# Patient Record
Sex: Male | Born: 1945
Health system: Southern US, Community
[De-identification: ages and names within clinical notes are randomized; demographics above are authoritative.]

## PROBLEM LIST (undated history)

## (undated) DIAGNOSIS — M1990 Unspecified osteoarthritis, unspecified site: Secondary | ICD-10-CM

## (undated) DIAGNOSIS — K219 Gastro-esophageal reflux disease without esophagitis: Secondary | ICD-10-CM

## (undated) DIAGNOSIS — T4145XA Adverse effect of unspecified anesthetic, initial encounter: Secondary | ICD-10-CM

## (undated) DIAGNOSIS — I480 Paroxysmal atrial fibrillation: Secondary | ICD-10-CM

## (undated) DIAGNOSIS — I2510 Atherosclerotic heart disease of native coronary artery without angina pectoris: Secondary | ICD-10-CM

## (undated) DIAGNOSIS — G4733 Obstructive sleep apnea (adult) (pediatric): Secondary | ICD-10-CM

## (undated) DIAGNOSIS — I420 Dilated cardiomyopathy: Secondary | ICD-10-CM

## (undated) DIAGNOSIS — R9389 Abnormal findings on diagnostic imaging of other specified body structures: Secondary | ICD-10-CM

## (undated) DIAGNOSIS — E785 Hyperlipidemia, unspecified: Secondary | ICD-10-CM

## (undated) DIAGNOSIS — D649 Anemia, unspecified: Secondary | ICD-10-CM

## (undated) DIAGNOSIS — T8859XA Other complications of anesthesia, initial encounter: Secondary | ICD-10-CM

## (undated) DIAGNOSIS — R0600 Dyspnea, unspecified: Secondary | ICD-10-CM

## (undated) DIAGNOSIS — I509 Heart failure, unspecified: Secondary | ICD-10-CM

## (undated) DIAGNOSIS — I499 Cardiac arrhythmia, unspecified: Secondary | ICD-10-CM

## (undated) DIAGNOSIS — H409 Unspecified glaucoma: Secondary | ICD-10-CM

## (undated) HISTORY — DX: Paroxysmal atrial fibrillation: I48.0

## (undated) HISTORY — PX: RETINAL DETACHMENT SURGERY: SHX105

## (undated) HISTORY — DX: Atherosclerotic heart disease of native coronary artery without angina pectoris: I25.10

## (undated) HISTORY — DX: Hyperlipidemia, unspecified: E78.5

## (undated) HISTORY — PX: VASECTOMY: SHX75

## (undated) HISTORY — PX: EYE SURGERY: SHX253

## (undated) HISTORY — PX: KNEE ARTHROSCOPY W/ DEBRIDEMENT: SHX1867

## (undated) HISTORY — DX: Obstructive sleep apnea (adult) (pediatric): G47.33

## (undated) HISTORY — DX: Abnormal findings on diagnostic imaging of other specified body structures: R93.89

## (undated) HISTORY — DX: Dilated cardiomyopathy: I42.0

---

## 1998-06-18 ENCOUNTER — Ambulatory Visit: Admission: RE | Admit: 1998-06-18 | Discharge: 1998-06-18 | Payer: Self-pay | Admitting: Family Medicine

## 2000-01-11 ENCOUNTER — Ambulatory Visit (HOSPITAL_COMMUNITY): Admission: RE | Admit: 2000-01-11 | Discharge: 2000-01-11 | Payer: Self-pay | Admitting: Family Medicine

## 2000-06-21 ENCOUNTER — Other Ambulatory Visit: Admission: RE | Admit: 2000-06-21 | Discharge: 2000-06-21 | Payer: Self-pay | Admitting: Urology

## 2002-02-06 ENCOUNTER — Ambulatory Visit (HOSPITAL_COMMUNITY): Admission: RE | Admit: 2002-02-06 | Discharge: 2002-02-06 | Payer: Self-pay | Admitting: Family Medicine

## 2003-10-09 ENCOUNTER — Ambulatory Visit (HOSPITAL_COMMUNITY): Admission: RE | Admit: 2003-10-09 | Discharge: 2003-10-10 | Payer: Self-pay | Admitting: Ophthalmology

## 2003-10-18 ENCOUNTER — Ambulatory Visit (HOSPITAL_COMMUNITY): Admission: RE | Admit: 2003-10-18 | Discharge: 2003-10-20 | Payer: Self-pay | Admitting: Ophthalmology

## 2006-06-16 ENCOUNTER — Inpatient Hospital Stay (HOSPITAL_COMMUNITY): Admission: EM | Admit: 2006-06-16 | Discharge: 2006-06-19 | Payer: Self-pay | Admitting: Emergency Medicine

## 2011-11-24 DIAGNOSIS — Z961 Presence of intraocular lens: Secondary | ICD-10-CM | POA: Insufficient documentation

## 2011-11-24 DIAGNOSIS — H4010X0 Unspecified open-angle glaucoma, stage unspecified: Secondary | ICD-10-CM | POA: Insufficient documentation

## 2011-11-24 DIAGNOSIS — H33033 Retinal detachment with giant retinal tear, bilateral: Secondary | ICD-10-CM | POA: Insufficient documentation

## 2012-12-19 ENCOUNTER — Telehealth: Payer: Self-pay | Admitting: *Deleted

## 2012-12-19 NOTE — Telephone Encounter (Signed)
error 

## 2015-03-05 ENCOUNTER — Emergency Department (HOSPITAL_COMMUNITY): Payer: PPO

## 2015-03-05 ENCOUNTER — Encounter (HOSPITAL_COMMUNITY): Payer: Self-pay | Admitting: Emergency Medicine

## 2015-03-05 ENCOUNTER — Emergency Department (HOSPITAL_COMMUNITY)
Admission: EM | Admit: 2015-03-05 | Discharge: 2015-03-05 | Disposition: A | Discharge status: Home / Self Care | Payer: PPO | Attending: Emergency Medicine | Admitting: Emergency Medicine

## 2015-03-05 DIAGNOSIS — Z880 Allergy status to penicillin: Secondary | ICD-10-CM | POA: Diagnosis not present

## 2015-03-05 DIAGNOSIS — R5383 Other fatigue: Secondary | ICD-10-CM | POA: Insufficient documentation

## 2015-03-05 DIAGNOSIS — R42 Dizziness and giddiness: Secondary | ICD-10-CM | POA: Diagnosis present

## 2015-03-05 DIAGNOSIS — Z7951 Long term (current) use of inhaled steroids: Secondary | ICD-10-CM | POA: Insufficient documentation

## 2015-03-05 DIAGNOSIS — Z79899 Other long term (current) drug therapy: Secondary | ICD-10-CM | POA: Diagnosis not present

## 2015-03-05 DIAGNOSIS — N390 Urinary tract infection, site not specified: Secondary | ICD-10-CM | POA: Diagnosis not present

## 2015-03-05 DIAGNOSIS — Z7982 Long term (current) use of aspirin: Secondary | ICD-10-CM | POA: Insufficient documentation

## 2015-03-05 DIAGNOSIS — H409 Unspecified glaucoma: Secondary | ICD-10-CM | POA: Diagnosis not present

## 2015-03-05 HISTORY — DX: Unspecified glaucoma: H40.9

## 2015-03-05 LAB — HEPATIC FUNCTION PANEL
ALT: 16 U/L — AB (ref 17–63)
AST: 22 U/L (ref 15–41)
Albumin: 3.8 g/dL (ref 3.5–5.0)
Alkaline Phosphatase: 63 U/L (ref 38–126)
BILIRUBIN DIRECT: 0.2 mg/dL (ref 0.1–0.5)
BILIRUBIN INDIRECT: 0.6 mg/dL (ref 0.3–0.9)
BILIRUBIN TOTAL: 0.8 mg/dL (ref 0.3–1.2)
Total Protein: 6.9 g/dL (ref 6.5–8.1)

## 2015-03-05 LAB — URINALYSIS, ROUTINE W REFLEX MICROSCOPIC
BILIRUBIN URINE: NEGATIVE
Glucose, UA: NEGATIVE mg/dL
KETONES UR: NEGATIVE mg/dL
Nitrite: NEGATIVE
PH: 5.5 (ref 5.0–8.0)
Protein, ur: NEGATIVE mg/dL
SPECIFIC GRAVITY, URINE: 1.02 (ref 1.005–1.030)
UROBILINOGEN UA: 0.2 mg/dL (ref 0.0–1.0)

## 2015-03-05 LAB — CBC
HCT: 40.8 % (ref 39.0–52.0)
HEMOGLOBIN: 13.5 g/dL (ref 13.0–17.0)
MCH: 29.9 pg (ref 26.0–34.0)
MCHC: 33.1 g/dL (ref 30.0–36.0)
MCV: 90.3 fL (ref 78.0–100.0)
PLATELETS: 208 10*3/uL (ref 150–400)
RBC: 4.52 MIL/uL (ref 4.22–5.81)
RDW: 13.9 % (ref 11.5–15.5)
WBC: 16.3 10*3/uL — ABNORMAL HIGH (ref 4.0–10.5)

## 2015-03-05 LAB — BASIC METABOLIC PANEL
ANION GAP: 7 (ref 5–15)
BUN: 21 mg/dL — ABNORMAL HIGH (ref 6–20)
CALCIUM: 8.9 mg/dL (ref 8.9–10.3)
CO2: 24 mmol/L (ref 22–32)
CREATININE: 0.96 mg/dL (ref 0.61–1.24)
Chloride: 104 mmol/L (ref 101–111)
Glucose, Bld: 108 mg/dL — ABNORMAL HIGH (ref 65–99)
Potassium: 4.1 mmol/L (ref 3.5–5.1)
SODIUM: 135 mmol/L (ref 135–145)

## 2015-03-05 LAB — I-STAT TROPONIN, ED
TROPONIN I, POC: 0 ng/mL (ref 0.00–0.08)
TROPONIN I, POC: 0 ng/mL (ref 0.00–0.08)
Troponin i, poc: 0 ng/mL (ref 0.00–0.08)

## 2015-03-05 LAB — URINE MICROSCOPIC-ADD ON

## 2015-03-05 LAB — LIPASE, BLOOD: Lipase: 21 U/L — ABNORMAL LOW (ref 22–51)

## 2015-03-05 MED ORDER — CEPHALEXIN 500 MG PO CAPS: 500.0000 mg | ORAL_CAPSULE | Freq: Four times a day (QID) | ORAL | Status: DC

## 2015-03-05 MED ORDER — SODIUM CHLORIDE 0.9 % IV SOLN
Freq: Once | INTRAVENOUS | Status: AC
  Administered 2015-03-05: 18:00:00 via INTRAVENOUS

## 2015-03-05 MED ORDER — SODIUM CHLORIDE 0.9 % IV BOLUS (SEPSIS)
500.0000 mL | Freq: Once | INTRAVENOUS | Status: AC
  Administered 2015-03-05: 500 mL via INTRAVENOUS

## 2015-03-05 MED ORDER — SODIUM CHLORIDE 0.9 % IV BOLUS (SEPSIS)
1000.0000 mL | Freq: Once | INTRAVENOUS | Status: AC
  Administered 2015-03-05: 1000 mL via INTRAVENOUS

## 2015-03-05 MED ORDER — IOHEXOL 350 MG/ML SOLN: 100.0000 mL | Freq: Once | INTRAVENOUS | Status: DC | PRN

## 2015-03-05 MED ORDER — DEXTROSE 5 % IV SOLN
1.0000 g | Freq: Once | INTRAVENOUS | Status: AC
  Administered 2015-03-05: 1 g via INTRAVENOUS
  Filled 2015-03-05: qty 10

## 2015-03-05 NOTE — ED Notes (Signed)
Patient transported to CT 

## 2015-03-05 NOTE — ED Notes (Signed)
Pt hypotensive 95/51 when measured in right arm, normal blood pressure 122/61 when measured in left arm. Pt denies cardiac history but states he has borderline hyperlipidemia. Pt had blood pressure measured at PCP today when he went in for evaluation of what he believed to be UTI symptoms: anxiety, dizziness, 100 degree F fever, shoulder aches, back pain.

## 2015-03-05 NOTE — Discharge Instructions (Signed)
Urinary Tract Infection Your urine was found to be infected.  Take the antibiotic as prescribed and follow up outpatient as soon as possible.  It is unclear what happened with your blood pressure today but it normalized on its own.  It did appear that you were dehydrated and improved with fluids.  Take your antibiotic, rest, and drink as much fluid as possible.  Return with worsening symptoms or new concerning symptoms.  Make a follow up appointment for tomorrow for reevaluation.  Urinary tract infections (UTIs) can develop anywhere along your urinary tract. Your urinary tract is your body's drainage system for removing wastes and extra water. Your urinary tract includes two kidneys, two ureters, a bladder, and a urethra. Your kidneys are a pair of bean-shaped organs. Each kidney is about the size of your fist. They are located below your ribs, one on each side of your spine. CAUSES Infections are caused by microbes, which are microscopic organisms, including fungi, viruses, and bacteria. These organisms are so small that they can only be seen through a microscope. Bacteria are the microbes that most commonly cause UTIs. SYMPTOMS  Symptoms of UTIs may vary by age and gender of the patient and by the location of the infection. Symptoms in young women typically include a frequent and intense urge to urinate and a painful, burning feeling in the bladder or urethra during urination. Older women and men are more likely to be tired, shaky, and weak and have muscle aches and abdominal pain. A fever may mean the infection is in your kidneys. Other symptoms of a kidney infection include pain in your back or sides below the ribs, nausea, and vomiting. DIAGNOSIS To diagnose a UTI, your caregiver will ask you about your symptoms. Your caregiver also will ask to provide a urine sample. The urine sample will be tested for bacteria and white blood cells. White blood cells are made by your body to help fight  infection. TREATMENT  Typically, UTIs can be treated with medication. Because most UTIs are caused by a bacterial infection, they usually can be treated with the use of antibiotics. The choice of antibiotic and length of treatment depend on your symptoms and the type of bacteria causing your infection. HOME CARE INSTRUCTIONS  If you were prescribed antibiotics, take them exactly as your caregiver instructs you. Finish the medication even if you feel better after you have only taken some of the medication.  Drink enough water and fluids to keep your urine clear or pale yellow.  Avoid caffeine, tea, and carbonated beverages. They tend to irritate your bladder.  Empty your bladder often. Avoid holding urine for long periods of time.  Empty your bladder before and after sexual intercourse.  After a bowel movement, women should cleanse from front to back. Use each tissue only once. SEEK MEDICAL CARE IF:   You have back pain.  You develop a fever.  Your symptoms do not begin to resolve within 3 days. SEEK IMMEDIATE MEDICAL CARE IF:   You have severe back pain or lower abdominal pain.  You develop chills.  You have nausea or vomiting.  You have continued burning or discomfort with urination. MAKE SURE YOU:   Understand these instructions.  Will watch your condition.  Will get help right away if you are not doing well or get worse. Document Released: 03/02/2005 Document Revised: 11/22/2011 Document Reviewed: 07/01/2011 [facility] Patient Information 2015 [place], [place]. This information is not intended to replace advice given to you by your health  care provider. Make sure you discuss any questions you have with your health care provider.

## 2015-03-05 NOTE — Progress Notes (Signed)
EDCm spoke to patient at bedside.  Patient confirms his pcp is Dr. Myriam Jacobson of [facility]  System updated.

## 2015-03-07 NOTE — ED Provider Notes (Signed)
CSN: [id number]     Arrival date & time 03/05/15  1525 History   First MD Initiated Contact with Patient 03/05/15 1619     Chief Complaint  Patient presents with  . Hypotension  . Dizziness     (Consider location/radiation/quality/duration/timing/severity/associated sxs/prior Treatment) HPI Comments: [name] with history of UTI presents for low blood pressure.  The patient reports that last night he was up during the night with mild discomfort in his lower back that he said feels like when he had urinary tract infections before.  He says that it was difficult to sleep because of this and that he went to his urologist's office to try to get treatment for possible UTI but was found to have a low blood pressure and was sent to the ER.  He reports lightheadedness today when he gets up to stand but denies fever.  No nausea or vomiting.  He denies flank pain.  No chest pain or shortness of breath.  No focal weakness or neurologic changes.  Patient is a 69 y.o. male presenting with dizziness.  Dizziness Associated symptoms: no chest pain, no diarrhea, no headaches, no nausea, no palpitations, no vomiting and no weakness     Past Medical History  Diagnosis Date  . Glaucoma    History reviewed. No pertinent past surgical history. History reviewed. No pertinent family history. Social History  Substance Use Topics  . Smoking status: Never Smoker   . Smokeless tobacco: None  . Alcohol Use: Yes     Comment: occasional    Review of Systems  Constitutional: Positive for fatigue. Negative for fever, chills and appetite change.  HENT: Negative for congestion, postnasal drip and rhinorrhea.   Eyes: Negative for pain and redness.  Respiratory: Negative for cough, chest tightness and wheezing.   Cardiovascular: Negative for chest pain and palpitations.  Gastrointestinal: Negative for nausea, vomiting, abdominal pain and diarrhea.  Genitourinary: Negative for dysuria, urgency and hematuria.   Musculoskeletal: Negative for myalgias, back pain and joint swelling.  Skin: Negative for rash.  Neurological: Positive for light-headedness. Negative for dizziness, seizures, speech difficulty, weakness, numbness and headaches.  Hematological: Does not bruise/bleed easily.      Allergies  Penicillins  Home Medications   Prior to Admission medications   Medication Sig Start Date End Date Taking? Authorizing Jona Erkkila  acetaminophen (TYLENOL) 500 MG tablet Take 1,000 mg by mouth every 6 (six) hours as needed for mild pain, moderate pain, fever or headache.   Yes Historical Maisyn Nouri, MD  aspirin 81 MG tablet Take 81 mg by mouth daily with breakfast.   Yes Historical Donnika Kucher, MD  brimonidine (ALPHAGAN) 0.15 % ophthalmic solution Place 1 drop into the right eye 2 (two) times daily.   Yes Historical Xandrea Clarey, MD  calcium carbonate (OS-CAL) 600 MG tablet Take 600 mg by mouth every evening.   Yes Historical Lenay Lovejoy, MD  cetirizine (ZYRTEC) 10 MG tablet Take 10 mg by mouth every evening.   Yes Historical Sanford Lindblad, MD  Cyanocobalamin (VITAMIN B-12 IJ) Inject as directed every 14 (fourteen) days.   Yes Historical Raiana Pharris, MD  ferrous sulfate 325 (65 FE) MG tablet Take 325 mg by mouth daily with breakfast.   Yes Historical Maddix Kliewer, MD  fluticasone (FLONASE) 50 MCG/ACT nasal spray Place 2 sprays into both nostrils at bedtime.   Yes Historical Jasmin Winberry, MD  Glucosamine 750 MG TABS Take 1 tablet by mouth 2 (two) times daily.   Yes Historical Kingson Lohmeyer, MD  ketorolac (ACULAR) 0.4 % SOLN  Place 1 drop into both eyes at bedtime.   Yes Historical Christia Coaxum, MD  Multiple Vitamins-Minerals (CENTRUM SILVER ADULT 50+) TABS Take 1 tablet by mouth daily with breakfast.   Yes Historical Jazae Gandolfi, MD  tamsulosin (FLOMAX) 0.4 MG CAPS capsule Take 0.4 mg by mouth daily after supper.   Yes Historical Marlyn Tondreau, MD  cephALEXin (KEFLEX) 500 MG capsule Take 1 capsule (500 mg total) by mouth 4 (four) times daily. 03/05/15    Harvel Quale, MD   BP 119/61 mmHg  Pulse 89  Temp(Src) 97.8 F (36.6 C) (Oral)  Resp 16  Ht 6' (1.829 m)  Wt 265 lb (120.203 kg)  BMI 35.93 kg/m2  SpO2 95% Physical Exam  Constitutional: He is oriented to person, place, and time. He appears well-developed and well-nourished. No distress.  HENT:  Head: Normocephalic and atraumatic.  Right Ear: External ear normal.  Left Ear: External ear normal.  Mouth/Throat: Oropharynx is clear and moist. No oropharyngeal exudate.  Eyes: EOM are normal. Pupils are equal, round, and reactive to light.  Neck: Normal range of motion. Neck supple.  Cardiovascular: Normal rate, regular rhythm, normal heart sounds and intact distal pulses.   No murmur heard. Pulses:      Radial pulses are 2+ on the right side, and 2+ on the left side.  Pulmonary/Chest: Effort normal. No respiratory distress. He has no wheezes. He has no rales.  Abdominal: Soft. He exhibits no distension. There is no tenderness. There is no rebound.  Musculoskeletal: He exhibits no edema or tenderness.  Neurological: He is alert and oriented to person, place, and time. He has normal strength. No cranial nerve deficit or sensory deficit. He exhibits normal muscle tone. Coordination normal.  Skin: Skin is warm and dry. No rash noted. He is not diaphoretic.  Vitals reviewed.   ED Course  Procedures (including critical care time) Labs Review Labs Reviewed  BASIC METABOLIC PANEL - Abnormal; Notable for the following:    Glucose, Bld 108 (*)    BUN 21 (*)    All other components within normal limits  CBC - Abnormal; Notable for the following:    WBC 16.3 (*)    All other components within normal limits  LIPASE, BLOOD - Abnormal; Notable for the following:    Lipase 21 (*)    All other components within normal limits  HEPATIC FUNCTION PANEL - Abnormal; Notable for the following:    ALT 16 (*)    All other components within normal limits  URINALYSIS, ROUTINE W REFLEX  MICROSCOPIC (NOT AT [facility]) - Abnormal; Notable for the following:    Hgb urine dipstick TRACE (*)    Leukocytes, UA MODERATE (*)    All other components within normal limits  URINE MICROSCOPIC-ADD ON  I-STAT TROPOININ, ED  I-STAT TROPOININ, ED  I-STAT TROPOININ, ED    Imaging Review Ct Angio Abdomen W/cm &/or Wo Contrast  03/05/2015   CLINICAL DATA:  69 year old male with chronic wall blood pressure in the upper extremities. Anxiety and dizziness. 100 degree fever. Shoulder and back pain.  EXAM: CT ANGIOGRAPHY CHEST AND ABDOMEN  TECHNIQUE: Multidetector CT imaging of the chest and abdomen was performed using the standard protocol during bolus administration of intravenous contrast. Multiplanar CT image reconstructions and MIPs were obtained to evaluate the vascular anatomy.  CONTRAST:  100 mL of Omnipaque 350.  COMPARISON:  No priors.  FINDINGS: CTA CHEST FINDINGS  Mediastinum/Lymph Nodes: Heart size is normal. There is no significant pericardial fluid, thickening or  pericardial calcification. There is atherosclerosis of the thoracic aorta, the great vessels of the mediastinum and the coronary arteries, including calcified atherosclerotic plaque in the left main, left anterior descending and right coronary arteries. No acute abnormality of the thoracic aorta; specifically, no aneurysm or dissection. Additionally, there is no crescentic high attenuation associated with the wall of the thoracic aorta on precontrast images to suggest acute intramural hemorrhage. No mediastinal or hilar lymphadenopathy. Esophagus is unremarkable in appearance. No axillary lymphadenopathy.  Lungs/Pleura: Some dependent areas of subsegmental atelectasis and/or scarring are noted in the lower lobes of the lungs bilaterally. There is no acute consolidative airspace disease. No pleural effusions. No suspicious appearing pulmonary nodules or masses.  Musculoskeletal/Soft Tissues: There are no aggressive appearing lytic or blastic  lesions noted in the visualized portions of the skeleton.  Review of the MIP images confirms the above findings.  CTA ABDOMEN FINDINGS  Hepatobiliary: No cystic or solid hepatic lesions. No intra or extrahepatic biliary ductal dilatation. Gallbladder is normal in appearance.  Pancreas: No pancreatic mass. No pancreatic ductal dilatation. No pancreatic or peripancreatic fluid or inflammatory changes.  Spleen: Unremarkable.  Adrenals/Urinary Tract: Multifocal cortical thinning in the kidneys bilaterally (right greater than left), compatible with areas of post infectious or inflammatory scarring. Low-attenuation lesions in the kidneys bilaterally, compatible with simple cysts, the largest of which measures 3.8 cm in the lower pole of the left kidney. No suspicious renal lesions are noted. Bilateral adrenal glands are normal in appearance. No hydroureteronephrosis in the visualized abdomen.  Stomach/Bowel: Normal appearance of the stomach. No pathologic dilatation of the visualized portions of small bowel or colon. Normal appendix.  Vascular/Lymphatic: Atherosclerosis throughout the abdominal vasculature, without evidence of aneurysm or dissection.  Other: No significant volume of ascites in the visualized peritoneal cavity. No pneumoperitoneum.  Musculoskeletal: There are no aggressive appearing lytic or blastic lesions noted in the visualized portions of the skeleton.  Review of the MIP images confirms the above findings.  IMPRESSION: 1. No acute abnormality of the thoracoabdominal aorta. Specifically, no aneurysm or dissection. 2. Atherosclerosis, including left main and 2 vessel coronary artery disease. Please note that although the presence of coronary artery calcium documents the presence of coronary artery disease, the severity of this disease and any potential stenosis cannot be assessed on this non-gated CT examination. Assessment for potential risk factor modification, dietary therapy or pharmacologic therapy  may be warranted, if clinically indicated. 3. No acute findings in the chest or abdomen to account for the patient's symptoms. 4. Multifocal cortical thinning in the kidneys bilaterally (right greater than left), presumably areas of post infectious or inflammatory scarring. 5. Normal appendix. 6. Additional incidental findings, as above.   Electronically Signed   By: Vinnie Langton M.D.   On: 03/05/2015 18:34   Dg Chest Portable 1 View  03/05/2015   CLINICAL DATA:  Dizziness, anxiety and elevated blood pressure  EXAM: PORTABLE CHEST - 1 VIEW  COMPARISON:  10/09/03  FINDINGS: Cardiac shadow is within normal limits. The lungs are well aerated bilaterally. Some minimal atelectasis is noted in the lateral left base. No focal confluent infiltrate is seen. No bony abnormality is noted.  IMPRESSION: Mild left basilar atelectasis.   Electronically Signed   By: Inez Catalina M.D.   On: 03/05/2015 17:32   Ct Angio Chest Aorta W/cm &/or Wo/cm  03/05/2015   CLINICAL DATA:  69 year old male with chronic wall blood pressure in the upper extremities. Anxiety and dizziness. 100 degree fever. Shoulder and back  pain.  EXAM: CT ANGIOGRAPHY CHEST AND ABDOMEN  TECHNIQUE: Multidetector CT imaging of the chest and abdomen was performed using the standard protocol during bolus administration of intravenous contrast. Multiplanar CT image reconstructions and MIPs were obtained to evaluate the vascular anatomy.  CONTRAST:  100 mL of Omnipaque 350.  COMPARISON:  No priors.  FINDINGS: CTA CHEST FINDINGS  Mediastinum/Lymph Nodes: Heart size is normal. There is no significant pericardial fluid, thickening or pericardial calcification. There is atherosclerosis of the thoracic aorta, the great vessels of the mediastinum and the coronary arteries, including calcified atherosclerotic plaque in the left main, left anterior descending and right coronary arteries. No acute abnormality of the thoracic aorta; specifically, no aneurysm or dissection.  Additionally, there is no crescentic high attenuation associated with the wall of the thoracic aorta on precontrast images to suggest acute intramural hemorrhage. No mediastinal or hilar lymphadenopathy. Esophagus is unremarkable in appearance. No axillary lymphadenopathy.  Lungs/Pleura: Some dependent areas of subsegmental atelectasis and/or scarring are noted in the lower lobes of the lungs bilaterally. There is no acute consolidative airspace disease. No pleural effusions. No suspicious appearing pulmonary nodules or masses.  Musculoskeletal/Soft Tissues: There are no aggressive appearing lytic or blastic lesions noted in the visualized portions of the skeleton.  Review of the MIP images confirms the above findings.  CTA ABDOMEN FINDINGS  Hepatobiliary: No cystic or solid hepatic lesions. No intra or extrahepatic biliary ductal dilatation. Gallbladder is normal in appearance.  Pancreas: No pancreatic mass. No pancreatic ductal dilatation. No pancreatic or peripancreatic fluid or inflammatory changes.  Spleen: Unremarkable.  Adrenals/Urinary Tract: Multifocal cortical thinning in the kidneys bilaterally (right greater than left), compatible with areas of post infectious or inflammatory scarring. Low-attenuation lesions in the kidneys bilaterally, compatible with simple cysts, the largest of which measures 3.8 cm in the lower pole of the left kidney. No suspicious renal lesions are noted. Bilateral adrenal glands are normal in appearance. No hydroureteronephrosis in the visualized abdomen.  Stomach/Bowel: Normal appearance of the stomach. No pathologic dilatation of the visualized portions of small bowel or colon. Normal appendix.  Vascular/Lymphatic: Atherosclerosis throughout the abdominal vasculature, without evidence of aneurysm or dissection.  Other: No significant volume of ascites in the visualized peritoneal cavity. No pneumoperitoneum.  Musculoskeletal: There are no aggressive appearing lytic or blastic  lesions noted in the visualized portions of the skeleton.  Review of the MIP images confirms the above findings.  IMPRESSION: 1. No acute abnormality of the thoracoabdominal aorta. Specifically, no aneurysm or dissection. 2. Atherosclerosis, including left main and 2 vessel coronary artery disease. Please note that although the presence of coronary artery calcium documents the presence of coronary artery disease, the severity of this disease and any potential stenosis cannot be assessed on this non-gated CT examination. Assessment for potential risk factor modification, dietary therapy or pharmacologic therapy may be warranted, if clinically indicated. 3. No acute findings in the chest or abdomen to account for the patient's symptoms. 4. Multifocal cortical thinning in the kidneys bilaterally (right greater than left), presumably areas of post infectious or inflammatory scarring. 5. Normal appendix. 6. Additional incidental findings, as above.   Electronically Signed   By: Vinnie Langton M.D.   On: 03/05/2015 18:34   I have personally reviewed and evaluated these images and lab results as part of my medical decision-making.   EKG Interpretation   Date/Time:  Thursday March 05 2015 15:43:29 EDT Ventricular Rate:  72 PR Interval:  187 QRS Duration: 99 QT Interval:  382  QTC Calculation: 418 R Axis:   -36 Text Interpretation:  Sinus rhythm Left axis deviation Low voltage,  precordial leads No significant change since last tracing Confirmed by  NGUYEN, EMILY ([id number]) on 03/05/2015 4:33:04 PM      MDM  Patient was seen and evaluated in stable condition.  BP normalized on its own although noted to have asymmetric blood pressure in triage and be complaining of light headedness and back discomfort that kept him from sleeping well last night.  CTA negative for dissection/aneurysm but some degree of CAD noted - patient denied chest pain.  EKG and troponin x2 unremarkable.  Laboratory studies revealed  leukocytosis and UTI.  Otherwise unremarkable.  Patient given fluids for hydration and Rocephin for UTI.  After fluids patient was stood and ambulated without difficulty and denied lightheadedness.  Blood pressure remained stable and heart rate went to max of 90s when the patient stood.  Discussed all results and clinical impression with patient and wife at bedside.  Both felt comfortable with plan for discharge on antibiotics and outpatient follow up.  Patient instructed to hydrate well and to return immediately with new concerning symptoms or worsening symptoms.  All questions answered prior to discharge. Final diagnoses:  UTI (lower urinary tract infection)    1. UTI    Harvel Quale, MD 03/07/15 1555

## 2015-03-18 ENCOUNTER — Telehealth: Payer: Self-pay | Admitting: Cardiovascular Disease

## 2015-03-18 NOTE — Telephone Encounter (Signed)
Received records from [place] for appointment on 05/05/15 with Dr Gwenlyn Found.  Records given to [facility] (medical records) for Dr Kennon Holter schedule on 05/05/15. lp

## 2015-05-05 ENCOUNTER — Encounter: Payer: Self-pay | Admitting: Cardiovascular Disease

## 2015-05-05 ENCOUNTER — Ambulatory Visit (INDEPENDENT_AMBULATORY_CARE_PROVIDER_SITE_OTHER): Payer: PPO | Admitting: Cardiovascular Disease

## 2015-05-05 VITALS — BP 136/82 | HR 63 | Ht 72.0 in | Wt 264.0 lb

## 2015-05-05 DIAGNOSIS — R931 Abnormal findings on diagnostic imaging of heart and coronary circulation: Secondary | ICD-10-CM | POA: Insufficient documentation

## 2015-05-05 DIAGNOSIS — E785 Hyperlipidemia, unspecified: Secondary | ICD-10-CM | POA: Insufficient documentation

## 2015-05-05 DIAGNOSIS — R938 Abnormal findings on diagnostic imaging of other specified body structures: Secondary | ICD-10-CM | POA: Diagnosis not present

## 2015-05-05 NOTE — Patient Instructions (Signed)
Medication Instructions:  Your physician recommends that you continue on your current medications as directed. Please refer to the Current Medication list given to you today.   Labwork: none  Testing/Procedures: none  Follow-Up: Follow up with Dr. Berry as needed.   Any Other Special Instructions Will Be Listed Below (If Applicable).     If you need a refill on your cardiac medications before your next appointment, please call your pharmacy.   

## 2015-05-05 NOTE — Assessment & Plan Note (Signed)
[name]  had incidentally noted calcium in the left main and RCA found on chest CT imaging done for other reasons.his only cardiac risk factor is mild hyperlipidemia. He is totally asymptomatic. At this point I do not feel he needs functional testing but will see him back as needed should he develop other symptoms and I would have a low threshold to perform functional testing at that time

## 2015-05-05 NOTE — Progress Notes (Signed)
05/05/2015 Joshua Moran   May 10, 1946  [id number]  Primary Physician  Melinda Crutch, MD Primary Cardiologist: Lorretta Harp MD Renae Gloss   HPI:  Joshua Moran is a 69 year old moderately overweight married Caucasian male father of 3 children, grandfather of 3 grandchildren referred by Dr. Melinda Crutch for cardiovascular evaluation because of coronary calcium found on chest CT done for other reasons. He is retired Teacher, English as a foreign language for [employer]. His only cardiac risk factor is mild hyperlipidemia with statin intolerance. There is no family history. He has never smoked. He has never had a heart attack or stroke. He denies chest pain but does have mild dyspnea on exertion probably related to poor exercise tolerance from inactivity. He was seen at the hospital during a UTI and has apparently had asymmetric upper extremity blood pressures. A CT scan done of his chest suggested that he had calcium in his left main and RCA.   Current Outpatient Prescriptions  Medication Sig Dispense Refill  . acetaminophen (TYLENOL) 500 MG tablet Take 1,000 mg by mouth every 6 (six) hours as needed for mild pain, moderate pain, fever or headache.    Marland Kitchen aspirin 81 MG tablet Take 81 mg by mouth daily with breakfast.    . brimonidine (ALPHAGAN) 0.15 % ophthalmic solution Place 1 drop into the right eye 2 (two) times daily.    . calcium carbonate (OS-CAL) 600 MG tablet Take 600 mg by mouth every evening.    . cetirizine (ZYRTEC) 10 MG tablet Take 10 mg by mouth every evening.    . Cyanocobalamin (VITAMIN B-12 IJ) Inject as directed every 14 (fourteen) days.    . ferrous sulfate 325 (65 FE) MG tablet Take 325 mg by mouth daily with breakfast.    . fluticasone (FLONASE) 50 MCG/ACT nasal spray Place 2 sprays into both nostrils at bedtime.    . Glucosamine 750 MG TABS Take 1 tablet by mouth 2 (two) times daily.    Marland Kitchen ketorolac (ACULAR) 0.4 % SOLN Place 1 drop into both eyes at bedtime.    . Multiple  Vitamins-Minerals (CENTRUM SILVER ADULT 50+) TABS Take 1 tablet by mouth daily with breakfast.    . tamsulosin (FLOMAX) 0.4 MG CAPS capsule Take 0.4 mg by mouth daily after supper.     No current facility-administered medications for this visit.    Allergies  Allergen Reactions  . Penicillins Other (See Comments)    Very flushed  Has patient had a PCN reaction causing immediate rash, facial/tongue/throat swelling, SOB or lightheadedness with hypotension: No Has patient had a PCN reaction causing severe rash involving mucus membranes or skin necrosis: No Has patient had a PCN reaction that required hospitalization No Has patient had a PCN reaction occurring within the last 10 years: No If all of the above answers are "NO", then may proceed with Cephalosporin use.     Social History   Social History  . Marital Status: Married    Spouse Name: N/A  . Number of Children: N/A  . Years of Education: N/A   Occupational History  . Not on file.   Social History Main Topics  . Smoking status: Never Smoker   . Smokeless tobacco: Not on file  . Alcohol Use: Yes     Comment: occasional  . Drug Use: No  . Sexual Activity: Not on file   Other Topics Concern  . Not on file   Social History Narrative     Review of Systems: General: negative  for chills, fever, night sweats or weight changes.  Cardiovascular: negative for chest pain, dyspnea on exertion, edema, orthopnea, palpitations, paroxysmal nocturnal dyspnea or shortness of breath Dermatological: negative for rash Respiratory: negative for cough or wheezing Urologic: negative for hematuria Abdominal: negative for nausea, vomiting, diarrhea, bright red blood per rectum, melena, or hematemesis Neurologic: negative for visual changes, syncope, or dizziness All other systems reviewed and are otherwise negative except as noted above.    Blood pressure 136/82, pulse 63, height 6' (1.829 m), weight 264 lb (119.75 kg).  General  appearance: alert and no distress Neck: no adenopathy, no carotid bruit, no JVD, supple, symmetrical, trachea midline and thyroid not enlarged, symmetric, no tenderness/mass/nodules Lungs: clear to auscultation bilaterally Heart: regular rate and rhythm, S1, S2 normal, no murmur, click, rub or gallop Extremities: extremities normal, atraumatic, no cyanosis or edema    EKG: Not performed today   ASSESSMENT AND PLAN:   Hyperlipidemia History of mild hyperlipidemia intolerant to statin therapy followed by his PCP  Abnormal CT scan of heart Mr. [name]  had incidentally noted calcium in the left main and RCA found on chest CT imaging done for other reasons.his only cardiac risk factor is mild hyperlipidemia. He is totally asymptomatic. At this point I do not feel he needs functional testing but will see him back as needed should he develop other symptoms and I would have a low threshold to perform functional testing at that time      Lorretta Harp MD [facility], [facility] 05/05/2015 10:36 AM

## 2015-05-05 NOTE — Assessment & Plan Note (Signed)
History of mild hyperlipidemia intolerant to statin therapy followed by his PCP

## 2015-05-07 ENCOUNTER — Telehealth: Payer: Self-pay

## 2015-05-07 DIAGNOSIS — R9389 Abnormal findings on diagnostic imaging of other specified body structures: Secondary | ICD-10-CM

## 2015-05-07 NOTE — Telephone Encounter (Signed)
Left message for patient to call back.   Dr. Gwenlyn Found would like pt to have an exercise Myoview to rule out CAD.  He would like to do this test to make sure that the coronary calcium seen is not obstructive to your heart. In meantime keep with risk prevention like diet and exercise.  Orders in system for Myoview.

## 2015-05-08 NOTE — Telephone Encounter (Signed)
Pt agrees to have Myoview completed.   Pt would like after appointment when it is scheduled. He is aware that someone will be calling him to schedule.  No additional questions at this time.

## 2015-05-08 NOTE — Telephone Encounter (Signed)
Patient is returning your call from yesterday.

## 2015-05-15 ENCOUNTER — Telehealth (HOSPITAL_COMMUNITY): Payer: Self-pay

## 2015-05-15 NOTE — Telephone Encounter (Signed)
Encounter complete. 

## 2015-05-19 ENCOUNTER — Telehealth (HOSPITAL_COMMUNITY): Payer: Self-pay

## 2015-05-19 NOTE — Telephone Encounter (Signed)
Encounter complete. 

## 2015-05-20 ENCOUNTER — Ambulatory Visit (HOSPITAL_COMMUNITY)
Admission: RE | Admit: 2015-05-20 | Discharge: 2015-05-20 | Disposition: A | Discharge status: Home / Self Care | Payer: PPO | Source: Ambulatory Visit | Attending: Cardiology | Admitting: Cardiology

## 2015-05-20 DIAGNOSIS — Z8249 Family history of ischemic heart disease and other diseases of the circulatory system: Secondary | ICD-10-CM | POA: Insufficient documentation

## 2015-05-20 DIAGNOSIS — R0609 Other forms of dyspnea: Secondary | ICD-10-CM | POA: Insufficient documentation

## 2015-05-20 DIAGNOSIS — R938 Abnormal findings on diagnostic imaging of other specified body structures: Secondary | ICD-10-CM | POA: Insufficient documentation

## 2015-05-20 DIAGNOSIS — R9439 Abnormal result of other cardiovascular function study: Secondary | ICD-10-CM | POA: Insufficient documentation

## 2015-05-20 DIAGNOSIS — E669 Obesity, unspecified: Secondary | ICD-10-CM | POA: Insufficient documentation

## 2015-05-20 DIAGNOSIS — R9389 Abnormal findings on diagnostic imaging of other specified body structures: Secondary | ICD-10-CM

## 2015-05-20 DIAGNOSIS — Z6835 Body mass index (BMI) 35.0-35.9, adult: Secondary | ICD-10-CM | POA: Insufficient documentation

## 2015-05-20 LAB — MYOCARDIAL PERFUSION IMAGING
CHL CUP MPHR: 150 {beats}/min
CHL CUP NUCLEAR SDS: 0
CHL CUP NUCLEAR SRS: 9
CHL CUP RESTING HR STRESS: 77 {beats}/min
CHL RATE OF PERCEIVED EXERTION: 15
CSEPEW: 7 METS
CSEPHR: 94 %
Exercise duration (min): 5 min
LV dias vol: 157 mL
LV sys vol: 77 mL
Peak HR: 141 {beats}/min
SSS: 9
TID: 1.09

## 2015-05-20 MED ORDER — TECHNETIUM TC 99M SESTAMIBI GENERIC - CARDIOLITE
31.5000 | Freq: Once | INTRAVENOUS | Status: AC | PRN
  Administered 2015-05-20: 31.5 via INTRAVENOUS

## 2015-05-20 MED ORDER — TECHNETIUM TC 99M SESTAMIBI GENERIC - CARDIOLITE
10.9000 | Freq: Once | INTRAVENOUS | Status: AC | PRN
  Administered 2015-05-20: 10.9 via INTRAVENOUS

## 2015-06-10 DIAGNOSIS — H401113 Primary open-angle glaucoma, right eye, severe stage: Secondary | ICD-10-CM | POA: Diagnosis not present

## 2015-06-10 DIAGNOSIS — H401114 Primary open-angle glaucoma, right eye, indeterminate stage: Secondary | ICD-10-CM | POA: Insufficient documentation

## 2015-06-10 DIAGNOSIS — Z961 Presence of intraocular lens: Secondary | ICD-10-CM | POA: Diagnosis not present

## 2015-06-17 DIAGNOSIS — Z86010 Personal history of colonic polyps: Secondary | ICD-10-CM | POA: Diagnosis not present

## 2015-06-24 DIAGNOSIS — D649 Anemia, unspecified: Secondary | ICD-10-CM | POA: Diagnosis not present

## 2015-06-24 DIAGNOSIS — Z0000 Encounter for general adult medical examination without abnormal findings: Secondary | ICD-10-CM | POA: Diagnosis not present

## 2015-06-24 DIAGNOSIS — E7800 Pure hypercholesterolemia, unspecified: Secondary | ICD-10-CM | POA: Diagnosis not present

## 2015-06-24 DIAGNOSIS — J309 Allergic rhinitis, unspecified: Secondary | ICD-10-CM | POA: Diagnosis not present

## 2015-06-24 DIAGNOSIS — Z79899 Other long term (current) drug therapy: Secondary | ICD-10-CM | POA: Diagnosis not present

## 2015-06-24 DIAGNOSIS — N529 Male erectile dysfunction, unspecified: Secondary | ICD-10-CM | POA: Diagnosis not present

## 2015-06-24 DIAGNOSIS — M179 Osteoarthritis of knee, unspecified: Secondary | ICD-10-CM | POA: Diagnosis not present

## 2015-06-30 DIAGNOSIS — D649 Anemia, unspecified: Secondary | ICD-10-CM | POA: Diagnosis not present

## 2015-06-30 DIAGNOSIS — M179 Osteoarthritis of knee, unspecified: Secondary | ICD-10-CM | POA: Diagnosis not present

## 2015-06-30 DIAGNOSIS — Z79899 Other long term (current) drug therapy: Secondary | ICD-10-CM | POA: Diagnosis not present

## 2015-06-30 DIAGNOSIS — E7800 Pure hypercholesterolemia, unspecified: Secondary | ICD-10-CM | POA: Diagnosis not present

## 2015-06-30 DIAGNOSIS — J309 Allergic rhinitis, unspecified: Secondary | ICD-10-CM | POA: Diagnosis not present

## 2015-06-30 DIAGNOSIS — Z0000 Encounter for general adult medical examination without abnormal findings: Secondary | ICD-10-CM | POA: Diagnosis not present

## 2015-07-27 DIAGNOSIS — N3020 Other chronic cystitis without hematuria: Secondary | ICD-10-CM | POA: Diagnosis not present

## 2015-07-27 DIAGNOSIS — N401 Enlarged prostate with lower urinary tract symptoms: Secondary | ICD-10-CM | POA: Diagnosis not present

## 2015-07-27 DIAGNOSIS — N138 Other obstructive and reflux uropathy: Secondary | ICD-10-CM | POA: Diagnosis not present

## 2015-07-27 DIAGNOSIS — Z0000 Encounter for general adult medical examination without abnormal findings: Secondary | ICD-10-CM | POA: Diagnosis not present

## 2015-07-27 DIAGNOSIS — N3941 Urge incontinence: Secondary | ICD-10-CM | POA: Diagnosis not present

## 2015-08-17 DIAGNOSIS — N3281 Overactive bladder: Secondary | ICD-10-CM | POA: Diagnosis not present

## 2015-08-17 DIAGNOSIS — Z0000 Encounter for general adult medical examination without abnormal findings: Secondary | ICD-10-CM | POA: Diagnosis not present

## 2015-08-17 DIAGNOSIS — R3915 Urgency of urination: Secondary | ICD-10-CM | POA: Diagnosis not present

## 2015-08-17 DIAGNOSIS — N3941 Urge incontinence: Secondary | ICD-10-CM | POA: Diagnosis not present

## 2015-09-04 DIAGNOSIS — B999 Unspecified infectious disease: Secondary | ICD-10-CM | POA: Diagnosis not present

## 2015-09-04 DIAGNOSIS — L600 Ingrowing nail: Secondary | ICD-10-CM | POA: Diagnosis not present

## 2015-09-07 DIAGNOSIS — N138 Other obstructive and reflux uropathy: Secondary | ICD-10-CM | POA: Diagnosis not present

## 2015-09-07 DIAGNOSIS — N401 Enlarged prostate with lower urinary tract symptoms: Secondary | ICD-10-CM | POA: Diagnosis not present

## 2015-09-07 DIAGNOSIS — N3941 Urge incontinence: Secondary | ICD-10-CM | POA: Diagnosis not present

## 2015-09-07 DIAGNOSIS — Z0000 Encounter for general adult medical examination without abnormal findings: Secondary | ICD-10-CM | POA: Diagnosis not present

## 2015-09-07 DIAGNOSIS — N1370 Vesicoureteral-reflux, unspecified: Secondary | ICD-10-CM | POA: Diagnosis not present

## 2015-09-25 DIAGNOSIS — M79674 Pain in right toe(s): Secondary | ICD-10-CM | POA: Diagnosis not present

## 2015-09-25 DIAGNOSIS — L03031 Cellulitis of right toe: Secondary | ICD-10-CM | POA: Diagnosis not present

## 2015-10-09 DIAGNOSIS — M79675 Pain in left toe(s): Secondary | ICD-10-CM | POA: Diagnosis not present

## 2015-10-09 DIAGNOSIS — M79674 Pain in right toe(s): Secondary | ICD-10-CM | POA: Diagnosis not present

## 2015-10-09 DIAGNOSIS — B351 Tinea unguium: Secondary | ICD-10-CM | POA: Diagnosis not present

## 2015-10-13 DIAGNOSIS — N1370 Vesicoureteral-reflux, unspecified: Secondary | ICD-10-CM | POA: Diagnosis not present

## 2015-10-13 DIAGNOSIS — N390 Urinary tract infection, site not specified: Secondary | ICD-10-CM | POA: Diagnosis not present

## 2015-10-13 DIAGNOSIS — B9620 Unspecified Escherichia coli [E. coli] as the cause of diseases classified elsewhere: Secondary | ICD-10-CM | POA: Diagnosis not present

## 2015-10-13 DIAGNOSIS — N3020 Other chronic cystitis without hematuria: Secondary | ICD-10-CM | POA: Diagnosis not present

## 2015-10-13 DIAGNOSIS — Z0000 Encounter for general adult medical examination without abnormal findings: Secondary | ICD-10-CM | POA: Diagnosis not present

## 2015-10-28 DIAGNOSIS — M170 Bilateral primary osteoarthritis of knee: Secondary | ICD-10-CM | POA: Diagnosis not present

## 2015-10-28 DIAGNOSIS — N3020 Other chronic cystitis without hematuria: Secondary | ICD-10-CM | POA: Diagnosis not present

## 2015-10-30 DIAGNOSIS — N390 Urinary tract infection, site not specified: Secondary | ICD-10-CM | POA: Diagnosis not present

## 2015-11-04 DIAGNOSIS — M170 Bilateral primary osteoarthritis of knee: Secondary | ICD-10-CM | POA: Diagnosis not present

## 2015-11-06 DIAGNOSIS — M79674 Pain in right toe(s): Secondary | ICD-10-CM | POA: Diagnosis not present

## 2015-11-06 DIAGNOSIS — M79675 Pain in left toe(s): Secondary | ICD-10-CM | POA: Diagnosis not present

## 2015-11-06 DIAGNOSIS — B351 Tinea unguium: Secondary | ICD-10-CM | POA: Diagnosis not present

## 2015-11-10 DIAGNOSIS — M170 Bilateral primary osteoarthritis of knee: Secondary | ICD-10-CM | POA: Diagnosis not present

## 2015-12-04 DIAGNOSIS — M79674 Pain in right toe(s): Secondary | ICD-10-CM | POA: Diagnosis not present

## 2015-12-04 DIAGNOSIS — B351 Tinea unguium: Secondary | ICD-10-CM | POA: Diagnosis not present

## 2015-12-04 DIAGNOSIS — M79675 Pain in left toe(s): Secondary | ICD-10-CM | POA: Diagnosis not present

## 2015-12-17 DIAGNOSIS — N401 Enlarged prostate with lower urinary tract symptoms: Secondary | ICD-10-CM | POA: Diagnosis not present

## 2015-12-23 DIAGNOSIS — N5201 Erectile dysfunction due to arterial insufficiency: Secondary | ICD-10-CM | POA: Diagnosis not present

## 2015-12-23 DIAGNOSIS — R972 Elevated prostate specific antigen [PSA]: Secondary | ICD-10-CM | POA: Diagnosis not present

## 2015-12-23 DIAGNOSIS — N401 Enlarged prostate with lower urinary tract symptoms: Secondary | ICD-10-CM | POA: Diagnosis not present

## 2015-12-23 DIAGNOSIS — R3915 Urgency of urination: Secondary | ICD-10-CM | POA: Diagnosis not present

## 2016-01-01 DIAGNOSIS — M79674 Pain in right toe(s): Secondary | ICD-10-CM | POA: Diagnosis not present

## 2016-01-01 DIAGNOSIS — M79675 Pain in left toe(s): Secondary | ICD-10-CM | POA: Diagnosis not present

## 2016-01-01 DIAGNOSIS — B351 Tinea unguium: Secondary | ICD-10-CM | POA: Diagnosis not present

## 2016-02-25 DIAGNOSIS — Z961 Presence of intraocular lens: Secondary | ICD-10-CM | POA: Diagnosis not present

## 2016-02-25 DIAGNOSIS — H33033 Retinal detachment with giant retinal tear, bilateral: Secondary | ICD-10-CM | POA: Diagnosis not present

## 2016-02-25 DIAGNOSIS — H401114 Primary open-angle glaucoma, right eye, indeterminate stage: Secondary | ICD-10-CM | POA: Diagnosis not present

## 2016-03-24 DIAGNOSIS — R972 Elevated prostate specific antigen [PSA]: Secondary | ICD-10-CM | POA: Diagnosis not present

## 2016-03-30 DIAGNOSIS — R972 Elevated prostate specific antigen [PSA]: Secondary | ICD-10-CM | POA: Diagnosis not present

## 2016-03-30 DIAGNOSIS — N5201 Erectile dysfunction due to arterial insufficiency: Secondary | ICD-10-CM | POA: Diagnosis not present

## 2016-03-30 DIAGNOSIS — N3941 Urge incontinence: Secondary | ICD-10-CM | POA: Diagnosis not present

## 2016-03-30 DIAGNOSIS — N401 Enlarged prostate with lower urinary tract symptoms: Secondary | ICD-10-CM | POA: Diagnosis not present

## 2016-04-01 DIAGNOSIS — M79674 Pain in right toe(s): Secondary | ICD-10-CM | POA: Diagnosis not present

## 2016-04-01 DIAGNOSIS — M79675 Pain in left toe(s): Secondary | ICD-10-CM | POA: Diagnosis not present

## 2016-04-01 DIAGNOSIS — B351 Tinea unguium: Secondary | ICD-10-CM | POA: Diagnosis not present

## 2016-05-12 DIAGNOSIS — J329 Chronic sinusitis, unspecified: Secondary | ICD-10-CM | POA: Diagnosis not present

## 2016-05-13 DIAGNOSIS — H401113 Primary open-angle glaucoma, right eye, severe stage: Secondary | ICD-10-CM | POA: Diagnosis not present

## 2016-05-19 DIAGNOSIS — L814 Other melanin hyperpigmentation: Secondary | ICD-10-CM | POA: Diagnosis not present

## 2016-05-19 DIAGNOSIS — D225 Melanocytic nevi of trunk: Secondary | ICD-10-CM | POA: Diagnosis not present

## 2016-05-19 DIAGNOSIS — L821 Other seborrheic keratosis: Secondary | ICD-10-CM | POA: Diagnosis not present

## 2016-05-19 DIAGNOSIS — D1801 Hemangioma of skin and subcutaneous tissue: Secondary | ICD-10-CM | POA: Diagnosis not present

## 2016-05-26 DIAGNOSIS — M170 Bilateral primary osteoarthritis of knee: Secondary | ICD-10-CM | POA: Diagnosis not present

## 2016-05-26 DIAGNOSIS — M25561 Pain in right knee: Secondary | ICD-10-CM | POA: Diagnosis not present

## 2016-05-26 DIAGNOSIS — M25562 Pain in left knee: Secondary | ICD-10-CM | POA: Diagnosis not present

## 2016-06-02 DIAGNOSIS — M170 Bilateral primary osteoarthritis of knee: Secondary | ICD-10-CM | POA: Diagnosis not present

## 2016-06-09 DIAGNOSIS — M170 Bilateral primary osteoarthritis of knee: Secondary | ICD-10-CM | POA: Diagnosis not present

## 2016-07-11 DIAGNOSIS — Z79899 Other long term (current) drug therapy: Secondary | ICD-10-CM | POA: Diagnosis not present

## 2016-07-11 DIAGNOSIS — M179 Osteoarthritis of knee, unspecified: Secondary | ICD-10-CM | POA: Diagnosis not present

## 2016-07-11 DIAGNOSIS — J309 Allergic rhinitis, unspecified: Secondary | ICD-10-CM | POA: Diagnosis not present

## 2016-07-11 DIAGNOSIS — Z0000 Encounter for general adult medical examination without abnormal findings: Secondary | ICD-10-CM | POA: Diagnosis not present

## 2016-07-11 DIAGNOSIS — D649 Anemia, unspecified: Secondary | ICD-10-CM | POA: Diagnosis not present

## 2016-07-11 DIAGNOSIS — J329 Chronic sinusitis, unspecified: Secondary | ICD-10-CM | POA: Diagnosis not present

## 2016-07-11 DIAGNOSIS — E7800 Pure hypercholesterolemia, unspecified: Secondary | ICD-10-CM | POA: Diagnosis not present

## 2016-07-11 DIAGNOSIS — Z6841 Body Mass Index (BMI) 40.0 and over, adult: Secondary | ICD-10-CM | POA: Diagnosis not present

## 2016-07-11 DIAGNOSIS — N529 Male erectile dysfunction, unspecified: Secondary | ICD-10-CM | POA: Diagnosis not present

## 2016-07-29 DIAGNOSIS — Z961 Presence of intraocular lens: Secondary | ICD-10-CM | POA: Diagnosis not present

## 2016-07-29 DIAGNOSIS — H401113 Primary open-angle glaucoma, right eye, severe stage: Secondary | ICD-10-CM | POA: Diagnosis not present

## 2016-08-09 DIAGNOSIS — R350 Frequency of micturition: Secondary | ICD-10-CM | POA: Diagnosis not present

## 2016-09-14 DIAGNOSIS — H401113 Primary open-angle glaucoma, right eye, severe stage: Secondary | ICD-10-CM | POA: Diagnosis not present

## 2016-11-14 DIAGNOSIS — J209 Acute bronchitis, unspecified: Secondary | ICD-10-CM | POA: Diagnosis not present

## 2016-12-22 DIAGNOSIS — M170 Bilateral primary osteoarthritis of knee: Secondary | ICD-10-CM | POA: Diagnosis not present

## 2016-12-28 DIAGNOSIS — M170 Bilateral primary osteoarthritis of knee: Secondary | ICD-10-CM | POA: Diagnosis not present

## 2017-01-02 DIAGNOSIS — H401113 Primary open-angle glaucoma, right eye, severe stage: Secondary | ICD-10-CM | POA: Diagnosis not present

## 2017-01-02 DIAGNOSIS — H52203 Unspecified astigmatism, bilateral: Secondary | ICD-10-CM | POA: Diagnosis not present

## 2017-01-02 DIAGNOSIS — Z961 Presence of intraocular lens: Secondary | ICD-10-CM | POA: Diagnosis not present

## 2017-01-02 DIAGNOSIS — H5213 Myopia, bilateral: Secondary | ICD-10-CM | POA: Diagnosis not present

## 2017-01-05 DIAGNOSIS — M170 Bilateral primary osteoarthritis of knee: Secondary | ICD-10-CM | POA: Diagnosis not present

## 2017-01-23 DIAGNOSIS — L814 Other melanin hyperpigmentation: Secondary | ICD-10-CM | POA: Diagnosis not present

## 2017-01-23 DIAGNOSIS — L821 Other seborrheic keratosis: Secondary | ICD-10-CM | POA: Diagnosis not present

## 2017-01-23 DIAGNOSIS — L570 Actinic keratosis: Secondary | ICD-10-CM | POA: Diagnosis not present

## 2017-01-23 DIAGNOSIS — D225 Melanocytic nevi of trunk: Secondary | ICD-10-CM | POA: Diagnosis not present

## 2017-02-28 DIAGNOSIS — R5383 Other fatigue: Secondary | ICD-10-CM | POA: Diagnosis not present

## 2017-02-28 DIAGNOSIS — Z23 Encounter for immunization: Secondary | ICD-10-CM | POA: Diagnosis not present

## 2017-02-28 DIAGNOSIS — H6122 Impacted cerumen, left ear: Secondary | ICD-10-CM | POA: Diagnosis not present

## 2017-02-28 DIAGNOSIS — H6120 Impacted cerumen, unspecified ear: Secondary | ICD-10-CM | POA: Diagnosis not present

## 2017-02-28 DIAGNOSIS — R634 Abnormal weight loss: Secondary | ICD-10-CM | POA: Diagnosis not present

## 2017-02-28 DIAGNOSIS — R6882 Decreased libido: Secondary | ICD-10-CM | POA: Diagnosis not present

## 2017-02-28 DIAGNOSIS — D649 Anemia, unspecified: Secondary | ICD-10-CM | POA: Diagnosis not present

## 2017-03-16 DIAGNOSIS — Z961 Presence of intraocular lens: Secondary | ICD-10-CM | POA: Diagnosis not present

## 2017-03-16 DIAGNOSIS — H33033 Retinal detachment with giant retinal tear, bilateral: Secondary | ICD-10-CM | POA: Diagnosis not present

## 2017-03-16 DIAGNOSIS — H401114 Primary open-angle glaucoma, right eye, indeterminate stage: Secondary | ICD-10-CM | POA: Diagnosis not present

## 2017-04-05 DIAGNOSIS — R972 Elevated prostate specific antigen [PSA]: Secondary | ICD-10-CM | POA: Diagnosis not present

## 2017-04-12 DIAGNOSIS — N5201 Erectile dysfunction due to arterial insufficiency: Secondary | ICD-10-CM | POA: Diagnosis not present

## 2017-04-12 DIAGNOSIS — N401 Enlarged prostate with lower urinary tract symptoms: Secondary | ICD-10-CM | POA: Diagnosis not present

## 2017-04-12 DIAGNOSIS — N3281 Overactive bladder: Secondary | ICD-10-CM | POA: Diagnosis not present

## 2017-04-12 DIAGNOSIS — R972 Elevated prostate specific antigen [PSA]: Secondary | ICD-10-CM | POA: Diagnosis not present

## 2017-04-18 DIAGNOSIS — M170 Bilateral primary osteoarthritis of knee: Secondary | ICD-10-CM | POA: Diagnosis not present

## 2017-08-03 DIAGNOSIS — M170 Bilateral primary osteoarthritis of knee: Secondary | ICD-10-CM | POA: Diagnosis not present

## 2017-08-08 DIAGNOSIS — N529 Male erectile dysfunction, unspecified: Secondary | ICD-10-CM | POA: Diagnosis not present

## 2017-08-08 DIAGNOSIS — E7800 Pure hypercholesterolemia, unspecified: Secondary | ICD-10-CM | POA: Diagnosis not present

## 2017-08-08 DIAGNOSIS — Z0000 Encounter for general adult medical examination without abnormal findings: Secondary | ICD-10-CM | POA: Diagnosis not present

## 2017-08-08 DIAGNOSIS — D649 Anemia, unspecified: Secondary | ICD-10-CM | POA: Diagnosis not present

## 2017-08-08 DIAGNOSIS — R634 Abnormal weight loss: Secondary | ICD-10-CM | POA: Diagnosis not present

## 2017-08-08 DIAGNOSIS — J309 Allergic rhinitis, unspecified: Secondary | ICD-10-CM | POA: Diagnosis not present

## 2017-08-08 DIAGNOSIS — M179 Osteoarthritis of knee, unspecified: Secondary | ICD-10-CM | POA: Diagnosis not present

## 2017-08-08 DIAGNOSIS — Z79899 Other long term (current) drug therapy: Secondary | ICD-10-CM | POA: Diagnosis not present

## 2017-08-08 DIAGNOSIS — R0602 Shortness of breath: Secondary | ICD-10-CM | POA: Diagnosis not present

## 2017-08-10 DIAGNOSIS — M1712 Unilateral primary osteoarthritis, left knee: Secondary | ICD-10-CM | POA: Diagnosis not present

## 2017-08-10 DIAGNOSIS — M1711 Unilateral primary osteoarthritis, right knee: Secondary | ICD-10-CM | POA: Diagnosis not present

## 2017-08-17 DIAGNOSIS — M1712 Unilateral primary osteoarthritis, left knee: Secondary | ICD-10-CM | POA: Diagnosis not present

## 2017-08-17 DIAGNOSIS — M170 Bilateral primary osteoarthritis of knee: Secondary | ICD-10-CM | POA: Diagnosis not present

## 2017-08-17 DIAGNOSIS — M1711 Unilateral primary osteoarthritis, right knee: Secondary | ICD-10-CM | POA: Diagnosis not present

## 2017-08-23 DIAGNOSIS — L570 Actinic keratosis: Secondary | ICD-10-CM | POA: Diagnosis not present

## 2017-08-23 DIAGNOSIS — L821 Other seborrheic keratosis: Secondary | ICD-10-CM | POA: Diagnosis not present

## 2017-08-23 DIAGNOSIS — L814 Other melanin hyperpigmentation: Secondary | ICD-10-CM | POA: Diagnosis not present

## 2017-08-23 DIAGNOSIS — D225 Melanocytic nevi of trunk: Secondary | ICD-10-CM | POA: Diagnosis not present

## 2017-08-23 DIAGNOSIS — D1801 Hemangioma of skin and subcutaneous tissue: Secondary | ICD-10-CM | POA: Diagnosis not present

## 2017-08-23 DIAGNOSIS — L578 Other skin changes due to chronic exposure to nonionizing radiation: Secondary | ICD-10-CM | POA: Diagnosis not present

## 2017-09-05 ENCOUNTER — Ambulatory Visit: Payer: PPO | Admitting: Pulmonary Disease

## 2017-09-05 ENCOUNTER — Encounter: Payer: Self-pay | Admitting: Pulmonary Disease

## 2017-09-05 ENCOUNTER — Ambulatory Visit (INDEPENDENT_AMBULATORY_CARE_PROVIDER_SITE_OTHER)
Admission: RE | Admit: 2017-09-05 | Discharge: 2017-09-05 | Disposition: A | Discharge status: Home / Self Care | Payer: PPO | Source: Ambulatory Visit | Attending: Pulmonary Disease | Admitting: Pulmonary Disease

## 2017-09-05 VITALS — BP 136/78 | HR 80 | Ht 71.5 in | Wt 278.0 lb

## 2017-09-05 DIAGNOSIS — R0600 Dyspnea, unspecified: Secondary | ICD-10-CM

## 2017-09-05 DIAGNOSIS — R0609 Other forms of dyspnea: Secondary | ICD-10-CM | POA: Diagnosis not present

## 2017-09-05 NOTE — Patient Instructions (Signed)
Shortness of breath: As we described today is many different causes for this.  We will start by evaluating your lungs to make sure there is no problem there. Lung function test Chest x-ray Ambulatory oximetry monitoring If these tests are normal then on the next visit we will consider an echocardiogram and checking a blood test called a proBNP  Overweight: I think you need to exercise more and try to lose weight The following behaviors have been associated with weight loss: Weigh yourself daily Write down everything you eat Drink a glass of water prior to eating a meal Only eat when you are hungry Buy food from the periphery of the grocery store, not the middle

## 2017-09-05 NOTE — Progress Notes (Signed)
Subjective:   PATIENT ID: Joshua Moran GENDER: male DOB: 06/21/1945, MRN: [id number]  Synopsis: Referred in April 2019 for Dyspnea  HPI  Chief Complaint  Patient presents with  . Consult    referred by Dr. Harrington Challenger for worsening SOB Xseveral years.    Joshua Moran complains of shortness of breath: > occurs with "physical anything" and has been present for several years > makes him limit his yard work, climbing stairs/hills > he can carry in groceries > when he is short of breath he says he breathe hard > no chest tightness  > no wheezing > he thinks it is slowly worsening  He goes to the [employer] "sporadically" > he targets 3 days a week but he doesn't always do this > he rides the recumbant bike for 30 minutes and uses other weights   Cough: > he has a "tickle in his throat" > has been present for many years  GERD: > worse when he "doesn't eat right" > has been told he has a hiatal hernia > well controlled with diet > takes nexium  Anemia: > he has struggled with this for years > he has been on iron for years > he doesn't know why he needs the iron  His mother smoked, he died of lung cancer.    He never smoked.  Has never been told he had asthma.  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  He was exposed to cotton dust a lot over 35 years, off and on through the day.    Past Medical History:  Diagnosis Date  . Abnormal screening CT of chest    coronary calcium in left main and RCA  . Glaucoma   . Hyperlipidemia    statin intolerant     Family History  Problem Relation Age of Onset  . Lung cancer Mother        smoker     Social History   Socioeconomic History  . Marital status: Married    Spouse name: Not on file  . Number of children: Not on file  . Years of education: Not on file  . Highest education level: Not on file  Occupational History  . Not on file  Social Needs  . Financial resource strain: Not on file  . Food insecurity:    Worry: Not on  file    Inability: Not on file  . Transportation needs:    Medical: Not on file    Non-medical: Not on file  Tobacco Use  . Smoking status: Never Smoker  . Smokeless tobacco: Never Used  Substance and Sexual Activity  . Alcohol use: Yes    Comment: occasional  . Drug use: No  . Sexual activity: Not on file  Lifestyle  . Physical activity:    Days per week: Not on file    Minutes per session: Not on file  . Stress: Not on file  Relationships  . Social connections:    Talks on phone: Not on file    Gets together: Not on file    Attends religious service: Not on file    Active member of club or organization: Not on file    Attends meetings of clubs or organizations: Not on file    Relationship status: Not on file  . Intimate partner violence:    Fear of current or ex partner: Not on file    Emotionally abused: Not on file    Physically abused:  Not on file    Forced sexual activity: Not on file  Other Topics Concern  . Not on file  Social History Narrative  . Not on file     Allergies  Allergen Reactions  . Penicillins Other (See Comments)    Very flushed  Has patient had a PCN reaction causing immediate rash, facial/tongue/throat swelling, SOB or lightheadedness with hypotension: No Has patient had a PCN reaction causing severe rash involving mucus membranes or skin necrosis: No Has patient had a PCN reaction that required hospitalization No Has patient had a PCN reaction occurring within the last 10 years: No If all of the above answers are "NO", then may proceed with Cephalosporin use.   . Statins Other (See Comments)    Pt c/o muscle aches with use of statins.     Outpatient Medications Prior to Visit  Medication Sig Dispense Refill  . acetaminophen (TYLENOL) 500 MG tablet Take 1,000 mg by mouth every 6 (six) hours as needed for mild pain, moderate pain, fever or headache.    Marland Kitchen aspirin 81 MG tablet Take 81 mg by mouth daily with breakfast.    . brimonidine  (ALPHAGAN) 0.15 % ophthalmic solution Place 1 drop into the right eye 2 (two) times daily.    . cetirizine (ZYRTEC) 10 MG tablet Take 10 mg by mouth every evening.    . ferrous sulfate 325 (65 FE) MG tablet Take 325 mg by mouth daily with breakfast.    . finasteride (PROSCAR) 5 MG tablet Take 5 mg by mouth daily.    . fluticasone (FLONASE) 50 MCG/ACT nasal spray Place 2 sprays into both nostrils at bedtime.    . Glucosamine 750 MG TABS Take 1 tablet by mouth 2 (two) times daily.    Marland Kitchen ketorolac (ACULAR) 0.4 % SOLN Place 1 drop into both eyes at bedtime.    . Multiple Vitamins-Minerals (CENTRUM SILVER ADULT 50+) TABS Take 1 tablet by mouth daily with breakfast.    . sildenafil (VIAGRA) 25 MG tablet Take 25 mg by mouth as needed for erectile dysfunction.    . tamsulosin (FLOMAX) 0.4 MG CAPS capsule Take 0.4 mg by mouth daily after supper.    . calcium carbonate (OS-CAL) 600 MG tablet Take 600 mg by mouth every evening.    . Cyanocobalamin (VITAMIN B-12 IJ) Inject as directed every 14 (fourteen) days.     No facility-administered medications prior to visit.     Review of Systems  Constitutional: Negative for chills, fever, malaise/fatigue and weight loss.  HENT: Negative for congestion, nosebleeds, sinus pain and sore throat.   Eyes: Negative for photophobia, pain and discharge.  Respiratory: Positive for shortness of breath. Negative for cough, hemoptysis, sputum production and wheezing.   Cardiovascular: Positive for leg swelling. Negative for chest pain, palpitations and orthopnea.  Gastrointestinal: Negative for abdominal pain, constipation, diarrhea, nausea and vomiting.  Genitourinary: Negative for dysuria, frequency, hematuria and urgency.  Musculoskeletal: Negative for back pain, joint pain, myalgias and neck pain.  Skin: Negative for itching and rash.  Neurological: Negative for tingling, tremors, sensory change, speech change, focal weakness, seizures, weakness and headaches.    Psychiatric/Behavioral: Negative for memory loss, substance abuse and suicidal ideas. The patient is not nervous/anxious.       Objective:  Physical Exam   Vitals:   09/05/17 1433  BP: 136/78  Pulse: 80  SpO2: 99%  Weight: 278 lb (126.1 kg)  Height: 5' 11.5" (1.816 m)   RA  Gen: morbidly obese,  chronically ill appearing, no acute distress HENT: NCAT, OP clear, neck supple without masses Eyes: PERRL, EOMi Lymph: no cervical lymphadenopathy PULM: CTA B CV: RRR, no mgr, no JVD, trace edema GI: BS+, soft, nontender, no hsm Derm: no rash or skin breakdown MSK: normal bulk and tone Neuro: A&Ox4, CN II-XII intact, strength 5/5 in all 4 extremities Psyche: normal mood and affect   CBC    Component Value Date/Time   WBC 16.3 (H) 03/05/2015 1556   RBC 4.52 03/05/2015 1556   HGB 13.5 03/05/2015 1556   HCT 40.8 03/05/2015 1556   PLT 208 03/05/2015 1556   MCV 90.3 03/05/2015 1556   MCH 29.9 03/05/2015 1556   MCHC 33.1 03/05/2015 1556   RDW 13.9 03/05/2015 1556     Chest imaging: 2016 CT angiogram chest images independently reviewed showing normal pulmonary parenchyma with the exception of some mild atelectasis in the bases bilaterally  PFT:  Labs:  Path:  Echo:  Nuclear stress test: December 2016 low normal LVEF noted, no evidence of ischemia  Heart Catheterization:  Records from his most recent visit with his primary care physician reviewed, labs were performed showing a hemoglobin of 13.2 g/dL, carbon dioxide level of 30 on a basic metabolic panel, and normal renal function.     Assessment & Plan:   Dyspnea, unspecified type - Plan: Pulmonary function test, DG Chest 2 View  Morbid obesity due to excess calories [facility])  Discussion: This is a 72 year old male who comes to my clinic today for evaluation of dyspnea.  He has a lifelong history of non-smoking though he was exposed to significant dust for 35 years and his occupation working for a [employer].  Objectively his lungs are clear, his oxygenation is normal, and he is morbidly obese.  Chest imaging from 3 years ago showed no significant evidence of an underlying pulmonary parenchymal abnormality with the exception of some mild atelectasis which I think is due to his body shape and size.  I explained to him today that the differential diagnosis of dyspnea is broad including lung disease, heart disease, anemia, neuromuscular weakness among a few.  Deconditioning and being overweight are also likely contributors.  In his particular case with his exposure history I think it is important to get lung function testing to make sure there is no evidence of an underlying lung disease.  If those tests are within normal limits and I think he probably needs to have an echocardiogram because his nuclear stress test in 2016 showed low normal LVEF.  This all being said, the most likely contributor to his shortness of breath is being overweight and being deconditioned and so he really needs to work on weight loss and exercising more regularly.  Plan: Shortness of breath: As we described today is many different causes for this.  We will start by evaluating your lungs to make sure there is no problem there. Lung function test Chest x-ray Ambulatory oximetry monitoring If these tests are normal then on the next visit we will consider an echocardiogram and checking a blood test called a proBNP  Overweight: I think you need to exercise more and try to lose weight The following behaviors have been associated with weight loss: Weigh yourself daily Write down everything you eat Drink a glass of water prior to eating a meal Only eat when you are hungry Buy food from the periphery of the grocery store, not the middle  We will see you back in 2 weeks  with an NP to go over the results.    Current Outpatient Medications:  .  acetaminophen (TYLENOL) 500 MG tablet, Take 1,000 mg by mouth every 6 (six)  hours as needed for mild pain, moderate pain, fever or headache., Disp: , Rfl:  .  aspirin 81 MG tablet, Take 81 mg by mouth daily with breakfast., Disp: , Rfl:  .  brimonidine (ALPHAGAN) 0.15 % ophthalmic solution, Place 1 drop into the right eye 2 (two) times daily., Disp: , Rfl:  .  cetirizine (ZYRTEC) 10 MG tablet, Take 10 mg by mouth every evening., Disp: , Rfl:  .  ferrous sulfate 325 (65 FE) MG tablet, Take 325 mg by mouth daily with breakfast., Disp: , Rfl:  .  finasteride (PROSCAR) 5 MG tablet, Take 5 mg by mouth daily., Disp: , Rfl:  .  fluticasone (FLONASE) 50 MCG/ACT nasal spray, Place 2 sprays into both nostrils at bedtime., Disp: , Rfl:  .  Glucosamine 750 MG TABS, Take 1 tablet by mouth 2 (two) times daily., Disp: , Rfl:  .  ketorolac (ACULAR) 0.4 % SOLN, Place 1 drop into both eyes at bedtime., Disp: , Rfl:  .  Multiple Vitamins-Minerals (CENTRUM SILVER ADULT 50+) TABS, Take 1 tablet by mouth daily with breakfast., Disp: , Rfl:  .  sildenafil (VIAGRA) 25 MG tablet, Take 25 mg by mouth as needed for erectile dysfunction., Disp: , Rfl:  .  tamsulosin (FLOMAX) 0.4 MG CAPS capsule, Take 0.4 mg by mouth daily after supper., Disp: , Rfl:

## 2017-09-07 ENCOUNTER — Telehealth: Payer: Self-pay | Admitting: Pulmonary Disease

## 2017-09-07 NOTE — Telephone Encounter (Signed)
Notes recorded by Juanito Doom, MD on 09/07/2017 at 8:06 AM EDT A, Please let the patient know this was OK Thanks, B --------- Spoke with pt, aware of results/recs.  Nothing further needed.

## 2017-09-13 DIAGNOSIS — H401113 Primary open-angle glaucoma, right eye, severe stage: Secondary | ICD-10-CM | POA: Diagnosis not present

## 2017-09-13 DIAGNOSIS — Z961 Presence of intraocular lens: Secondary | ICD-10-CM | POA: Diagnosis not present

## 2017-09-19 ENCOUNTER — Ambulatory Visit (INDEPENDENT_AMBULATORY_CARE_PROVIDER_SITE_OTHER): Payer: PPO | Admitting: Adult Health

## 2017-09-19 ENCOUNTER — Encounter: Payer: Self-pay | Admitting: Adult Health

## 2017-09-19 ENCOUNTER — Other Ambulatory Visit (INDEPENDENT_AMBULATORY_CARE_PROVIDER_SITE_OTHER): Payer: PPO

## 2017-09-19 ENCOUNTER — Ambulatory Visit (INDEPENDENT_AMBULATORY_CARE_PROVIDER_SITE_OTHER): Payer: PPO | Admitting: Pulmonary Disease

## 2017-09-19 VITALS — BP 118/74 | HR 72 | Ht 72.0 in | Wt 277.0 lb

## 2017-09-19 DIAGNOSIS — R0609 Other forms of dyspnea: Secondary | ICD-10-CM | POA: Insufficient documentation

## 2017-09-19 DIAGNOSIS — R0600 Dyspnea, unspecified: Secondary | ICD-10-CM

## 2017-09-19 LAB — BASIC METABOLIC PANEL
BUN: 22 mg/dL (ref 6–23)
CHLORIDE: 104 meq/L (ref 96–112)
CO2: 27 meq/L (ref 19–32)
CREATININE: 0.94 mg/dL (ref 0.40–1.50)
Calcium: 9 mg/dL (ref 8.4–10.5)
GFR: 83.62 mL/min (ref >60.00)
Glucose, Bld: 90 mg/dL (ref 70–99)
Potassium: 4.1 mEq/L (ref 3.5–5.1)
Sodium: 136 mEq/L (ref 135–145)

## 2017-09-19 LAB — PULMONARY FUNCTION TEST
DL/VA % pred: 78 %
DL/VA: 3.68 ml/min/mmHg/L
DLCO unc % pred: 78 %
DLCO unc: 27.51 ml/min/mmHg
FEF 25-75 PRE: 3.2 L/s
FEF 25-75 Post: 2.91 L/sec
FEF2575-%Change-Post: -9 %
FEF2575-%PRED-PRE: 127 %
FEF2575-%Pred-Post: 115 %
FEV1-%Change-Post: -4 %
FEV1-%Pred-Post: 108 %
FEV1-%Pred-Pre: 113 %
FEV1-PRE: 3.84 L
FEV1-Post: 3.68 L
FEV1FVC-%Change-Post: -1 %
FEV1FVC-%Pred-Pre: 101 %
FEV6-%CHANGE-POST: -1 %
FEV6-%PRED-PRE: 115 %
FEV6-%Pred-Post: 114 %
FEV6-POST: 5.01 L
FEV6-Pre: 5.08 L
FEV6FVC-%Change-Post: 1 %
FEV6FVC-%PRED-POST: 105 %
FEV6FVC-%Pred-Pre: 104 %
FVC-%CHANGE-POST: -2 %
FVC-%Pred-Post: 108 %
FVC-%Pred-Pre: 111 %
FVC-Post: 5.05 L
FVC-Pre: 5.19 L
POST FEV6/FVC RATIO: 99 %
PRE FEV1/FVC RATIO: 74 %
Post FEV1/FVC ratio: 73 %
Pre FEV6/FVC Ratio: 98 %

## 2017-09-19 LAB — CBC WITH DIFFERENTIAL/PLATELET
BASOS PCT: 0.7 % (ref 0.0–3.0)
Basophils Absolute: 0 10*3/uL (ref 0.0–0.1)
EOS ABS: 0.3 10*3/uL (ref 0.0–0.7)
Eosinophils Relative: 4.8 % (ref 0.0–5.0)
HEMATOCRIT: 42.5 % (ref 39.0–52.0)
Hemoglobin: 14 g/dL (ref 13.0–17.0)
LYMPHS PCT: 28.9 % (ref 12.0–46.0)
Lymphs Abs: 1.8 10*3/uL (ref 0.7–4.0)
MCHC: 33 g/dL (ref 30.0–36.0)
MCV: 92.3 fl (ref 78.0–100.0)
MONO ABS: 0.6 10*3/uL (ref 0.1–1.0)
Monocytes Relative: 10.4 % (ref 3.0–12.0)
NEUTROS ABS: 3.4 10*3/uL (ref 1.4–7.7)
Neutrophils Relative %: 55.2 % (ref 43.0–77.0)
PLATELETS: 232 10*3/uL (ref 150.0–400.0)
RBC: 4.6 Mil/uL (ref 4.22–5.81)
RDW: 13.5 % (ref 11.5–15.5)
WBC: 6.2 10*3/uL (ref 4.0–10.5)

## 2017-09-19 LAB — BRAIN NATRIURETIC PEPTIDE: Pro B Natriuretic peptide (BNP): 19 pg/mL (ref 0.0–100.0)

## 2017-09-19 NOTE — Progress Notes (Signed)
@Patient  ID: Joshua Moran, male    DOB: 1945/06/18, 72 y.o.   MRN: [id number]  Chief Complaint  Patient presents with  . Follow-up    Dyspnea    Referring provider: Lawerance Cruel, MD  HPI: 72 year old male never smoker seen for pulmonary consult September 05, 2017 for shortness of breath over the last several years  09/19/2017 Follow up ; Dyspnea  Patient returns for a 2-week follow-up.  Patient was seen last visit for an evaluation for dyspnea for several years.  Last visit patient was recommended to return for pulmonary function testing. Was set up for a PFT that showed normal lung function with an FEV1 at 108%, ratio 73, FVC 108%, no significant bronchodilator response.  DLCO 78%. Chest x-ray on April 3 showed lungs clear. Has DJD , limited heavy activity . Sedentary for last 15 yrs. Trying to lose weight . Due to chronic knee pain , unable to exercise much .  Previous cardiac stress test December 2016 showed EF 45-54%, low risk study. PMH neg for HTN . + dyslipidemia . FH neg for CAD ,  Has mild lower extremity edema that is worse in the evenings.  He denies any orthopnea or dyspnea at rest.  Mainly gets short of breath with any type of activity prolonged walking or heavy activity.  Patient says he has had 2 sleep studies in the past and was told he did not have sleep apnea.  Allergies  Allergen Reactions  . Penicillins Other (See Comments)    Very flushed  Has patient had a PCN reaction causing immediate rash, facial/tongue/throat swelling, SOB or lightheadedness with hypotension: No Has patient had a PCN reaction causing severe rash involving mucus membranes or skin necrosis: No Has patient had a PCN reaction that required hospitalization No Has patient had a PCN reaction occurring within the last 10 years: No If all of the above answers are "NO", then may proceed with Cephalosporin use.   . Statins Other (See Comments)    Pt c/o muscle aches with use of statins.     Immunization History  Administered Date(s) Administered  . Influenza,inj,quad, With Preservative 02/06/2017  . Pneumococcal Polysaccharide-23 09/20/2014    Past Medical History:  Diagnosis Date  . Abnormal screening CT of chest    coronary calcium in left main and RCA  . Glaucoma   . Hyperlipidemia    statin intolerant    Tobacco History: Social History   Tobacco Use  Smoking Status Never Smoker  Smokeless Tobacco Never Used   Counseling given: Not Answered   Outpatient Encounter Medications as of 09/19/2017  Medication Sig  . acetaminophen (TYLENOL) 500 MG tablet Take 1,000 mg by mouth every 6 (six) hours as needed for mild pain, moderate pain, fever or headache.  Marland Kitchen aspirin 81 MG tablet Take 81 mg by mouth daily with breakfast.  . brimonidine (ALPHAGAN) 0.15 % ophthalmic solution Place 1 drop into the right eye 2 (two) times daily.  . cetirizine (ZYRTEC) 10 MG tablet Take 10 mg by mouth every evening.  . ferrous sulfate 325 (65 FE) MG tablet Take 325 mg by mouth daily with breakfast.  . finasteride (PROSCAR) 5 MG tablet Take 5 mg by mouth daily.  . fluticasone (FLONASE) 50 MCG/ACT nasal spray Place 2 sprays into both nostrils at bedtime.  . Glucosamine 750 MG TABS Take 1 tablet by mouth 2 (two) times daily.  Marland Kitchen ketorolac (ACULAR) 0.4 % SOLN Place 1 drop into both eyes at  bedtime.  . Multiple Vitamins-Minerals (CENTRUM SILVER ADULT 50+) TABS Take 1 tablet by mouth daily with breakfast.  . sildenafil (VIAGRA) 25 MG tablet Take 25 mg by mouth as needed for erectile dysfunction.  . tamsulosin (FLOMAX) 0.4 MG CAPS capsule Take 0.4 mg by mouth daily after supper.   No facility-administered encounter medications on file as of 09/19/2017.      Review of Systems  Constitutional:   No  weight loss, night sweats,  Fevers, chills, + fatigue, or  lassitude.  HEENT:   No headaches,  Difficulty swallowing,  Tooth/dental problems, or  Sore throat,                No sneezing,  itching, ear ache, nasal congestion, post nasal drip,   CV:  No chest pain,  Orthopnea, PND, swelling in lower extremities, anasarca, dizziness, palpitations, syncope.   GI  No heartburn, indigestion, abdominal pain, nausea, vomiting, diarrhea, change in bowel habits, loss of appetite, bloody stools.   Resp: No shortness of breath with exertion or at rest.  No excess mucus, no productive cough,  No non-productive cough,  No coughing up of blood.  No change in color of mucus.  No wheezing.  No chest wall deformity  Skin: no rash or lesions.  GU: no dysuria, change in color of urine, no urgency or frequency.  No flank pain, no hematuria   MS:  +joint pain     Physical Exam  BP 118/74 (BP Location: Right Arm, Cuff Size: Normal)   Pulse 72   Ht 6' (1.829 m)   Wt 277 lb (125.6 kg)   SpO2 97%   BMI 37.57 kg/m   GEN: A/Ox3; pleasant , NAD, morbidly obese   HEENT:  [place]/AT,  EACs-clear, TMs-wnl, NOSE-clear, THROAT-clear, no lesions, no postnasal drip or exudate noted.   NECK:  Supple w/ fair ROM; no JVD; normal carotid impulses w/o bruits; no thyromegaly or nodules palpated; no lymphadenopathy.    RESP  Clear  P & A; w/o, wheezes/ rales/ or rhonchi. no accessory muscle use, no dullness to percussion  CARD:  RRR, no m/r/g,tr peripheral edema, pulses intact, no cyanosis or clubbing.  GI:   Soft & nt; nml bowel sounds; no organomegaly or masses detected.   Musco: Warm bil, no deformities or joint swelling noted.   Neuro: alert, no focal deficits noted.    Skin: Warm, no lesions or rashes    Lab Results:  CBC   BNP No results found for: BNP  ProBNP No results found for: PROBNP  Imaging: Dg Chest 2 View  Result Date: 09/06/2017 CLINICAL DATA:  Dyspnea on exertion EXAM: CHEST - 2 VIEW COMPARISON:  02/25/2015 FINDINGS: The heart size and mediastinal contours are within normal limits. Both lungs are clear. The visualized skeletal structures are unremarkable. IMPRESSION: No  active cardiopulmonary disease. Electronically Signed   By: Inez Catalina M.D.   On: 09/06/2017 08:42     Assessment & Plan:   Dyspnea Dyspnea for several years.  Suspect is multifactorial with underlying morbid obesity, deconditioning.  PFT showed no evidence of airflow obstruction or restriction.  Chest x-ray shows clear lungs. We will check labs.  Set up for a 2D echo  Previous cardiac stress test was  considered a low risk study for ischemia.  Did show a slightly decreased EF.  Plan  Patient Instructions  Set up for a 2 D echo .  Activity as tolerated.  Labs today .  Follow up with Dr.  McQuaid in 6-8 weeks and As needed   Please contact office for sooner follow up if symptoms do not improve or worsen or seek emergency care          Rexene Edison, NP 09/19/2017

## 2017-09-19 NOTE — Patient Instructions (Addendum)
Set up for a 2 D echo .  Activity as tolerated.  Labs today .  Follow up with Dr. Lake Bells in 6-8 weeks and As needed   Please contact office for sooner follow up if symptoms do not improve or worsen or seek emergency care

## 2017-09-19 NOTE — Assessment & Plan Note (Signed)
Dyspnea for several years.  Suspect is multifactorial with underlying morbid obesity, deconditioning.  PFT showed no evidence of airflow obstruction or restriction.  Chest x-ray shows clear lungs. We will check labs.  Set up for a 2D echo  Previous cardiac stress test was  considered a low risk study for ischemia.  Did show a slightly decreased EF.  Plan  Patient Instructions  Set up for a 2 D echo .  Activity as tolerated.  Labs today .  Follow up with Dr. Lake Bells in 6-8 weeks and As needed   Please contact office for sooner follow up if symptoms do not improve or worsen or seek emergency care

## 2017-09-19 NOTE — Progress Notes (Signed)
PFT completed today.  

## 2017-09-20 NOTE — Progress Notes (Signed)
Reviewed, agree 

## 2017-09-25 ENCOUNTER — Telehealth: Payer: Self-pay | Admitting: Adult Health

## 2017-09-25 NOTE — Telephone Encounter (Signed)
Notes recorded by Melvenia Needles, NP on 09/21/2017 at 11:32 AM EDT Labs are normal  BNP/CHF marker is nml  Cbc nml with no anemia  Cont w/ ov recs Please contact office for sooner follow up if symptoms do not improve or worsen or seek emergency care   Spoke with patient. He is aware of results. Nothing else needed at time of call.

## 2017-09-27 ENCOUNTER — Other Ambulatory Visit: Payer: Self-pay

## 2017-09-27 ENCOUNTER — Ambulatory Visit (HOSPITAL_COMMUNITY): Payer: PPO | Attending: Cardiovascular Disease

## 2017-09-27 DIAGNOSIS — R0600 Dyspnea, unspecified: Secondary | ICD-10-CM | POA: Diagnosis not present

## 2017-09-27 DIAGNOSIS — I340 Nonrheumatic mitral (valve) insufficiency: Secondary | ICD-10-CM | POA: Insufficient documentation

## 2017-09-27 DIAGNOSIS — E785 Hyperlipidemia, unspecified: Secondary | ICD-10-CM | POA: Diagnosis not present

## 2017-10-03 ENCOUNTER — Telehealth: Payer: Self-pay | Admitting: Adult Health

## 2017-10-03 ENCOUNTER — Telehealth: Payer: Self-pay | Admitting: Cardiovascular Disease

## 2017-10-03 NOTE — Telephone Encounter (Signed)
TP please advise on echo results.

## 2017-10-03 NOTE — Telephone Encounter (Signed)
SPOKE TO PATIENT . INFORMED PATIENT WILL NEED TO CALL T. PARRET NP ,WHO ORDER THE TES. HE VERBALIZED UNDERSTANDING.

## 2017-10-03 NOTE — Telephone Encounter (Signed)
New Message:      Pt is calling about his echo results

## 2017-10-04 NOTE — Telephone Encounter (Signed)
These have been ready for 3 days , please see echo notes that were resulted on 4/29.

## 2017-10-04 NOTE — Telephone Encounter (Signed)
Advised pt of results. Pt understood and nothing further is needed.   

## 2017-10-09 ENCOUNTER — Telehealth: Payer: Self-pay | Admitting: Cardiovascular Disease

## 2017-10-09 NOTE — Telephone Encounter (Signed)
New Message:       Pt is calling to see if Gwenlyn Found will look at an EKG he had done about a week and a half ago.

## 2017-10-09 NOTE — Telephone Encounter (Signed)
Spoke with Pt who states he had an echo done on 4/24 and would like for Dr. Gwenlyn Found to review it and give any further recommendations. Routed to MD

## 2017-10-12 NOTE — Telephone Encounter (Signed)
The patient's 2D echo was essentially normal.

## 2017-10-17 NOTE — Telephone Encounter (Signed)
Left message for patient of echo results.

## 2017-11-03 DIAGNOSIS — M1711 Unilateral primary osteoarthritis, right knee: Secondary | ICD-10-CM | POA: Diagnosis not present

## 2017-11-03 DIAGNOSIS — M1712 Unilateral primary osteoarthritis, left knee: Secondary | ICD-10-CM | POA: Diagnosis not present

## 2017-11-09 ENCOUNTER — Telehealth: Payer: Self-pay | Admitting: *Deleted

## 2017-11-09 NOTE — Telephone Encounter (Signed)
   Primary Cardiologist:Joshua Gwenlyn Found, MD  Chart reviewed as part of pre-operative protocol coverage. Because of Joshua Moran's past medical history and time since last visit, he/she will require a follow-up visit in order to better assess preoperative cardiovascular risk. Last OV was 2016, therefore unable to clear without appt.  Pre-op covering staff: - Please schedule appointment and call patient to inform them. - Please contact requesting surgeon's office via preferred method (i.e, phone, fax) to inform them of need for appointment prior to surgery.  [name], PA-C  11/09/2017, 3:30 PM

## 2017-11-09 NOTE — Telephone Encounter (Signed)
Called pt re: surgical clearance for sx 01/01/18. He needs an appt to be seen before he can be cleared. Left a detailed message that I have scheduled him to see Dr. Gwenlyn Found 12/12/17 @ 9:15 for this to be taken care of, and if this wasn't a good day / time for him, to call the office back. Will leave in preop callback to give pt time to call back.

## 2017-11-09 NOTE — Telephone Encounter (Signed)
    [facility] Pre-operative Risk Assessment    Request for surgical clearance:  1. What type of surgery is being performed? L TKA  3 2. When is this surgery scheduled? 01/01/18   3. What type of clearance is required (medical clearance vs. Pharmacy clearance to hold med vs. Both)? both  4. Are there any medications that need to be held prior to surgery and how long? ASA   5. Practice name and name of physician performing surgery? [facility] Dr. Wynelle Link   6. What is your office phone number [phone]     7.   What is your office fax number [phone] attn: Derl Barrow  8.   Anesthesia type (None, local, MAC, general) ?   Joshua Moran 11/09/2017, 9:25 AM  _________________________________________________________________   (provider comments below)

## 2017-11-10 NOTE — Telephone Encounter (Signed)
Left message for pt to call to confirm appointment.

## 2017-11-10 NOTE — Telephone Encounter (Signed)
Spoke with pt, he confirmed his appointment for clearance.

## 2017-11-13 ENCOUNTER — Ambulatory Visit: Payer: PPO | Admitting: Pulmonary Disease

## 2017-11-14 ENCOUNTER — Telehealth: Payer: Self-pay | Admitting: Hematology

## 2017-11-14 ENCOUNTER — Encounter: Payer: Self-pay | Admitting: Hematology

## 2017-11-14 NOTE — Telephone Encounter (Signed)
New referral received from [facility], Dr. Wynelle Link, for the pt to see Dr. Irene Limbo on 6/27 at 1pm. Pt aware to arrive 30 minutes early. Letter mailed.

## 2017-11-21 DIAGNOSIS — R350 Frequency of micturition: Secondary | ICD-10-CM | POA: Diagnosis not present

## 2017-11-21 DIAGNOSIS — N419 Inflammatory disease of prostate, unspecified: Secondary | ICD-10-CM | POA: Diagnosis not present

## 2017-11-29 NOTE — Progress Notes (Signed)
HEMATOLOGY/ONCOLOGY CONSULTATION NOTE  Date of Service: 11/30/2017  Patient Care Team: Joshua Cruel, MD as PCP - General (Family Medicine) Joshua Harp, MD as PCP - Cardiology (Cardiology)  CHIEF COMPLAINTS/PURPOSE OF CONSULTATION:  Concerns for bleeding/hemostatic disorder  HISTORY OF PRESENTING ILLNESS:   Joshua Moran is a wonderful 72 y.o. male who has been referred to [place] by Dr Lona Kettle for evaluation and management of Concerns for clotting disorder. The pt reports that he is doing well overall.   The pt is awaiting a left knee total arthroscopy on 12/31/17 with Dr Hector Shade and presents today with a concern for previous excessive bleedings.   The pt reports a previous vasectomy 30-40 years ago, after which he had bleeding at the incision site for several hours. He denies being on any pain medications or blood thinners at the time. He notes that he did not have a pressured bandage wrap.  He notes that 30 years ago he had wisdom tooth extraction that bled for several hours after surgery.   He notes that he had a right eye detached retina that hemorrhaged during surgery. He notes that he may have been taking 81mg  aspirn at the time. He notes that the detached retina was observed at the time of cataracts. He denies taking Finasteride at that time.   He denies any abnormal bleeding outside of this including blood in the stools, gum bleeding, nose bleeding., joint bleeds or muscle hematomas.  He has taken Meloxicam for his knee arthritis. He had a colonoscopy in the past and has had polyps removed without excessive bleeding.   Most recent lab results (09/19/17) of CBC w/diff is as follows: all values are WNL.  On review of systems, pt reports knee pain, bilateral leg swelling, varicose veins, and denies abnormal bruising, spontaneous bleeding, nose bleeds, gum bleeds, abdominal pains, testicular pain or swelling, and any other symptoms.   On PMHx the pt reports knee  arthritis, HTN, hyperlipidemia, and denies any known blood disorders, blood clots. On Family Hx the pt reports a cousin with "some kind of blood disorder," grandfather with blood clot and stroke.  The pt reports that he has a Penicillin allergy which was indicated by turning flush in the cheeks.   MEDICAL HISTORY:  Past Medical History:  Diagnosis Date  . Abnormal screening CT of chest    coronary calcium in left main and RCA  . Glaucoma   . Hyperlipidemia    statin intolerant    SURGICAL HISTORY: Past Surgical History:  Procedure Laterality Date  . KNEE ARTHROSCOPY W/ DEBRIDEMENT    . RETINAL DETACHMENT SURGERY Bilateral   . VASECTOMY      SOCIAL HISTORY: Social History   Socioeconomic History  . Marital status: Married    Spouse name: Not on file  . Number of children: Not on file  . Years of education: Not on file  . Highest education level: Not on file  Occupational History  . Not on file  Social Needs  . Financial resource strain: Not on file  . Food insecurity:    Worry: Not on file    Inability: Not on file  . Transportation needs:    Medical: Not on file    Non-medical: Not on file  Tobacco Use  . Smoking status: Never Smoker  . Smokeless tobacco: Never Used  Substance and Sexual Activity  . Alcohol use: Yes    Comment: occasional  . Drug use: No  . Sexual  activity: Not on file  Lifestyle  . Physical activity:    Days per week: Not on file    Minutes per session: Not on file  . Stress: Not on file  Relationships  . Social connections:    Talks on phone: Not on file    Gets together: Not on file    Attends religious service: Not on file    Active member of club or organization: Not on file    Attends meetings of clubs or organizations: Not on file    Relationship status: Not on file  . Intimate partner violence:    Fear of current or ex partner: Not on file    Emotionally abused: Not on file    Physically abused: Not on file    Forced sexual  activity: Not on file  Other Topics Concern  . Not on file  Social History Narrative  . Not on file    FAMILY HISTORY: Family History  Problem Relation Age of Onset  . Lung cancer Mother        smoker    ALLERGIES:  is allergic to penicillins and statins.  MEDICATIONS:  Current Outpatient Medications  Medication Sig Dispense Refill  . acetaminophen (TYLENOL) 500 MG tablet Take 1,000 mg by mouth every 6 (six) hours as needed for mild pain, moderate pain, fever or headache.    Marland Kitchen aspirin 81 MG tablet Take 81 mg by mouth daily with breakfast.    . brimonidine (ALPHAGAN) 0.15 % ophthalmic solution Place 1 drop into the right eye 2 (two) times daily.    . cetirizine (ZYRTEC) 10 MG tablet Take 10 mg by mouth every evening.    . ferrous sulfate 325 (65 FE) MG tablet Take 325 mg by mouth daily with breakfast.    . finasteride (PROSCAR) 5 MG tablet Take 5 mg by mouth daily.    . fluticasone (FLONASE) 50 MCG/ACT nasal spray Place 2 sprays into both nostrils at bedtime.    . Glucosamine 750 MG TABS Take 1 tablet by mouth 2 (two) times daily.    Marland Kitchen ketorolac (ACULAR) 0.4 % SOLN Place 1 drop into both eyes at bedtime.    . Multiple Vitamins-Minerals (CENTRUM SILVER ADULT 50+) TABS Take 1 tablet by mouth daily with breakfast.    . sildenafil (VIAGRA) 25 MG tablet Take 25 mg by mouth as needed for erectile dysfunction.    . tamsulosin (FLOMAX) 0.4 MG CAPS capsule Take 0.4 mg by mouth daily after supper.     No current facility-administered medications for this visit.     REVIEW OF SYSTEMS:    10 Point review of Systems was done is negative except as noted above.  PHYSICAL EXAMINATION:  . Vitals:   11/30/17 1315  BP: 105/68  Pulse: 78  Resp: 18  Temp: 98.4 F (36.9 C)  SpO2: 97%   Filed Weights   11/30/17 1315  Weight: 274 lb 11.2 oz (124.6 kg)   .Body mass index is 37.26 kg/m.  GENERAL:alert, in no acute distress and comfortable SKIN: no acute rashes, no significant  lesions EYES: conjunctiva are pink and non-injected, sclera anicteric OROPHARYNX: MMM, no exudates, no oropharyngeal erythema or ulceration NECK: supple, no JVD LYMPH:  no palpable lymphadenopathy in the cervical, axillary or inguinal regions LUNGS: clear to auscultation b/l with normal respiratory effort HEART: regular rate & rhythm ABDOMEN:  normoactive bowel sounds , non tender, not distended. Extremity: no pedal edema PSYCH: alert & oriented x 3 with fluent speech  NEURO: no focal motor/sensory deficits  LABORATORY DATA:  I have reviewed the data as listed  . CBC Latest Ref Rng & Units 11/30/2017 09/19/2017 03/05/2015  WBC 4.0 - 10.3 K/uL 5.8 6.2 16.3(H)  Hemoglobin 13.0 - 17.1 g/dL 13.1 14.0 13.5  Hematocrit 38.4 - 49.9 % 40.2 42.5 40.8  Platelets 140 - 400 K/uL 308 232.0 208   . CBC    Component Value Date/Time   WBC 5.8 11/30/2017 1352   RBC 4.37 11/30/2017 1352   HGB 13.1 11/30/2017 1352   HCT 40.2 11/30/2017 1352   PLT 308 11/30/2017 1352   MCV 91.9 11/30/2017 1352   MCH 29.9 11/30/2017 1352   MCHC 32.6 11/30/2017 1352   RDW 13.9 11/30/2017 1352   LYMPHSABS 1.1 11/30/2017 1352   MONOABS 0.5 11/30/2017 1352   EOSABS 0.2 11/30/2017 1352   BASOSABS 0.1 11/30/2017 1352    . CMP Latest Ref Rng & Units 11/30/2017 09/19/2017 03/05/2015  Glucose 70 - 99 mg/dL 86 90 108(H)  BUN 8 - 23 mg/dL 22 22 21(H)  Creatinine 0.61 - 1.24 mg/dL 0.96 0.94 0.96  Sodium 135 - 145 mmol/L 141 136 135  Potassium 3.5 - 5.1 mmol/L 4.3 4.1 4.1  Chloride 98 - 111 mmol/L 108 104 104  CO2 22 - 32 mmol/L 28 27 24   Calcium 8.9 - 10.3 mg/dL 8.9 9.0 8.9  Total Protein 6.5 - 8.1 g/dL 7.0 - 6.9  Total Bilirubin 0.3 - 1.2 mg/dL 0.2(L) - 0.8  Alkaline Phos 38 - 126 U/L 82 - 63  AST 15 - 41 U/L 22 - 22  ALT 0 - 44 U/L 33 - 16(L)    Component     Latest Ref Rng & Units 11/30/2017  Coagulation Factor VIII     56 - 140 % 155 (H)  Ristocetin Co-factor, Plasma     50 - 200 % 204 (H)  Von Willebrand  Antigen, Plasma     50 - 200 % 274 (H)  Prothrombin Time     11.4 - 15.2 seconds 13.2  INR      1.01  PFA Interpretation        Collagen / Epinephrine     0 - 193 seconds 164  Von Willebrand Multimers      Comment  APTT     24 - 36 seconds 31  Fibrinogen     210 - 475 mg/dL 483 (H)    RADIOGRAPHIC STUDIES:  I have personally reviewed the radiological images as listed and agreed with the findings in the report. No results found.  ASSESSMENT & PLAN:   72 y.o. male with  1. Concerns for hemostatic/bleeding disorder with previous surgical procedures Patient bleeding history not strongly off a hematostatic disorder and lacks a lot of detail. PLAN -Discussed patient's most recent labs from 09/19/17, CBC w/diff showed all values WNL including HGB and PLT.  -Pt takes Meloxicam and aspirin - known to affect PLT function -Will collect labs and screening tests today, I do expect PLT function might be abnormal given that the pt currently takes 81mg  Aspirin -labs were done and reviewed- normal platelet function/number, PT, aPTT and VWP. - no overt evidence of a bleeding diathesis noted. -elevated FVIII, VWF and fibrinogen suggest increases a acute phase reactants from ?inflammation. -Will let orthopedic Dr Hector Shade determine if holding aspirin would be beneficial given the planned extent of surgery -Varicose veins as a risk factor for clotting after surgery -will need appropriate post-operative VTE prophylaxis  Labs today RTC with Dr Irene Limbo as needed    All of the patients questions were answered with apparent satisfaction. The patient knows to call the clinic with any problems, questions or concerns.  The toal time spent in the appt was 45 minutes and more than 50% was on counseling and direct patient cares.    Sullivan Lone MD MS AAHIVMS [facility] Hematology/Oncology Physician [facility]  (Office):       [phone] (Work cell):  [phone] (Fax):            [phone]  11/30/2017 1:43 PM  I, Baldwin [place], am acting as a Education administrator for Dr Irene Limbo.   .I have reviewed the above documentation for accuracy and completeness, and I agree with the above. Brunetta Genera MD

## 2017-11-30 ENCOUNTER — Telehealth: Payer: Self-pay | Admitting: Hematology

## 2017-11-30 ENCOUNTER — Encounter: Payer: Self-pay | Admitting: Hematology

## 2017-11-30 ENCOUNTER — Inpatient Hospital Stay: Payer: PPO

## 2017-11-30 ENCOUNTER — Inpatient Hospital Stay: Payer: PPO | Attending: Hematology | Admitting: Hematology

## 2017-11-30 VITALS — BP 105/68 | HR 78 | Temp 98.4°F | Resp 18 | Ht 72.0 in | Wt 274.7 lb

## 2017-11-30 DIAGNOSIS — Z7982 Long term (current) use of aspirin: Secondary | ICD-10-CM | POA: Diagnosis not present

## 2017-11-30 DIAGNOSIS — Z01818 Encounter for other preprocedural examination: Secondary | ICD-10-CM

## 2017-11-30 DIAGNOSIS — D699 Hemorrhagic condition, unspecified: Secondary | ICD-10-CM

## 2017-11-30 DIAGNOSIS — I10 Essential (primary) hypertension: Secondary | ICD-10-CM | POA: Diagnosis not present

## 2017-11-30 LAB — CMP (CANCER CENTER ONLY)
ALBUMIN: 3.4 g/dL — AB (ref 3.5–5.0)
ALK PHOS: 82 U/L (ref 38–126)
ALT: 33 U/L (ref 0–44)
AST: 22 U/L (ref 15–41)
Anion gap: 5 (ref 5–15)
BUN: 22 mg/dL (ref 8–23)
CALCIUM: 8.9 mg/dL (ref 8.9–10.3)
CHLORIDE: 108 mmol/L (ref 98–111)
CO2: 28 mmol/L (ref 22–32)
CREATININE: 0.96 mg/dL (ref 0.61–1.24)
GFR, Estimated: >60 mL/min (ref >60)
GLUCOSE: 86 mg/dL (ref 70–99)
Potassium: 4.3 mmol/L (ref 3.5–5.1)
SODIUM: 141 mmol/L (ref 135–145)
Total Bilirubin: 0.2 mg/dL — ABNORMAL LOW (ref 0.3–1.2)
Total Protein: 7 g/dL (ref 6.5–8.1)

## 2017-11-30 LAB — CBC WITH DIFFERENTIAL/PLATELET
BASOS ABS: 0.1 10*3/uL (ref 0.0–0.1)
Basophils Relative: 1 %
Eosinophils Absolute: 0.2 10*3/uL (ref 0.0–0.5)
Eosinophils Relative: 4 %
HEMATOCRIT: 40.2 % (ref 38.4–49.9)
Hemoglobin: 13.1 g/dL (ref 13.0–17.1)
LYMPHS ABS: 1.1 10*3/uL (ref 0.9–3.3)
LYMPHS PCT: 18 %
MCH: 29.9 pg (ref 27.2–33.4)
MCHC: 32.6 g/dL (ref 32.0–36.0)
MCV: 91.9 fL (ref 79.3–98.0)
MONO ABS: 0.5 10*3/uL (ref 0.1–0.9)
Monocytes Relative: 9 %
NEUTROS ABS: 3.9 10*3/uL (ref 1.5–6.5)
Neutrophils Relative %: 68 %
Platelets: 308 10*3/uL (ref 140–400)
RBC: 4.37 MIL/uL (ref 4.20–5.82)
RDW: 13.9 % (ref 11.0–14.6)
WBC: 5.8 10*3/uL (ref 4.0–10.3)

## 2017-11-30 LAB — APTT: aPTT: 31 seconds (ref 24–36)

## 2017-11-30 LAB — PLATELET FUNCTION ASSAY: COLLAGEN / EPINEPHRINE: 164 s (ref 0–193)

## 2017-11-30 LAB — FIBRINOGEN: FIBRINOGEN: 483 mg/dL — AB (ref 210–475)

## 2017-11-30 LAB — PROTIME-INR
INR: 1.01
PROTHROMBIN TIME: 13.2 s (ref 11.4–15.2)

## 2017-11-30 NOTE — Telephone Encounter (Signed)
Lab Appointment scheduled / RTC with Dr Irene Limbo as needed per 6/27 los

## 2017-12-02 LAB — VON WILLEBRAND PANEL
Coagulation Factor VIII: 155 % — ABNORMAL HIGH (ref 56–140)
Ristocetin Co-factor, Plasma: 204 % — ABNORMAL HIGH (ref 50–200)
Von Willebrand Antigen, Plasma: 274 % — ABNORMAL HIGH (ref 50–200)

## 2017-12-02 LAB — COAG STUDIES INTERP REPORT

## 2017-12-05 LAB — VON WILLEBRAND FACTOR MULTIMER

## 2017-12-12 ENCOUNTER — Ambulatory Visit (INDEPENDENT_AMBULATORY_CARE_PROVIDER_SITE_OTHER): Payer: PPO | Admitting: Cardiovascular Disease

## 2017-12-12 ENCOUNTER — Encounter: Payer: Self-pay | Admitting: Cardiovascular Disease

## 2017-12-12 VITALS — BP 112/79 | HR 67 | Ht 72.0 in | Wt 277.2 lb

## 2017-12-12 DIAGNOSIS — R9389 Abnormal findings on diagnostic imaging of other specified body structures: Secondary | ICD-10-CM

## 2017-12-12 DIAGNOSIS — R0600 Dyspnea, unspecified: Secondary | ICD-10-CM

## 2017-12-12 DIAGNOSIS — I2510 Atherosclerotic heart disease of native coronary artery without angina pectoris: Secondary | ICD-10-CM

## 2017-12-12 MED ORDER — ROSUVASTATIN CALCIUM 5 MG PO TABS: 5.0000 mg | ORAL_TABLET | ORAL | 3 refills | Status: DC

## 2017-12-12 NOTE — Assessment & Plan Note (Signed)
History of hyperlipidemia intolerant to statin therapy.  His LDL checked on 08/08/2017 was 153.  I am going to start Crestor 5 mg 3 times a week and we will recheck a lipid liver profile in 3 months.  If he is intolerant to that he may be a candidate for Repatha.

## 2017-12-12 NOTE — Assessment & Plan Note (Signed)
History of dyspnea probably related echo performed essentially normal with grade 1 diastolic dysfunction and mild pulmonary hypertension.

## 2017-12-12 NOTE — Assessment & Plan Note (Signed)
History of coronary calcium on chest CT with negative Myoview 05/20/2015.

## 2017-12-12 NOTE — Progress Notes (Signed)
12/12/2017 Joshua Moran   15-Dec-1945  [id number]  Primary Physician Lawerance Cruel, MD Primary Cardiologist: Lorretta Harp MD Lupe Carney, [place]  HPI:  Joshua Moran is a 72 y.o.  moderately overweight married Caucasian male father of 3 children, grandfather of 3 grandchildren referred by Dr. Melinda Crutch for cardiovascular evaluation because of coronary calcium found on chest CT done for other reasons.  Is accompanied by his wife and friend today.  I last saw him in the office 05/05/2015.  He is retired Teacher, English as a foreign language for [employer]. His only cardiac risk factor is mild hyperlipidemia with statin intolerance. There is no family history. He has never smoked. He has never had a heart attack or stroke. He denies chest pain but does have mild dyspnea on exertion probably related to poor exercise tolerance from inactivity. He was seen at the hospital during a UTI and has apparently had asymmetric upper extremity blood pressures. A CT scan done of his chest suggested that he had calcium in his left main and RCA.  A Myoview stress test performed 05/20/2015 was normal and recent 2D echo performed 09/27/2017 showed normal LV systolic function, grade 1 diastolic dysfunction and mild pulmonary hypertension.  He is somewhat deconditioned.  He requires bilateral knee replacement left first by Dr. Reynaldo Minium which he will be cleared for at low risk.  He also has elevated LDL at 153 measured on 08/08/2017 but states that he is statin intolerant. Current Meds  Medication Sig  . acetaminophen (TYLENOL) 500 MG tablet Take 1,000 mg by mouth every 6 (six) hours as needed for mild pain, moderate pain, fever or headache.  Marland Kitchen aspirin 81 MG tablet Take 81 mg by mouth daily with breakfast.  . brimonidine (ALPHAGAN) 0.15 % ophthalmic solution Place 1 drop into the right eye 2 (two) times daily.  . cetirizine (ZYRTEC) 10 MG tablet Take 10 mg by mouth every evening.  . ferrous sulfate 325 (65 FE) MG tablet Take 325  mg by mouth daily with breakfast.  . finasteride (PROSCAR) 5 MG tablet Take 5 mg by mouth daily.  . fluticasone (FLONASE) 50 MCG/ACT nasal spray Place 2 sprays into both nostrils at bedtime.  . Glucosamine 750 MG TABS Take 1 tablet by mouth 2 (two) times daily.  Marland Kitchen ketorolac (ACULAR) 0.4 % SOLN Place 1 drop into both eyes at bedtime.  . Multiple Vitamins-Minerals (CENTRUM SILVER ADULT 50+) TABS Take 1 tablet by mouth daily with breakfast.  . sildenafil (VIAGRA) 25 MG tablet Take 25 mg by mouth as needed for erectile dysfunction.  . tamsulosin (FLOMAX) 0.4 MG CAPS capsule Take 0.4 mg by mouth daily after supper.     Allergies  Allergen Reactions  . Penicillins Other (See Comments)    Very flushed  Has patient had a PCN reaction causing immediate rash, facial/tongue/throat swelling, SOB or lightheadedness with hypotension: No Has patient had a PCN reaction causing severe rash involving mucus membranes or skin necrosis: No Has patient had a PCN reaction that required hospitalization No Has patient had a PCN reaction occurring within the last 10 years: No If all of the above answers are "NO", then may proceed with Cephalosporin use.   . Statins Other (See Comments)    Pt c/o muscle aches with use of statins.    Social History   Socioeconomic History  . Marital status: Married    Spouse name: Not on file  . Number of children: Not on file  .  Years of education: Not on file  . Highest education level: Not on file  Occupational History  . Not on file  Social Needs  . Financial resource strain: Not on file  . Food insecurity:    Worry: Not on file    Inability: Not on file  . Transportation needs:    Medical: Not on file    Non-medical: Not on file  Tobacco Use  . Smoking status: Never Smoker  . Smokeless tobacco: Never Used  Substance and Sexual Activity  . Alcohol use: Yes    Comment: occasional  . Drug use: No  . Sexual activity: Not on file  Lifestyle  . Physical  activity:    Days per week: Not on file    Minutes per session: Not on file  . Stress: Not on file  Relationships  . Social connections:    Talks on phone: Not on file    Gets together: Not on file    Attends religious service: Not on file    Active member of club or organization: Not on file    Attends meetings of clubs or organizations: Not on file    Relationship status: Not on file  . Intimate partner violence:    Fear of current or ex partner: Not on file    Emotionally abused: Not on file    Physically abused: Not on file    Forced sexual activity: Not on file  Other Topics Concern  . Not on file  Social History Narrative  . Not on file     Review of Systems: General: negative for chills, fever, night sweats or weight changes.  Cardiovascular: negative for chest pain, dyspnea on exertion, edema, orthopnea, palpitations, paroxysmal nocturnal dyspnea or shortness of breath Dermatological: negative for rash Respiratory: negative for cough or wheezing Urologic: negative for hematuria Abdominal: negative for nausea, vomiting, diarrhea, bright red blood per rectum, melena, or hematemesis Neurologic: negative for visual changes, syncope, or dizziness All other systems reviewed and are otherwise negative except as noted above.    Blood pressure 112/79, pulse 67, height 6' (1.829 m), weight 277 lb 3.2 oz (125.7 kg).  General appearance: alert and no distress Neck: no adenopathy, no carotid bruit, no JVD, supple, symmetrical, trachea midline and thyroid not enlarged, symmetric, no tenderness/mass/nodules Lungs: clear to auscultation bilaterally Heart: regular rate and rhythm, S1, S2 normal, no murmur, click, rub or gallop Extremities: extremities normal, atraumatic, no cyanosis or edema Pulses: 2+ and symmetric Skin: Skin color, texture, turgor normal. No rashes or lesions Neurologic: Alert and oriented X 3, normal strength and tone. Normal symmetric reflexes. Normal  coordination and gait  EKG sinus rhythm at 67 without ST or T wave changes.  Personally reviewed this EKG  ASSESSMENT AND PLAN:   Hyperlipidemia History of hyperlipidemia intolerant to statin therapy.  His LDL checked on 08/08/2017 was 153.  I am going to start Crestor 5 mg 3 times a week and we will recheck a lipid liver profile in 3 months.  If he is intolerant to that he may be a candidate for Repatha.  Abnormal CT scan of heart History of coronary calcium on chest CT with negative Myoview 05/20/2015.  Dyspnea History of dyspnea probably related echo performed essentially normal with grade 1 diastolic dysfunction and mild pulmonary hypertension.      Lorretta Harp MD FACP,FACC,FAHA, [facility] 12/12/2017 9:28 AM

## 2017-12-12 NOTE — Patient Instructions (Signed)
Medication Instructions:   START ROSUVASTATIN 5 MG 1 TABLET EVERY OTHER DAY   Labwork:  Your physician recommends that you return for lab work in: [place]:  Your physician wants you to follow-up in: [place] will receive a reminder letter in the mail two months in advance. If you don't receive a letter, please call our office to schedule the follow-up appointment.   If you need a refill on your cardiac medications before your next appointment, please call your pharmacy.

## 2017-12-14 DIAGNOSIS — D649 Anemia, unspecified: Secondary | ICD-10-CM | POA: Diagnosis not present

## 2017-12-14 DIAGNOSIS — Z01818 Encounter for other preprocedural examination: Secondary | ICD-10-CM | POA: Diagnosis not present

## 2017-12-14 DIAGNOSIS — M179 Osteoarthritis of knee, unspecified: Secondary | ICD-10-CM | POA: Diagnosis not present

## 2017-12-22 NOTE — Progress Notes (Signed)
12-12-17 (Epic) Cardiac clearance from Dr. Gwenlyn Found, EKG 11-08-17  Surgical clearance from Dr. Harrington Challenger on chart 10-02-17 (Epic) ECHO 09-05-17 (Epic) CXR

## 2017-12-22 NOTE — Patient Instructions (Addendum)
Joshua Moran  12/22/2017   Your procedure is scheduled on: 01-01-18   Report to [facility] Main  Entrance    Report to Admitting at 8:00 AM    Call this number if you have problems the morning of surgery [phone]   Remember: Do not eat food or drink liquids :After Midnight.     Take these medicines the morning of surgery with A SIP OF WATER: You can bring your eyedrops and nasal spray to use as needed                                You may not have any metal on your body including hair pins and              piercings  Do not wear jewelry, lotions, powders or cologne, deodorant             Men may shave face and neck.   Do not bring valuables to the hospital. [place].  Contacts, dentures or bridgework may not be worn into surgery.  Leave suitcase in the car. After surgery it may be brought to your room.       Special Instructions: N/A              Please read over the following fact sheets you were given: _____________________________________________________________________             [facility] - Preparing for Surgery Before surgery, you can play an important role.  Because skin is not sterile, your skin needs to be as free of germs as possible.  You can reduce the number of germs on your skin by washing with CHG (chlorahexidine gluconate) soap before surgery.  CHG is an antiseptic cleaner which kills germs and bonds with the skin to continue killing germs even after washing. Please DO NOT use if you have an allergy to CHG or antibacterial soaps.  If your skin becomes reddened/irritated stop using the CHG and inform your nurse when you arrive at Short Stay. Do not shave (including legs and underarms) for at least 48 hours prior to the first CHG shower.  You may shave your face/neck. Please follow these instructions carefully:  1.  Shower with CHG Soap the night before surgery and the  morning of  Surgery.  2.  If you choose to wash your hair, wash your hair first as usual with your  normal  shampoo.  3.  After you shampoo, rinse your hair and body thoroughly to remove the  shampoo.                           4.  Use CHG as you would any other liquid soap.  You can apply chg directly  to the skin and wash                       Gently with a scrungie or clean washcloth.  5.  Apply the CHG Soap to your body ONLY FROM THE NECK DOWN.   Do not use on face/ open  Wound or open sores. Avoid contact with eyes, ears mouth and genitals (private parts).                       Wash face,  Genitals (private parts) with your normal soap.             6.  Wash thoroughly, paying special attention to the area where your surgery  will be performed.  7.  Thoroughly rinse your body with warm water from the neck down.  8.  DO NOT shower/wash with your normal soap after using and rinsing off  the CHG Soap.                9.  Pat yourself dry with a clean towel.            10.  Wear clean pajamas.            11.  Place clean sheets on your bed the night of your first shower and do not  sleep with pets. Day of Surgery : Do not apply any lotions/deodorants the morning of surgery.  Please wear clean clothes to the hospital/surgery center.  FAILURE TO FOLLOW THESE INSTRUCTIONS MAY RESULT IN THE CANCELLATION OF YOUR SURGERY PATIENT SIGNATURE_________________________________  NURSE SIGNATURE__________________________________  ________________________________________________________________________   Adam Phenix  An incentive spirometer is a tool that can help keep your lungs clear and active. This tool measures how well you are filling your lungs with each breath. Taking long deep breaths may help reverse or decrease the chance of developing breathing (pulmonary) problems (especially infection) following:  A long period of time when you are unable to move or be active. BEFORE  THE PROCEDURE   If the spirometer includes an indicator to show your best effort, your nurse or respiratory therapist will set it to a desired goal.  If possible, sit up straight or lean slightly forward. Try not to slouch.  Hold the incentive spirometer in an upright position. INSTRUCTIONS FOR USE  1. Sit on the edge of your bed if possible, or sit up as far as you can in bed or on a chair. 2. Hold the incentive spirometer in an upright position. 3. Breathe out normally. 4. Place the mouthpiece in your mouth and seal your lips tightly around it. 5. Breathe in slowly and as deeply as possible, raising the piston or the ball toward the top of the column. 6. Hold your breath for 3-5 seconds or for as long as possible. Allow the piston or ball to fall to the bottom of the column. 7. Remove the mouthpiece from your mouth and breathe out normally. 8. Rest for a few seconds and repeat Steps 1 through 7 at least 10 times every 1-2 hours when you are awake. Take your time and take a few normal breaths between deep breaths. 9. The spirometer may include an indicator to show your best effort. Use the indicator as a goal to work toward during each repetition. 10. After each set of 10 deep breaths, practice coughing to be sure your lungs are clear. If you have an incision (the cut made at the time of surgery), support your incision when coughing by placing a pillow or rolled up towels firmly against it. Once you are able to get out of bed, walk around indoors and cough well. You may stop using the incentive spirometer when instructed by your caregiver.  RISKS AND COMPLICATIONS  Take your time so you do not get  dizzy or light-headed.  If you are in pain, you may need to take or ask for pain medication before doing incentive spirometry. It is harder to take a deep breath if you are having pain. AFTER USE  Rest and breathe slowly and easily.  It can be helpful to keep track of a log of your progress.  Your caregiver can provide you with a simple table to help with this. If you are using the spirometer at home, follow these instructions: [place] IF:   You are having difficultly using the spirometer.  You have trouble using the spirometer as often as instructed.  Your pain medication is not giving enough relief while using the spirometer.  You develop fever of 100.5 F (38.1 C) or higher. SEEK IMMEDIATE MEDICAL CARE IF:   You cough up bloody sputum that had not been present before.  You develop fever of 102 F (38.9 C) or greater.  You develop worsening pain at or near the incision site. MAKE SURE YOU:   Understand these instructions.  Will watch your condition.  Will get help right away if you are not doing well or get worse. Document Released: 10/03/2006 Document Revised: 08/15/2011 Document Reviewed: 12/04/2006 Exit[employer] Patient Information 2014 Exit[employer], [place].   ________________________________________________________________________  WHAT IS A BLOOD TRANSFUSION? Blood Transfusion Information  A transfusion is the replacement of blood or some of its parts. Blood is made up of multiple cells which provide different functions.  Red blood cells carry oxygen and are used for blood loss replacement.  White blood cells fight against infection.  Platelets control bleeding.  Plasma helps clot blood.  Other blood products are available for specialized needs, such as hemophilia or other clotting disorders. BEFORE THE TRANSFUSION  Who gives blood for transfusions?   Healthy volunteers who are fully evaluated to make sure their blood is safe. This is blood bank blood. Transfusion therapy is the safest it has ever been in the practice of medicine. Before blood is taken from a donor, a complete history is taken to make sure that person has no history of diseases nor engages in risky social behavior (examples are intravenous drug use or sexual activity with multiple  partners). The donor's travel history is screened to minimize risk of transmitting infections, such as malaria. The donated blood is tested for signs of infectious diseases, such as HIV and hepatitis. The blood is then tested to be sure it is compatible with you in order to minimize the chance of a transfusion reaction. If you or a relative donates blood, this is often done in anticipation of surgery and is not appropriate for emergency situations. It takes many days to process the donated blood. RISKS AND COMPLICATIONS Although transfusion therapy is very safe and saves many lives, the main dangers of transfusion include:   Getting an infectious disease.  Developing a transfusion reaction. This is an allergic reaction to something in the blood you were given. Every precaution is taken to prevent this. The decision to have a blood transfusion has been considered carefully by your caregiver before blood is given. Blood is not given unless the benefits outweigh the risks. AFTER THE TRANSFUSION  Right after receiving a blood transfusion, you will usually feel much better and more energetic. This is especially true if your red blood cells have gotten low (anemic). The transfusion raises the level of the red blood cells which carry oxygen, and this usually causes an energy increase.  The nurse administering the transfusion will  monitor you carefully for complications. HOME CARE INSTRUCTIONS  No special instructions are needed after a transfusion. You may find your energy is better. Speak with your caregiver about any limitations on activity for underlying diseases you may have. SEEK MEDICAL CARE IF:   Your condition is not improving after your transfusion.  You develop redness or irritation at the intravenous (IV) site. SEEK IMMEDIATE MEDICAL CARE IF:  Any of the following symptoms occur over the next 12 hours:  Shaking chills.  You have a temperature by mouth above 102 F (38.9 C), not  controlled by medicine.  Chest, back, or muscle pain.  People around you feel you are not acting correctly or are confused.  Shortness of breath or difficulty breathing.  Dizziness and fainting.  You get a rash or develop hives.  You have a decrease in urine output.  Your urine turns a dark color or changes to pink, red, or brown. Any of the following symptoms occur over the next 10 days:  You have a temperature by mouth above 102 F (38.9 C), not controlled by medicine.  Shortness of breath.  Weakness after normal activity.  The white part of the eye turns yellow (jaundice).  You have a decrease in the amount of urine or are urinating less often.  Your urine turns a dark color or changes to pink, red, or brown. Document Released: 05/20/2000 Document Revised: 08/15/2011 Document Reviewed: 01/07/2008 [facility] Patient Information 2014 [place], [place].  _______________________________________________________________________

## 2017-12-25 ENCOUNTER — Other Ambulatory Visit: Payer: Self-pay

## 2017-12-25 ENCOUNTER — Encounter (HOSPITAL_COMMUNITY)
Admission: RE | Admit: 2017-12-25 | Discharge: 2017-12-25 | Disposition: A | Discharge status: Home / Self Care | Payer: PPO | Source: Ambulatory Visit | Attending: Orthopedic Surgery | Admitting: Orthopedic Surgery

## 2017-12-25 ENCOUNTER — Encounter (HOSPITAL_COMMUNITY): Payer: Self-pay

## 2017-12-25 DIAGNOSIS — M1712 Unilateral primary osteoarthritis, left knee: Secondary | ICD-10-CM | POA: Diagnosis not present

## 2017-12-25 DIAGNOSIS — Z01812 Encounter for preprocedural laboratory examination: Secondary | ICD-10-CM | POA: Diagnosis not present

## 2017-12-25 HISTORY — DX: Unspecified osteoarthritis, unspecified site: M19.90

## 2017-12-25 HISTORY — DX: Gastro-esophageal reflux disease without esophagitis: K21.9

## 2017-12-25 HISTORY — DX: Adverse effect of unspecified anesthetic, initial encounter: T41.45XA

## 2017-12-25 HISTORY — DX: Anemia, unspecified: D64.9

## 2017-12-25 HISTORY — DX: Other complications of anesthesia, initial encounter: T88.59XA

## 2017-12-25 LAB — COMPREHENSIVE METABOLIC PANEL
ALK PHOS: 67 U/L (ref 38–126)
ALT: 17 U/L (ref 0–44)
ANION GAP: 5 (ref 5–15)
AST: 21 U/L (ref 15–41)
Albumin: 3.5 g/dL (ref 3.5–5.0)
BUN: 22 mg/dL (ref 8–23)
CALCIUM: 9 mg/dL (ref 8.9–10.3)
CO2: 27 mmol/L (ref 22–32)
Chloride: 109 mmol/L (ref 98–111)
Creatinine, Ser: 0.79 mg/dL (ref 0.61–1.24)
Glucose, Bld: 96 mg/dL (ref 70–99)
Potassium: 4.4 mmol/L (ref 3.5–5.1)
Sodium: 141 mmol/L (ref 135–145)
Total Bilirubin: 0.8 mg/dL (ref 0.3–1.2)
Total Protein: 6.8 g/dL (ref 6.5–8.1)

## 2017-12-25 LAB — APTT: aPTT: 32 seconds (ref 24–36)

## 2017-12-25 LAB — CBC
HCT: 41.4 % (ref 39.0–52.0)
Hemoglobin: 13.4 g/dL (ref 13.0–17.0)
MCH: 30 pg (ref 26.0–34.0)
MCHC: 32.4 g/dL (ref 30.0–36.0)
MCV: 92.6 fL (ref 78.0–100.0)
PLATELETS: 227 10*3/uL (ref 150–400)
RBC: 4.47 MIL/uL (ref 4.22–5.81)
RDW: 14.6 % (ref 11.5–15.5)
WBC: 5.2 10*3/uL (ref 4.0–10.5)

## 2017-12-25 LAB — URINALYSIS, ROUTINE W REFLEX MICROSCOPIC
Bilirubin Urine: NEGATIVE
GLUCOSE, UA: NEGATIVE mg/dL
HGB URINE DIPSTICK: NEGATIVE
KETONES UR: NEGATIVE mg/dL
LEUKOCYTES UA: NEGATIVE
Nitrite: NEGATIVE
PH: 5 (ref 5.0–8.0)
PROTEIN: NEGATIVE mg/dL
Specific Gravity, Urine: 1.019 (ref 1.005–1.030)

## 2017-12-25 LAB — PROTIME-INR
INR: 1.14
PROTHROMBIN TIME: 14.5 s (ref 11.4–15.2)

## 2017-12-25 LAB — SURGICAL PCR SCREEN
MRSA, PCR: NEGATIVE
Staphylococcus aureus: NEGATIVE

## 2017-12-25 LAB — ABO/RH: ABO/RH(D): A POS

## 2017-12-27 NOTE — H&P (Signed)
TOTAL KNEE ADMISSION H&P  Patient is being admitted for left total knee arthroplasty.  Subjective:  Chief Complaint:left knee pain.  HPI: Joshua Moran, 72 y.o. male, has a history of pain and functional disability in the left knee due to arthritis and has failed non-surgical conservative treatments for greater than 12 weeks to includeNSAID's and/or analgesics, corticosteriod injections, flexibility and strengthening excercises and activity modification.  Onset of symptoms was gradual, starting 5 years ago with gradually worsening course since that time. The patient noted prior procedures on the knee to include  arthroscopy and menisectomy on the left knee(s).  Patient currently rates pain in the left knee(s) at 7 out of 10 with activity. Patient has night pain, worsening of pain with activity and weight bearing, pain that interferes with activities of daily living, pain with passive range of motion, crepitus and joint swelling.  Patient has evidence of periarticular osteophytes and joint space narrowing by imaging studies.  There is no active infection.  Patient Active Problem List   Diagnosis Date Noted  . Dyspnea 09/19/2017  . Hyperlipidemia 05/05/2015  . Abnormal CT scan of heart 05/05/2015   Past Medical History:  Diagnosis Date  . Abnormal screening CT of chest    coronary calcium in left main and RCA  . Anemia   . Arthritis   . Complication of anesthesia    Pt reports difficult urinating  . GERD (gastroesophageal reflux disease)   . Glaucoma   . Hyperlipidemia    statin intolerant    Past Surgical History:  Procedure Laterality Date  . KNEE ARTHROSCOPY W/ DEBRIDEMENT    . RETINAL DETACHMENT SURGERY Bilateral   . VASECTOMY       Allergies  Allergen Reactions  . Penicillins Other (See Comments)    Very flushed  Has patient had a PCN reaction causing immediate rash, facial/tongue/throat swelling, SOB or lightheadedness with hypotension: No Has patient had a PCN reaction  causing severe rash involving mucus membranes or skin necrosis: No Has patient had a PCN reaction that required hospitalization No Has patient had a PCN reaction occurring within the last 10 years: No If all of the above answers are "NO", then may proceed with Cephalosporin use.   . Statins Other (See Comments)    Pt c/o muscle aches with use of statins.    Social History   Tobacco Use  . Smoking status: Never Smoker  . Smokeless tobacco: Never Used  Substance Use Topics  . Alcohol use: Yes    Comment: Rare    Family History  Problem Relation Age of Onset  . Lung cancer Mother        smoker     Review of Systems  Constitutional: Negative.   HENT: Negative.   Eyes: Negative.   Respiratory: Negative.   Cardiovascular: Negative.   Gastrointestinal: Positive for heartburn. Negative for abdominal pain, blood in stool, constipation, diarrhea, melena, nausea and vomiting.  Genitourinary: Negative.   Musculoskeletal: Positive for joint pain and myalgias. Negative for back pain, falls and neck pain.  Skin: Negative.   Neurological: Negative.   Endo/Heme/Allergies: Negative.   Psychiatric/Behavioral: Negative.     Objective:  Physical Exam  Constitutional: He is oriented to person, place, and time. He appears well-developed. No distress.  Morbidly obese  HENT:  Head: Normocephalic and atraumatic.  Right Ear: External ear normal.  Left Ear: External ear normal.  Nose: Nose normal.  Mouth/Throat: Oropharynx is clear and moist.  Eyes: Conjunctivae and EOM are normal.  Neck: Normal range of motion. Neck supple.  Cardiovascular: Normal rate, regular rhythm, normal heart sounds and intact distal pulses.  No murmur heard. Respiratory: Effort normal and breath sounds normal. No respiratory distress. He has no wheezes.  GI: Soft. Bowel sounds are normal. He exhibits no distension. There is no tenderness.  Musculoskeletal:       Right hip: Normal.       Left hip: Normal.   Musculoskeletal Right Knee Exam:  No effusion.  Range of motion is 0-130 degrees.  Moderate crepitus on range of motion of the knee.  Some medial greater than lateral joint line tenderness.  Stable knee  Left Knee Exam:  Varus deformity. No effusion.  Range of motion is 5-125 degrees.  No crepitus on range of motion of the knee.  Some medial joint line tenderness. No lateral joint line tenderness.  Stable knee.  Neurological: He is alert and oriented to person, place, and time. He has normal strength. No sensory deficit.  Skin: No rash noted. He is not diaphoretic. No erythema.  Psychiatric: He has a normal mood and affect. His behavior is normal.    Ht: 6 ft  Wt: 273 lbs  BMI: 37  BP: 114/76 sitting R arm  Pulse: 72 bpm   Imaging Review Plain radiographs demonstrate severe degenerative joint disease of the left knee(s). The overall alignment ismild varus. The bone quality appears to be good for age and reported activity level.   Preoperative templating of the joint replacement has been completed, documented, and submitted to the Operating Room personnel in order to optimize intra-operative equipment management.   Anticipated LOS equal to or greater than 2 midnights due to - Age 79 and older with one or more of the following:  - Obesity  - Expected need for hospital services (PT, OT, Nursing) required for safe  discharge  - Anticipated need for postoperative skilled nursing care or inpatient rehab  - Active co-morbidities: Coronary Artery Disease OR   - Unanticipated findings during/Post Surgery: None  - Patient is a high risk of re-admission due to: None     Assessment/Plan:  End stage primary osteoarthritis, left knee   The patient history, physical examination, clinical judgment of the provider and imaging studies are consistent with end stage degenerative joint disease of the left knee(s) and total knee arthroplasty is deemed medically necessary. The  treatment options including medical management, injection therapy arthroscopy and arthroplasty were discussed at length. The risks and benefits of total knee arthroplasty were presented and reviewed. The risks due to aseptic loosening, infection, stiffness, patella tracking problems, thromboembolic complications and other imponderables were discussed. The patient acknowledged the explanation, agreed to proceed with the plan and consent was signed. Patient is being admitted for inpatient treatment for surgery, pain control, PT, OT, prophylactic antibiotics, VTE prophylaxis, progressive ambulation and ADL's and discharge planning. The patient is planning to be discharged home with outpatient therapy.   Therapy Plans: outpatient therapy at [facility] Disposition: Home with wife Planned DVT prophylaxis: aspirin 325mg  BID DME needed: 3-n-1 PCP: Dr. Melinda Crutch Cardio: Dr. Alvester Chou  Other: history of urinary retention after surgery TXA IV   Ardeen Jourdain, PA-C

## 2017-12-31 MED ORDER — BUPIVACAINE LIPOSOME 1.3 % IJ SUSP
20.0000 mL | INTRAMUSCULAR | Status: DC
  Filled 2017-12-31: qty 20

## 2017-12-31 MED ORDER — VANCOMYCIN HCL 10 G IV SOLR
1500.0000 mg | INTRAVENOUS | Status: AC
  Administered 2018-01-01: 1500 mg via INTRAVENOUS
  Filled 2017-12-31: qty 1500

## 2018-01-01 ENCOUNTER — Encounter (HOSPITAL_COMMUNITY)
Admission: RE | Disposition: A | Discharge status: Home / Self Care | Payer: Self-pay | Source: Home / Self Care | Attending: Orthopedic Surgery

## 2018-01-01 ENCOUNTER — Other Ambulatory Visit: Payer: Self-pay

## 2018-01-01 ENCOUNTER — Inpatient Hospital Stay (HOSPITAL_COMMUNITY): Payer: PPO | Admitting: Registered Nurse

## 2018-01-01 ENCOUNTER — Encounter (HOSPITAL_COMMUNITY): Payer: Self-pay | Admitting: *Deleted

## 2018-01-01 ENCOUNTER — Inpatient Hospital Stay (HOSPITAL_COMMUNITY)
Admission: RE | Admit: 2018-01-01 | Discharge: 2018-01-02 | DRG: 470 | Disposition: A | Discharge status: Home / Self Care | Payer: PPO | Attending: Orthopedic Surgery | Admitting: Orthopedic Surgery

## 2018-01-01 DIAGNOSIS — G8918 Other acute postprocedural pain: Secondary | ICD-10-CM | POA: Diagnosis not present

## 2018-01-01 DIAGNOSIS — Z6837 Body mass index (BMI) 37.0-37.9, adult: Secondary | ICD-10-CM | POA: Diagnosis not present

## 2018-01-01 DIAGNOSIS — E785 Hyperlipidemia, unspecified: Secondary | ICD-10-CM | POA: Diagnosis present

## 2018-01-01 DIAGNOSIS — K219 Gastro-esophageal reflux disease without esophagitis: Secondary | ICD-10-CM | POA: Diagnosis present

## 2018-01-01 DIAGNOSIS — M170 Bilateral primary osteoarthritis of knee: Secondary | ICD-10-CM | POA: Diagnosis not present

## 2018-01-01 DIAGNOSIS — D649 Anemia, unspecified: Secondary | ICD-10-CM | POA: Diagnosis not present

## 2018-01-01 DIAGNOSIS — Z880 Allergy status to penicillin: Secondary | ICD-10-CM | POA: Diagnosis not present

## 2018-01-01 DIAGNOSIS — Z888 Allergy status to other drugs, medicaments and biological substances status: Secondary | ICD-10-CM | POA: Diagnosis not present

## 2018-01-01 DIAGNOSIS — M1710 Unilateral primary osteoarthritis, unspecified knee: Secondary | ICD-10-CM

## 2018-01-01 DIAGNOSIS — M179 Osteoarthritis of knee, unspecified: Secondary | ICD-10-CM | POA: Diagnosis present

## 2018-01-01 DIAGNOSIS — M25562 Pain in left knee: Secondary | ICD-10-CM | POA: Diagnosis present

## 2018-01-01 HISTORY — PX: TOTAL KNEE ARTHROPLASTY: SHX125

## 2018-01-01 LAB — TYPE AND SCREEN
ABO/RH(D): A POS
Antibody Screen: NEGATIVE

## 2018-01-01 SURGERY — ARTHROPLASTY, KNEE, TOTAL
Anesthesia: Regional | Site: Knee | Laterality: Left

## 2018-01-01 MED ORDER — TAMSULOSIN HCL 0.4 MG PO CAPS
0.4000 mg | ORAL_CAPSULE | Freq: Every day | ORAL | Status: DC
  Administered 2018-01-01: 0.4 mg via ORAL
  Filled 2018-01-01: qty 1

## 2018-01-01 MED ORDER — GABAPENTIN 300 MG PO CAPS
300.0000 mg | ORAL_CAPSULE | Freq: Three times a day (TID) | ORAL | Status: DC
  Administered 2018-01-01 – 2018-01-02 (×3): 300 mg via ORAL
  Filled 2018-01-01 (×3): qty 1

## 2018-01-01 MED ORDER — METOCLOPRAMIDE HCL 5 MG PO TABS: 5.0000 mg | ORAL_TABLET | Freq: Three times a day (TID) | ORAL | Status: DC | PRN

## 2018-01-01 MED ORDER — METHOCARBAMOL 500 MG PO TABS
500.0000 mg | ORAL_TABLET | Freq: Four times a day (QID) | ORAL | Status: DC | PRN
  Administered 2018-01-02 (×2): 500 mg via ORAL
  Filled 2018-01-01 (×2): qty 1

## 2018-01-01 MED ORDER — FLUTICASONE PROPIONATE 50 MCG/ACT NA SUSP
2.0000 | Freq: Two times a day (BID) | NASAL | Status: DC
  Administered 2018-01-01 – 2018-01-02 (×2): 2 via NASAL
  Filled 2018-01-01: qty 16

## 2018-01-01 MED ORDER — ONDANSETRON HCL 4 MG/2ML IJ SOLN: 4.0000 mg | Freq: Four times a day (QID) | INTRAMUSCULAR | Status: DC | PRN

## 2018-01-01 MED ORDER — LORATADINE 10 MG PO TABS
10.0000 mg | ORAL_TABLET | Freq: Every evening | ORAL | Status: DC
  Administered 2018-01-01: 10 mg via ORAL
  Filled 2018-01-01: qty 1

## 2018-01-01 MED ORDER — HYDROMORPHONE HCL 1 MG/ML IJ SOLN: 0.2500 mg | INTRAMUSCULAR | Status: DC | PRN

## 2018-01-01 MED ORDER — PHENOL 1.4 % MT LIQD
1.0000 | OROMUCOSAL | Status: DC | PRN
  Filled 2018-01-01: qty 177

## 2018-01-01 MED ORDER — DEXAMETHASONE SODIUM PHOSPHATE 10 MG/ML IJ SOLN
INTRAMUSCULAR | Status: AC
  Filled 2018-01-01: qty 1

## 2018-01-01 MED ORDER — BRIMONIDINE TARTRATE 0.15 % OP SOLN
1.0000 [drp] | Freq: Two times a day (BID) | OPHTHALMIC | Status: DC
  Administered 2018-01-01 – 2018-01-02 (×2): 1 [drp] via OPHTHALMIC
  Filled 2018-01-01: qty 5

## 2018-01-01 MED ORDER — FENTANYL CITRATE (PF) 100 MCG/2ML IJ SOLN
50.0000 ug | INTRAMUSCULAR | Status: DC
  Administered 2018-01-01: 50 ug via INTRAVENOUS
  Filled 2018-01-01: qty 2

## 2018-01-01 MED ORDER — PROPOFOL 10 MG/ML IV BOLUS
INTRAVENOUS | Status: DC | PRN
  Administered 2018-01-01 (×2): 20 mg via INTRAVENOUS

## 2018-01-01 MED ORDER — BUPIVACAINE HCL (PF) 0.75 % IJ SOLN
INTRAMUSCULAR | Status: DC | PRN
  Administered 2018-01-01: 2 mL via INTRATHECAL

## 2018-01-01 MED ORDER — SODIUM CHLORIDE 0.9 % IR SOLN
Status: DC | PRN
  Administered 2018-01-01: 1000 mL

## 2018-01-01 MED ORDER — METHYLPREDNISOLONE ACETATE 40 MG/ML IJ SUSP
INTRAMUSCULAR | Status: AC
  Filled 2018-01-01: qty 2

## 2018-01-01 MED ORDER — SODIUM CHLORIDE 0.9 % IJ SOLN
INTRAMUSCULAR | Status: DC | PRN
  Administered 2018-01-01: 60 mL

## 2018-01-01 MED ORDER — METHOCARBAMOL 1000 MG/10ML IJ SOLN
500.0000 mg | Freq: Four times a day (QID) | INTRAVENOUS | Status: DC | PRN
  Filled 2018-01-01: qty 5

## 2018-01-01 MED ORDER — ACETAMINOPHEN 500 MG PO TABS
1000.0000 mg | ORAL_TABLET | Freq: Four times a day (QID) | ORAL | Status: AC
  Administered 2018-01-01 – 2018-01-02 (×4): 1000 mg via ORAL
  Filled 2018-01-01 (×4): qty 2

## 2018-01-01 MED ORDER — LIDOCAINE 2% (20 MG/ML) 5 ML SYRINGE
INTRAMUSCULAR | Status: AC
  Filled 2018-01-01: qty 5

## 2018-01-01 MED ORDER — PROMETHAZINE HCL 25 MG/ML IJ SOLN: 6.2500 mg | INTRAMUSCULAR | Status: DC | PRN

## 2018-01-01 MED ORDER — BISACODYL 10 MG RE SUPP: 10.0000 mg | Freq: Every day | RECTAL | Status: DC | PRN

## 2018-01-01 MED ORDER — OXYCODONE HCL 5 MG PO TABS
5.0000 mg | ORAL_TABLET | ORAL | Status: DC | PRN
  Administered 2018-01-01: 10 mg via ORAL
  Administered 2018-01-01: 5 mg via ORAL
  Administered 2018-01-02 (×4): 10 mg via ORAL
  Filled 2018-01-01 (×4): qty 2
  Filled 2018-01-01: qty 1

## 2018-01-01 MED ORDER — ASPIRIN EC 325 MG PO TBEC
325.0000 mg | DELAYED_RELEASE_TABLET | Freq: Two times a day (BID) | ORAL | Status: DC
  Administered 2018-01-02: 325 mg via ORAL
  Filled 2018-01-01: qty 1

## 2018-01-01 MED ORDER — KETOROLAC TROMETHAMINE 0.5 % OP SOLN
1.0000 [drp] | Freq: Two times a day (BID) | OPHTHALMIC | Status: DC
  Administered 2018-01-01 – 2018-01-02 (×2): 1 [drp] via OPHTHALMIC
  Filled 2018-01-01: qty 5

## 2018-01-01 MED ORDER — DEXAMETHASONE SODIUM PHOSPHATE 10 MG/ML IJ SOLN
8.0000 mg | Freq: Once | INTRAMUSCULAR | Status: AC
  Administered 2018-01-01: 10 mg via INTRAVENOUS

## 2018-01-01 MED ORDER — GABAPENTIN 300 MG PO CAPS
300.0000 mg | ORAL_CAPSULE | Freq: Once | ORAL | Status: AC
  Administered 2018-01-01: 300 mg via ORAL
  Filled 2018-01-01: qty 1

## 2018-01-01 MED ORDER — PROPOFOL 10 MG/ML IV BOLUS
INTRAVENOUS | Status: AC
  Filled 2018-01-01: qty 20

## 2018-01-01 MED ORDER — ACETAMINOPHEN 10 MG/ML IV SOLN
1000.0000 mg | Freq: Four times a day (QID) | INTRAVENOUS | Status: DC
  Administered 2018-01-01: 1000 mg via INTRAVENOUS
  Filled 2018-01-01: qty 100

## 2018-01-01 MED ORDER — DIPHENHYDRAMINE HCL 12.5 MG/5ML PO ELIX: 12.5000 mg | ORAL_SOLUTION | ORAL | Status: DC | PRN

## 2018-01-01 MED ORDER — ONDANSETRON HCL 4 MG PO TABS: 4.0000 mg | ORAL_TABLET | Freq: Four times a day (QID) | ORAL | Status: DC | PRN

## 2018-01-01 MED ORDER — FLEET ENEMA 7-19 GM/118ML RE ENEM: 1.0000 | ENEMA | Freq: Once | RECTAL | Status: DC | PRN

## 2018-01-01 MED ORDER — PROPOFOL 500 MG/50ML IV EMUL
INTRAVENOUS | Status: DC | PRN
  Administered 2018-01-01: 50 ug/kg/min via INTRAVENOUS

## 2018-01-01 MED ORDER — METHYLPREDNISOLONE ACETATE 80 MG/ML IJ SUSP
INTRAMUSCULAR | Status: DC | PRN
  Administered 2018-01-01: 80 mg

## 2018-01-01 MED ORDER — ONDANSETRON HCL 4 MG/2ML IJ SOLN
INTRAMUSCULAR | Status: DC | PRN
  Administered 2018-01-01: 4 mg via INTRAVENOUS

## 2018-01-01 MED ORDER — MENTHOL 3 MG MT LOZG: 1.0000 | LOZENGE | OROMUCOSAL | Status: DC | PRN

## 2018-01-01 MED ORDER — SODIUM CHLORIDE 0.9 % IJ SOLN
INTRAMUSCULAR | Status: AC
  Filled 2018-01-01: qty 10

## 2018-01-01 MED ORDER — DEXAMETHASONE SODIUM PHOSPHATE 10 MG/ML IJ SOLN
10.0000 mg | Freq: Once | INTRAMUSCULAR | Status: AC
  Administered 2018-01-02: 10 mg via INTRAVENOUS
  Filled 2018-01-01: qty 1

## 2018-01-01 MED ORDER — ROPIVACAINE HCL 5 MG/ML IJ SOLN
INTRAMUSCULAR | Status: DC | PRN
  Administered 2018-01-01: 20 mL via PERINEURAL

## 2018-01-01 MED ORDER — STERILE WATER FOR IRRIGATION IR SOLN
Status: DC | PRN
  Administered 2018-01-01: 2000 mL

## 2018-01-01 MED ORDER — METOCLOPRAMIDE HCL 5 MG/ML IJ SOLN: 5.0000 mg | Freq: Three times a day (TID) | INTRAMUSCULAR | Status: DC | PRN

## 2018-01-01 MED ORDER — POLYETHYLENE GLYCOL 3350 17 G PO PACK
17.0000 g | PACK | Freq: Every day | ORAL | Status: DC | PRN
  Administered 2018-01-02: 17 g via ORAL
  Filled 2018-01-01: qty 1

## 2018-01-01 MED ORDER — TRANEXAMIC ACID 1000 MG/10ML IV SOLN
1000.0000 mg | INTRAVENOUS | Status: AC
  Administered 2018-01-01: 1000 mg via INTRAVENOUS
  Filled 2018-01-01: qty 10

## 2018-01-01 MED ORDER — MIDAZOLAM HCL 2 MG/2ML IJ SOLN
1.0000 mg | INTRAMUSCULAR | Status: DC
  Administered 2018-01-01: 2 mg via INTRAVENOUS
  Filled 2018-01-01: qty 2

## 2018-01-01 MED ORDER — TRAMADOL HCL 50 MG PO TABS: 50.0000 mg | ORAL_TABLET | Freq: Four times a day (QID) | ORAL | Status: DC | PRN

## 2018-01-01 MED ORDER — SODIUM CHLORIDE 0.9 % IV SOLN
INTRAVENOUS | Status: DC
  Administered 2018-01-01 – 2018-01-02 (×3): via INTRAVENOUS

## 2018-01-01 MED ORDER — OXYCODONE HCL 5 MG PO TABS
10.0000 mg | ORAL_TABLET | ORAL | Status: DC | PRN
  Filled 2018-01-01: qty 2

## 2018-01-01 MED ORDER — DOCUSATE SODIUM 100 MG PO CAPS
100.0000 mg | ORAL_CAPSULE | Freq: Two times a day (BID) | ORAL | Status: DC
  Administered 2018-01-01 – 2018-01-02 (×2): 100 mg via ORAL
  Filled 2018-01-01 (×2): qty 1

## 2018-01-01 MED ORDER — LACTATED RINGERS IV SOLN
INTRAVENOUS | Status: DC
  Administered 2018-01-01 (×2): via INTRAVENOUS

## 2018-01-01 MED ORDER — PROPOFOL 10 MG/ML IV BOLUS
INTRAVENOUS | Status: AC
  Filled 2018-01-01: qty 60

## 2018-01-01 MED ORDER — SODIUM CHLORIDE 0.9 % IJ SOLN
INTRAMUSCULAR | Status: AC
  Filled 2018-01-01: qty 50

## 2018-01-01 MED ORDER — LIDOCAINE 2% (20 MG/ML) 5 ML SYRINGE
INTRAMUSCULAR | Status: DC | PRN
  Administered 2018-01-01: 60 mg via INTRAVENOUS

## 2018-01-01 MED ORDER — VANCOMYCIN HCL IN DEXTROSE 1-5 GM/200ML-% IV SOLN
1000.0000 mg | Freq: Two times a day (BID) | INTRAVENOUS | Status: AC
  Administered 2018-01-01: 1000 mg via INTRAVENOUS
  Filled 2018-01-01: qty 200

## 2018-01-01 MED ORDER — CHLORHEXIDINE GLUCONATE 4 % EX LIQD: 60.0000 mL | Freq: Once | CUTANEOUS | Status: DC

## 2018-01-01 MED ORDER — ONDANSETRON HCL 4 MG/2ML IJ SOLN
INTRAMUSCULAR | Status: AC
  Filled 2018-01-01: qty 2

## 2018-01-01 MED ORDER — OXYCODONE HCL 5 MG/5ML PO SOLN
5.0000 mg | Freq: Once | ORAL | Status: DC | PRN
  Filled 2018-01-01: qty 5

## 2018-01-01 MED ORDER — HYDROMORPHONE HCL 1 MG/ML IJ SOLN: 0.5000 mg | INTRAMUSCULAR | Status: DC | PRN

## 2018-01-01 MED ORDER — FINASTERIDE 5 MG PO TABS
5.0000 mg | ORAL_TABLET | Freq: Every day | ORAL | Status: DC
  Administered 2018-01-02: 5 mg via ORAL
  Filled 2018-01-01: qty 1

## 2018-01-01 MED ORDER — TRANEXAMIC ACID 1000 MG/10ML IV SOLN
1000.0000 mg | Freq: Once | INTRAVENOUS | Status: AC
  Administered 2018-01-01: 1000 mg via INTRAVENOUS
  Filled 2018-01-01: qty 1100

## 2018-01-01 MED ORDER — OXYCODONE HCL 5 MG PO TABS: 5.0000 mg | ORAL_TABLET | Freq: Once | ORAL | Status: DC | PRN

## 2018-01-01 MED ORDER — BUPIVACAINE LIPOSOME 1.3 % IJ SUSP
INTRAMUSCULAR | Status: DC | PRN
  Administered 2018-01-01: 20 mL

## 2018-01-01 SURGICAL SUPPLY — 52 items
BAG ZIPLOCK 12X15 (MISCELLANEOUS) ×3 IMPLANT
BANDAGE ACE 6X5 VEL STRL LF (GAUZE/BANDAGES/DRESSINGS) ×3 IMPLANT
BLADE SAG 18X100X1.27 (BLADE) ×3 IMPLANT
BLADE SAW SGTL 11.0X1.19X90.0M (BLADE) ×3 IMPLANT
BNDG CONFORM 6X.82 1P STRL (GAUZE/BANDAGES/DRESSINGS) ×3 IMPLANT
BOWL SMART MIX CTS (DISPOSABLE) ×3 IMPLANT
CAPT KNEE TOTAL 3 ATTUNE ×3 IMPLANT
CEMENT HV SMART SET (Cement) ×6 IMPLANT
CLOSURE WOUND 1/2 X4 (GAUZE/BANDAGES/DRESSINGS) ×2
COVER SURGICAL LIGHT HANDLE (MISCELLANEOUS) ×3 IMPLANT
CUFF TOURN SGL QUICK 34 (TOURNIQUET CUFF) ×2
CUFF TRNQT CYL 34X4X40X1 (TOURNIQUET CUFF) ×1 IMPLANT
DECANTER SPIKE VIAL GLASS SM (MISCELLANEOUS) ×3 IMPLANT
DRAPE U-SHAPE 47X51 STRL (DRAPES) ×3 IMPLANT
DRSG ADAPTIC 3X8 NADH LF (GAUZE/BANDAGES/DRESSINGS) ×3 IMPLANT
DRSG PAD ABDOMINAL 8X10 ST (GAUZE/BANDAGES/DRESSINGS) ×3 IMPLANT
DURAPREP 26ML APPLICATOR (WOUND CARE) ×3 IMPLANT
ELECT REM PT RETURN 15FT ADLT (MISCELLANEOUS) ×3 IMPLANT
EVACUATOR 1/8 PVC DRAIN (DRAIN) ×3 IMPLANT
GAUZE SPONGE 4X4 12PLY STRL (GAUZE/BANDAGES/DRESSINGS) ×3 IMPLANT
GLOVE BIO SURGEON STRL SZ7 (GLOVE) ×3 IMPLANT
GLOVE BIO SURGEON STRL SZ8 (GLOVE) ×6 IMPLANT
GLOVE BIOGEL PI IND STRL 6.5 (GLOVE) ×1 IMPLANT
GLOVE BIOGEL PI IND STRL 7.0 (GLOVE) ×2 IMPLANT
GLOVE BIOGEL PI IND STRL 8 (GLOVE) ×1 IMPLANT
GLOVE BIOGEL PI INDICATOR 6.5 (GLOVE) ×2
GLOVE BIOGEL PI INDICATOR 7.0 (GLOVE) ×4
GLOVE BIOGEL PI INDICATOR 8 (GLOVE) ×2
GLOVE SURG SS PI 6.5 STRL IVOR (GLOVE) ×3 IMPLANT
GOWN STRL REUS W/TWL LRG LVL3 (GOWN DISPOSABLE) ×6 IMPLANT
HANDPIECE INTERPULSE COAX TIP (DISPOSABLE) ×2
HOLDER FOLEY CATH W/STRAP (MISCELLANEOUS) IMPLANT
IMMOBILIZER KNEE 20 (SOFTGOODS) ×3
IMMOBILIZER KNEE 20 THIGH 36 (SOFTGOODS) ×1 IMPLANT
MANIFOLD NEPTUNE II (INSTRUMENTS) ×3 IMPLANT
NS IRRIG 1000ML POUR BTL (IV SOLUTION) ×3 IMPLANT
PACK TOTAL KNEE CUSTOM (KITS) ×3 IMPLANT
PAD ABD 8X10 STRL (GAUZE/BANDAGES/DRESSINGS) ×3 IMPLANT
PADDING CAST COTTON 6X4 STRL (CAST SUPPLIES) ×9 IMPLANT
POSITIONER SURGICAL ARM (MISCELLANEOUS) ×3 IMPLANT
SET HNDPC FAN SPRY TIP SCT (DISPOSABLE) ×1 IMPLANT
STRIP CLOSURE SKIN 1/2X4 (GAUZE/BANDAGES/DRESSINGS) ×4 IMPLANT
SUT MNCRL AB 4-0 PS2 18 (SUTURE) ×3 IMPLANT
SUT STRATAFIX 0 PDS 27 VIOLET (SUTURE) ×3
SUT VIC AB 2-0 CT1 27 (SUTURE) ×6
SUT VIC AB 2-0 CT1 TAPERPNT 27 (SUTURE) ×3 IMPLANT
SUTURE STRATFX 0 PDS 27 VIOLET (SUTURE) ×1 IMPLANT
TAPE STRIPS DRAPE STRL (GAUZE/BANDAGES/DRESSINGS) ×3 IMPLANT
TRAY FOLEY MTR SLVR 16FR STAT (SET/KITS/TRAYS/PACK) ×3 IMPLANT
WATER STERILE IRR 1000ML POUR (IV SOLUTION) ×6 IMPLANT
WRAP KNEE MAXI GEL POST OP (GAUZE/BANDAGES/DRESSINGS) ×3 IMPLANT
YANKAUER SUCT BULB TIP 10FT TU (MISCELLANEOUS) ×3 IMPLANT

## 2018-01-01 NOTE — Progress Notes (Signed)
Assisted Dr. Miller with left, ultrasound guided, adductor canal block. Side rails up, monitors on throughout procedure. See vital signs in flow sheet. Tolerated Procedure well.  

## 2018-01-01 NOTE — Anesthesia Procedure Notes (Signed)
Date/Time: 01/01/2018 10:22 AM Performed by: Talbot Grumbling, CRNA Oxygen Delivery Method: Simple face mask

## 2018-01-01 NOTE — Anesthesia Procedure Notes (Signed)
Anesthesia Regional Block: Adductor canal block   Pre-Anesthetic Checklist: ,, timeout performed, Correct Patient, Correct Site, Correct Laterality, Correct Procedure, Correct Position, site marked, Risks and benefits discussed,  Surgical consent,  Pre-op evaluation,  At surgeon's request and post-op pain management  Laterality: Left  Prep: chloraprep       Needles:  Injection technique: Single-shot  Needle Type: Stimiplex     Needle Length: 9cm  Needle Gauge: 21     Additional Needles:   Procedures:,,,, ultrasound used (permanent image in chart),,,,  Narrative:  Start time: 01/01/2018 9:27 AM End time: 01/01/2018 9:32 AM Injection made incrementally with aspirations every 5 mL.  Performed by: Personally  Anesthesiologist: Lynda Rainwater, MD

## 2018-01-01 NOTE — Discharge Instructions (Signed)
° °Dr. Frank Aluisio °Total Joint Specialist °[facility] °[address] °[place], [place] [address] °[phone] ° °TOTAL KNEE REPLACEMENT POSTOPERATIVE DIRECTIONS ° °Knee Rehabilitation, Guidelines Following Surgery  °Results after knee surgery are often greatly improved when you follow the exercise, range of motion and muscle strengthening exercises prescribed by your doctor. Safety measures are also important to protect the knee from further injury. Any time any of these exercises cause you to have increased pain or swelling in your knee joint, decrease the amount until you are comfortable again and slowly increase them. If you have problems or questions, call your caregiver or physical therapist for advice.  ° °HOME CARE INSTRUCTIONS  °• Remove items at home which could result in a fall. This includes throw rugs or furniture in walking pathways.  °· ICE to the affected knee every three hours for 30 minutes at a time and then as needed for pain and swelling.  Continue to use ice on the knee for pain and swelling from surgery. You may notice swelling that will progress down to the foot and ankle.  This is normal after surgery.  Elevate the leg when you are not up walking on it.   °· Continue to use the breathing machine which will help keep your temperature down.  It is common for your temperature to cycle up and down following surgery, especially at night when you are not up moving around and exerting yourself.  The breathing machine keeps your lungs expanded and your temperature down. °· Do not place pillow under knee, focus on keeping the knee straight while resting ° °DIET °You may resume your previous home diet once your are discharged from the hospital. ° °DRESSING / WOUND CARE / SHOWERING °You may shower 3 days after surgery, but keep the wounds dry during showering.  You may use an occlusive plastic wrap (Press'n Seal for example), NO SOAKING/SUBMERGING IN THE BATHTUB.  If the bandage  gets wet, change with a clean dry gauze.  If the incision gets wet, pat the wound dry with a clean towel. °You may start showering once you are discharged home but do not submerge the incision under water. Just pat the incision dry and apply a dry gauze dressing on daily. °Change the surgical dressing daily and reapply a dry dressing each time. ° °ACTIVITY °Walk with your walker as instructed. °Use walker as long as suggested by your caregivers. °Avoid periods of inactivity such as sitting longer than an hour when not asleep. This helps prevent blood clots.  °You may resume a sexual relationship in one month or when given the OK by your doctor.  °You may return to work once you are cleared by your doctor.  °Do not drive a car for 6 weeks or until released by you surgeon.  °Do not drive while taking narcotics. ° °WEIGHT BEARING °Weight bearing as tolerated with assist device (walker, cane, etc) as directed, use it as long as suggested by your surgeon or therapist, typically at least 4-6 weeks. ° °POSTOPERATIVE CONSTIPATION PROTOCOL °Constipation - defined medically as fewer than three stools per week and severe constipation as less than one stool per week. ° °One of the most common issues patients have following surgery is constipation.  Even if you have a regular bowel pattern at home, your normal regimen is likely to be disrupted due to multiple reasons following surgery.  Combination of anesthesia, postoperative narcotics, change in appetite and fluid intake all can affect your bowels.    In order to avoid complications following surgery, here are some recommendations in order to help you during your recovery period. ° °Colace (docusate) - Pick up an over-the-counter form of Colace or another stool softener and take twice a day as long as you are requiring postoperative pain medications.  Take with a full glass of water daily.  If you experience loose stools or diarrhea, hold the colace until you stool forms back  up.  If your symptoms do not get better within 1 week or if they get worse, check with your doctor. ° °Dulcolax (bisacodyl) - Pick up over-the-counter and take as directed by the product packaging as needed to assist with the movement of your bowels.  Take with a full glass of water.  Use this product as needed if not relieved by Colace only.  ° °MiraLax (polyethylene glycol) - Pick up over-the-counter to have on hand.  MiraLax is a solution that will increase the amount of water in your bowels to assist with bowel movements.  Take as directed and can mix with a glass of water, juice, soda, coffee, or tea.  Take if you go more than two days without a movement. °Do not use MiraLax more than once per day. Call your doctor if you are still constipated or irregular after using this medication for 7 days in a row. ° °If you continue to have problems with postoperative constipation, please contact the office for further assistance and recommendations.  If you experience "the worst abdominal pain ever" or develop nausea or vomiting, please contact the office immediatly for further recommendations for treatment. ° °ITCHING ° If you experience itching with your medications, try taking only a single pain pill, or even half a pain pill at a time.  You can also use Benadryl over the counter for itching or also to help with sleep.  ° °TED HOSE STOCKINGS °Wear the elastic stockings on both legs for three weeks following surgery during the day but you may remove then at night for sleeping. ° °MEDICATIONS °See your medication summary on the “After Visit Summary” that the nursing staff will review with you prior to discharge.  You may have some home medications which will be placed on hold until you complete the course of blood thinner medication.  It is important for you to complete the blood thinner medication as prescribed by your surgeon.  Continue your approved medications as instructed at time of discharge. ° °PRECAUTIONS °If  you experience chest pain or shortness of breath - call 911 immediately for transfer to the hospital emergency department.  °If you develop a fever greater that 101 F, purulent drainage from wound, increased redness or drainage from wound, foul odor from the wound/dressing, or calf pain - CONTACT YOUR SURGEON.   °                                                °FOLLOW-UP APPOINTMENTS °Make sure you keep all of your appointments after your operation with your surgeon and caregivers. You should call the office at the above phone number and make an appointment for approximately two weeks after the date of your surgery or on the date instructed by your surgeon outlined in the "After Visit Summary". ° ° °RANGE OF MOTION AND STRENGTHENING EXERCISES  °Rehabilitation of the knee is important following a knee injury or   an operation. After just a few days of immobilization, the muscles of the thigh which control the knee become weakened and shrink (atrophy). Knee exercises are designed to build up the tone and strength of the thigh muscles and to improve knee motion. Often times heat used for twenty to thirty minutes before working out will loosen up your tissues and help with improving the range of motion but do not use heat for the first two weeks following surgery. These exercises can be done on a training (exercise) mat, on the floor, on a table or on a bed. Use what ever works the best and is most comfortable for you Knee exercises include:  °• Leg Lifts - While your knee is still immobilized in a splint or cast, you can do straight leg raises. Lift the leg to 60 degrees, hold for 3 sec, and slowly lower the leg. Repeat 10-20 times 2-3 times daily. Perform this exercise against resistance later as your knee gets better.  °• Quad and Hamstring Sets - Tighten up the muscle on the front of the thigh (Quad) and hold for 5-10 sec. Repeat this 10-20 times hourly. Hamstring sets are done by pushing the foot backward against an  object and holding for 5-10 sec. Repeat as with quad sets.  °· Leg Slides: Lying on your back, slowly slide your foot toward your buttocks, bending your knee up off the floor (only go as far as is comfortable). Then slowly slide your foot back down until your leg is flat on the floor again. °· Angel Wings: Lying on your back spread your legs to the side as far apart as you can without causing discomfort.  °A rehabilitation program following serious knee injuries can speed recovery and prevent re-injury in the future due to weakened muscles. Contact your doctor or a physical therapist for more information on knee rehabilitation.  ° °IF YOU ARE TRANSFERRED TO A SKILLED REHAB FACILITY °If the patient is transferred to a skilled rehab facility following release from the hospital, a list of the current medications will be sent to the facility for the patient to continue.  When discharged from the skilled rehab facility, please have the facility set up the patient's Home Health Physical Therapy prior to being released. Also, the skilled facility will be responsible for providing the patient with their medications at time of release from the facility to include their pain medication, the muscle relaxants, and their blood thinner medication. If the patient is still at the rehab facility at time of the two week follow up appointment, the skilled rehab facility will also need to assist the patient in arranging follow up appointment in our office and any transportation needs. ° °MAKE SURE YOU:  °• Understand these instructions.  °• Get help right away if you are not doing well or get worse.  ° ° °Pick up stool softner and laxative for home use following surgery while on pain medications. °Do not submerge incision under water. °Please use good hand washing techniques while changing dressing each day. °May shower starting three days after surgery. °Please use a clean towel to pat the incision dry following showers. °Continue to  use ice for pain and swelling after surgery. °Do not use any lotions or creams on the incision until instructed by your surgeon. ° °

## 2018-01-01 NOTE — Transfer of Care (Signed)
Immediate Anesthesia Transfer of Care Note  Patient: Joshua Moran  Procedure(s) Performed: LEFT TOTAL KNEE ARTHROPLASTY (Left Knee)  Patient Location: PACU  Anesthesia Type:Spinal  Level of Consciousness: awake, alert  and oriented  Airway & Oxygen Therapy: Patient Spontanous Breathing and Patient connected to face mask oxygen  Post-op Assessment: Report given to RN and Post -op Vital signs reviewed and stable  Post vital signs: Reviewed and stable  Last Vitals:  Vitals Value Taken Time  BP    Temp    Pulse 60 01/01/2018 11:54 AM  Resp 13 01/01/2018 11:54 AM  SpO2 99 % 01/01/2018 11:54 AM  Vitals shown include unvalidated device data.  Last Pain:  Vitals:   01/01/18 0832  TempSrc:   PainSc: 0-No pain         Complications: No apparent anesthesia complications

## 2018-01-01 NOTE — Anesthesia Postprocedure Evaluation (Signed)
Anesthesia Post Note  Patient: Joshua Moran  Procedure(s) Performed: LEFT TOTAL KNEE ARTHROPLASTY (Left Knee)     Patient location during evaluation: PACU Anesthesia Type: Spinal Level of consciousness: oriented and awake and alert Pain management: pain level controlled Vital Signs Assessment: post-procedure vital signs reviewed and stable Respiratory status: spontaneous breathing and respiratory function stable Cardiovascular status: blood pressure returned to baseline and stable Postop Assessment: no headache, no backache and no apparent nausea or vomiting Anesthetic complications: no    Last Vitals:  Vitals:   01/01/18 1300 01/01/18 1326  BP: (!) 151/82 137/75  Pulse: (!) 58 61  Resp: 18 16  Temp: 37.1 C (!) 36.4 C  SpO2: 100% 95%    Last Pain:  Vitals:   01/01/18 1326  TempSrc: Oral  PainSc:                  Lynda Rainwater

## 2018-01-01 NOTE — Op Note (Addendum)
OPERATIVE REPORT-TOTAL KNEE ARTHROPLASTY   Pre-operative diagnosis- Osteoarthritis  Bilateral knee(s)  Post-operative diagnosis- Osteoarthritis Bilateral knee(s)  Procedure-  Left  Total Knee Arthroplasty (Depuy Attune)   Right knee cortisone injection  Surgeon- Dione Plover. Clever Geraldo, MD  Assistant- Ardeen Jourdain, PA-C   Anesthesia-  Adductor canal block and spinal  EBL-25 ml   Drains Hemovac  Tourniquet time-  Total Tourniquet Time Documented: Thigh (Left) - 42 minutes Total: Thigh (Left) - 42 minutes     Complications- None  Condition-PACU - hemodynamically stable.   Brief Clinical Note   Joshua Moran is a 72 y.o. year old male with end stage OA of his left knee with progressively worsening pain and dysfunction. He has constant pain, with activity and at rest and significant functional deficits with difficulties even with ADLs. He has had extensive non-op management including analgesics, injections of cortisone and viscosupplements, and home exercise program, but remains in significant pain with significant dysfunction. Radiographs show bone on bone arthritis all 3 compartments with tibial subluxation. He presents now for left Total Knee Arthroplasty.  He also has advanced osteoarthritis of his right knee and requests cortisone injection.  Procedure in detail---   The patient is brought into the operating room and positioned supine on the operating table. After successful administration of  Adductor canal block and spinal,   a tourniquet is placed high on the  Left thigh(s) and the lower extremity is prepped and draped in the usual sterile fashion. Time out is performed by the operating team and then the  Left lower extremity is wrapped in Esmarch, knee flexed and the tourniquet inflated to 300 mmHg.       A midline incision is made with a ten blade through the subcutaneous tissue to the level of the extensor mechanism. A fresh blade is used to make a medial parapatellar  arthrotomy. Soft tissue over the proximal medial tibia is subperiosteally elevated to the joint line with a knife and into the semimembranosus bursa with a Cobb elevator. Soft tissue over the proximal lateral tibia is elevated with attention being paid to avoiding the patellar tendon on the tibial tubercle. The patella is everted, knee flexed 90 degrees and the ACL and PCL are removed. Findings are bone on bone all 3 compartments with massive global ostephytes.        The drill is used to create a starting hole in the distal femur and the canal is thoroughly irrigated with sterile saline to remove the fatty contents. The 5 degree Left  valgus alignment guide is placed into the femoral canal and the distal femoral cutting block is pinned to remove 9 mm off the distal femur. Resection is made with an oscillating saw.      The tibia is subluxed forward and the menisci are removed. The extramedullary alignment guide is placed referencing proximally at the medial aspect of the tibial tubercle and distally along the second metatarsal axis and tibial crest. The block is pinned to remove 43mm off the more deficient medial  side. Resection is made with an oscillating saw. Size 8is the most appropriate size for the tibia and the proximal tibia is prepared with the modular drill and keel punch for that size.      The femoral sizing guide is placed and size 8 is most appropriate. Rotation is marked off the epicondylar axis and confirmed by creating a rectangular flexion gap at 90 degrees. The size 8 cutting block is pinned in this rotation and  the anterior, posterior and chamfer cuts are made with the oscillating saw. The intercondylar block is then placed and that cut is made.      Trial size 8 tibial component, trial size 8 posterior stabilized femur and a 10  mm posterior stabilized rotating platform insert trial is placed. Full extension is achieved with excellent varus/valgus and anterior/posterior balance throughout  full range of motion. The patella is everted and thickness measured to be 27  mm. Free hand resection is taken to 15 mm, a 41 template is placed, lug holes are drilled, trial patella is placed, and it tracks normally. Osteophytes are removed off the posterior femur with the trial in place. All trials are removed and the cut bone surfaces prepared with pulsatile lavage. Cement is mixed and once ready for implantation, the size 8 tibial implant, size  8 posterior stabilized femoral component, and the size 41 patella are cemented in place and the patella is held with the clamp. The trial insert is placed and the knee held in full extension. The Exparel (20 ml mixed with 60 ml saline) is injected into the extensor mechanism, posterior capsule, medial and lateral gutters and subcutaneous tissues.  All extruded cement is removed and once the cement is hard the permanent 10 mm posterior stabilized rotating platform insert is placed into the tibial tray.      The wound is copiously irrigated with saline solution and the extensor mechanism closed over a hemovac drain with #1 V-loc suture. The tourniquet is released for a total tourniquet time of 42  minutes. Flexion against gravity is 130 degrees and the patella tracks normally. Subcutaneous tissue is closed with 2.0 vicryl and subcuticular with running 4.0 Monocryl. The incision is cleaned and dried and steri-strips and a bulky sterile dressing are applied. The limb is placed into a knee immobilizer.      I then prepped his right knee with chloraprep and injected with 80 mg (2 ml) Depomedrol with no problems. The patient is then awakened and transported to recovery in stable condition.      Please note that a surgical assistant was a medical necessity for this procedure in order to perform it in a safe and expeditious manner. Surgical assistant was necessary to retract the ligaments and vital neurovascular structures to prevent injury to them and also necessary for  proper positioning of the limb to allow for anatomic placement of the prosthesis.   Dione Plover Terrika Zuver, MD    01/01/2018, 11:28 AM

## 2018-01-01 NOTE — Interval H&P Note (Signed)
History and Physical Interval Note:  01/01/2018 8:25 AM  Joshua Moran  has presented today for surgery, with the diagnosis of Left knee osteoarthritis  The various methods of treatment have been discussed with the patient and family. After consideration of risks, benefits and other options for treatment, the patient has consented to  Procedure(s) with comments: LEFT TOTAL KNEE ARTHROPLASTY (Left) - 50 mins as a surgical intervention .  The patient's history has been reviewed, patient examined, no change in status, stable for surgery.  I have reviewed the patient's chart and labs.  Questions were answered to the patient's satisfaction.     Pilar Plate Cecilie Heidel

## 2018-01-01 NOTE — Evaluation (Signed)
Physical Therapy Evaluation Patient Details Name: Joshua Moran MRN: [id number] DOB: [birth year] Today's Date: 01/01/2018   History of Present Illness  s/p L TKA; PMHx: obesity  Clinical Impression  Pt admitted with above diagnosis. Pt currently with functional limitations due to the deficits listed below (see PT Problem List). Pt amb 40' with RW and min/guard assist today, should continue to progress well, will follow in acute setting.  Pt will benefit from skilled PT to increase their independence and safety with mobility to allow discharge to the venue listed below.       Follow Up Recommendations Follow surgeon's recommendation for DC plan and follow-up therapies    Equipment Recommendations  None recommended by PT    Recommendations for Other Services       Precautions / Restrictions Precautions Precautions: Fall;Knee Required Braces or Orthoses: Knee Immobilizer - Left Knee Immobilizer - Left: Discontinue once straight leg raise with < 10 degree lag Restrictions Weight Bearing Restrictions: No Other Position/Activity Restrictions: WBAT      Mobility  Bed Mobility Overal bed mobility: Needs Assistance Bed Mobility: Supine to Sit     Supine to sit: Min guard     General bed mobility comments: incr time, min/guard for LLE  Transfers Overall transfer level: Needs assistance Equipment used: Rolling walker (2 wheeled) Transfers: Sit to/from Stand Sit to Stand: Min assist;+2 safety/equipment         General transfer comment: cues for hand placement and LLE position  Ambulation/Gait Ambulation/Gait assistance: Min guard Gait Distance (Feet): 40 Feet Assistive device: Rolling walker (2 wheeled) Gait Pattern/deviations: Step-to pattern;Wide base of support;Decreased weight shift to left     General Gait Details: cues for sequence initially  Stairs            Wheelchair Mobility    Modified Rankin (Stroke Patients Only)       Balance                                              Pertinent Vitals/Pain Pain Assessment: 0-10 Pain Location: left knee Pain Descriptors / Indicators: Sore Pain Intervention(s): Premedicated before session;Monitored during session;Limited activity within patient's tolerance;Repositioned;Ice applied    Home Living Family/patient expects to be discharged to:: Private residence Living Arrangements: Spouse/significant other   Type of Home: House Home Access: Stairs to enter Entrance Stairs-Rails: None Entrance Stairs-Number of Steps: 2 and 1 Home Layout: One level Home Equipment: Environmental consultant - 2 wheels;Bedside commode;Shower seat      Prior Function Level of Independence: Independent               Hand Dominance        Extremity/Trunk Assessment   Upper Extremity Assessment Upper Extremity Assessment: Overall WFL for tasks assessed    Lower Extremity Assessment Lower Extremity Assessment: LLE deficits/detail LLE Deficits / Details: hip flexion and knee extension at least 3/5, knee AAROM grossly -10* to 50* flexion; ankle WFL       Communication   Communication: No difficulties  Cognition Arousal/Alertness: Awake/alert Behavior During Therapy: WFL for tasks assessed/performed Overall Cognitive Status: Within Functional Limits for tasks assessed                                        General Comments  Exercises Total Joint Exercises Ankle Circles/Pumps: AROM;Both;10 reps Quad Sets: Both;AROM;10 reps Straight Leg Raises: AROM;AAROM;5 reps;Left   Assessment/Plan    PT Assessment Patient needs continued PT services  PT Problem List Decreased strength;Decreased activity tolerance;Decreased mobility;Decreased range of motion;Decreased knowledge of use of DME;Obesity       PT Treatment Interventions Gait training;DME instruction;Therapeutic activities;Functional mobility training;Stair training;Therapeutic exercise;Patient/family education     PT Goals (Current goals can be found in the Care Plan section)  Acute Rehab PT Goals Patient Stated Goal: less knee pain PT Goal Formulation: With patient Time For Goal Achievement: 01/08/18 Potential to Achieve Goals: Good    Frequency 7X/week   Barriers to discharge        Co-evaluation               AM-PAC PT "6 Clicks" Daily Activity  Outcome Measure Difficulty turning over in bed (including adjusting bedclothes, sheets and blankets)?: A Lot Difficulty moving from lying on back to sitting on the side of the bed? : A Lot Difficulty sitting down on and standing up from a chair with arms (e.g., wheelchair, bedside commode, etc,.)?: Unable Help needed moving to and from a bed to chair (including a wheelchair)?: A Little Help needed walking in hospital room?: A Little Help needed climbing 3-5 steps with a railing? : A Lot 6 Click Score: 13    End of Session Equipment Utilized During Treatment: Gait belt;Left knee immobilizer Activity Tolerance: Patient tolerated treatment well Patient left: with call bell/phone within reach;in chair;with chair alarm set;with family/visitor present   PT Visit Diagnosis: Difficulty in walking, not elsewhere classified (R26.2)    Time: 8811-0315 PT Time Calculation (min) (ACUTE ONLY): 13 min   Charges:   PT Evaluation $PT Eval Low Complexity: 1 Low          Kenyon Ana, PT Pager: [phone] 01/01/2018   [facility] 01/01/2018, 5:20 PM

## 2018-01-01 NOTE — Anesthesia Preprocedure Evaluation (Signed)
Anesthesia Evaluation  Patient identified by MRN, date of birth, ID band Patient awake    Reviewed: Allergy & Precautions, NPO status , Patient's Chart, lab work & pertinent test results  Airway Mallampati: II  TM Distance: >3 FB Neck ROM: Full    Dental no notable dental hx.    Pulmonary neg pulmonary ROS,    Pulmonary exam normal breath sounds clear to auscultation       Cardiovascular negative cardio ROS Normal cardiovascular exam Rhythm:Regular Rate:Normal     Neuro/Psych negative neurological ROS  negative psych ROS   GI/Hepatic negative GI ROS, Neg liver ROS, GERD  ,  Endo/Other  negative endocrine ROS  Renal/GU negative Renal ROS  negative genitourinary   Musculoskeletal negative musculoskeletal ROS (+) Arthritis , Osteoarthritis,    Abdominal (+) + obese,   Peds negative pediatric ROS (+)  Hematology negative hematology ROS (+)   Anesthesia Other Findings   Reproductive/Obstetrics negative OB ROS                             Anesthesia Physical Anesthesia Plan  ASA: II  Anesthesia Plan: Spinal and Regional   Post-op Pain Management:  Regional for Post-op pain   Induction: Intravenous  PONV Risk Score and Plan: 1 and Ondansetron  Airway Management Planned: Simple Face Mask  Additional Equipment:   Intra-op Plan:   Post-operative Plan:   Informed Consent: I have reviewed the patients History and Physical, chart, labs and discussed the procedure including the risks, benefits and alternatives for the proposed anesthesia with the patient or authorized representative who has indicated his/her understanding and acceptance.   Dental advisory given  Plan Discussed with: CRNA  Anesthesia Plan Comments:         Anesthesia Quick Evaluation

## 2018-01-01 NOTE — Anesthesia Procedure Notes (Signed)
Spinal  Patient location during procedure: OR Start time: 01/01/2018 10:16 AM End time: 01/01/2018 10:21 AM Staffing Anesthesiologist: Lynda Rainwater, MD Performed: anesthesiologist  Preanesthetic Checklist Completed: patient identified, site marked, surgical consent, pre-op evaluation, timeout performed, IV checked, risks and benefits discussed and monitors and equipment checked Spinal Block Patient position: sitting Prep: ChloraPrep Patient monitoring: heart rate, cardiac monitor, continuous pulse ox and blood pressure Approach: midline Location: L3-4 Injection technique: single-shot Needle Needle type: Quincke  Needle gauge: 22 G Needle length: 9 cm

## 2018-01-02 ENCOUNTER — Encounter (HOSPITAL_COMMUNITY): Payer: Self-pay | Admitting: Orthopedic Surgery

## 2018-01-02 LAB — CBC
HCT: 36.3 % — ABNORMAL LOW (ref 39.0–52.0)
Hemoglobin: 11.9 g/dL — ABNORMAL LOW (ref 13.0–17.0)
MCH: 30.1 pg (ref 26.0–34.0)
MCHC: 32.8 g/dL (ref 30.0–36.0)
MCV: 91.7 fL (ref 78.0–100.0)
PLATELETS: 208 10*3/uL (ref 150–400)
RBC: 3.96 MIL/uL — ABNORMAL LOW (ref 4.22–5.81)
RDW: 14.4 % (ref 11.5–15.5)
WBC: 11.5 10*3/uL — AB (ref 4.0–10.5)

## 2018-01-02 LAB — BASIC METABOLIC PANEL
ANION GAP: 4 — AB (ref 5–15)
BUN: 12 mg/dL (ref 8–23)
CALCIUM: 8.3 mg/dL — AB (ref 8.9–10.3)
CO2: 26 mmol/L (ref 22–32)
Chloride: 110 mmol/L (ref 98–111)
Creatinine, Ser: 0.7 mg/dL (ref 0.61–1.24)
Glucose, Bld: 134 mg/dL — ABNORMAL HIGH (ref 70–99)
Potassium: 4 mmol/L (ref 3.5–5.1)
SODIUM: 140 mmol/L (ref 135–145)

## 2018-01-02 MED ORDER — TRAMADOL HCL 50 MG PO TABS: 50.0000 mg | ORAL_TABLET | Freq: Four times a day (QID) | ORAL | 0 refills | Status: DC | PRN

## 2018-01-02 MED ORDER — TRAMADOL HCL 50 MG PO TABS: 50.0000 mg | ORAL_TABLET | Freq: Four times a day (QID) | ORAL | Status: DC | PRN

## 2018-01-02 MED ORDER — ASPIRIN 325 MG PO TBEC: 325.0000 mg | DELAYED_RELEASE_TABLET | Freq: Two times a day (BID) | ORAL | 0 refills | Status: AC

## 2018-01-02 MED ORDER — OXYCODONE HCL 5 MG PO TABS: 5.0000 mg | ORAL_TABLET | Freq: Four times a day (QID) | ORAL | 0 refills | Status: DC | PRN

## 2018-01-02 MED ORDER — METHOCARBAMOL 500 MG PO TABS: 500.0000 mg | ORAL_TABLET | Freq: Four times a day (QID) | ORAL | 0 refills | Status: DC | PRN

## 2018-01-02 MED ORDER — GABAPENTIN 300 MG PO CAPS: 300.0000 mg | ORAL_CAPSULE | Freq: Three times a day (TID) | ORAL | 0 refills | Status: DC

## 2018-01-02 NOTE — Progress Notes (Signed)
Physical Therapy Treatment Patient Details Name: Joshua Moran MRN: [id number] DOB: [birth year] Today's Date: 01/02/2018    History of Present Illness s/p L TKA; PMHx: obesity    PT Comments    POD # 1 pm session With 2 family members present, assisted with amb and stairs.  Pt given HEP handout.  Instructed on freq as well as use of ICE.  All mobility questions addressed.  Pt ready for D/C to home.   Follow Up Recommendations  Follow surgeon's recommendation for DC plan and follow-up therapies     Equipment Recommendations  None recommended by PT    Recommendations for Other Services       Precautions / Restrictions Precautions Precautions: Fall;Knee Precaution Comments: instructed on KI use  "other" knee is bad Required Braces or Orthoses: Knee Immobilizer - Left Knee Immobilizer - Left: Discontinue once straight leg raise with < 10 degree lag Restrictions Weight Bearing Restrictions: No Other Position/Activity Restrictions: WBAT    Mobility  Bed Mobility Overal bed mobility: Needs Assistance Bed Mobility: Supine to Sit     Supine to sit: Min guard     General bed mobility comments: OOB in recliner   Transfers Overall transfer level: Needs assistance Equipment used: Rolling walker (2 wheeled) Transfers: Sit to/from Stand Sit to Stand: Supervision;Min guard         General transfer comment: 25% VC's on proper hand placement  Ambulation/Gait Ambulation/Gait assistance: Min guard;Supervision Gait Distance (Feet): 25 Feet Assistive device: Rolling walker (2 wheeled) Gait Pattern/deviations: Step-to pattern;Wide base of support;Decreased weight shift to left Gait velocity: decreased   General Gait Details: amb with family "hands on" with instruction on KI use and safety with turns   Stairs Stairs: Yes Stairs assistance: Min assist Stair Management: No rails;Step to pattern;Backwards;With walker Number of Stairs: 3 General stair comments: with 2 family  members "hands on" training on proper tech up backward due to no rails and instructed to wear KI for increased support.     Wheelchair Mobility    Modified Rankin (Stroke Patients Only)       Balance                                            Cognition Arousal/Alertness: Awake/alert Behavior During Therapy: WFL for tasks assessed/performed Overall Cognitive Status: Within Functional Limits for tasks assessed                                        Exercises      General Comments        Pertinent Vitals/Pain Pain Assessment: 0-10 Pain Score: 4  Pain Location: left knee Pain Descriptors / Indicators: Sore Pain Intervention(s): Monitored during session;Repositioned;Ice applied    Home Living                      Prior Function            PT Goals (current goals can now be found in the care plan section) Progress towards PT goals: Progressing toward goals    Frequency    7X/week      PT Plan Current plan remains appropriate    Co-evaluation              AM-PAC PT "6 Clicks"  Daily Activity  Outcome Measure  Difficulty turning over in bed (including adjusting bedclothes, sheets and blankets)?: A Lot Difficulty moving from lying on back to sitting on the side of the bed? : A Lot Difficulty sitting down on and standing up from a chair with arms (e.g., wheelchair, bedside commode, etc,.)?: A Lot Help needed moving to and from a bed to chair (including a wheelchair)?: A Lot Help needed walking in hospital room?: A Lot Help needed climbing 3-5 steps with a railing? : A Lot 6 Click Score: 12    End of Session Equipment Utilized During Treatment: Gait belt;Left knee immobilizer Activity Tolerance: Patient tolerated treatment well Patient left: with call bell/phone within reach;in chair;with chair alarm set;with family/visitor present   PT Visit Diagnosis: Difficulty in walking, not elsewhere classified  (R26.2)     Time: 8592-9244 PT Time Calculation (min) (ACUTE ONLY): 19 min  Charges:  $Gait Training: 8-22 min                     Rica Koyanagi  PTA [facility]  Acute  Rehab Pager      [phone]

## 2018-01-02 NOTE — Progress Notes (Signed)
Spoke with patient and spouse at bedside. Confirmed plan for OP PT, already arranged. Has RW and 3n1. [phone] 

## 2018-01-02 NOTE — Progress Notes (Signed)
RN reviewed discharge instructions with patient and family. All questions answered.   Paperwork and prescriptions given to patient.   NT rolled patient down with all belongings to family car.  

## 2018-01-02 NOTE — Discharge Summary (Signed)
Physician Discharge Summary   Patient ID: Joshua Moran MRN: [id number] DOB/AGE: 73/06/1945 73 y.o.  Admit date: 01/01/2018 Discharge date: 01/02/2018  Primary Diagnosis: Osteoarthritis left knee   Admission Diagnoses:  Past Medical History:  Diagnosis Date  . Abnormal screening CT of chest    coronary calcium in left main and RCA  . Anemia   . Arthritis   . Complication of anesthesia    Pt reports difficult urinating  . GERD (gastroesophageal reflux disease)   . Glaucoma   . Hyperlipidemia    statin intolerant   Discharge Diagnoses:   Principal Problem:   OA (osteoarthritis) of knee  Estimated body mass index is 36.62 kg/m as calculated from the following:   Height as of this encounter: 6' (1.829 m).   Weight as of this encounter: 122.5 kg (270 lb).  Procedure:  Procedure(s) (LRB): LEFT TOTAL KNEE ARTHROPLASTY (Left)   Consults: None  HPI: Joshua Moran is a 72 y.o. year old male with end stage OA of his left knee with progressively worsening pain and dysfunction. He has constant pain, with activity and at rest and significant functional deficits with difficulties even with ADLs. He has had extensive non-op management including analgesics, injections of cortisone and viscosupplements, and home exercise program, but remains in significant pain with significant dysfunction. Radiographs show bone on bone arthritis all 3 compartments with tibial subluxation. He presents now for left Total Knee Arthroplasty.  He also has advanced osteoarthritis of his right knee and requests cortisone injection.  Laboratory Data: Admission on 01/01/2018, Discharged on 01/02/2018  Component Date Value Ref Range Status  . WBC 01/02/2018 11.5* 4.0 - 10.5 K/uL Final  . RBC 01/02/2018 3.96* 4.22 - 5.81 MIL/uL Final  . Hemoglobin 01/02/2018 11.9* 13.0 - 17.0 g/dL Final  . HCT 01/02/2018 36.3* 39.0 - 52.0 % Final  . MCV 01/02/2018 91.7  78.0 - 100.0 fL Final  . MCH 01/02/2018 30.1  26.0 - 34.0 pg  Final  . MCHC 01/02/2018 32.8  30.0 - 36.0 g/dL Final  . RDW 01/02/2018 14.4  11.5 - 15.5 % Final  . Platelets 01/02/2018 208  150 - 400 K/uL Final   Performed at [facility], [place] [address] [place], [place] [address]  . Sodium 01/02/2018 140  135 - 145 mmol/L Final  . Potassium 01/02/2018 4.0  3.5 - 5.1 mmol/L Final  . Chloride 01/02/2018 110  98 - 111 mmol/L Final  . CO2 01/02/2018 26  22 - 32 mmol/L Final  . Glucose, Bld 01/02/2018 134* 70 - 99 mg/dL Final  . BUN 01/02/2018 12  8 - 23 mg/dL Final  . Creatinine, Ser 01/02/2018 0.70  0.61 - 1.24 mg/dL Final  . Calcium 01/02/2018 8.3* 8.9 - 10.3 mg/dL Final  . GFR calc non Af Amer 01/02/2018 >60  >60 mL/min Final  . GFR calc Af Amer 01/02/2018 >60  >60 mL/min Final   Comment: (NOTE) The eGFR has been calculated using the CKD EPI equation. This calculation has not been validated in all clinical situations. eGFR's persistently <60 mL/min signify possible Chronic Kidney Disease.   Georgiann Hahn gap 01/02/2018 4* 5 - 15 Final   Performed at [facility], [place] [address] [place], [place] [address]  Hospital Outpatient Visit on 12/25/2017  Component Date Value Ref Range Status  . aPTT 12/25/2017 32  24 - 36 seconds Final   Performed at [facility], [place] [address], [place], [place] [address]  .  WBC 12/25/2017 5.2  4.0 - 10.5 K/uL Final  . RBC 12/25/2017 4.47  4.22 - 5.81 MIL/uL Final  . Hemoglobin 12/25/2017 13.4  13.0 - 17.0 g/dL Final  . HCT 12/25/2017 41.4  39.0 - 52.0 % Final  . MCV 12/25/2017 92.6  78.0 - 100.0 fL Final  . MCH 12/25/2017 30.0  26.0 - 34.0 pg Final  . MCHC 12/25/2017 32.4  30.0 - 36.0 g/dL Final  . RDW 12/25/2017 14.6  11.5 - 15.5 % Final  . Platelets 12/25/2017 227  150 - 400 K/uL Final   Performed at [facility], [place] [address], [place], [place] [address]  . Sodium 12/25/2017 141  135 - 145 mmol/L Final  . Potassium 12/25/2017 4.4  3.5 -  5.1 mmol/L Final  . Chloride 12/25/2017 109  98 - 111 mmol/L Final   Please note change in reference range.  . CO2 12/25/2017 27  22 - 32 mmol/L Final  . Glucose, Bld 12/25/2017 96  70 - 99 mg/dL Final   Please note change in reference range.  . BUN 12/25/2017 22  8 - 23 mg/dL Final   Please note change in reference range.  . Creatinine, Ser 12/25/2017 0.79  0.61 - 1.24 mg/dL Final  . Calcium 12/25/2017 9.0  8.9 - 10.3 mg/dL Final  . Total Protein 12/25/2017 6.8  6.5 - 8.1 g/dL Final  . Albumin 12/25/2017 3.5  3.5 - 5.0 g/dL Final  . AST 12/25/2017 21  15 - 41 U/L Final  . ALT 12/25/2017 17  0 - 44 U/L Final   Please note change in reference range.  . Alkaline Phosphatase 12/25/2017 67  38 - 126 U/L Final  . Total Bilirubin 12/25/2017 0.8  0.3 - 1.2 mg/dL Final  . GFR calc non Af Amer 12/25/2017 >60  >60 mL/min Final  . GFR calc Af Amer 12/25/2017 >60  >60 mL/min Final   Comment: (NOTE) The eGFR has been calculated using the CKD EPI equation. This calculation has not been validated in all clinical situations. eGFR's persistently <60 mL/min signify possible Chronic Kidney Disease.   Georgiann Hahn gap 12/25/2017 5  5 - 15 Final   Performed at [facility], [place] [address], [place], [place] [address]  . Prothrombin Time 12/25/2017 14.5  11.4 - 15.2 seconds Final  . INR 12/25/2017 1.14   Final   Performed at [facility], [place] [address], [place], [place] [address]  . ABO/RH(D) 12/25/2017 A POS   Final  . Antibody Screen 12/25/2017 NEG   Final  . Sample Expiration 12/25/2017 01/04/2018   Final  . Extend sample reason 12/25/2017    Final                   Value:NO TRANSFUSIONS OR PREGNANCY IN THE PAST 3 MONTHS Performed at [facility], [place] [address], [place],  [address]   . Color, Urine 12/25/2017 YELLOW  YELLOW Final  . APPearance 12/25/2017 HAZY* CLEAR Final  . Specific Gravity, Urine 12/25/2017 1.019  1.005 - 1.030 Final    . pH 12/25/2017 5.0  5.0 - 8.0 Final  . Glucose, UA 12/25/2017 NEGATIVE  NEGATIVE mg/dL Final  . Hgb urine dipstick 12/25/2017 NEGATIVE  NEGATIVE Final  . Bilirubin Urine 12/25/2017 NEGATIVE  NEGATIVE Final  . Ketones, ur 12/25/2017 NEGATIVE  NEGATIVE mg/dL Final  . Protein, ur 12/25/2017 NEGATIVE  NEGATIVE mg/dL Final  . Nitrite 12/25/2017 NEGATIVE  NEGATIVE Final  .  Leukocytes, UA 12/25/2017 NEGATIVE  NEGATIVE Final   Performed at [place] [address], [place], [place] [address]  . MRSA, PCR 12/25/2017 NEGATIVE  NEGATIVE Final  . Staphylococcus aureus 12/25/2017 NEGATIVE  NEGATIVE Final   Comment: (NOTE) The Xpert SA Assay (FDA approved for NASAL specimens in patients 40 years of age and older), is one component of a comprehensive surveillance program. It is not intended to diagnose infection nor to guide or monitor treatment. Performed at [facility], [place] [address], [place], [place] [address]   . ABO/RH(D) 12/25/2017    Final                   Value:A POS Performed at [facility], [place] [address] [place], [place] [address]   Clinical Support on 11/30/2017  Component Date Value Ref Range Status  . Von Willebrand Multimers 11/30/2017 Comment   Final   Comment: (NOTE) VWF multimer analysis shows a normal pattern and distribution of bands. This is the pattern of multimers that occurs in both normal individuals and those with type 1 VW Disease as well as in both type 33M and 2N VW disease. Performed At: [facility] [place], [place] [id number] Mathis Fare MD OZ:[id number] Performed at [facility], [place] [address] [place], [place] [address]   . Coagulation Factor VIII 11/30/2017 155* 56 - 140 % Final                 **Please note reference interval change**  . Ristocetin Co-factor, Plasma 11/30/2017 204* 50 - 200 % Final   Comment: (NOTE) Performed At:  [facility] [place], [place] [id number] Rush Farmer MD UU:[id number]   . Von Willebrand Antigen, Plasma 11/30/2017 274* 50 - 200 % Final   Comment: (NOTE) This test was developed and its performance characteristics determined by [facility]. It has not been cleared or approved by the Food and Drug Administration. VWF may elevate in normal pregnancy, in samples drawn from patients (particularly children) who are visibly stressed at the time of phlebotomy, as acute phase reactants, or in response to certain drug therapies such as DDAVP.  These and other situations may increase levels compared to baseline values.  For these reasons, it may be necessary to repeat testing to make a diagnosis of VWD. Performed at [facility], [place] [address], [place], [place] [address]   . Prothrombin Time 11/30/2017 13.2  11.4 - 15.2 seconds Final  . INR 11/30/2017 1.01   Final   Performed at [facility], [place] [address] [place], [place] [address]  . aPTT 11/30/2017 31  24 - 36 seconds Final   Performed at [facility], [place] [address] [place], [place] [address]  . Fibrinogen 11/30/2017 483* 210 - 475 mg/dL Final   Performed at [place] [address], [place], [place] [address]  . Sodium 11/30/2017 141  135 - 145 mmol/L Final   Please note reference intervals were recently updated.  . Potassium 11/30/2017 4.3  3.5 - 5.1 mmol/L Final  . Chloride 11/30/2017 108  98 - 111 mmol/L Final  . CO2 11/30/2017 28  22 - 32 mmol/L Final  . Glucose, Bld 11/30/2017 86  70 - 99 mg/dL Final  . BUN 11/30/2017 22  8 - 23 mg/dL Final   Please note change in reference range.  . Creatinine 11/30/2017 0.96  0.61 - 1.24 mg/dL Final  . Calcium 11/30/2017 8.9  8.9 - 10.3 mg/dL Final  . Total Protein 11/30/2017 7.0  6.5 - 8.1 g/dL Final  . Albumin 11/30/2017 3.4* 3.5 - 5.0 g/dL Final  . AST 11/30/2017 22  15 - 41 U/L  Final  . ALT 11/30/2017 33  0 - 44 U/L Final  . Alkaline Phosphatase 11/30/2017 82  38 - 126 U/L Final  . Total Bilirubin 11/30/2017 0.2* 0.3 - 1.2 mg/dL Final  . GFR, Est Non Af Am 11/30/2017 >60  >60 mL/min Final  . GFR, Est AFR Am 11/30/2017 >60  >60 mL/min Final   Comment: (NOTE) The eGFR has been calculated using the CKD EPI equation. This calculation has not been validated in all clinical situations. eGFR's persistently <60 mL/min signify possible Chronic Kidney Disease.   Georgiann Hahn gap 11/30/2017 5  5 - 15 Final   Performed at [facility], [place] [address] [place], [place] [address]  . PFA Interpretation 11/30/2017          Final   Comment: Platelet function is normal. If patient history/physical examination give strong indication of a bleeding disorder repeat testing for confirmation.        Results of the test should always be interpreted in conjunction with the patient's medical history, clinical presentation and medication history. Patients with Hematocrit values <35.0% or Platelet counts <150,000/uL may result in values above the Laboratory established reference range.   . Collagen / Epinephrine 11/30/2017 164  0 - 193 seconds Final   Performed at [facility], [address], [place], [place] [address]  . WBC 11/30/2017 5.8  4.0 - 10.3 K/uL Final  . RBC 11/30/2017 4.37  4.20 - 5.82 MIL/uL Final  . Hemoglobin 11/30/2017 13.1  13.0 - 17.1 g/dL Final  . HCT 11/30/2017 40.2  38.4 - 49.9 % Final  . MCV 11/30/2017 91.9  79.3 - 98.0 fL Final  . MCH 11/30/2017 29.9  27.2 - 33.4 pg Final  . MCHC 11/30/2017 32.6  32.0 - 36.0 g/dL Final  . RDW 11/30/2017 13.9  11.0 - 14.6 % Final  . Platelets 11/30/2017 308  140 - 400 K/uL Final  . Neutrophils Relative % 11/30/2017 68  % Final  . Neutro Abs 11/30/2017 3.9  1.5 - 6.5 K/uL Final  . Lymphocytes Relative 11/30/2017 18  % Final  . Lymphs Abs 11/30/2017 1.1  0.9 - 3.3 K/uL Final  . Monocytes Relative  11/30/2017 9  % Final  . Monocytes Absolute 11/30/2017 0.5  0.1 - 0.9 K/uL Final  . Eosinophils Relative 11/30/2017 4  % Final  . Eosinophils Absolute 11/30/2017 0.2  0.0 - 0.5 K/uL Final  . Basophils Relative 11/30/2017 1  % Final  . Basophils Absolute 11/30/2017 0.1  0.0 - 0.1 K/uL Final   Performed at [facility], [place] [address] [place], [place] [address]  . Interpretation 11/30/2017 Note   Final   Comment: (NOTE) ------------------------------- COAGULATION: VON WILLEBRAND FACTOR ASSESSMENT CURRENT RESULTS ASSESSMENT The VWF:Ag is elevated. The VWF:RCo is elevated. The FVIII is elevated. VON WILLEBRAND FACTOR ASSESSMENT CURRENT RESULTS INTERPRETATION - These results are not consistent with a diagnosis of VWD according to the current NHLBI guideline. Persistently elevated FVIII activity is a risk factor for venous thrombosis as well as recurrence of venous thrombosis. Risk is graded and increases with the degree of elevation. Although elevated FVIII activity has been identified to cluster within families, a genetic  basis for the elevation has not yet been elucidated (Br J Haematol. 2012; 157(6):653-663). VON WILLEBRAND FACTOR ASSESSMENT - VWF and FVIII may elevate as compared to baseline in samples drawn from patients (particularly children) who are visibly stressed at the time of phlebotomy, as acute phase reactants, or in response to certain drug therapies such as                           desmopressin. Repeat testing may be necessary before excluding a diagnosis of VWD especially if the clinical suspicion is high for an underlying bleeding disorder. The setting for phlebotomy should be as calm as possible and patients should be encouraged to sit quietly prior to the blood draw. VON WILLEBRAND FACTOR ASSESSMENT DEFINITIONS - VWD - von Willebrand disease; VWF - von Willebrand factor; VWF:Ag - VWF antigen; VWF:RCo - VWF ristocetin  cofactor activity; FVIII - factor VIII activity. - MEDICAL DIRECTOR: For questions regarding panel interpretation, please contact Jake Bathe, M.D. at [facility] at [phone]. ------------------------------- DISCLAIMER These assessments and interpretations are provided as a convenience in support of the physician-patient relationship and are not intended to replace the physician's clinical judgment. They are derived from national guidelines in addition to other evidence and expert opinion. The clin                          ician should consider this information within the context of clinical opinion and the individual patient. SEE GUIDANCE FOR VON WILLEBRAND FACTOR ASSESSMENT: (1) The National Heart, Lung and Blood Institute. The Diagnosis, Evaluation and Management of von Willebrand Disease. Janeal Holmes, MD: [employer] Publication 37-4827. 0786. Available at vSpecials.com.pt. (2) Daryl Eastern et al. Carmin Muskrat J Hematol. 2009; 84(6):366-370. (3) Perry. 2004;10(3):199-217. (4) Pasi KJ et al. Haemophilia. 2004; 10(3):218-231. Performed At: [facility] [place], [place] [id number] Thomasene Ripple MD OF:[id number] Performed at [facility], [place] [address] [place],  [address]      X-Rays:No results found.  EKG: Orders placed or performed in visit on 12/12/17  . EKG 12-Lead     Hospital Course: Joshua Moran is a 72 y.o. who was admitted to [facility]. They were brought to the operating room on 01/01/2018 and underwent Procedure(s): LEFT TOTAL KNEE ARTHROPLASTY.  Patient tolerated the procedure well and was later transferred to the recovery room and then to the orthopaedic floor for postoperative care.  They were given PO and IV analgesics for pain control following their surgery.  They were given 24 hours of postoperative  antibiotics of  Anti-infectives (From admission, onward)   Start     Dose/Rate Route Frequency Ordered Stop   01/01/18 2200  vancomycin (VANCOCIN) IVPB 1000 mg/200 mL premix     1,000 mg 200 mL/hr over 60 Minutes Intravenous Every 12 hours 01/01/18 1337 01/01/18 2210   01/01/18 0600  vancomycin (VANCOCIN) 1,500 mg in sodium chloride 0.9 % 500 mL IVPB     1,500 mg 250 mL/hr over 120 Minutes Intravenous On call to O.R. 12/31/17 1227 01/01/18 1141     and started on DVT prophylaxis in the form of Aspirin.   PT and OT were ordered for total joint protocol.  Discharge planning consulted to help with postop disposition and equipment needs.  Patient had a good night on the evening of surgery. He started to get up  OOB with therapy on POD #0. Patient was seen during rounds on POD #1 and was ready to go home pending progress with therapy. Hemovac drain was pulled without difficulty. Patient completed two additional sessions of PT and was meeting his goals. He was discharged to home later that day in stable condition. Instructed patient that he may change surgical dressing on POD #2 and to shower on POD #3.   Diet: Regular diet Activity:WBAT Follow-up:in 2 weeks with Dr. Wynelle Link Disposition - Home with outpatient PT at [facility] Discharged Condition: stable   Discharge Instructions    Call MD / Call 911   Complete by:  As directed    If you experience chest pain or shortness of breath, CALL 911 and be transported to the hospital emergency room.  If you develope a fever above 101 F, pus (white drainage) or increased drainage or redness at the wound, or calf pain, call your surgeon's office.   Change dressing   Complete by:  As directed    Change dressing on Wednesday (01/03/2018), then change the dressing daily with sterile 4 x 4 inch gauze dressing and apply TED hose.   Constipation Prevention   Complete by:  As directed    Drink plenty of fluids.  Prune juice may be helpful.  You may use a stool  softener, such as Colace (over the counter) 100 mg twice a day.  Use MiraLax (over the counter) for constipation as needed.   Diet general   Complete by:  As directed    Discharge instructions   Complete by:  As directed    Dr. Gaynelle Arabian Total Joint Specialist [facility] [address] [place], [place] [address] [phone]  TOTAL KNEE REPLACEMENT POSTOPERATIVE DIRECTIONS  Knee Rehabilitation, Guidelines Following Surgery  Results after knee surgery are often greatly improved when you follow the exercise, range of motion and muscle strengthening exercises prescribed by your doctor. Safety measures are also important to protect the knee from further injury. Any time any of these exercises cause you to have increased pain or swelling in your knee joint, decrease the amount until you are comfortable again and slowly increase them. If you have problems or questions, call your caregiver or physical therapist for advice.   HOME CARE INSTRUCTIONS  Remove items at home which could result in a fall. This includes throw rugs or furniture in walking pathways.  ICE to the affected knee every three hours for 30 minutes at a time and then as needed for pain and swelling.  Continue to use ice on the knee for pain and swelling from surgery. You may notice swelling that will progress down to the foot and ankle.  This is normal after surgery.  Elevate the leg when you are not up walking on it.   Continue to use the breathing machine which will help keep your temperature down.  It is common for your temperature to cycle up and down following surgery, especially at night when you are not up moving around and exerting yourself.  The breathing machine keeps your lungs expanded and your temperature down. Do not place pillow under knee, focus on keeping the knee straight while resting   DIET You may resume your previous home diet once your are discharged from the hospital.  DRESSING / WOUND CARE  / SHOWERING You may shower 3 days after surgery, but keep the wounds dry during showering.  You may use an occlusive plastic wrap (Press'n Seal for example), NO  SOAKING/SUBMERGING IN THE BATHTUB.  If the bandage gets wet, change with a clean dry gauze.  If the incision gets wet, pat the wound dry with a clean towel. You may start showering once you are discharged home but do not submerge the incision under water. Just pat the incision dry and apply a dry gauze dressing on daily. Change the surgical dressing daily and reapply a dry dressing each time.  ACTIVITY Walk with your walker as instructed. Use walker as long as suggested by your caregivers. Avoid periods of inactivity such as sitting longer than an hour when not asleep. This helps prevent blood clots.  You may resume a sexual relationship in one month or when given the OK by your doctor.  You may return to work once you are cleared by your doctor.  Do not drive a car for 6 weeks or until released by you surgeon.  Do not drive while taking narcotics.  WEIGHT BEARING Weight bearing as tolerated with assist device (walker, cane, etc) as directed, use it as long as suggested by your surgeon or therapist, typically at least 4-6 weeks.  POSTOPERATIVE CONSTIPATION PROTOCOL Constipation - defined medically as fewer than three stools per week and severe constipation as less than one stool per week.  One of the most common issues patients have following surgery is constipation.  Even if you have a regular bowel pattern at home, your normal regimen is likely to be disrupted due to multiple reasons following surgery.  Combination of anesthesia, postoperative narcotics, change in appetite and fluid intake all can affect your bowels.  In order to avoid complications following surgery, here are some recommendations in order to help you during your recovery period.  Colace (docusate) - Pick up an over-the-counter form of Colace or another stool softener  and take twice a day as long as you are requiring postoperative pain medications.  Take with a full glass of water daily.  If you experience loose stools or diarrhea, hold the colace until you stool forms back up.  If your symptoms do not get better within 1 week or if they get worse, check with your doctor.  Dulcolax (bisacodyl) - Pick up over-the-counter and take as directed by the product packaging as needed to assist with the movement of your bowels.  Take with a full glass of water.  Use this product as needed if not relieved by Colace only.   MiraLax (polyethylene glycol) - Pick up over-the-counter to have on hand.  MiraLax is a solution that will increase the amount of water in your bowels to assist with bowel movements.  Take as directed and can mix with a glass of water, juice, soda, coffee, or tea.  Take if you go more than two days without a movement. Do not use MiraLax more than once per day. Call your doctor if you are still constipated or irregular after using this medication for 7 days in a row.  If you continue to have problems with postoperative constipation, please contact the office for further assistance and recommendations.  If you experience "the worst abdominal pain ever" or develop nausea or vomiting, please contact the office immediatly for further recommendations for treatment.  ITCHING  If you experience itching with your medications, try taking only a single pain pill, or even half a pain pill at a time.  You can also use Benadryl over the counter for itching or also to help with sleep.   TED HOSE STOCKINGS Wear the elastic stockings  on both legs for three weeks following surgery during the day but you may remove then at night for sleeping.  MEDICATIONS See your medication summary on the "After Visit Summary" that the nursing staff will review with you prior to discharge.  You may have some home medications which will be placed on hold until you complete the course of  blood thinner medication.  It is important for you to complete the blood thinner medication as prescribed by your surgeon.  Continue your approved medications as instructed at time of discharge.  PRECAUTIONS If you experience chest pain or shortness of breath - call 911 immediately for transfer to the hospital emergency department.  If you develop a fever greater that 101 F, purulent drainage from wound, increased redness or drainage from wound, foul odor from the wound/dressing, or calf pain - CONTACT YOUR SURGEON.                                                   FOLLOW-UP APPOINTMENTS Make sure you keep all of your appointments after your operation with your surgeon and caregivers. You should call the office at the above phone number and make an appointment for approximately two weeks after the date of your surgery or on the date instructed by your surgeon outlined in the "After Visit Summary".   RANGE OF MOTION AND STRENGTHENING EXERCISES  Rehabilitation of the knee is important following a knee injury or an operation. After just a few days of immobilization, the muscles of the thigh which control the knee become weakened and shrink (atrophy). Knee exercises are designed to build up the tone and strength of the thigh muscles and to improve knee motion. Often times heat used for twenty to thirty minutes before working out will loosen up your tissues and help with improving the range of motion but do not use heat for the first two weeks following surgery. These exercises can be done on a training (exercise) mat, on the floor, on a table or on a bed. Use what ever works the best and is most comfortable for you Knee exercises include:  Leg Lifts - While your knee is still immobilized in a splint or cast, you can do straight leg raises. Lift the leg to 60 degrees, hold for 3 sec, and slowly lower the leg. Repeat 10-20 times 2-3 times daily. Perform this exercise against resistance later as your knee gets  better.  Quad and Hamstring Sets - Tighten up the muscle on the front of the thigh (Quad) and hold for 5-10 sec. Repeat this 10-20 times hourly. Hamstring sets are done by pushing the foot backward against an object and holding for 5-10 sec. Repeat as with quad sets.  Leg Slides: Lying on your back, slowly slide your foot toward your buttocks, bending your knee up off the floor (only go as far as is comfortable). Then slowly slide your foot back down until your leg is flat on the floor again. Angel Wings: Lying on your back spread your legs to the side as far apart as you can without causing discomfort.  A rehabilitation program following serious knee injuries can speed recovery and prevent re-injury in the future due to weakened muscles. Contact your doctor or a physical therapist for more information on knee rehabilitation.   IF YOU ARE TRANSFERRED TO A SKILLED REHAB  FACILITY If the patient is transferred to a skilled rehab facility following release from the hospital, a list of the current medications will be sent to the facility for the patient to continue.  When discharged from the skilled rehab facility, please have the facility set up the patient's [place] prior to being released. Also, the skilled facility will be responsible for providing the patient with their medications at time of release from the facility to include their pain medication, the muscle relaxants, and their blood thinner medication. If the patient is still at the rehab facility at time of the two week follow up appointment, the skilled rehab facility will also need to assist the patient in arranging follow up appointment in our office and any transportation needs.  MAKE SURE YOU:  Understand these instructions.  Get help right away if you are not doing well or get worse.    Pick up stool softner and laxative for home use following surgery while on pain medications. Do not submerge incision under  water. Please use good hand washing techniques while changing dressing each day. May shower starting three days after surgery. Please use a clean towel to pat the incision dry following showers. Continue to use ice for pain and swelling after surgery. Do not use any lotions or creams on the incision until instructed by your surgeon.   Do not put a pillow under the knee. Place it under the heel.   Complete by:  As directed    Driving restrictions   Complete by:  As directed    No driving for two weeks   TED hose   Complete by:  As directed    Use stockings (TED hose) for three weeks on both leg(s).  You may remove them at night for sleeping.   Weight bearing as tolerated   Complete by:  As directed      Allergies as of 01/02/2018      Reactions   Penicillins Other (See Comments)   Very flushed  Has patient had a PCN reaction causing immediate rash, facial/tongue/throat swelling, SOB or lightheadedness with hypotension: No Has patient had a PCN reaction causing severe rash involving mucus membranes or skin necrosis: No Has patient had a PCN reaction that required hospitalization No Has patient had a PCN reaction occurring within the last 10 years: No If all of the above answers are "NO", then may proceed with Cephalosporin use.   Statins Other (See Comments)   Pt c/o muscle aches with use of statins.      Medication List    STOP taking these medications   aspirin 81 MG tablet Replaced by:  aspirin 325 MG EC tablet   Glucosamine 750 MG Tabs   meloxicam 15 MG tablet Commonly known as:  MOBIC     TAKE these medications   aspirin 325 MG EC tablet Take 1 tablet (325 mg total) by mouth 2 (two) times daily for 20 days. Take one tablet (325 mg) Aspirin two times a day for three weeks following surgery. Then resume one baby Aspirin (81 mg) once a day. Replaces:  aspirin 81 MG tablet   brimonidine 0.15 % ophthalmic solution Commonly known as:  ALPHAGAN Place 1 drop into the  right eye 2 (two) times daily.   CENTRUM SILVER ADULT 50+ Tabs Take 1 tablet by mouth daily with breakfast.   cetirizine 10 MG tablet Commonly known as:  ZYRTEC Take 10 mg by mouth every evening.   ferrous sulfate  325 (65 FE) MG tablet Take 325 mg by mouth daily with breakfast.   finasteride 5 MG tablet Commonly known as:  PROSCAR Take 5 mg by mouth daily.   fluticasone 50 MCG/ACT nasal spray Commonly known as:  FLONASE Place 2 sprays into both nostrils 2 (two) times daily.   gabapentin 300 MG capsule Commonly known as:  NEURONTIN Take 1 capsule (300 mg total) by mouth 3 (three) times daily. Gabapentin 300 mg Protocol Take a 300 mg capsule three times a day for two weeks following surgery. Then take a 300 mg capsule two times a day for two weeks.  Then take a 300 mg capsule once a day for two weeks.  Then discontinue the Gabapentin.   KETOROLAC TROMETHAMINE OP Place 1 drop into both eyes 2 (two) times daily. Ketorolac tromethamine 0.5% solution   methocarbamol 500 MG tablet Commonly known as:  ROBAXIN Take 1 tablet (500 mg total) by mouth every 6 (six) hours as needed for muscle spasms.   oxyCODONE 5 MG immediate release tablet Commonly known as:  Oxy IR/ROXICODONE Take 1-2 tablets (5-10 mg total) by mouth every 6 (six) hours as needed for moderate pain (pain score 4-6).   rosuvastatin 5 MG tablet Commonly known as:  CRESTOR Take 1 tablet (5 mg total) by mouth every other day. What changed:    when to take this  additional instructions   sildenafil 20 MG tablet Commonly known as:  REVATIO Take 20 mg by mouth daily as needed.   tamsulosin 0.4 MG Caps capsule Commonly known as:  FLOMAX Take 0.4 mg by mouth daily after supper.   traMADol 50 MG tablet Commonly known as:  ULTRAM Take 1-2 tablets (50-100 mg total) by mouth every 6 (six) hours as needed for moderate pain.   TYLENOL 500 MG tablet Generic drug:  acetaminophen Take 1,000 mg by mouth every 6 (six)  hours as needed for mild pain, moderate pain, fever or headache.   Vitamin D3 2000 units Tabs Take 2,000 Units by mouth daily.            Discharge Care Instructions  (From admission, onward)        Start     Ordered   01/02/18 0000  Weight bearing as tolerated     01/02/18 0714   01/02/18 0000  Change dressing    Comments:  Change dressing on Wednesday (01/03/2018), then change the dressing daily with sterile 4 x 4 inch gauze dressing and apply TED hose.   01/02/18 2395     Follow-up Information    Gaynelle Arabian, MD. Schedule an appointment as soon as possible for a visit on 01/16/2018.   Specialty:  Orthopedic Surgery Contact information: [address] [place] [address] [phone]           Signed: Theresa Duty, PA-C Orthopaedic Surgery 01/02/2018, 3:12 PM

## 2018-01-02 NOTE — Progress Notes (Cosign Needed)
   Subjective: 1 Day Post-Op Procedure(s) (LRB): LEFT TOTAL KNEE ARTHROPLASTY (Left) Patient reports pain as mild.   Patient seen in rounds by Dr. Wynelle Link. Patient is well, and has had no acute complaints or problems other than pain in the left knee. Foley catheter removed this AM. Denies chest pain, SOB, or calf pain. Did well ambulating with PT yesterday, will continue working with therapy today.   Objective: Vital signs in last 24 hours: Temp:  [97.4 F (36.3 C)-98.7 F (37.1 C)] 98.1 F (36.7 C) (07/30 0558) Pulse Rate:  [55-71] 61 (07/30 0558) Resp:  [10-18] 16 (07/30 0558) BP: (110-166)/(66-97) 147/80 (07/30 0558) SpO2:  [89 %-100 %] 99 % (07/30 0558) Weight:  [122.5 kg (270 lb)] 122.5 kg (270 lb) (07/29 0832)  Intake/Output from previous day:  Intake/Output Summary (Last 24 hours) at 01/02/2018 0710 Last data filed at 01/02/2018 0600 Gross per 24 hour  Intake 4494.17 ml  Output 3735 ml  Net 759.17 ml     Labs: Recent Labs    01/02/18 0533  HGB 11.9*   Recent Labs    01/02/18 0533  WBC 11.5*  RBC 3.96*  HCT 36.3*  PLT 208   Recent Labs    01/02/18 0533  NA 140  K 4.0  CL 110  CO2 26  BUN 12  CREATININE 0.70  GLUCOSE 134*  CALCIUM 8.3*    Exam: General - Patient is Alert and Oriented Extremity - Neurologically intact Neurovascular intact Sensation intact distally Dorsiflexion/Plantar flexion intact Dressing - dressing C/D/I Motor Function - intact, moving foot and toes well on exam.   Past Medical History:  Diagnosis Date  . Abnormal screening CT of chest    coronary calcium in left main and RCA  . Anemia   . Arthritis   . Complication of anesthesia    Pt reports difficult urinating  . GERD (gastroesophageal reflux disease)   . Glaucoma   . Hyperlipidemia    statin intolerant    Assessment/Plan: 1 Day Post-Op Procedure(s) (LRB): LEFT TOTAL KNEE ARTHROPLASTY (Left) Principal Problem:   OA (osteoarthritis) of knee  Estimated body  mass index is 36.62 kg/m as calculated from the following:   Height as of this encounter: 6' (1.829 m).   Weight as of this encounter: 122.5 kg (270 lb). Advance diet Up with therapy D/C IV fluids  Anticipated LOS equal to or greater than 2 midnights due to - Age 62 and older with one or more of the following:  - Obesity  - Expected need for hospital services (PT, OT, Nursing) required for safe  discharge  - Anticipated need for postoperative skilled nursing care or inpatient rehab  - Active co-morbidities: Anemia OR   - Unanticipated findings during/Post Surgery: None  - Patient is a high risk of re-admission due to: None    DVT Prophylaxis - Aspirin Weight bearing as tolerated. D/C O2 and pulse ox and try on room air. Hemovac pulled without difficulty, will begin therapy today.  Plan is to go Home after hospital stay. Possible discharge this afternoon if progresses with therapy and meeting goals. Scheduled for outpatient PT at [facility]. Follow-up in the office in 2 weeks with Dr. Wynelle Link.  Theresa Duty, PA-C Orthopedic Surgery 01/02/2018, 7:10 AM

## 2018-01-02 NOTE — Consult Note (Signed)
   [facility] CM Inpatient Consult   01/02/2018  WASEEM SUESS 1946/04/08 [id number]   Spoke with Mr. Vuncannon at bedside to discuss [place] Management program. He denies having any [facility] Care Management needs.   Provided [facility] Care Management brochure with 24-hr nurse advice line magnet.   Expressed appreciation of visit.   Marthenia Rolling, MSN-Ed, RN,BSN [facility] Liaison [phone]

## 2018-01-02 NOTE — Progress Notes (Signed)
Physical Therapy Treatment Patient Details Name: Joshua Moran MRN: [id number] DOB: 10/30/1945 Today's Date: 01/02/2018    History of Present Illness s/p L TKA; PMHx: obesity    PT Comments    POD # 1 am session Applied KI and instructed on use.  Assisted OOB to amb in hallway.  Distance limited by increased c/o feeling "hot" and mild dizziness.  Returned to room and performed some TKR TE's followed by ICE.  Pt will need another PT session to address stairs.   Follow Up Recommendations  Follow surgeon's recommendation for DC plan and follow-up therapies     Equipment Recommendations  None recommended by PT    Recommendations for Other Services       Precautions / Restrictions Precautions Precautions: Fall;Knee Precaution Comments: instructed on KI use  "other" knee is bad Required Braces or Orthoses: Knee Immobilizer - Left Knee Immobilizer - Left: Discontinue once straight leg raise with < 10 degree lag Restrictions Weight Bearing Restrictions: No Other Position/Activity Restrictions: WBAT    Mobility  Bed Mobility Overal bed mobility: Needs Assistance Bed Mobility: Supine to Sit     Supine to sit: Min guard     General bed mobility comments: incr time, min/guard for LLE  Transfers Overall transfer level: Needs assistance Equipment used: Rolling walker (2 wheeled) Transfers: Sit to/from Stand Sit to Stand: Min guard;Min assist         General transfer comment: elevated bed and increased time  Ambulation/Gait Ambulation/Gait assistance: Min guard Gait Distance (Feet): 35 Feet Assistive device: Rolling walker (2 wheeled) Gait Pattern/deviations: Step-to pattern;Wide base of support;Decreased weight shift to left Gait velocity: decreased   General Gait Details: cues for sequence initially then increased c/o feeling "hot" and mild dizzy     BP 138/86   Stairs             Wheelchair Mobility    Modified Rankin (Stroke Patients Only)        Balance                                            Cognition Arousal/Alertness: Awake/alert Behavior During Therapy: WFL for tasks assessed/performed Overall Cognitive Status: Within Functional Limits for tasks assessed                                        Exercises   Total Knee Replacement TE's 10 reps B LE ankle pumps 10 reps towel squeezes 10 reps knee presses 10 reps heel slides  10 reps SAQ's 10 reps SLR's 10 reps ABD Followed by ICE     General Comments        Pertinent Vitals/Pain Pain Assessment: 0-10 Pain Score: 4  Pain Location: left knee Pain Descriptors / Indicators: Sore Pain Intervention(s): Monitored during session;Repositioned;Ice applied    Home Living                      Prior Function            PT Goals (current goals can now be found in the care plan section) Progress towards PT goals: Progressing toward goals    Frequency    7X/week      PT Plan Current plan remains appropriate    Co-evaluation  AM-PAC PT "6 Clicks" Daily Activity  Outcome Measure  Difficulty turning over in bed (including adjusting bedclothes, sheets and blankets)?: A Lot Difficulty moving from lying on back to sitting on the side of the bed? : A Lot Difficulty sitting down on and standing up from a chair with arms (e.g., wheelchair, bedside commode, etc,.)?: A Lot Help needed moving to and from a bed to chair (including a wheelchair)?: A Lot Help needed walking in hospital room?: A Lot Help needed climbing 3-5 steps with a railing? : A Lot 6 Click Score: 12    End of Session Equipment Utilized During Treatment: Gait belt;Left knee immobilizer Activity Tolerance: Patient tolerated treatment well Patient left: with call bell/phone within reach;in chair;with chair alarm set;with family/visitor present   PT Visit Diagnosis: Difficulty in walking, not elsewhere classified (R26.2)     Time:  0935-1000 PT Time Calculation (min) (ACUTE ONLY): 25 min  Charges:  $Gait Training: 8-22 mins $Therapeutic Exercise: 8-22 mins                     Rica Koyanagi  PTA [facility]  Acute  Rehab Pager      [phone]

## 2018-01-05 DIAGNOSIS — M25662 Stiffness of left knee, not elsewhere classified: Secondary | ICD-10-CM | POA: Diagnosis not present

## 2018-01-08 DIAGNOSIS — M25662 Stiffness of left knee, not elsewhere classified: Secondary | ICD-10-CM | POA: Diagnosis not present

## 2018-01-15 DIAGNOSIS — M25662 Stiffness of left knee, not elsewhere classified: Secondary | ICD-10-CM | POA: Diagnosis not present

## 2018-01-16 DIAGNOSIS — Z96652 Presence of left artificial knee joint: Secondary | ICD-10-CM | POA: Insufficient documentation

## 2018-01-17 DIAGNOSIS — M25662 Stiffness of left knee, not elsewhere classified: Secondary | ICD-10-CM | POA: Diagnosis not present

## 2018-01-19 DIAGNOSIS — M25662 Stiffness of left knee, not elsewhere classified: Secondary | ICD-10-CM | POA: Diagnosis not present

## 2018-01-22 DIAGNOSIS — M25662 Stiffness of left knee, not elsewhere classified: Secondary | ICD-10-CM | POA: Diagnosis not present

## 2018-01-24 DIAGNOSIS — M25662 Stiffness of left knee, not elsewhere classified: Secondary | ICD-10-CM | POA: Diagnosis not present

## 2018-01-26 DIAGNOSIS — M25662 Stiffness of left knee, not elsewhere classified: Secondary | ICD-10-CM | POA: Diagnosis not present

## 2018-01-29 DIAGNOSIS — M25662 Stiffness of left knee, not elsewhere classified: Secondary | ICD-10-CM | POA: Diagnosis not present

## 2018-01-31 DIAGNOSIS — M25662 Stiffness of left knee, not elsewhere classified: Secondary | ICD-10-CM | POA: Diagnosis not present

## 2018-02-02 DIAGNOSIS — M25662 Stiffness of left knee, not elsewhere classified: Secondary | ICD-10-CM | POA: Diagnosis not present

## 2018-02-06 DIAGNOSIS — M1711 Unilateral primary osteoarthritis, right knee: Secondary | ICD-10-CM | POA: Diagnosis not present

## 2018-02-06 DIAGNOSIS — M1712 Unilateral primary osteoarthritis, left knee: Secondary | ICD-10-CM | POA: Diagnosis not present

## 2018-02-12 DIAGNOSIS — H52203 Unspecified astigmatism, bilateral: Secondary | ICD-10-CM | POA: Diagnosis not present

## 2018-02-12 DIAGNOSIS — H524 Presbyopia: Secondary | ICD-10-CM | POA: Diagnosis not present

## 2018-02-12 DIAGNOSIS — H5213 Myopia, bilateral: Secondary | ICD-10-CM | POA: Diagnosis not present

## 2018-03-14 DIAGNOSIS — I2510 Atherosclerotic heart disease of native coronary artery without angina pectoris: Secondary | ICD-10-CM | POA: Diagnosis not present

## 2018-03-14 LAB — HEPATIC FUNCTION PANEL
ALK PHOS: 81 IU/L (ref 39–117)
ALT: 14 IU/L (ref 0–44)
AST: 15 IU/L (ref 0–40)
Albumin: 3.8 g/dL (ref 3.5–4.8)
Bilirubin Total: 0.5 mg/dL (ref 0.0–1.2)
Bilirubin, Direct: 0.15 mg/dL (ref 0.00–0.40)
Total Protein: 6.3 g/dL (ref 6.0–8.5)

## 2018-03-14 LAB — LIPID PANEL
CHOLESTEROL TOTAL: 177 mg/dL (ref 100–199)
Chol/HDL Ratio: 3.7 ratio (ref 0.0–5.0)
HDL: 48 mg/dL (ref >39)
LDL Calculated: 114 mg/dL — ABNORMAL HIGH (ref 0–99)
TRIGLYCERIDES: 74 mg/dL (ref 0–149)
VLDL CHOLESTEROL CAL: 15 mg/dL (ref 5–40)

## 2018-03-19 ENCOUNTER — Other Ambulatory Visit: Payer: Self-pay | Admitting: *Deleted

## 2018-03-19 DIAGNOSIS — I2510 Atherosclerotic heart disease of native coronary artery without angina pectoris: Secondary | ICD-10-CM

## 2018-03-19 MED ORDER — ROSUVASTATIN CALCIUM 5 MG PO TABS: 5.0000 mg | ORAL_TABLET | Freq: Every day | ORAL | 3 refills | Status: DC

## 2018-04-10 NOTE — H&P (Signed)
TOTAL KNEE ADMISSION H&P  Patient is being admitted for right total knee arthroplasty.  Subjective:  Chief Complaint:right knee pain.  HPI: Joshua Moran, 72 y.o. male, has a history of pain and functional disability in the right knee due to arthritis and has failed non-surgical conservative treatments for greater than 12 weeks to includecorticosteriod injections, viscosupplementation injections and activity modification.  Onset of symptoms was gradual, starting several years ago with gradually worsening course since that time. The patient noted no past surgery on the right knee(s).  Patient currently rates pain in the right knee(s) at 7 out of 10 with activity. Patient has worsening of pain with activity and weight bearing, pain that interferes with activities of daily living, crepitus and joint swelling.  Patient has evidence of bone-on-bone arthritis by imaging studies. There is no active infection.  Patient Active Problem List   Diagnosis Date Noted  . OA (osteoarthritis) of knee 01/01/2018  . Dyspnea 09/19/2017  . Hyperlipidemia 05/05/2015  . Abnormal CT scan of heart 05/05/2015   Past Medical History:  Diagnosis Date  . Abnormal screening CT of chest    coronary calcium in left main and RCA  . Anemia   . Arthritis   . Complication of anesthesia    Pt reports difficult urinating  . GERD (gastroesophageal reflux disease)   . Glaucoma   . Hyperlipidemia    statin intolerant    Past Surgical History:  Procedure Laterality Date  . KNEE ARTHROSCOPY W/ DEBRIDEMENT    . RETINAL DETACHMENT SURGERY Bilateral   . TOTAL KNEE ARTHROPLASTY Left 01/01/2018   Procedure: LEFT TOTAL KNEE ARTHROPLASTY;  Surgeon: Gaynelle Arabian, MD;  Location: [facility];  Service: Orthopedics;  Laterality: Left;  50 mins  . VASECTOMY      No current facility-administered medications for this encounter.    Current Outpatient Medications  Medication Sig Dispense Refill Last Dose  . acetaminophen (TYLENOL) 500  MG tablet Take 1,000 mg by mouth every 6 (six) hours as needed for mild pain, moderate pain, fever or headache.   More than a month at Unknown time  . brimonidine (ALPHAGAN) 0.15 % ophthalmic solution Place 1 drop into the right eye 2 (two) times daily.   01/01/2018 at Unknown time  . cetirizine (ZYRTEC) 10 MG tablet Take 10 mg by mouth every evening.   12/31/2017 at Unknown time  . Cholecalciferol (VITAMIN D3) 2000 units TABS Take 2,000 Units by mouth daily.   Past Week at Unknown time  . ferrous sulfate 325 (65 FE) MG tablet Take 325 mg by mouth daily with breakfast.   01/01/2018 at Unknown time  . finasteride (PROSCAR) 5 MG tablet Take 5 mg by mouth daily.   Past Week at Unknown time  . fluticasone (FLONASE) 50 MCG/ACT nasal spray Place 2 sprays into both nostrils 2 (two) times daily.    01/01/2018 at Unknown time  . gabapentin (NEURONTIN) 300 MG capsule Take 1 capsule (300 mg total) by mouth 3 (three) times daily. Gabapentin 300 mg Protocol Take a 300 mg capsule three times a day for two weeks following surgery. Then take a 300 mg capsule two times a day for two weeks.  Then take a 300 mg capsule once a day for two weeks.  Then discontinue the Gabapentin. 84 capsule 0   . KETOROLAC TROMETHAMINE OP Place 1 drop into both eyes 2 (two) times daily. Ketorolac tromethamine 0.5% solution   12/31/2017 at Unknown time  . methocarbamol (ROBAXIN) 500 MG tablet Take  1 tablet (500 mg total) by mouth every 6 (six) hours as needed for muscle spasms. 40 tablet 0   . Multiple Vitamins-Minerals (CENTRUM SILVER ADULT 50+) TABS Take 1 tablet by mouth daily with breakfast.   Past Week at Unknown time  . oxyCODONE (OXY IR/ROXICODONE) 5 MG immediate release tablet Take 1-2 tablets (5-10 mg total) by mouth every 6 (six) hours as needed for moderate pain (pain score 4-6). 56 tablet 0   . rosuvastatin (CRESTOR) 5 MG tablet Take 1 tablet (5 mg total) by mouth daily at 6 PM. 90 tablet 3   . sildenafil (REVATIO) 20 MG tablet  Take 20 mg by mouth daily as needed.  5 More than a month at Unknown time  . tamsulosin (FLOMAX) 0.4 MG CAPS capsule Take 0.4 mg by mouth daily after supper.   12/31/2017 at Unknown time  . traMADol (ULTRAM) 50 MG tablet Take 1-2 tablets (50-100 mg total) by mouth every 6 (six) hours as needed for moderate pain. 40 tablet 0    Allergies  Allergen Reactions  . Penicillins Other (See Comments)    Very flushed  Has patient had a PCN reaction causing immediate rash, facial/tongue/throat swelling, SOB or lightheadedness with hypotension: No Has patient had a PCN reaction causing severe rash involving mucus membranes or skin necrosis: No Has patient had a PCN reaction that required hospitalization No Has patient had a PCN reaction occurring within the last 10 years: No If all of the above answers are "NO", then may proceed with Cephalosporin use.   . Statins Other (See Comments)    Pt c/o muscle aches with use of statins.    Social History   Tobacco Use  . Smoking status: Never Smoker  . Smokeless tobacco: Never Used  Substance Use Topics  . Alcohol use: Yes    Comment: Rare    Family History  Problem Relation Age of Onset  . Lung cancer Mother        smoker     Review of Systems  Constitutional: Negative for chills and fever.  HENT: Negative for congestion, sore throat and tinnitus.   Eyes: Negative for double vision, photophobia and pain.  Respiratory: Negative for cough, shortness of breath and wheezing.   Cardiovascular: Negative for chest pain, palpitations and orthopnea.  Gastrointestinal: Negative for heartburn, nausea and vomiting.  Genitourinary: Negative for dysuria, frequency and urgency.  Musculoskeletal: Positive for joint pain.  Neurological: Negative for dizziness, weakness and headaches.    Objective:  Physical Exam  Well nourished and well developed. General: Alert and oriented x3, cooperative and pleasant, no acute distress. Head: normocephalic, atraumatic,  neck supple. Eyes: EOMI. Respiratory: breath sounds clear in all fields, no wheezing, rales, or rhonchi. Cardiovascular: Regular rate and rhythm, no murmurs, gallops or rubs.  Abdomen: non-tender to palpation and soft, normoactive bowel sounds. Musculoskeletal: Minimal to no antalgic gait, favoring his right knee.  Left knee exam: Minimal swelling. Well healed incisions, no erythema, nontender. Range of motion: 3 - 120 degrees. Knee is stable and nontender. Right Knee Exam: No effusion. No Swelling. Range of motion is 5-125 degrees. Marked crepitus on range of motion of the knee. Tenderness is greater medially than laterally. Stable knee. Calves soft and nontender. Motor function intact in LE. Strength 5/5 LE bilaterally. Neuro: Distal pulses 2+. Sensation to light touch intact in LE.  Vital signs in last 24 hours: Blood pressure: 116/76 mmHg Pulse: 72 bpm  Labs:   Estimated body mass index is 36.Gerber  kg/m as calculated from the following:   Height as of 01/01/18: 6' (1.829 m).   Weight as of 01/01/18: 122.5 kg.   Imaging Review Plain radiographs demonstrate severe degenerative joint disease of the right knee(s). The overall alignment isneutral. The bone quality appears to be adequate for age and reported activity level.   Preoperative templating of the joint replacement has been completed, documented, and submitted to the Operating Room personnel in order to optimize intra-operative equipment management.   Anticipated LOS equal to or greater than 2 midnights due to - Age 36 and older with one or more of the following:  - Obesity  - Expected need for hospital services (PT, OT, Nursing) required for safe  discharge  - Anticipated need for postoperative skilled nursing care or inpatient rehab  - Active co-morbidities: Anemia OR   - Unanticipated findings during/Post Surgery: None  - Patient is a high risk of re-admission due to: None     Assessment/Plan:  End stage  arthritis, right knee   The patient history, physical examination, clinical judgment of the provider and imaging studies are consistent with end stage degenerative joint disease of the right knee(s) and total knee arthroplasty is deemed medically necessary. The treatment options including medical management, injection therapy arthroscopy and arthroplasty were discussed at length. The risks and benefits of total knee arthroplasty were presented and reviewed. The risks due to aseptic loosening, infection, stiffness, patella tracking problems, thromboembolic complications and other imponderables were discussed. The patient acknowledged the explanation, agreed to proceed with the plan and consent was signed. Patient is being admitted for inpatient treatment for surgery, pain control, PT, OT, prophylactic antibiotics, VTE prophylaxis, progressive ambulation and ADL's and discharge planning. The patient is planning to be discharged home with outpatient physical therapy.   Therapy Plans: outpatient therapy at [facility] Disposition: Home with wife Planned DVT Prophylaxis: Aspirin 325 mg BID DME needed: None PCP: Dr. Melinda Crutch Cardiologist: Dr. Alvester Chou TXA: IV Allergies: PCN (hives) Anesthesia Concerns: None  - Patient was instructed on what medications to stop prior to surgery. - Follow-up visit in 2 weeks with Dr. Wynelle Link - Begin physical therapy following surgery - Pre-operative lab work as pre-surgical testing - Prescriptions will be provided in hospital at time of discharge  Theresa Duty, PA-C Orthopedic Surgery [facility] [place]

## 2018-04-11 DIAGNOSIS — R972 Elevated prostate specific antigen [PSA]: Secondary | ICD-10-CM | POA: Diagnosis not present

## 2018-04-16 NOTE — Progress Notes (Signed)
12-12-17 (Epic) EKG and LOV w/Dr. Gwenlyn Found with f/u in 1 year  09-05-17 (Epic) CXR  09-27-17 (Epic) ECHO

## 2018-04-16 NOTE — Patient Instructions (Addendum)
Joshua Moran  04/16/2018   Your procedure is scheduled on: 04-23-18     Report to [facility] Main  Entrance    Report to Admitting at 8:00 AM    Call this number if you have problems the morning of surgery [phone]    Remember: Do not eat food or drink liquids :After Midnight.     Take these medicines the morning of surgery with A SIP OF WATER: Finasteride (Proscar). You may also bring and use your nasal spray and eyedrops as needed.                 BRUSH YOUR TEETH MORNING OF SURGERY AND RINSE YOUR MOUTH OUT, NO CHEWING GUM CANDY OR MINTS.              You may not have any metal on your body including hair pins and              piercings  Do not wear jewelry, lotions, powders,  cologne, or deodorant             Men may shave face and neck.   Do not bring valuables to the hospital. [place].  Contacts, dentures or bridgework may not be worn into surgery.  Leave suitcase in the car. After surgery it may be brought to your room.      Special Instructions: N/A              Please read over the following fact sheets you were given: _____________________________________________________________________             [facility] - Preparing for Surgery Before surgery, you can play an important role.  Because skin is not sterile, your skin needs to be as free of germs as possible.  You can reduce the number of germs on your skin by washing with CHG (chlorahexidine gluconate) soap before surgery.  CHG is an antiseptic cleaner which kills germs and bonds with the skin to continue killing germs even after washing. Please DO NOT use if you have an allergy to CHG or antibacterial soaps.  If your skin becomes reddened/irritated stop using the CHG and inform your nurse when you arrive at Short Stay. Do not shave (including legs and underarms) for at least 48 hours prior to the first CHG shower.  You may shave your  face/neck. Please follow these instructions carefully:  1.  Shower with CHG Soap the night before surgery and the  morning of Surgery.  2.  If you choose to wash your hair, wash your hair first as usual with your  normal  shampoo.  3.  After you shampoo, rinse your hair and body thoroughly to remove the  shampoo.                           4.  Use CHG as you would any other liquid soap.  You can apply chg directly  to the skin and wash                       Gently with a scrungie or clean washcloth.  5.  Apply the CHG Soap to your body ONLY FROM THE NECK DOWN.   Do not use on  face/ open                           Wound or open sores. Avoid contact with eyes, ears mouth and genitals (private parts).                       Wash face,  Genitals (private parts) with your normal soap.             6.  Wash thoroughly, paying special attention to the area where your surgery  will be performed.  7.  Thoroughly rinse your body with warm water from the neck down.  8.  DO NOT shower/wash with your normal soap after using and rinsing off  the CHG Soap.                9.  Pat yourself dry with a clean towel.            10.  Wear clean pajamas.            11.  Place clean sheets on your bed the night of your first shower and do not  sleep with pets. Day of Surgery : Do not apply any lotions/deodorants the morning of surgery.  Please wear clean clothes to the hospital/surgery center.  FAILURE TO FOLLOW THESE INSTRUCTIONS MAY RESULT IN THE CANCELLATION OF YOUR SURGERY PATIENT SIGNATURE_________________________________  NURSE SIGNATURE__________________________________  ________________________________________________________________________   Adam Phenix  An incentive spirometer is a tool that can help keep your lungs clear and active. This tool measures how well you are filling your lungs with each breath. Taking long deep breaths may help reverse or decrease the chance of developing breathing  (pulmonary) problems (especially infection) following:  A long period of time when you are unable to move or be active. BEFORE THE PROCEDURE   If the spirometer includes an indicator to show your best effort, your nurse or respiratory therapist will set it to a desired goal.  If possible, sit up straight or lean slightly forward. Try not to slouch.  Hold the incentive spirometer in an upright position. INSTRUCTIONS FOR USE  1. Sit on the edge of your bed if possible, or sit up as far as you can in bed or on a chair. 2. Hold the incentive spirometer in an upright position. 3. Breathe out normally. 4. Place the mouthpiece in your mouth and seal your lips tightly around it. 5. Breathe in slowly and as deeply as possible, raising the piston or the ball toward the top of the column. 6. Hold your breath for 3-5 seconds or for as long as possible. Allow the piston or ball to fall to the bottom of the column. 7. Remove the mouthpiece from your mouth and breathe out normally. 8. Rest for a few seconds and repeat Steps 1 through 7 at least 10 times every 1-2 hours when you are awake. Take your time and take a few normal breaths between deep breaths. 9. The spirometer may include an indicator to show your best effort. Use the indicator as a goal to work toward during each repetition. 10. After each set of 10 deep breaths, practice coughing to be sure your lungs are clear. If you have an incision (the cut made at the time of surgery), support your incision when coughing by placing a pillow or rolled up towels firmly against it. Once you are able to get out of bed, walk around indoors  and cough well. You may stop using the incentive spirometer when instructed by your caregiver.  RISKS AND COMPLICATIONS  Take your time so you do not get dizzy or light-headed.  If you are in pain, you may need to take or ask for pain medication before doing incentive spirometry. It is harder to take a deep breath if you  are having pain. AFTER USE  Rest and breathe slowly and easily.  It can be helpful to keep track of a log of your progress. Your caregiver can provide you with a simple table to help with this. If you are using the spirometer at home, follow these instructions: [place] IF:   You are having difficultly using the spirometer.  You have trouble using the spirometer as often as instructed.  Your pain medication is not giving enough relief while using the spirometer.  You develop fever of 100.5 F (38.1 C) or higher. SEEK IMMEDIATE MEDICAL CARE IF:   You cough up bloody sputum that had not been present before.  You develop fever of 102 F (38.9 C) or greater.  You develop worsening pain at or near the incision site. MAKE SURE YOU:   Understand these instructions.  Will watch your condition.  Will get help right away if you are not doing well or get worse. Document Released: 10/03/2006 Document Revised: 08/15/2011 Document Reviewed: 12/04/2006 Exit[employer] Patient Information 2014 [employer], [place].   ________________________________________________________________________  WHAT IS A BLOOD TRANSFUSION? Blood Transfusion Information  A transfusion is the replacement of blood or some of its parts. Blood is made up of multiple cells which provide different functions.  Red blood cells carry oxygen and are used for blood loss replacement.  White blood cells fight against infection.  Platelets control bleeding.  Plasma helps clot blood.  Other blood products are available for specialized needs, such as hemophilia or other clotting disorders. BEFORE THE TRANSFUSION  Who gives blood for transfusions?   Healthy volunteers who are fully evaluated to make sure their blood is safe. This is blood bank blood. Transfusion therapy is the safest it has ever been in the practice of medicine. Before blood is taken from a donor, a complete history is taken to make sure that person has  no history of diseases nor engages in risky social behavior (examples are intravenous drug use or sexual activity with multiple partners). The donor's travel history is screened to minimize risk of transmitting infections, such as malaria. The donated blood is tested for signs of infectious diseases, such as HIV and hepatitis. The blood is then tested to be sure it is compatible with you in order to minimize the chance of a transfusion reaction. If you or a relative donates blood, this is often done in anticipation of surgery and is not appropriate for emergency situations. It takes many days to process the donated blood. RISKS AND COMPLICATIONS Although transfusion therapy is very safe and saves many lives, the main dangers of transfusion include:   Getting an infectious disease.  Developing a transfusion reaction. This is an allergic reaction to something in the blood you were given. Every precaution is taken to prevent this. The decision to have a blood transfusion has been considered carefully by your caregiver before blood is given. Blood is not given unless the benefits outweigh the risks. AFTER THE TRANSFUSION  Right after receiving a blood transfusion, you will usually feel much better and more energetic. This is especially true if your red blood cells have gotten low (  anemic). The transfusion raises the level of the red blood cells which carry oxygen, and this usually causes an energy increase.  The nurse administering the transfusion will monitor you carefully for complications. HOME CARE INSTRUCTIONS  No special instructions are needed after a transfusion. You may find your energy is better. Speak with your caregiver about any limitations on activity for underlying diseases you may have. SEEK MEDICAL CARE IF:   Your condition is not improving after your transfusion.  You develop redness or irritation at the intravenous (IV) site. SEEK IMMEDIATE MEDICAL CARE IF:  Any of the following  symptoms occur over the next 12 hours:  Shaking chills.  You have a temperature by mouth above 102 F (38.9 C), not controlled by medicine.  Chest, back, or muscle pain.  People around you feel you are not acting correctly or are confused.  Shortness of breath or difficulty breathing.  Dizziness and fainting.  You get a rash or develop hives.  You have a decrease in urine output.  Your urine turns a dark color or changes to pink, red, or brown. Any of the following symptoms occur over the next 10 days:  You have a temperature by mouth above 102 F (38.9 C), not controlled by medicine.  Shortness of breath.  Weakness after normal activity.  The white part of the eye turns yellow (jaundice).  You have a decrease in the amount of urine or are urinating less often.  Your urine turns a dark color or changes to pink, red, or brown. Document Released: 05/20/2000 Document Revised: 08/15/2011 Document Reviewed: 01/07/2008 [facility] Patient Information 2014 [place], [place].  _______________________________________________________________________

## 2018-04-17 ENCOUNTER — Encounter (HOSPITAL_COMMUNITY): Payer: Self-pay

## 2018-04-17 ENCOUNTER — Other Ambulatory Visit: Payer: Self-pay

## 2018-04-17 ENCOUNTER — Encounter (HOSPITAL_COMMUNITY)
Admission: RE | Admit: 2018-04-17 | Discharge: 2018-04-17 | Disposition: A | Discharge status: Home / Self Care | Payer: PPO | Source: Ambulatory Visit | Attending: Orthopedic Surgery | Admitting: Orthopedic Surgery

## 2018-04-17 DIAGNOSIS — M1711 Unilateral primary osteoarthritis, right knee: Secondary | ICD-10-CM | POA: Insufficient documentation

## 2018-04-17 DIAGNOSIS — Z01812 Encounter for preprocedural laboratory examination: Secondary | ICD-10-CM | POA: Insufficient documentation

## 2018-04-17 LAB — COMPREHENSIVE METABOLIC PANEL
ALBUMIN: 3.9 g/dL (ref 3.5–5.0)
ALK PHOS: 69 U/L (ref 38–126)
ALT: 19 U/L (ref 0–44)
AST: 20 U/L (ref 15–41)
Anion gap: 6 (ref 5–15)
BILIRUBIN TOTAL: 0.8 mg/dL (ref 0.3–1.2)
BUN: 21 mg/dL (ref 8–23)
CO2: 26 mmol/L (ref 22–32)
Calcium: 8.8 mg/dL — ABNORMAL LOW (ref 8.9–10.3)
Chloride: 107 mmol/L (ref 98–111)
Creatinine, Ser: 0.9 mg/dL (ref 0.61–1.24)
GFR calc Af Amer: >60 mL/min (ref >60)
GFR calc non Af Amer: >60 mL/min (ref >60)
GLUCOSE: 86 mg/dL (ref 70–99)
POTASSIUM: 4.8 mmol/L (ref 3.5–5.1)
Sodium: 139 mmol/L (ref 135–145)
TOTAL PROTEIN: 6.7 g/dL (ref 6.5–8.1)

## 2018-04-17 LAB — SURGICAL PCR SCREEN
MRSA, PCR: NEGATIVE
Staphylococcus aureus: NEGATIVE

## 2018-04-17 LAB — PROTIME-INR
INR: 1.07
Prothrombin Time: 13.8 seconds (ref 11.4–15.2)

## 2018-04-17 LAB — CBC
HEMATOCRIT: 44.8 % (ref 39.0–52.0)
HEMOGLOBIN: 14.2 g/dL (ref 13.0–17.0)
MCH: 29.3 pg (ref 26.0–34.0)
MCHC: 31.7 g/dL (ref 30.0–36.0)
MCV: 92.6 fL (ref 80.0–100.0)
Platelets: 203 10*3/uL (ref 150–400)
RBC: 4.84 MIL/uL (ref 4.22–5.81)
RDW: 13.7 % (ref 11.5–15.5)
WBC: 5.2 10*3/uL (ref 4.0–10.5)
nRBC: 0 % (ref 0.0–0.2)

## 2018-04-17 LAB — APTT: aPTT: 32 seconds (ref 24–36)

## 2018-04-18 DIAGNOSIS — N3941 Urge incontinence: Secondary | ICD-10-CM | POA: Diagnosis not present

## 2018-04-18 DIAGNOSIS — N401 Enlarged prostate with lower urinary tract symptoms: Secondary | ICD-10-CM | POA: Diagnosis not present

## 2018-04-18 DIAGNOSIS — R972 Elevated prostate specific antigen [PSA]: Secondary | ICD-10-CM | POA: Diagnosis not present

## 2018-04-18 DIAGNOSIS — N5201 Erectile dysfunction due to arterial insufficiency: Secondary | ICD-10-CM | POA: Diagnosis not present

## 2018-04-23 ENCOUNTER — Ambulatory Visit (HOSPITAL_COMMUNITY)
Admission: RE | Admit: 2018-04-23 | Discharge: 2018-04-23 | Disposition: A | Discharge status: Home / Self Care | Payer: PPO | Source: Ambulatory Visit | Attending: Orthopedic Surgery | Admitting: Orthopedic Surgery

## 2018-04-23 ENCOUNTER — Inpatient Hospital Stay (HOSPITAL_COMMUNITY): Payer: PPO | Admitting: Anesthesiology

## 2018-04-23 ENCOUNTER — Other Ambulatory Visit: Payer: Self-pay

## 2018-04-23 ENCOUNTER — Encounter (HOSPITAL_COMMUNITY)
Admission: RE | Disposition: A | Discharge status: Home / Self Care | Payer: Self-pay | Source: Ambulatory Visit | Attending: Orthopedic Surgery

## 2018-04-23 ENCOUNTER — Encounter (HOSPITAL_COMMUNITY): Payer: Self-pay | Admitting: *Deleted

## 2018-04-23 DIAGNOSIS — E785 Hyperlipidemia, unspecified: Secondary | ICD-10-CM | POA: Diagnosis not present

## 2018-04-23 DIAGNOSIS — H409 Unspecified glaucoma: Secondary | ICD-10-CM | POA: Diagnosis not present

## 2018-04-23 DIAGNOSIS — M1711 Unilateral primary osteoarthritis, right knee: Secondary | ICD-10-CM | POA: Diagnosis not present

## 2018-04-23 DIAGNOSIS — Z79899 Other long term (current) drug therapy: Secondary | ICD-10-CM | POA: Insufficient documentation

## 2018-04-23 DIAGNOSIS — M170 Bilateral primary osteoarthritis of knee: Secondary | ICD-10-CM | POA: Diagnosis not present

## 2018-04-23 DIAGNOSIS — I4891 Unspecified atrial fibrillation: Secondary | ICD-10-CM | POA: Diagnosis not present

## 2018-04-23 DIAGNOSIS — K219 Gastro-esophageal reflux disease without esophagitis: Secondary | ICD-10-CM | POA: Diagnosis not present

## 2018-04-23 DIAGNOSIS — Z888 Allergy status to other drugs, medicaments and biological substances status: Secondary | ICD-10-CM | POA: Diagnosis not present

## 2018-04-23 DIAGNOSIS — M1712 Unilateral primary osteoarthritis, left knee: Secondary | ICD-10-CM | POA: Diagnosis not present

## 2018-04-23 DIAGNOSIS — Z538 Procedure and treatment not carried out for other reasons: Secondary | ICD-10-CM | POA: Insufficient documentation

## 2018-04-23 LAB — TYPE AND SCREEN
ABO/RH(D): A POS
ANTIBODY SCREEN: NEGATIVE

## 2018-04-23 SURGERY — ARTHROPLASTY, KNEE, TOTAL
Anesthesia: Choice | Site: Knee | Laterality: Right

## 2018-04-23 MED ORDER — MIDAZOLAM HCL 2 MG/2ML IJ SOLN
1.0000 mg | INTRAMUSCULAR | Status: DC
  Filled 2018-04-23 (×2): qty 2

## 2018-04-23 MED ORDER — BUPIVACAINE LIPOSOME 1.3 % IJ SUSP
20.0000 mL | Freq: Once | INTRAMUSCULAR | Status: DC
  Filled 2018-04-23: qty 20

## 2018-04-23 MED ORDER — LACTATED RINGERS IV SOLN
INTRAVENOUS | Status: DC
  Administered 2018-04-23: 09:00:00 via INTRAVENOUS

## 2018-04-23 MED ORDER — ONDANSETRON HCL 4 MG/2ML IJ SOLN
INTRAMUSCULAR | Status: AC
  Filled 2018-04-23: qty 2

## 2018-04-23 MED ORDER — GABAPENTIN 300 MG PO CAPS
300.0000 mg | ORAL_CAPSULE | Freq: Once | ORAL | Status: AC
  Administered 2018-04-23: 300 mg via ORAL
  Filled 2018-04-23: qty 1

## 2018-04-23 MED ORDER — FENTANYL CITRATE (PF) 100 MCG/2ML IJ SOLN
INTRAMUSCULAR | Status: AC
  Filled 2018-04-23: qty 2

## 2018-04-23 MED ORDER — DEXAMETHASONE SODIUM PHOSPHATE 10 MG/ML IJ SOLN
INTRAMUSCULAR | Status: AC
  Filled 2018-04-23: qty 1

## 2018-04-23 MED ORDER — DEXAMETHASONE SODIUM PHOSPHATE 10 MG/ML IJ SOLN: 8.0000 mg | Freq: Once | INTRAMUSCULAR | Status: DC

## 2018-04-23 MED ORDER — TRANEXAMIC ACID-NACL 1000-0.7 MG/100ML-% IV SOLN
1000.0000 mg | INTRAVENOUS | Status: DC
  Filled 2018-04-23 (×2): qty 100

## 2018-04-23 MED ORDER — ACETAMINOPHEN 10 MG/ML IV SOLN
1000.0000 mg | Freq: Four times a day (QID) | INTRAVENOUS | Status: DC
  Filled 2018-04-23 (×2): qty 100

## 2018-04-23 MED ORDER — FENTANYL CITRATE (PF) 100 MCG/2ML IJ SOLN
50.0000 ug | INTRAMUSCULAR | Status: DC
  Filled 2018-04-23 (×2): qty 2

## 2018-04-23 MED ORDER — VANCOMYCIN HCL IN DEXTROSE 1-5 GM/200ML-% IV SOLN
1000.0000 mg | Freq: Once | INTRAVENOUS | Status: DC
  Filled 2018-04-23: qty 200

## 2018-04-23 MED ORDER — MIDAZOLAM HCL 2 MG/2ML IJ SOLN
INTRAMUSCULAR | Status: AC
  Filled 2018-04-23: qty 2

## 2018-04-23 MED ORDER — CHLORHEXIDINE GLUCONATE 4 % EX LIQD
60.0000 mL | Freq: Once | CUTANEOUS | Status: AC
  Administered 2018-04-23: 4 via TOPICAL

## 2018-04-23 MED ORDER — PROPOFOL 10 MG/ML IV BOLUS
INTRAVENOUS | Status: AC
  Filled 2018-04-23: qty 40

## 2018-04-23 NOTE — Interval H&P Note (Signed)
History and Physical Interval Note:  04/23/2018 8:33 AM  Joshua Moran  has presented today for surgery, with the diagnosis of right knee osteoarthritis  The various methods of treatment have been discussed with the patient and family. After consideration of risks, benefits and other options for treatment, the patient has consented to  Procedure(s) with comments: RIGHT TOTAL KNEE ARTHROPLASTY (Right) - 27min as a surgical intervention .  The patient's history has been reviewed, patient examined, no change in status, stable for surgery.  I have reviewed the patient's chart and labs.  Questions were answered to the patient's satisfaction.     Pilar Plate Mckaela Howley

## 2018-04-23 NOTE — Anesthesia Preprocedure Evaluation (Signed)
Anesthesia Evaluation  Patient identified by MRN, date of birth, ID band Patient awake    Reviewed: Allergy & Precautions, NPO status , Patient's Chart, lab work & pertinent test results  History of Anesthesia Complications Negative for: history of anesthetic complications  Airway Mallampati: III  TM Distance: >3 FB Neck ROM: Full    Dental  (+) Teeth Intact   Pulmonary shortness of breath,    breath sounds clear to auscultation       Cardiovascular  Rhythm:Irregular - Systolic murmurs    Neuro/Psych negative neurological ROS     GI/Hepatic   Endo/Other    Renal/GU      Musculoskeletal  (+) Arthritis ,   Abdominal   Peds  Hematology   Anesthesia Other Findings   Reproductive/Obstetrics                             Anesthesia Physical Anesthesia Plan  ASA: III  Anesthesia Plan: MAC, Regional and Spinal   Post-op Pain Management:    Induction: Intravenous  PONV Risk Score and Plan: 1 and Treatment may vary due to age or medical condition  Airway Management Planned: Nasal Cannula  Additional Equipment: None  Intra-op Plan:   Post-operative Plan:   Informed Consent: I have reviewed the patients History and Physical, chart, labs and discussed the procedure including the risks, benefits and alternatives for the proposed anesthesia with the patient or authorized representative who has indicated his/her understanding and acceptance.   Dental advisory given  Plan Discussed with: CRNA and Surgeon  Anesthesia Plan Comments: (Patient presented with irregular rhythm that was diagnosed as afib on EKG. Patient denies previous history of afib. Procedure cancelled and patient discharged home with HR 70's and no symptoms. Dr Kennon Holter offic called and patient scheduled for appointment 11/19 at 0915. )        Anesthesia Quick Evaluation

## 2018-04-23 NOTE — Progress Notes (Signed)
Irregular heart rhythm noted prior to nerve block. Dr. Ermalene Postin informed. EKG ordered. EKG showed Atrial Fibrillation. Surgery cancelled and nerve block not performed. Family notified. Will follow up with Dr. Wynelle Link and cardiologist.

## 2018-04-23 NOTE — Discharge Instructions (Signed)
° °Dr. Frank Aluisio °Total Joint Specialist °[facility] °[address] °[place], [place] [address] °[phone] ° °TOTAL KNEE REPLACEMENT POSTOPERATIVE DIRECTIONS ° °Knee Rehabilitation, Guidelines Following Surgery  °Results after knee surgery are often greatly improved when you follow the exercise, range of motion and muscle strengthening exercises prescribed by your doctor. Safety measures are also important to protect the knee from further injury. Any time any of these exercises cause you to have increased pain or swelling in your knee joint, decrease the amount until you are comfortable again and slowly increase them. If you have problems or questions, call your caregiver or physical therapist for advice.  ° °HOME CARE INSTRUCTIONS  °• Remove items at home which could result in a fall. This includes throw rugs or furniture in walking pathways.  °· ICE to the affected knee every three hours for 30 minutes at a time and then as needed for pain and swelling.  Continue to use ice on the knee for pain and swelling from surgery. You may notice swelling that will progress down to the foot and ankle.  This is normal after surgery.  Elevate the leg when you are not up walking on it.   °· Continue to use the breathing machine which will help keep your temperature down.  It is common for your temperature to cycle up and down following surgery, especially at night when you are not up moving around and exerting yourself.  The breathing machine keeps your lungs expanded and your temperature down. °· Do not place pillow under knee, focus on keeping the knee straight while resting ° °DIET °You may resume your previous home diet once your are discharged from the hospital. ° °DRESSING / WOUND CARE / SHOWERING °You may shower 3 days after surgery, but keep the wounds dry during showering.  You may use an occlusive plastic wrap (Press'n Seal for example), NO SOAKING/SUBMERGING IN THE BATHTUB.  If the bandage  gets wet, change with a clean dry gauze.  If the incision gets wet, pat the wound dry with a clean towel. °You may start showering once you are discharged home but do not submerge the incision under water. Just pat the incision dry and apply a dry gauze dressing on daily. °Change the surgical dressing daily and reapply a dry dressing each time. ° °ACTIVITY °Walk with your walker as instructed. °Use walker as long as suggested by your caregivers. °Avoid periods of inactivity such as sitting longer than an hour when not asleep. This helps prevent blood clots.  °You may resume a sexual relationship in one month or when given the OK by your doctor.  °You may return to work once you are cleared by your doctor.  °Do not drive a car for 6 weeks or until released by you surgeon.  °Do not drive while taking narcotics. ° °WEIGHT BEARING °Weight bearing as tolerated with assist device (walker, cane, etc) as directed, use it as long as suggested by your surgeon or therapist, typically at least 4-6 weeks. ° °POSTOPERATIVE CONSTIPATION PROTOCOL °Constipation - defined medically as fewer than three stools per week and severe constipation as less than one stool per week. ° °One of the most common issues patients have following surgery is constipation.  Even if you have a regular bowel pattern at home, your normal regimen is likely to be disrupted due to multiple reasons following surgery.  Combination of anesthesia, postoperative narcotics, change in appetite and fluid intake all can affect your bowels.    In order to avoid complications following surgery, here are some recommendations in order to help you during your recovery period. ° °Colace (docusate) - Pick up an over-the-counter form of Colace or another stool softener and take twice a day as long as you are requiring postoperative pain medications.  Take with a full glass of water daily.  If you experience loose stools or diarrhea, hold the colace until you stool forms back  up.  If your symptoms do not get better within 1 week or if they get worse, check with your doctor. ° °Dulcolax (bisacodyl) - Pick up over-the-counter and take as directed by the product packaging as needed to assist with the movement of your bowels.  Take with a full glass of water.  Use this product as needed if not relieved by Colace only.  ° °MiraLax (polyethylene glycol) - Pick up over-the-counter to have on hand.  MiraLax is a solution that will increase the amount of water in your bowels to assist with bowel movements.  Take as directed and can mix with a glass of water, juice, soda, coffee, or tea.  Take if you go more than two days without a movement. °Do not use MiraLax more than once per day. Call your doctor if you are still constipated or irregular after using this medication for 7 days in a row. ° °If you continue to have problems with postoperative constipation, please contact the office for further assistance and recommendations.  If you experience "the worst abdominal pain ever" or develop nausea or vomiting, please contact the office immediatly for further recommendations for treatment. ° °ITCHING ° If you experience itching with your medications, try taking only a single pain pill, or even half a pain pill at a time.  You can also use Benadryl over the counter for itching or also to help with sleep.  ° °TED HOSE STOCKINGS °Wear the elastic stockings on both legs for three weeks following surgery during the day but you may remove then at night for sleeping. ° °MEDICATIONS °See your medication summary on the “After Visit Summary” that the nursing staff will review with you prior to discharge.  You may have some home medications which will be placed on hold until you complete the course of blood thinner medication.  It is important for you to complete the blood thinner medication as prescribed by your surgeon.  Continue your approved medications as instructed at time of discharge. ° °PRECAUTIONS °If  you experience chest pain or shortness of breath - call 911 immediately for transfer to the hospital emergency department.  °If you develop a fever greater that 101 F, purulent drainage from wound, increased redness or drainage from wound, foul odor from the wound/dressing, or calf pain - CONTACT YOUR SURGEON.   °                                                °FOLLOW-UP APPOINTMENTS °Make sure you keep all of your appointments after your operation with your surgeon and caregivers. You should call the office at the above phone number and make an appointment for approximately two weeks after the date of your surgery or on the date instructed by your surgeon outlined in the "After Visit Summary". ° ° °RANGE OF MOTION AND STRENGTHENING EXERCISES  °Rehabilitation of the knee is important following a knee injury or   an operation. After just a few days of immobilization, the muscles of the thigh which control the knee become weakened and shrink (atrophy). Knee exercises are designed to build up the tone and strength of the thigh muscles and to improve knee motion. Often times heat used for twenty to thirty minutes before working out will loosen up your tissues and help with improving the range of motion but do not use heat for the first two weeks following surgery. These exercises can be done on a training (exercise) mat, on the floor, on a table or on a bed. Use what ever works the best and is most comfortable for you Knee exercises include:  °• Leg Lifts - While your knee is still immobilized in a splint or cast, you can do straight leg raises. Lift the leg to 60 degrees, hold for 3 sec, and slowly lower the leg. Repeat 10-20 times 2-3 times daily. Perform this exercise against resistance later as your knee gets better.  °• Quad and Hamstring Sets - Tighten up the muscle on the front of the thigh (Quad) and hold for 5-10 sec. Repeat this 10-20 times hourly. Hamstring sets are done by pushing the foot backward against an  object and holding for 5-10 sec. Repeat as with quad sets.  °· Leg Slides: Lying on your back, slowly slide your foot toward your buttocks, bending your knee up off the floor (only go as far as is comfortable). Then slowly slide your foot back down until your leg is flat on the floor again. °· Angel Wings: Lying on your back spread your legs to the side as far apart as you can without causing discomfort.  °A rehabilitation program following serious knee injuries can speed recovery and prevent re-injury in the future due to weakened muscles. Contact your doctor or a physical therapist for more information on knee rehabilitation.  ° °IF YOU ARE TRANSFERRED TO A SKILLED REHAB FACILITY °If the patient is transferred to a skilled rehab facility following release from the hospital, a list of the current medications will be sent to the facility for the patient to continue.  When discharged from the skilled rehab facility, please have the facility set up the patient's Home Health Physical Therapy prior to being released. Also, the skilled facility will be responsible for providing the patient with their medications at time of release from the facility to include their pain medication, the muscle relaxants, and their blood thinner medication. If the patient is still at the rehab facility at time of the two week follow up appointment, the skilled rehab facility will also need to assist the patient in arranging follow up appointment in our office and any transportation needs. ° °MAKE SURE YOU:  °• Understand these instructions.  °• Get help right away if you are not doing well or get worse.  ° ° °Pick up stool softner and laxative for home use following surgery while on pain medications. °Do not submerge incision under water. °Please use good hand washing techniques while changing dressing each day. °May shower starting three days after surgery. °Please use a clean towel to pat the incision dry following showers. °Continue to  use ice for pain and swelling after surgery. °Do not use any lotions or creams on the incision until instructed by your surgeon. ° °

## 2018-04-24 ENCOUNTER — Encounter: Payer: Self-pay | Admitting: Cardiovascular Disease

## 2018-04-24 ENCOUNTER — Ambulatory Visit (INDEPENDENT_AMBULATORY_CARE_PROVIDER_SITE_OTHER): Payer: PPO | Admitting: Cardiovascular Disease

## 2018-04-24 DIAGNOSIS — I480 Paroxysmal atrial fibrillation: Secondary | ICD-10-CM | POA: Diagnosis not present

## 2018-04-24 DIAGNOSIS — I208 Other forms of angina pectoris: Secondary | ICD-10-CM

## 2018-04-24 DIAGNOSIS — R079 Chest pain, unspecified: Secondary | ICD-10-CM | POA: Insufficient documentation

## 2018-04-24 NOTE — Assessment & Plan Note (Signed)
Episode of chest pain last night.  He did have a negative Myoview performed 05/20/2015 in follow-up of coronary calcification seen on CTA.  I am going to repeat a pharmacologic Myoview stress test to risk stratify him and cleared him for his upcoming knee surgery

## 2018-04-24 NOTE — Assessment & Plan Note (Signed)
Joshua Moran was being seen for total knee replacement yesterday by anesthesia and was found to be in A. fib.  He is unaware that he is in this.  His surgery was postponed and is here for further evaluation and clearance.  He does appear to be in A. fib today. This patients CHA2DS2-VASc Score and unadjusted Ischemic Stroke Rate (% per year) is equal to 0.6 % stroke rate/year from a score of 1.  I am not going to start him on an oral anticoagulant.  He is already on low-dose aspirin.  Above score calculated as 1 point each if present [CHF, HTN, DM, Vascular=MI/PAD/Aortic Plaque, Age if 65-74, or Male] Above score calculated as 2 points each if present [Age > 75, or Stroke/TIA/TE]

## 2018-04-24 NOTE — Progress Notes (Signed)
04/24/2018 Joshua Moran   02-04-1946  [id number]  Primary Physician Lawerance Cruel, MD Primary Cardiologist: Lorretta Harp MD Lupe Carney, [place]  HPI:  Joshua Moran is a 72 y.o.  moderately overweight married Caucasian male father of 3 children, grandfather of 3 grandchildren referred by Dr. Melinda Crutch for cardiovascular evaluation because of coronary calcium found on chest CT done for other reasons.  Is accompanied by his wife today.  I last saw him in the office 12/12/2017.  He is retired Teacher, English as a foreign language for [employer]. His only cardiac risk factor is mild hyperlipidemia with statin intolerance. There is no family history. He has never smoked. He has never had a heart attack or stroke. He denies chest pain but does have mild dyspnea on exertion probably related to poor exercise tolerance from inactivity. He was seen at the hospital during a UTI and has apparently had asymmetric upper extremity blood pressures. A CT scan done of his chest suggested that he had calcium in his left main and RCA.  A Myoview stress test performed 05/20/2015 was normal and recent 2D echo performed 09/27/2017 showed normal LV systolic function, grade 1 diastolic dysfunction and mild pulmonary hypertension.  He is somewhat deconditioned.  He requires bilateral knee replacement left first by Dr. Reynaldo Minium which he will be cleared for at low risk.  He also has elevated LDL at 153 measured on 08/08/2017 but states that he is statin intolerant.  He was seen by anesthesiology for preop right total knee replacement yesterday was found to be in A. fib.  He is unaware of this.  His CHA2DsVASC core is 1.  He is already on a baby aspirin.  I am going to begin him on low-dose diltiazem, obtain a pharmacologic Myoview stress test to risk stratify him.  Current Meds  Medication Sig  . acetaminophen (TYLENOL) 500 MG tablet Take 1,000 mg by mouth every 6 (six) hours as needed for mild pain, moderate pain, fever or headache.   Marland Kitchen aspirin EC 81 MG tablet Take 81 mg by mouth daily.  . brimonidine (ALPHAGAN) 0.15 % ophthalmic solution Place 1 drop into the right eye 2 (two) times daily.  . cetirizine (ZYRTEC) 10 MG tablet Take 10 mg by mouth every evening.  . Cholecalciferol (VITAMIN D3) 2000 units TABS Take 2,000 Units by mouth every evening.   . ferrous sulfate 325 (65 FE) MG tablet Take 325 mg by mouth daily with breakfast.  . finasteride (PROSCAR) 5 MG tablet Take 5 mg by mouth daily.  . fluticasone (FLONASE) 50 MCG/ACT nasal spray Place 2 sprays into both nostrils 2 (two) times daily.   Marland Kitchen gabapentin (NEURONTIN) 300 MG capsule Take 1 capsule (300 mg total) by mouth 3 (three) times daily. Gabapentin 300 mg Protocol Take a 300 mg capsule three times a day for two weeks following surgery. Then take a 300 mg capsule two times a day for two weeks.  Then take a 300 mg capsule once a day for two weeks.  Then discontinue the Gabapentin.  . Glucosamine-Chondroitin (COSAMIN DS PO) Take 1 tablet by mouth 2 (two) times daily.  . meloxicam (MOBIC) 15 MG tablet Take 15 mg by mouth daily.  . methocarbamol (ROBAXIN) 500 MG tablet Take 1 tablet (500 mg total) by mouth every 6 (six) hours as needed for muscle spasms.  . Multiple Vitamin (MULTIVITAMIN WITH MINERALS) TABS tablet Take 1 tablet by mouth daily.  Marland Kitchen oxyCODONE (OXY IR/ROXICODONE) 5 MG immediate  release tablet Take 1-2 tablets (5-10 mg total) by mouth every 6 (six) hours as needed for moderate pain (pain score 4-6).  . rosuvastatin (CRESTOR) 5 MG tablet Take 1 tablet (5 mg total) by mouth daily at 6 PM. (Patient taking differently: Take 5 mg by mouth every Monday, Wednesday, and Friday. )  . sildenafil (REVATIO) 20 MG tablet Take 40-100 mg by mouth daily as needed (erectile dysfunction.).   Marland Kitchen tamsulosin (FLOMAX) 0.4 MG CAPS capsule Take 0.4 mg by mouth daily after supper.  . traMADol (ULTRAM) 50 MG tablet Take 1-2 tablets (50-100 mg total) by mouth every 6 (six) hours as  needed for moderate pain.     Allergies  Allergen Reactions  . Penicillins Other (See Comments)    Very flushed  Has patient had a PCN reaction causing immediate rash, facial/tongue/throat swelling, SOB or lightheadedness with hypotension: No Has patient had a PCN reaction causing severe rash involving mucus membranes or skin necrosis: No Has patient had a PCN reaction that required hospitalization No Has patient had a PCN reaction occurring within the last 10 years: No If all of the above answers are "NO", then may proceed with Cephalosporin use.   . Statins Other (See Comments)    Pt c/o muscle aches with use of statins.    Social History   Socioeconomic History  . Marital status: Married    Spouse name: Not on file  . Number of children: Not on file  . Years of education: Not on file  . Highest education level: Not on file  Occupational History  . Not on file  Social Needs  . Financial resource strain: Not on file  . Food insecurity:    Worry: Not on file    Inability: Not on file  . Transportation needs:    Medical: Not on file    Non-medical: Not on file  Tobacco Use  . Smoking status: Never Smoker  . Smokeless tobacco: Never Used  Substance and Sexual Activity  . Alcohol use: Yes    Comment: Rare  . Drug use: No  . Sexual activity: Not on file  Lifestyle  . Physical activity:    Days per week: Not on file    Minutes per session: Not on file  . Stress: Not on file  Relationships  . Social connections:    Talks on phone: Not on file    Gets together: Not on file    Attends religious service: Not on file    Active member of club or organization: Not on file    Attends meetings of clubs or organizations: Not on file    Relationship status: Not on file  . Intimate partner violence:    Fear of current or ex partner: Not on file    Emotionally abused: Not on file    Physically abused: Not on file    Forced sexual activity: Not on file  Other Topics Concern    . Not on file  Social History Narrative  . Not on file     Review of Systems: General: negative for chills, fever, night sweats or weight changes.  Cardiovascular: negative for chest pain, dyspnea on exertion, edema, orthopnea, palpitations, paroxysmal nocturnal dyspnea or shortness of breath Dermatological: negative for rash Respiratory: negative for cough or wheezing Urologic: negative for hematuria Abdominal: negative for nausea, vomiting, diarrhea, bright red blood per rectum, melena, or hematemesis Neurologic: negative for visual changes, syncope, or dizziness All other systems reviewed and are otherwise  negative except as noted above.    Blood pressure 122/78, pulse (!) 103, height 6' (1.829 m), weight 282 lb 3.2 oz (128 kg), SpO2 96 %.  General appearance: alert and no distress Neck: no adenopathy, no carotid bruit, no JVD, supple, symmetrical, trachea midline and thyroid not enlarged, symmetric, no tenderness/mass/nodules Lungs: clear to auscultation bilaterally Heart: irregularly irregular rhythm Extremities: 1-2+ bilateral lower extremity edema Pulses: 2+ and symmetric Skin: Skin color, texture, turgor normal. No rashes or lesions Neurologic: Alert and oriented X 3, normal strength and tone. Normal symmetric reflexes. Normal coordination and gait  EKG showed fibrillation with a ventricular response of 83.  I personally reviewed this EKG.  ASSESSMENT AND PLAN:   Paroxysmal atrial fibrillation [facility]) Mr. Rosenberg was being seen for total knee replacement yesterday by anesthesia and was found to be in A. fib.  He is unaware that he is in this.  His surgery was postponed and is here for further evaluation and clearance.  He does appear to be in A. fib today. This patients CHA2DS2-VASc Score and unadjusted Ischemic Stroke Rate (% per year) is equal to 0.6 % stroke rate/year from a score of 1.  I am not going to start him on an oral anticoagulant.  He is already on low-dose  aspirin.  Above score calculated as 1 point each if present [CHF, HTN, DM, Vascular=MI/PAD/Aortic Plaque, Age if 65-74, or Male] Above score calculated as 2 points each if present [Age > 75, or Stroke/TIA/TE]   Chest pain Episode of chest pain last night.  He did have a negative Myoview performed 05/20/2015 in follow-up of coronary calcification seen on CTA.  I am going to repeat a pharmacologic Myoview stress test to risk stratify him and cleared him for his upcoming knee surgery      Lorretta Harp MD [facility], [facility] 04/24/2018 10:02 AM

## 2018-04-24 NOTE — Patient Instructions (Addendum)
Medication Instructions:  Your physician recommends that you continue on your current medications as directed. Please refer to the Current Medication list given to you today.  If you need a refill on your cardiac medications before your next appointment, please call your pharmacy.   Lab work: NONE If you have labs (blood work) drawn today and your tests are completely normal, you will receive your results only by: Marland Kitchen MyChart Message (if you have MyChart) OR . A paper copy in the mail If you have any lab test that is abnormal or we need to change your treatment, we will call you to review the results.   Testing/Procedures: Your physician has requested that you have a lexiscan myoview. For further information please visit [email or web address]. Please follow instruction sheet, as given.   Follow-Up: At [facility], you and your health needs are our priority.  As part of our continuing mission to provide you with exceptional heart care, we have created designated Provider Care Teams.  These Care Teams include your primary Cardiologist (physician) and Advanced Practice Providers (APPs -  Physician Assistants and Nurse Practitioners) who all work together to provide you with the care you need, when you need it. We request that you follow-up in: 3 months  with an extender and in 6 months with Dr Gwenlyn Found Please call our office 2 months in advance to schedule this appointment.  You may see Quay Burow, MD or one of the following Advanced Practice Providers on your designated Care Team:   Kerin Ransom, PA-C Roby Lofts, [place] . Sande Rives, PA-C  Any Other Special Instructions Will Be Listed Below (If Applicable).  Cardiac clearance pending results of nuclear study.

## 2018-04-26 NOTE — Addendum Note (Signed)
Addended by: Zebedee Iba on: 04/26/2018 02:55 PM   Modules accepted: Orders

## 2018-05-10 ENCOUNTER — Telehealth (HOSPITAL_COMMUNITY): Payer: Self-pay

## 2018-05-10 NOTE — Telephone Encounter (Signed)
Encounter complete. 

## 2018-05-15 ENCOUNTER — Ambulatory Visit (HOSPITAL_COMMUNITY)
Admission: RE | Admit: 2018-05-15 | Discharge: 2018-05-15 | Disposition: A | Discharge status: Home / Self Care | Payer: PPO | Source: Ambulatory Visit | Attending: Cardiovascular Disease | Admitting: Cardiovascular Disease

## 2018-05-15 DIAGNOSIS — I480 Paroxysmal atrial fibrillation: Secondary | ICD-10-CM | POA: Insufficient documentation

## 2018-05-15 DIAGNOSIS — I208 Other forms of angina pectoris: Secondary | ICD-10-CM | POA: Diagnosis not present

## 2018-05-16 ENCOUNTER — Ambulatory Visit (HOSPITAL_COMMUNITY)
Admission: RE | Admit: 2018-05-16 | Discharge: 2018-05-16 | Disposition: A | Discharge status: Home / Self Care | Payer: PPO | Source: Ambulatory Visit | Attending: Cardiovascular Disease | Admitting: Cardiovascular Disease

## 2018-05-16 LAB — MYOCARDIAL PERFUSION IMAGING
CHL CUP NUCLEAR SDS: 2
CHL CUP NUCLEAR SSS: 6
LV dias vol: 154 mL (ref 62–150)
LV sys vol: 87 mL
Peak HR: 109 {beats}/min
Rest HR: 85 {beats}/min
SRS: 4
TID: 1.02

## 2018-05-16 MED ORDER — TECHNETIUM TC 99M TETROFOSMIN IV KIT
29.1000 | PACK | Freq: Once | INTRAVENOUS | Status: AC | PRN
  Administered 2018-05-16: 29.1 via INTRAVENOUS

## 2018-05-16 MED ORDER — REGADENOSON 0.4 MG/5ML IV SOLN
0.4000 mg | Freq: Once | INTRAVENOUS | Status: AC
  Administered 2018-05-15: 0.4 mg via INTRAVENOUS

## 2018-05-16 MED ORDER — TECHNETIUM TC 99M TETROFOSMIN IV KIT
27.2000 | PACK | Freq: Once | INTRAVENOUS | Status: AC | PRN
  Administered 2018-05-15: 27.2 via INTRAVENOUS

## 2018-06-13 NOTE — Patient Instructions (Addendum)
Joshua Moran.  06/13/2018   Your procedure is scheduled on:  06-25-08   Report to [facility] Main  Entrance              Report to admitting at      1220  PM    Call this number if you have problems the morning of surgery [phone]    Remember: Do not eat food:After Midnight. YOU MAY HAVE CLEAR LIQUIDS UNTIL 0850 AM THEN NOTHING BY MOUTH    CLEAR LIQUID DIET   Foods Allowed                                                                     Foods Excluded  Coffee and tea, regular and decaf                             liquids that you cannot  Plain Jell-O in any flavor                                             see through such as: Fruit ices (not with fruit pulp)                                     milk, soups, orange juice  Iced Popsicles                                    All solid food Carbonated beverages, regular and diet                                    Cranberry, grape and apple juices Sports drinks like Gatorade Lightly seasoned clear broth or consume(fat free) Sugar, honey syrup  __________________________________________________________________    BRUSH YOUR TEETH MORNING OF SURGERY AND RINSE YOUR MOUTH OUT, NO CHEWING GUM CANDY OR MINTS.     Take these medicines the morning of surgery with A SIP OF WATER: crestor, finesteride, zyrtec, eyedrops as usual                                You may not have any metal on your body including hair pins and              piercings  Do not wear jewelry,lotions, powders or perfumes, deodorant                  Men may shave face and neck.   Do not bring valuables to the hospital. [place].  Contacts, dentures or bridgework may not be worn into surgery.  Leave suitcase in the car.  After surgery it may be brought to your room.      Special Instructions: N/A              Please read over the following fact sheets you were  given: _____________________________________________________________________           [facility] - Preparing for Surgery Before surgery, you can play an important role.  Because skin is not sterile, your skin needs to be as free of germs as possible.  You can reduce the number of germs on your skin by washing with CHG (chlorahexidine gluconate) soap before surgery.  CHG is an antiseptic cleaner which kills germs and bonds with the skin to continue killing germs even after washing. Please DO NOT use if you have an allergy to CHG or antibacterial soaps.  If your skin becomes reddened/irritated stop using the CHG and inform your nurse when you arrive at Short Stay. Do not shave (including legs and underarms) for at least 48 hours prior to the first CHG shower.  You may shave your face/neck. Please follow these instructions carefully:  1.  Shower with CHG Soap the night before surgery and the  morning of Surgery.  2.  If you choose to wash your hair, wash your hair first as usual with your  normal  shampoo.  3.  After you shampoo, rinse your hair and body thoroughly to remove the  shampoo.                           4.  Use CHG as you would any other liquid soap.  You can apply chg directly  to the skin and wash                       Gently with a scrungie or clean washcloth.  5.  Apply the CHG Soap to your body ONLY FROM THE NECK DOWN.   Do not use on face/ open                           Wound or open sores. Avoid contact with eyes, ears mouth and genitals (private parts).                       Wash face,  Genitals (private parts) with your normal soap.             6.  Wash thoroughly, paying special attention to the area where your surgery  will be performed.  7.  Thoroughly rinse your body with warm water from the neck down.  8.  DO NOT shower/wash with your normal soap after using and rinsing off  the CHG Soap.                9.  Pat yourself dry with a clean towel.            10.  Wear clean  pajamas.            11.  Place clean sheets on your bed the night of your first shower and do not  sleep with pets. Day of Surgery : Do not apply any lotions/deodorants the morning of surgery.  Please wear clean clothes to the hospital/surgery center.  FAILURE TO FOLLOW THESE INSTRUCTIONS MAY RESULT IN THE CANCELLATION OF YOUR SURGERY PATIENT SIGNATURE_________________________________  NURSE SIGNATURE__________________________________  ________________________________________________________________________  WHAT IS A BLOOD TRANSFUSION?  Blood Transfusion Information  A transfusion is the replacement of blood or some of its parts. Blood is made up of multiple cells which provide different functions.  Red blood cells carry oxygen and are used for blood loss replacement.  White blood cells fight against infection.  Platelets control bleeding.  Plasma helps clot blood.  Other blood products are available for specialized needs, such as hemophilia or other clotting disorders. BEFORE THE TRANSFUSION  Who gives blood for transfusions?   Healthy volunteers who are fully evaluated to make sure their blood is safe. This is blood bank blood. Transfusion therapy is the safest it has ever been in the practice of medicine. Before blood is taken from a donor, a complete history is taken to make sure that person has no history of diseases nor engages in risky social behavior (examples are intravenous drug use or sexual activity with multiple partners). The donor's travel history is screened to minimize risk of transmitting infections, such as malaria. The donated blood is tested for signs of infectious diseases, such as HIV and hepatitis. The blood is then tested to be sure it is compatible with you in order to minimize the chance of a transfusion reaction. If you or a relative donates blood, this is often done in anticipation of surgery and is not appropriate for emergency situations. It takes many days  to process the donated blood. RISKS AND COMPLICATIONS Although transfusion therapy is very safe and saves many lives, the main dangers of transfusion include:   Getting an infectious disease.  Developing a transfusion reaction. This is an allergic reaction to something in the blood you were given. Every precaution is taken to prevent this. The decision to have a blood transfusion has been considered carefully by your caregiver before blood is given. Blood is not given unless the benefits outweigh the risks. AFTER THE TRANSFUSION  Right after receiving a blood transfusion, you will usually feel much better and more energetic. This is especially true if your red blood cells have gotten low (anemic). The transfusion raises the level of the red blood cells which carry oxygen, and this usually causes an energy increase.  The nurse administering the transfusion will monitor you carefully for complications. HOME CARE INSTRUCTIONS  No special instructions are needed after a transfusion. You may find your energy is better. Speak with your caregiver about any limitations on activity for underlying diseases you may have. SEEK MEDICAL CARE IF:   Your condition is not improving after your transfusion.  You develop redness or irritation at the intravenous (IV) site. SEEK IMMEDIATE MEDICAL CARE IF:  Any of the following symptoms occur over the next 12 hours:  Shaking chills.  You have a temperature by mouth above 102 F (38.9 C), not controlled by medicine.  Chest, back, or muscle pain.  People around you feel you are not acting correctly or are confused.  Shortness of breath or difficulty breathing.  Dizziness and fainting.  You get a rash or develop hives.  You have a decrease in urine output.  Your urine turns a dark color or changes to pink, red, or brown. Any of the following symptoms occur over the next 10 days:  You have a temperature by mouth above 102 F (38.9 C), not controlled  by medicine.  Shortness of breath.  Weakness after normal activity.  The white part of the eye turns yellow (jaundice).  You have a decrease in the amount of urine or are urinating less often.  Your  urine turns a dark color or changes to pink, red, or brown. Document Released: 05/20/2000 Document Revised: 08/15/2011 Document Reviewed: 01/07/2008 ExitCare Patient Information 2014 [place].  _______________________________________________________________________  Incentive Spirometer  An incentive spirometer is a tool that can help keep your lungs clear and active. This tool measures how well you are filling your lungs with each breath. Taking long deep breaths may help reverse or decrease the chance of developing breathing (pulmonary) problems (especially infection) following:  A long period of time when you are unable to move or be active. BEFORE THE PROCEDURE   If the spirometer includes an indicator to show your best effort, your nurse or respiratory therapist will set it to a desired goal.  If possible, sit up straight or lean slightly forward. Try not to slouch.  Hold the incentive spirometer in an upright position. INSTRUCTIONS FOR USE  1. Sit on the edge of your bed if possible, or sit up as far as you can in bed or on a chair. 2. Hold the incentive spirometer in an upright position. 3. Breathe out normally. 4. Place the mouthpiece in your mouth and seal your lips tightly around it. 5. Breathe in slowly and as deeply as possible, raising the piston or the ball toward the top of the column. 6. Hold your breath for 3-5 seconds or for as long as possible. Allow the piston or ball to fall to the bottom of the column. 7. Remove the mouthpiece from your mouth and breathe out normally. 8. Rest for a few seconds and repeat Steps 1 through 7 at least 10 times every 1-2 hours when you are awake. Take your time and take a few normal breaths between deep breaths. 9. The  spirometer may include an indicator to show your best effort. Use the indicator as a goal to work toward during each repetition. 10. After each set of 10 deep breaths, practice coughing to be sure your lungs are clear. If you have an incision (the cut made at the time of surgery), support your incision when coughing by placing a pillow or rolled up towels firmly against it. Once you are able to get out of bed, walk around indoors and cough well. You may stop using the incentive spirometer when instructed by your caregiver.  RISKS AND COMPLICATIONS  Take your time so you do not get dizzy or light-headed.  If you are in pain, you may need to take or ask for pain medication before doing incentive spirometry. It is harder to take a deep breath if you are having pain. AFTER USE  Rest and breathe slowly and easily.  It can be helpful to keep track of a log of your progress. Your caregiver can provide you with a simple table to help with this. If you are using the spirometer at home, follow these instructions: [place] IF:   You are having difficultly using the spirometer.  You have trouble using the spirometer as often as instructed.  Your pain medication is not giving enough relief while using the spirometer.  You develop fever of 100.5 F (38.1 C) or higher. SEEK IMMEDIATE MEDICAL CARE IF:   You cough up bloody sputum that had not been present before.  You develop fever of 102 F (38.9 C) or greater.  You develop worsening pain at or near the incision site. MAKE SURE YOU:   Understand these instructions.  Will watch your condition.  Will get help right away if you are not doing  well or get worse. Document Released: 10/03/2006 Document Revised: 08/15/2011 Document Reviewed: 12/04/2006 [facility] Patient Information 2014 [place], [place].   ________________________________________________________________________

## 2018-06-13 NOTE — Progress Notes (Addendum)
ekg 04-24-18 epic  cxr 09-05-17 epic  Stress 05-16-18 epic  lov Dr. Gwenlyn Found Cardiology 04-24-18  Echo 09-27-17 epic    only takes 81 mg asa stop 5 days prior per MD orders

## 2018-06-19 ENCOUNTER — Other Ambulatory Visit: Payer: Self-pay

## 2018-06-19 ENCOUNTER — Encounter (HOSPITAL_COMMUNITY)
Admission: RE | Admit: 2018-06-19 | Discharge: 2018-06-19 | Disposition: A | Discharge status: Home / Self Care | Payer: PPO | Source: Ambulatory Visit | Attending: Orthopedic Surgery | Admitting: Orthopedic Surgery

## 2018-06-19 ENCOUNTER — Encounter (HOSPITAL_COMMUNITY): Payer: Self-pay

## 2018-06-19 DIAGNOSIS — M1711 Unilateral primary osteoarthritis, right knee: Secondary | ICD-10-CM | POA: Insufficient documentation

## 2018-06-19 DIAGNOSIS — Z01812 Encounter for preprocedural laboratory examination: Secondary | ICD-10-CM | POA: Insufficient documentation

## 2018-06-19 HISTORY — DX: Cardiac arrhythmia, unspecified: I49.9

## 2018-06-19 HISTORY — DX: Dyspnea, unspecified: R06.00

## 2018-06-19 LAB — COMPREHENSIVE METABOLIC PANEL
ALBUMIN: 3.8 g/dL (ref 3.5–5.0)
ALT: 20 U/L (ref 0–44)
AST: 22 U/L (ref 15–41)
Alkaline Phosphatase: 61 U/L (ref 38–126)
Anion gap: 8 (ref 5–15)
BILIRUBIN TOTAL: 0.9 mg/dL (ref 0.3–1.2)
BUN: 23 mg/dL (ref 8–23)
CO2: 23 mmol/L (ref 22–32)
Calcium: 8.6 mg/dL — ABNORMAL LOW (ref 8.9–10.3)
Chloride: 107 mmol/L (ref 98–111)
Creatinine, Ser: 0.92 mg/dL (ref 0.61–1.24)
GFR calc Af Amer: >60 mL/min (ref >60)
GFR calc non Af Amer: >60 mL/min (ref >60)
GLUCOSE: 87 mg/dL (ref 70–99)
Potassium: 4.5 mmol/L (ref 3.5–5.1)
Sodium: 138 mmol/L (ref 135–145)
TOTAL PROTEIN: 6.7 g/dL (ref 6.5–8.1)

## 2018-06-19 LAB — PROTIME-INR
INR: 0.97
Prothrombin Time: 12.8 seconds (ref 11.4–15.2)

## 2018-06-19 LAB — CBC
HEMATOCRIT: 44.6 % (ref 39.0–52.0)
Hemoglobin: 13.9 g/dL (ref 13.0–17.0)
MCH: 29.5 pg (ref 26.0–34.0)
MCHC: 31.2 g/dL (ref 30.0–36.0)
MCV: 94.7 fL (ref 80.0–100.0)
Platelets: 203 10*3/uL (ref 150–400)
RBC: 4.71 MIL/uL (ref 4.22–5.81)
RDW: 14 % (ref 11.5–15.5)
WBC: 6.4 10*3/uL (ref 4.0–10.5)
nRBC: 0 % (ref 0.0–0.2)

## 2018-06-19 LAB — SURGICAL PCR SCREEN
MRSA, PCR: NEGATIVE
STAPHYLOCOCCUS AUREUS: NEGATIVE

## 2018-06-19 LAB — APTT: aPTT: 32 seconds (ref 24–36)

## 2018-06-19 NOTE — H&P (Signed)
TOTAL KNEE ADMISSION H&P  Patient is being admitted for right total knee arthroplasty.  Subjective:  Chief Complaint:right knee pain.  HPI: Joshua Moran., 73 y.o. male, has a history of pain and functional disability in the right knee due to arthritis and has failed non-surgical conservative treatments for greater than 12 weeks to includecorticosteriod injections, viscosupplementation injections and activity modification.  Onset of symptoms was gradual, starting several years ago with gradually worsening course since that time. The patient noted no past surgery on the right knee(s).  Patient currently rates pain in the right knee(s) at 7 out of 10 with activity. Patient has worsening of pain with activity and weight bearing, crepitus and joint swelling.  Patient has evidence of bone-on-bone arthritis by imaging studies. There is no active infection.  Patient Active Problem List   Diagnosis Date Noted  . Paroxysmal atrial fibrillation ([place]) 04/24/2018  . Chest pain 04/24/2018  . OA (osteoarthritis) of knee 01/01/2018  . Dyspnea 09/19/2017  . Hyperlipidemia 05/05/2015  . Abnormal CT scan of heart 05/05/2015   Past Medical History:  Diagnosis Date  . Abnormal screening CT of chest    coronary calcium in left main and RCA  . Anemia   . Arthritis   . Complication of anesthesia    Pt reports difficult urinating  . GERD (gastroesophageal reflux disease)   . Glaucoma   . Hyperlipidemia    statin intolerant    Past Surgical History:  Procedure Laterality Date  . KNEE ARTHROSCOPY W/ DEBRIDEMENT    . RETINAL DETACHMENT SURGERY Bilateral   . TOTAL KNEE ARTHROPLASTY Left 01/01/2018   Procedure: LEFT TOTAL KNEE ARTHROPLASTY;  Surgeon: Gaynelle Arabian, MD;  Location: [facility];  Service: Orthopedics;  Laterality: Left;  50 mins  . VASECTOMY      No current facility-administered medications for this encounter.    Current Outpatient Medications  Medication Sig Dispense Refill Last Dose  .  aspirin EC 81 MG tablet Take 81 mg by mouth daily.   Taking  . brimonidine (ALPHAGAN) 0.15 % ophthalmic solution Place 1 drop into the right eye 2 (two) times daily.   Taking  . cetirizine (ZYRTEC) 10 MG tablet Take 10 mg by mouth every evening.   Taking  . Cholecalciferol (VITAMIN D3) 2000 units TABS Take 2,000 Units by mouth every evening.    Taking  . ferrous sulfate 325 (65 FE) MG tablet Take 325 mg by mouth daily with breakfast.   Taking  . finasteride (PROSCAR) 5 MG tablet Take 5 mg by mouth daily.   Taking  . fluticasone (FLONASE) 50 MCG/ACT nasal spray Place 2 sprays into both nostrils 2 (two) times daily.    Taking  . Glucosamine-Chondroitin (COSAMIN DS PO) Take 1 tablet by mouth 2 (two) times daily.   Taking  . meloxicam (MOBIC) 15 MG tablet Take 15 mg by mouth daily.  4 Taking  . Multiple Vitamin (MULTIVITAMIN WITH MINERALS) TABS tablet Take 1 tablet by mouth every evening.    Taking  . rosuvastatin (CRESTOR) 5 MG tablet Take 1 tablet (5 mg total) by mouth daily at 6 PM. (Patient taking differently: Take 5 mg by mouth every Monday, Wednesday, and Friday. ) 90 tablet 3 Taking  . tamsulosin (FLOMAX) 0.4 MG CAPS capsule Take 0.4 mg by mouth daily after supper.   Taking  . sildenafil (REVATIO) 20 MG tablet Take 40-100 mg by mouth daily as needed (erectile dysfunction.).   5 Taking   Allergies  Allergen Reactions  .  Penicillins Other (See Comments)    Very flushed  Has patient had a PCN reaction causing immediate rash, facial/tongue/throat swelling, SOB or lightheadedness with hypotension: No Has patient had a PCN reaction causing severe rash involving mucus membranes or skin necrosis: No Has patient had a PCN reaction that required hospitalization No Has patient had a PCN reaction occurring within the last 10 years: No If all of the above answers are "NO", then may proceed with Cephalosporin use.   . Statins Other (See Comments)    Pt c/o muscle aches with use of statins.    Social  History   Tobacco Use  . Smoking status: Never Smoker  . Smokeless tobacco: Never Used  Substance Use Topics  . Alcohol use: Yes    Comment: Rare    Family History  Problem Relation Age of Onset  . Lung cancer Mother        smoker     Review of Systems  Constitutional: Negative for chills and fever.  HENT: Negative for congestion, sore throat and tinnitus.   Eyes: Negative for double vision, photophobia and pain.  Respiratory: Negative for cough, shortness of breath and wheezing.   Cardiovascular: Negative for chest pain, palpitations and orthopnea.  Gastrointestinal: Negative for heartburn, nausea and vomiting.  Genitourinary: Negative for dysuria, frequency and urgency.  Musculoskeletal: Positive for joint pain.  Neurological: Negative for dizziness, weakness and headaches.    Objective:  Physical Exam  Well nourished and well developed. General: Alert and oriented x3, cooperative and pleasant, no acute distress. Head: normocephalic, atraumatic, neck supple. Eyes: EOMI. Respiratory: breath sounds clear in all fields, no wheezing, rales, or rhonchi. Cardiovascular: Regular rate and rhythm, no murmurs, gallops or rubs.  Abdomen: non-tender to palpation and soft, normoactive bowel sounds. Musculoskeletal: Minimal to no antalgic gait, favoring his right knee.  Left knee exam: Minimal swelling. Well healed incisions, no erythema, nontender. Range of motion: 3 - 120 degrees. Knee is stable and nontender. Right Knee Exam: No effusion. No Swelling. Range of motion is 5-125 degrees. Marked crepitus on range of motion of the knee. Tenderness is greater medially than laterally. Stable knee. Calves soft and nontender. Motor function intact in LE. Strength 5/5 LE bilaterally. Neuro: Distal pulses 2+. Sensation to light touch intact in LE.  Vital signs in last 24 hours: Blood pressure: 114/78 mmHg Pulse: 56 bpm  Labs:   Estimated body mass index is 38.25 kg/m as calculated  from the following:   Height as of 05/15/18: 6' (1.829 m).   Weight as of 05/15/18: 127.9 kg.   Imaging Review Plain radiographs demonstrate severe degenerative joint disease of the right knee(s). The overall alignment isneutral. The bone quality appears to be adequate for age and reported activity level.   Preoperative templating of the joint replacement has been completed, documented, and submitted to the Operating Room personnel in order to optimize intra-operative equipment management.   Anticipated LOS equal to or greater than 2 midnights due to - Age 1 and older with one or more of the following:  - Obesity  - Expected need for hospital services (PT, OT, Nursing) required for safe  discharge  - Anticipated need for postoperative skilled nursing care or inpatient rehab  - Active co-morbidities: None OR   - Unanticipated findings during/Post Surgery: None  - Patient is a high risk of re-admission due to: None     Assessment/Plan:  End stage arthritis, right knee   The patient history, physical examination, clinical judgment of  the provider and imaging studies are consistent with end stage degenerative joint disease of the right knee(s) and total knee arthroplasty is deemed medically necessary. The treatment options including medical management, injection therapy arthroscopy and arthroplasty were discussed at length. The risks and benefits of total knee arthroplasty were presented and reviewed. The risks due to aseptic loosening, infection, stiffness, patella tracking problems, thromboembolic complications and other imponderables were discussed. The patient acknowledged the explanation, agreed to proceed with the plan and consent was signed. Patient is being admitted for inpatient treatment for surgery, pain control, PT, OT, prophylactic antibiotics, VTE prophylaxis, progressive ambulation and ADL's and discharge planning. The patient is planning to be discharged home.   Therapy  Plans: outpatient therapy at [facility] Disposition: Home with wife Planned DVT Prophylaxis: Xarelto 10 mg daily (hx atrial fibrillation) DME needed: None PCP: Dr. Melinda Crutch Cardiologist: Dr. Alvester Chou TXA: IV Allergies: PCN (hives) Anesthesia Concerns: None BMI: 38.9  - Patient was instructed on what medications to stop prior to surgery. - Follow-up visit in 2 weeks with Dr. Wynelle Link - Begin physical therapy following surgery - Pre-operative lab work as pre-surgical testing - Prescriptions will be provided in hospital at time of discharge  Theresa Duty, PA-C Orthopedic Surgery [facility] [place]

## 2018-06-19 NOTE — Progress Notes (Signed)
PCP:Dr. Lawerance Cruel  CARDIOLOGIST:  Dr. Gwenlyn Found  INFO IN Epic:ekg, echo, stress   INFO ON CHART:  Mild pulmonary HTN per echo  BLOOD THINNERS AND LAST DOSES:81 mg asa stopped 06-19-17 ____________________________________  PATIENT SYMPTOMS AT TIME OF PREOP:  none   :ekg 04-24-18 epic  cxr 09-05-17 epic  Stress 05-16-18 epic  lov Dr. Gwenlyn Found Cardiology 04-24-18  Echo 09-27-17 epic    only takes 81 mg asa stop 5 days prior per MD orders

## 2018-06-24 MED ORDER — VANCOMYCIN HCL 10 G IV SOLR
1500.0000 mg | INTRAVENOUS | Status: AC
  Administered 2018-06-25: 1500 mg via INTRAVENOUS
  Filled 2018-06-24: qty 1500

## 2018-06-24 MED ORDER — BUPIVACAINE LIPOSOME 1.3 % IJ SUSP
20.0000 mL | Freq: Once | INTRAMUSCULAR | Status: DC
  Filled 2018-06-24: qty 20

## 2018-06-25 ENCOUNTER — Encounter (HOSPITAL_COMMUNITY)
Admission: RE | Disposition: A | Discharge status: Home / Self Care | Payer: Self-pay | Source: Home / Self Care | Attending: Orthopedic Surgery

## 2018-06-25 ENCOUNTER — Inpatient Hospital Stay (HOSPITAL_COMMUNITY)
Admission: RE | Admit: 2018-06-25 | Discharge: 2018-06-27 | DRG: 470 | Disposition: A | Discharge status: Home / Self Care | Payer: PPO | Attending: Orthopedic Surgery | Admitting: Orthopedic Surgery

## 2018-06-25 ENCOUNTER — Other Ambulatory Visit: Payer: Self-pay

## 2018-06-25 ENCOUNTER — Encounter (HOSPITAL_COMMUNITY): Payer: Self-pay | Admitting: *Deleted

## 2018-06-25 ENCOUNTER — Inpatient Hospital Stay (HOSPITAL_COMMUNITY): Payer: PPO | Admitting: Physician Assistant

## 2018-06-25 ENCOUNTER — Inpatient Hospital Stay (HOSPITAL_COMMUNITY): Payer: PPO | Admitting: Anesthesiology

## 2018-06-25 DIAGNOSIS — Z880 Allergy status to penicillin: Secondary | ICD-10-CM

## 2018-06-25 DIAGNOSIS — M1710 Unilateral primary osteoarthritis, unspecified knee: Secondary | ICD-10-CM | POA: Diagnosis present

## 2018-06-25 DIAGNOSIS — E785 Hyperlipidemia, unspecified: Secondary | ICD-10-CM | POA: Diagnosis not present

## 2018-06-25 DIAGNOSIS — I480 Paroxysmal atrial fibrillation: Secondary | ICD-10-CM | POA: Diagnosis present

## 2018-06-25 DIAGNOSIS — Z96652 Presence of left artificial knee joint: Secondary | ICD-10-CM | POA: Diagnosis present

## 2018-06-25 DIAGNOSIS — G8918 Other acute postprocedural pain: Secondary | ICD-10-CM | POA: Diagnosis not present

## 2018-06-25 DIAGNOSIS — Z888 Allergy status to other drugs, medicaments and biological substances status: Secondary | ICD-10-CM

## 2018-06-25 DIAGNOSIS — Z6838 Body mass index (BMI) 38.0-38.9, adult: Secondary | ICD-10-CM

## 2018-06-25 DIAGNOSIS — Z801 Family history of malignant neoplasm of trachea, bronchus and lung: Secondary | ICD-10-CM

## 2018-06-25 DIAGNOSIS — M1711 Unilateral primary osteoarthritis, right knee: Principal | ICD-10-CM | POA: Diagnosis present

## 2018-06-25 DIAGNOSIS — E669 Obesity, unspecified: Secondary | ICD-10-CM | POA: Diagnosis not present

## 2018-06-25 DIAGNOSIS — Z881 Allergy status to other antibiotic agents status: Secondary | ICD-10-CM

## 2018-06-25 DIAGNOSIS — M179 Osteoarthritis of knee, unspecified: Secondary | ICD-10-CM | POA: Diagnosis present

## 2018-06-25 DIAGNOSIS — R42 Dizziness and giddiness: Secondary | ICD-10-CM | POA: Diagnosis not present

## 2018-06-25 DIAGNOSIS — M25761 Osteophyte, right knee: Secondary | ICD-10-CM | POA: Diagnosis not present

## 2018-06-25 DIAGNOSIS — K219 Gastro-esophageal reflux disease without esophagitis: Secondary | ICD-10-CM | POA: Diagnosis not present

## 2018-06-25 DIAGNOSIS — H409 Unspecified glaucoma: Secondary | ICD-10-CM | POA: Diagnosis not present

## 2018-06-25 HISTORY — PX: TOTAL KNEE ARTHROPLASTY: SHX125

## 2018-06-25 LAB — TYPE AND SCREEN
ABO/RH(D): A POS
Antibody Screen: NEGATIVE

## 2018-06-25 SURGERY — ARTHROPLASTY, KNEE, TOTAL
Anesthesia: Spinal | Laterality: Right

## 2018-06-25 MED ORDER — METOCLOPRAMIDE HCL 5 MG PO TABS: 5.0000 mg | ORAL_TABLET | Freq: Three times a day (TID) | ORAL | Status: DC | PRN

## 2018-06-25 MED ORDER — DOCUSATE SODIUM 100 MG PO CAPS
100.0000 mg | ORAL_CAPSULE | Freq: Two times a day (BID) | ORAL | Status: DC
  Administered 2018-06-25 – 2018-06-26 (×3): 100 mg via ORAL
  Filled 2018-06-25 (×3): qty 1

## 2018-06-25 MED ORDER — GLYCOPYRROLATE PF 0.2 MG/ML IJ SOSY
PREFILLED_SYRINGE | INTRAMUSCULAR | Status: DC | PRN
  Administered 2018-06-25: .2 mL via INTRAVENOUS

## 2018-06-25 MED ORDER — DIPHENHYDRAMINE HCL 12.5 MG/5ML PO ELIX: 12.5000 mg | ORAL_SOLUTION | ORAL | Status: DC | PRN

## 2018-06-25 MED ORDER — SODIUM CHLORIDE (PF) 0.9 % IJ SOLN
INTRAMUSCULAR | Status: AC
  Filled 2018-06-25: qty 50

## 2018-06-25 MED ORDER — PHENYLEPHRINE 40 MCG/ML (10ML) SYRINGE FOR IV PUSH (FOR BLOOD PRESSURE SUPPORT)
PREFILLED_SYRINGE | INTRAVENOUS | Status: DC | PRN
  Administered 2018-06-25 (×2): 80 ug via INTRAVENOUS

## 2018-06-25 MED ORDER — CHLORHEXIDINE GLUCONATE 4 % EX LIQD: 60.0000 mL | Freq: Once | CUTANEOUS | Status: DC

## 2018-06-25 MED ORDER — ATROPINE SULFATE 1 MG/10ML IJ SOSY
PREFILLED_SYRINGE | INTRAMUSCULAR | Status: AC
  Filled 2018-06-25: qty 10

## 2018-06-25 MED ORDER — ACETAMINOPHEN 10 MG/ML IV SOLN: 1000.0000 mg | Freq: Once | INTRAVENOUS | Status: DC | PRN

## 2018-06-25 MED ORDER — GLYCOPYRROLATE PF 0.2 MG/ML IJ SOSY: PREFILLED_SYRINGE | INTRAMUSCULAR | Status: DC | PRN

## 2018-06-25 MED ORDER — MIDAZOLAM HCL 2 MG/2ML IJ SOLN
INTRAMUSCULAR | Status: AC
  Administered 2018-06-25: 1 mg via INTRAVENOUS
  Filled 2018-06-25: qty 2

## 2018-06-25 MED ORDER — ACETAMINOPHEN 10 MG/ML IV SOLN: 1000.0000 mg | Freq: Four times a day (QID) | INTRAVENOUS | Status: DC

## 2018-06-25 MED ORDER — ACETAMINOPHEN 500 MG PO TABS
1000.0000 mg | ORAL_TABLET | Freq: Four times a day (QID) | ORAL | Status: AC
  Administered 2018-06-25 – 2018-06-26 (×4): 1000 mg via ORAL
  Filled 2018-06-25 (×4): qty 2

## 2018-06-25 MED ORDER — METHOCARBAMOL 500 MG IVPB - SIMPLE MED
500.0000 mg | Freq: Four times a day (QID) | INTRAVENOUS | Status: DC | PRN
  Administered 2018-06-25: 500 mg via INTRAVENOUS
  Filled 2018-06-25: qty 50

## 2018-06-25 MED ORDER — SODIUM CHLORIDE 0.9 % IV SOLN
INTRAVENOUS | Status: DC
  Administered 2018-06-25 – 2018-06-26 (×2): via INTRAVENOUS

## 2018-06-25 MED ORDER — BISACODYL 10 MG RE SUPP: 10.0000 mg | Freq: Every day | RECTAL | Status: DC | PRN

## 2018-06-25 MED ORDER — RIVAROXABAN 10 MG PO TABS
10.0000 mg | ORAL_TABLET | Freq: Every day | ORAL | Status: DC
  Administered 2018-06-26 – 2018-06-27 (×2): 10 mg via ORAL
  Filled 2018-06-25 (×2): qty 1

## 2018-06-25 MED ORDER — EPHEDRINE 5 MG/ML INJ
INTRAVENOUS | Status: AC
  Filled 2018-06-25: qty 10

## 2018-06-25 MED ORDER — BUPIVACAINE IN DEXTROSE 0.75-8.25 % IT SOLN
INTRATHECAL | Status: DC | PRN
  Administered 2018-06-25: 1.6 mL via INTRATHECAL

## 2018-06-25 MED ORDER — GABAPENTIN 300 MG PO CAPS
300.0000 mg | ORAL_CAPSULE | Freq: Three times a day (TID) | ORAL | Status: DC
  Administered 2018-06-25 – 2018-06-26 (×4): 300 mg via ORAL
  Filled 2018-06-25 (×4): qty 1

## 2018-06-25 MED ORDER — PROPOFOL 500 MG/50ML IV EMUL
INTRAVENOUS | Status: DC | PRN
  Administered 2018-06-25: 100 ug/kg/min via INTRAVENOUS

## 2018-06-25 MED ORDER — ACETAMINOPHEN 500 MG PO TABS: 1000.0000 mg | ORAL_TABLET | Freq: Once | ORAL | Status: DC | PRN

## 2018-06-25 MED ORDER — BRIMONIDINE TARTRATE 0.15 % OP SOLN
1.0000 [drp] | Freq: Two times a day (BID) | OPHTHALMIC | Status: DC
  Administered 2018-06-25 – 2018-06-26 (×3): 1 [drp] via OPHTHALMIC
  Filled 2018-06-25: qty 5

## 2018-06-25 MED ORDER — FENTANYL CITRATE (PF) 100 MCG/2ML IJ SOLN
INTRAMUSCULAR | Status: AC
  Filled 2018-06-25: qty 2

## 2018-06-25 MED ORDER — ONDANSETRON HCL 4 MG/2ML IJ SOLN: 4.0000 mg | Freq: Four times a day (QID) | INTRAMUSCULAR | Status: DC | PRN

## 2018-06-25 MED ORDER — EPHEDRINE SULFATE-NACL 50-0.9 MG/10ML-% IV SOSY
PREFILLED_SYRINGE | INTRAVENOUS | Status: DC | PRN
  Administered 2018-06-25: 10 mL via INTRAVENOUS

## 2018-06-25 MED ORDER — SODIUM CHLORIDE (PF) 0.9 % IJ SOLN
INTRAMUSCULAR | Status: DC | PRN
  Administered 2018-06-25: 60 mL

## 2018-06-25 MED ORDER — FENTANYL CITRATE (PF) 100 MCG/2ML IJ SOLN
INTRAMUSCULAR | Status: AC
  Filled 2018-06-25: qty 4

## 2018-06-25 MED ORDER — ACETAMINOPHEN 160 MG/5ML PO SOLN: 1000.0000 mg | Freq: Once | ORAL | Status: DC | PRN

## 2018-06-25 MED ORDER — GLYCOPYRROLATE PF 0.2 MG/ML IJ SOSY
PREFILLED_SYRINGE | INTRAMUSCULAR | Status: AC
  Filled 2018-06-25: qty 1

## 2018-06-25 MED ORDER — MIDAZOLAM HCL 2 MG/2ML IJ SOLN
1.0000 mg | INTRAMUSCULAR | Status: AC
  Administered 2018-06-25: 1 mg via INTRAVENOUS

## 2018-06-25 MED ORDER — BUPIVACAINE-EPINEPHRINE (PF) 0.5% -1:200000 IJ SOLN
INTRAMUSCULAR | Status: DC | PRN
  Administered 2018-06-25: 15 mL via PERINEURAL

## 2018-06-25 MED ORDER — FENTANYL CITRATE (PF) 100 MCG/2ML IJ SOLN
INTRAMUSCULAR | Status: DC | PRN
  Administered 2018-06-25: 25 ug via INTRAVENOUS
  Administered 2018-06-25: 50 ug via INTRAVENOUS
  Administered 2018-06-25: 25 ug via INTRAVENOUS
  Administered 2018-06-25 (×2): 50 ug via INTRAVENOUS

## 2018-06-25 MED ORDER — FENTANYL CITRATE (PF) 100 MCG/2ML IJ SOLN
25.0000 ug | INTRAMUSCULAR | Status: DC | PRN
  Administered 2018-06-25 (×2): 50 ug via INTRAVENOUS

## 2018-06-25 MED ORDER — PROMETHAZINE HCL 25 MG/ML IJ SOLN: 6.2500 mg | INTRAMUSCULAR | Status: DC | PRN

## 2018-06-25 MED ORDER — SODIUM CHLORIDE 0.9 % IR SOLN
Status: DC | PRN
  Administered 2018-06-25: 1000 mL

## 2018-06-25 MED ORDER — ONDANSETRON HCL 4 MG PO TABS: 4.0000 mg | ORAL_TABLET | Freq: Four times a day (QID) | ORAL | Status: DC | PRN

## 2018-06-25 MED ORDER — ONDANSETRON HCL 4 MG/2ML IJ SOLN
INTRAMUSCULAR | Status: DC | PRN
  Administered 2018-06-25: 4 mg via INTRAVENOUS

## 2018-06-25 MED ORDER — PROPOFOL 10 MG/ML IV BOLUS
INTRAVENOUS | Status: DC | PRN
  Administered 2018-06-25: 200 mg via INTRAVENOUS

## 2018-06-25 MED ORDER — OXYCODONE HCL 5 MG PO TABS
5.0000 mg | ORAL_TABLET | Freq: Once | ORAL | Status: AC | PRN
  Administered 2018-06-25: 5 mg via ORAL

## 2018-06-25 MED ORDER — ACETAMINOPHEN 10 MG/ML IV SOLN
1000.0000 mg | Freq: Once | INTRAVENOUS | Status: DC | PRN
  Administered 2018-06-25: 1000 mg via INTRAVENOUS
  Filled 2018-06-25: qty 100

## 2018-06-25 MED ORDER — OXYCODONE HCL 5 MG PO TABS: 5.0000 mg | ORAL_TABLET | Freq: Once | ORAL | Status: DC | PRN

## 2018-06-25 MED ORDER — FLEET ENEMA 7-19 GM/118ML RE ENEM: 1.0000 | ENEMA | Freq: Once | RECTAL | Status: DC | PRN

## 2018-06-25 MED ORDER — TRANEXAMIC ACID-NACL 1000-0.7 MG/100ML-% IV SOLN
1000.0000 mg | INTRAVENOUS | Status: AC
  Administered 2018-06-25: 1000 mg via INTRAVENOUS
  Filled 2018-06-25: qty 100

## 2018-06-25 MED ORDER — FENTANYL CITRATE (PF) 100 MCG/2ML IJ SOLN
INTRAMUSCULAR | Status: AC
  Administered 2018-06-25: 50 ug via INTRAVENOUS
  Filled 2018-06-25: qty 2

## 2018-06-25 MED ORDER — TAMSULOSIN HCL 0.4 MG PO CAPS
0.4000 mg | ORAL_CAPSULE | Freq: Every day | ORAL | Status: DC
  Administered 2018-06-25 – 2018-06-26 (×2): 0.4 mg via ORAL
  Filled 2018-06-25 (×2): qty 1

## 2018-06-25 MED ORDER — MORPHINE SULFATE (PF) 2 MG/ML IV SOLN: 1.0000 mg | INTRAVENOUS | Status: DC | PRN

## 2018-06-25 MED ORDER — PROPOFOL 10 MG/ML IV BOLUS
INTRAVENOUS | Status: AC
  Filled 2018-06-25: qty 40

## 2018-06-25 MED ORDER — LORATADINE 10 MG PO TABS
10.0000 mg | ORAL_TABLET | Freq: Every day | ORAL | Status: DC
  Administered 2018-06-25 – 2018-06-26 (×2): 10 mg via ORAL
  Filled 2018-06-25 (×2): qty 1

## 2018-06-25 MED ORDER — METOCLOPRAMIDE HCL 5 MG/ML IJ SOLN: 5.0000 mg | Freq: Three times a day (TID) | INTRAMUSCULAR | Status: DC | PRN

## 2018-06-25 MED ORDER — LACTATED RINGERS IV SOLN
INTRAVENOUS | Status: DC
  Administered 2018-06-25 (×4): via INTRAVENOUS

## 2018-06-25 MED ORDER — DEXAMETHASONE SODIUM PHOSPHATE 10 MG/ML IJ SOLN
10.0000 mg | Freq: Once | INTRAMUSCULAR | Status: AC
  Administered 2018-06-26: 10 mg via INTRAVENOUS
  Filled 2018-06-25: qty 1

## 2018-06-25 MED ORDER — OXYCODONE HCL 5 MG PO TABS
5.0000 mg | ORAL_TABLET | ORAL | Status: DC | PRN
  Administered 2018-06-26 – 2018-06-27 (×3): 5 mg via ORAL
  Filled 2018-06-25 (×3): qty 1

## 2018-06-25 MED ORDER — DEXAMETHASONE SODIUM PHOSPHATE 10 MG/ML IJ SOLN
8.0000 mg | Freq: Once | INTRAMUSCULAR | Status: AC
  Administered 2018-06-25: 10 mg via INTRAVENOUS

## 2018-06-25 MED ORDER — FINASTERIDE 5 MG PO TABS
5.0000 mg | ORAL_TABLET | Freq: Every day | ORAL | Status: DC
  Administered 2018-06-26: 5 mg via ORAL
  Filled 2018-06-25: qty 1

## 2018-06-25 MED ORDER — ONDANSETRON HCL 4 MG/2ML IJ SOLN
INTRAMUSCULAR | Status: AC
  Filled 2018-06-25: qty 2

## 2018-06-25 MED ORDER — METHOCARBAMOL 500 MG IVPB - SIMPLE MED
INTRAVENOUS | Status: AC
  Filled 2018-06-25: qty 50

## 2018-06-25 MED ORDER — FLUTICASONE PROPIONATE 50 MCG/ACT NA SUSP
2.0000 | Freq: Two times a day (BID) | NASAL | Status: DC
  Administered 2018-06-25 – 2018-06-26 (×3): 2 via NASAL
  Filled 2018-06-25: qty 16

## 2018-06-25 MED ORDER — OXYCODONE HCL 5 MG/5ML PO SOLN
5.0000 mg | Freq: Once | ORAL | Status: DC | PRN
  Filled 2018-06-25: qty 5

## 2018-06-25 MED ORDER — MENTHOL 3 MG MT LOZG: 1.0000 | LOZENGE | OROMUCOSAL | Status: DC | PRN

## 2018-06-25 MED ORDER — OXYCODONE HCL 5 MG/5ML PO SOLN
5.0000 mg | Freq: Once | ORAL | Status: AC | PRN
  Filled 2018-06-25: qty 5

## 2018-06-25 MED ORDER — PHENYLEPHRINE 40 MCG/ML (10ML) SYRINGE FOR IV PUSH (FOR BLOOD PRESSURE SUPPORT)
PREFILLED_SYRINGE | INTRAVENOUS | Status: AC
  Filled 2018-06-25: qty 10

## 2018-06-25 MED ORDER — OXYCODONE HCL 5 MG PO TABS
ORAL_TABLET | ORAL | Status: AC
  Filled 2018-06-25: qty 1

## 2018-06-25 MED ORDER — FENTANYL CITRATE (PF) 100 MCG/2ML IJ SOLN: 25.0000 ug | INTRAMUSCULAR | Status: DC | PRN

## 2018-06-25 MED ORDER — METHOCARBAMOL 500 MG PO TABS: 500.0000 mg | ORAL_TABLET | Freq: Four times a day (QID) | ORAL | Status: DC | PRN

## 2018-06-25 MED ORDER — POLYETHYLENE GLYCOL 3350 17 G PO PACK: 17.0000 g | PACK | Freq: Every day | ORAL | Status: DC | PRN

## 2018-06-25 MED ORDER — FENTANYL CITRATE (PF) 100 MCG/2ML IJ SOLN
50.0000 ug | INTRAMUSCULAR | Status: AC
  Administered 2018-06-25: 50 ug via INTRAVENOUS

## 2018-06-25 MED ORDER — PHENOL 1.4 % MT LIQD
1.0000 | OROMUCOSAL | Status: DC | PRN
  Filled 2018-06-25: qty 177

## 2018-06-25 MED ORDER — FERROUS SULFATE 325 (65 FE) MG PO TABS
325.0000 mg | ORAL_TABLET | Freq: Every day | ORAL | Status: DC
  Administered 2018-06-26 – 2018-06-27 (×2): 325 mg via ORAL
  Filled 2018-06-25 (×2): qty 1

## 2018-06-25 MED ORDER — OXYCODONE HCL 5 MG PO TABS: 10.0000 mg | ORAL_TABLET | ORAL | Status: DC | PRN

## 2018-06-25 MED ORDER — CLINDAMYCIN PHOSPHATE 900 MG/50ML IV SOLN
900.0000 mg | Freq: Once | INTRAVENOUS | Status: AC
  Administered 2018-06-25: 900 mg via INTRAVENOUS
  Filled 2018-06-25: qty 50

## 2018-06-25 MED ORDER — SODIUM CHLORIDE (PF) 0.9 % IJ SOLN
INTRAMUSCULAR | Status: AC
  Filled 2018-06-25: qty 10

## 2018-06-25 MED ORDER — CLINDAMYCIN PHOSPHATE 600 MG/50ML IV SOLN
600.0000 mg | Freq: Four times a day (QID) | INTRAVENOUS | Status: AC
  Administered 2018-06-25 – 2018-06-26 (×2): 600 mg via INTRAVENOUS
  Filled 2018-06-25 (×2): qty 50

## 2018-06-25 MED ORDER — BUPIVACAINE LIPOSOME 1.3 % IJ SUSP
INTRAMUSCULAR | Status: DC | PRN
  Administered 2018-06-25: 20 mL

## 2018-06-25 MED ORDER — DEXAMETHASONE SODIUM PHOSPHATE 10 MG/ML IJ SOLN
INTRAMUSCULAR | Status: AC
  Filled 2018-06-25: qty 1

## 2018-06-25 SURGICAL SUPPLY — 64 items
ATTUNE MED DOME PAT 41 KNEE (Knees) ×1 IMPLANT
ATTUNE MED DOME PAT 41MM KNEE (Knees) ×1 IMPLANT
ATTUNE PS FEM RT SZ 8 CEM KNEE (Femur) ×2 IMPLANT
ATTUNE PSRP INSR SZ8 8 KNEE (Insert) ×1 IMPLANT
ATTUNE PSRP INSR SZ8 8MM KNEE (Insert) ×1 IMPLANT
BAG ZIPLOCK 12X15 (MISCELLANEOUS) ×3 IMPLANT
BANDAGE ACE 6X5 VEL STRL LF (GAUZE/BANDAGES/DRESSINGS) ×3 IMPLANT
BASE TIBIAL ROT PLAT SZ 8 KNEE (Knees) IMPLANT
BLADE SAG 18X100X1.27 (BLADE) ×3 IMPLANT
BLADE SAW SGTL 11.0X1.19X90.0M (BLADE) ×3 IMPLANT
BLADE SURG SZ10 CARB STEEL (BLADE) ×6 IMPLANT
BOWL SMART MIX CTS (DISPOSABLE) ×3 IMPLANT
CEMENT HV SMART SET (Cement) ×4 IMPLANT
CLOSURE STERI-STRIP 1/2X4 (GAUZE/BANDAGES/DRESSINGS) ×1
CLOSURE WOUND 1/2 X4 (GAUZE/BANDAGES/DRESSINGS) ×2
CLSR STERI-STRIP ANTIMIC 1/2X4 (GAUZE/BANDAGES/DRESSINGS) ×1 IMPLANT
COVER SURGICAL LIGHT HANDLE (MISCELLANEOUS) ×3 IMPLANT
COVER WAND RF STERILE (DRAPES) ×2 IMPLANT
CUFF TOURN SGL QUICK 34 (TOURNIQUET CUFF) ×2
CUFF TRNQT CYL 34X4X40X1 (TOURNIQUET CUFF) ×1 IMPLANT
DECANTER SPIKE VIAL GLASS SM (MISCELLANEOUS) ×3 IMPLANT
DRAPE U-SHAPE 47X51 STRL (DRAPES) ×3 IMPLANT
DRSG ADAPTIC 3X8 NADH LF (GAUZE/BANDAGES/DRESSINGS) ×3 IMPLANT
DRSG PAD ABDOMINAL 8X10 ST (GAUZE/BANDAGES/DRESSINGS) ×3 IMPLANT
DURAPREP 26ML APPLICATOR (WOUND CARE) ×3 IMPLANT
ELECT REM PT RETURN 15FT ADLT (MISCELLANEOUS) ×3 IMPLANT
EVACUATOR 1/8 PVC DRAIN (DRAIN) ×3 IMPLANT
GAUZE SPONGE 4X4 12PLY STRL (GAUZE/BANDAGES/DRESSINGS) ×3 IMPLANT
GLOVE BIO SURGEON STRL SZ 6.5 (GLOVE) ×1 IMPLANT
GLOVE BIO SURGEON STRL SZ7 (GLOVE) ×1 IMPLANT
GLOVE BIO SURGEON STRL SZ8 (GLOVE) ×3 IMPLANT
GLOVE BIO SURGEONS STRL SZ 6.5 (GLOVE) ×1
GLOVE BIOGEL PI IND STRL 6.5 (GLOVE) ×1 IMPLANT
GLOVE BIOGEL PI IND STRL 7.0 (GLOVE) ×1 IMPLANT
GLOVE BIOGEL PI IND STRL 8 (GLOVE) ×1 IMPLANT
GLOVE BIOGEL PI INDICATOR 6.5 (GLOVE) ×2
GLOVE BIOGEL PI INDICATOR 7.0 (GLOVE)
GLOVE BIOGEL PI INDICATOR 8 (GLOVE) ×2
GLOVE SURG SS PI 6.5 STRL IVOR (GLOVE) ×5 IMPLANT
GOWN STRL REUS W/TWL LRG LVL3 (GOWN DISPOSABLE) ×10 IMPLANT
GOWN STRL REUS W/TWL XL LVL3 (GOWN DISPOSABLE) ×3 IMPLANT
HANDPIECE INTERPULSE COAX TIP (DISPOSABLE) ×2
HOLDER FOLEY CATH W/STRAP (MISCELLANEOUS) IMPLANT
IMMOBILIZER KNEE 20 (SOFTGOODS) ×5 IMPLANT
IMMOBILIZER KNEE 20 THIGH 36 (SOFTGOODS) ×1 IMPLANT
MANIFOLD NEPTUNE II (INSTRUMENTS) ×3 IMPLANT
NS IRRIG 1000ML POUR BTL (IV SOLUTION) ×3 IMPLANT
PACK TOTAL KNEE CUSTOM (KITS) ×3 IMPLANT
PAD ABD 8X10 STRL (GAUZE/BANDAGES/DRESSINGS) ×2 IMPLANT
PADDING CAST COTTON 6X4 STRL (CAST SUPPLIES) ×5 IMPLANT
PIN STEINMAN FIXATION KNEE (PIN) ×2 IMPLANT
PROTECTOR NERVE ULNAR (MISCELLANEOUS) ×3 IMPLANT
SET HNDPC FAN SPRY TIP SCT (DISPOSABLE) ×1 IMPLANT
STRIP CLOSURE SKIN 1/2X4 (GAUZE/BANDAGES/DRESSINGS) ×4 IMPLANT
SUT MNCRL AB 4-0 PS2 18 (SUTURE) ×3 IMPLANT
SUT STRATAFIX 0 PDS 27 VIOLET (SUTURE) ×3
SUT VIC AB 2-0 CT1 27 (SUTURE) ×8
SUT VIC AB 2-0 CT1 TAPERPNT 27 (SUTURE) ×3 IMPLANT
SUTURE STRATFX 0 PDS 27 VIOLET (SUTURE) ×1 IMPLANT
TIBIAL BASE ROT PLAT SZ 8 KNEE (Knees) ×3 IMPLANT
TRAY FOLEY MTR SLVR 16FR STAT (SET/KITS/TRAYS/PACK) ×3 IMPLANT
WATER STERILE IRR 1000ML POUR (IV SOLUTION) ×6 IMPLANT
WRAP KNEE MAXI GEL POST OP (GAUZE/BANDAGES/DRESSINGS) ×3 IMPLANT
YANKAUER SUCT BULB TIP 10FT TU (MISCELLANEOUS) ×3 IMPLANT

## 2018-06-25 NOTE — Anesthesia Procedure Notes (Signed)
Anesthesia Regional Block: Adductor canal block   Pre-Anesthetic Checklist: ,, timeout performed, Correct Patient, Correct Site, Correct Laterality, Correct Procedure, Correct Position, site marked, Risks and benefits discussed, pre-op evaluation,  At surgeon's request and post-op pain management  Laterality: Right  Prep: Maximum Sterile Barrier Precautions used, chloraprep       Needles:  Injection technique: Single-shot  Needle Type: Echogenic Stimulator Needle     Needle Length: 9cm  Needle Gauge: 22     Additional Needles:   Procedures:,,,, ultrasound used (permanent image in chart),,,,  Narrative:  Start time: 06/25/2018 3:11 PM End time: 06/25/2018 3:13 PM Injection made incrementally with aspirations every 5 mL.  Performed by: Personally  Anesthesiologist: [name], MD  Additional Notes: Risks, benefits, and alternative discussed. Patient gave consent for procedure. Patient prepped and draped in sterile fashion. Sedation administered, patient remains easily responsive to voice. Relevant anatomy identified with ultrasound guidance. Local anesthetic given in 5cc increments with no signs or symptoms of intravascular injection. No pain or paraesthesias with injection. Patient monitored throughout procedure with signs of LAST or immediate complications. Tolerated well. Ultrasound image placed in chart.  Tawny Asal, MD

## 2018-06-25 NOTE — Anesthesia Postprocedure Evaluation (Signed)
Anesthesia Post Note  Patient: Joshua Moran.  Procedure(s) Performed: TOTAL KNEE ARTHROPLASTY (Right )     Patient location during evaluation: PACU Anesthesia Type: Spinal Level of consciousness: awake and alert Pain management: pain level controlled Vital Signs Assessment: post-procedure vital signs reviewed and stable Respiratory status: spontaneous breathing, nonlabored ventilation and respiratory function stable Cardiovascular status: blood pressure returned to baseline and stable Postop Assessment: no apparent nausea or vomiting and spinal receding Anesthetic complications: no    Last Vitals:  Vitals:   06/25/18 1811 06/25/18 2013  BP: (!) 142/73 (!) 155/99  Pulse: (!) 105 (!) 103  Resp: 16 19  Temp: 36.7 C (!) 36.4 C  SpO2: 99% 97%    Last Pain:  Vitals:   06/25/18 2013  TempSrc: Oral  PainSc:                  [name]

## 2018-06-25 NOTE — Anesthesia Procedure Notes (Signed)
Date/Time: 06/25/2018 3:27 PM Performed by: Glory Buff, CRNA Oxygen Delivery Method: Nasal cannula

## 2018-06-25 NOTE — Progress Notes (Signed)
Vancomycin started by previous RN. Upon taking patient to the rest room, RN noticed rash extending up the patients are. This was not there previously, per patient. Pt denies any SOB or throat itching. NAD noted. Antibiotic stopped at 1405. Dr. Wynelle Link made aware.

## 2018-06-25 NOTE — Anesthesia Procedure Notes (Signed)
Procedure Name: LMA Insertion Date/Time: 06/25/2018 3:59 PM Performed by: [name], MD Preoxygenation: Pre-oxygenation with 100% oxygen Induction Type: IV induction LMA: LMA inserted LMA Size: 4.0 Number of attempts: 1 Placement Confirmation: positive ETCO2 and breath sounds checked- equal and bilateral Tube secured with: Tape Dental Injury: Teeth and Oropharynx as per pre-operative assessment

## 2018-06-25 NOTE — Progress Notes (Signed)
Assisted Dr. Howze with right, ultrasound guided, adductor canal block. Side rails up, monitors on throughout procedure. See vital signs in flow sheet. Tolerated Procedure well.  

## 2018-06-25 NOTE — Anesthesia Preprocedure Evaluation (Addendum)
Anesthesia Evaluation  Patient identified by MRN, date of birth, ID band Patient awake    Reviewed: Allergy & Precautions, NPO status , Patient's Chart, lab work & pertinent test results  History of Anesthesia Complications Negative for: history of anesthetic complications  Airway Mallampati: II  TM Distance: >3 FB Neck ROM: Full    Dental  (+) Teeth Intact   Pulmonary neg pulmonary ROS,    Pulmonary exam normal breath sounds clear to auscultation       Cardiovascular Normal cardiovascular exam+ dysrhythmias Atrial Fibrillation  Rhythm:Regular Rate:Normal  Nuclear stress 05/16/18: EF 44%, apical hypokinesis, fixed small, moderate intensity apical lateral perfusion defect, suspect prior infarction, no evidence for ischemia, atrial fibrillation noted.    Neuro/Psych negative neurological ROS     GI/Hepatic Neg liver ROS, GERD  ,  Endo/Other  negative endocrine ROS  Renal/GU negative Renal ROS     Musculoskeletal  (+) Arthritis , Osteoarthritis,    Abdominal   Peds  Hematology negative hematology ROS (+)   Anesthesia Other Findings Day of surgery medications reviewed with the patient.  Reproductive/Obstetrics                            Anesthesia Physical Anesthesia Plan  ASA: II  Anesthesia Plan: Spinal   Post-op Pain Management:  Regional for Post-op pain   Induction:   PONV Risk Score and Plan: 1 and Treatment may vary due to age or medical condition, Ondansetron, Propofol infusion and Dexamethasone  Airway Management Planned: Simple Face Mask and Natural Airway  Additional Equipment:   Intra-op Plan:   Post-operative Plan:   Informed Consent: I have reviewed the patients History and Physical, chart, labs and discussed the procedure including the risks, benefits and alternatives for the proposed anesthesia with the patient or authorized representative who has indicated his/her  understanding and acceptance.     Dental advisory given  Plan Discussed with: CRNA  Anesthesia Plan Comments: (Spinal + adductor canal block)       Anesthesia Quick Evaluation

## 2018-06-25 NOTE — Anesthesia Procedure Notes (Signed)
Spinal  Patient location during procedure: OR Start time: 06/25/2018 3:32 PM End time: 06/25/2018 3:36 PM Staffing Anesthesiologist: [name], MD Resident/CRNA: Glory Buff, CRNA Performed: resident/CRNA  Preanesthetic Checklist Completed: patient identified, site marked, surgical consent, pre-op evaluation, timeout performed, IV checked, risks and benefits discussed and monitors and equipment checked Spinal Block Patient position: sitting Prep: DuraPrep Patient monitoring: heart rate, continuous pulse ox and blood pressure Approach: midline Location: L3-4 Injection technique: single-shot Needle Needle type: Pencan  Needle gauge: 24 G Needle length: 9 cm Needle insertion depth: 9 cm Assessment Sensory level: T6 Additional Notes Kit expiration date checked and verified.  Sterile prep and drape, skin local with 1% lidocaine, - paraesthesia, - heme, + CSF pre and post injection.  Patient tolerated procedure well.

## 2018-06-25 NOTE — Plan of Care (Signed)
Pt alert and oriented, up from PACU. Family at the bedside. Pain well controlled at this time. RN will monitor.

## 2018-06-25 NOTE — Discharge Instructions (Addendum)
° °Dr. Frank Aluisio °Total Joint Specialist °[facility] °[address] °[place],  [address] °[phone] ° °TOTAL KNEE REPLACEMENT POSTOPERATIVE DIRECTIONS ° °Knee Rehabilitation, Guidelines Following Surgery  °Results after knee surgery are often greatly improved when you follow the exercise, range of motion and muscle strengthening exercises prescribed by your doctor. Safety measures are also important to protect the knee from further injury. Any time any of these exercises cause you to have increased pain or swelling in your knee joint, decrease the amount until you are comfortable again and slowly increase them. If you have problems or questions, call your caregiver or physical therapist for advice.  ° °HOME CARE INSTRUCTIONS  °Remove items at home which could result in a fall. This includes throw rugs or furniture in walking pathways.  °· ICE to the affected knee every three hours for 30 minutes at a time and then as needed for pain and swelling.  Continue to use ice on the knee for pain and swelling from surgery. You may notice swelling that will progress down to the foot and ankle.  This is normal after surgery.  Elevate the leg when you are not up walking on it.   °· Continue to use the breathing machine which will help keep your temperature down.  It is common for your temperature to cycle up and down following surgery, especially at night when you are not up moving around and exerting yourself.  The breathing machine keeps your lungs expanded and your temperature down. °· Do not place pillow under knee, focus on keeping the knee straight while resting ° °DIET °You may resume your previous home diet once your are discharged from the hospital. ° °DRESSING / WOUND CARE / SHOWERING °You may shower 3 days after surgery, but keep the wounds dry during showering.  You may use an occlusive plastic wrap (Press'n Seal for example), NO SOAKING/SUBMERGING IN THE BATHTUB.  If the bandage gets  wet, change with a clean dry gauze.  If the incision gets wet, pat the wound dry with a clean towel. °You may start showering once you are discharged home but do not submerge the incision under water. Just pat the incision dry and apply a dry gauze dressing on daily. °Change the surgical dressing daily and reapply a dry dressing each time. ° °ACTIVITY °Walk with your walker as instructed. °Use walker as long as suggested by your caregivers. °Avoid periods of inactivity such as sitting longer than an hour when not asleep. This helps prevent blood clots.  °You may resume a sexual relationship in one month or when given the OK by your doctor.  °You may return to work once you are cleared by your doctor.  °Do not drive a car for 6 weeks or until released by you surgeon.  °Do not drive while taking narcotics. ° °WEIGHT BEARING °Weight bearing as tolerated with assist device (walker, cane, etc) as directed, use it as long as suggested by your surgeon or therapist, typically at least 4-6 weeks. ° °POSTOPERATIVE CONSTIPATION PROTOCOL °Constipation - defined medically as fewer than three stools per week and severe constipation as less than one stool per week. ° °One of the most common issues patients have following surgery is constipation.  Even if you have a regular bowel pattern at home, your normal regimen is likely to be disrupted due to multiple reasons following surgery.  Combination of anesthesia, postoperative narcotics, change in appetite and fluid intake all can affect your bowels.    In order to avoid complications following surgery, here are some recommendations in order to help you during your recovery period. ° °Colace (docusate) - Pick up an over-the-counter form of Colace or another stool softener and take twice a day as long as you are requiring postoperative pain medications.  Take with a full glass of water daily.  If you experience loose stools or diarrhea, hold the colace until you stool forms back up.  If  your symptoms do not get better within 1 week or if they get worse, check with your doctor. ° °Dulcolax (bisacodyl) - Pick up over-the-counter and take as directed by the product packaging as needed to assist with the movement of your bowels.  Take with a full glass of water.  Use this product as needed if not relieved by Colace only.  ° °MiraLax (polyethylene glycol) - Pick up over-the-counter to have on hand.  MiraLax is a solution that will increase the amount of water in your bowels to assist with bowel movements.  Take as directed and can mix with a glass of water, juice, soda, coffee, or tea.  Take if you go more than two days without a movement. °Do not use MiraLax more than once per day. Call your doctor if you are still constipated or irregular after using this medication for 7 days in a row. ° °If you continue to have problems with postoperative constipation, please contact the office for further assistance and recommendations.  If you experience "the worst abdominal pain ever" or develop nausea or vomiting, please contact the office immediatly for further recommendations for treatment. ° °ITCHING ° If you experience itching with your medications, try taking only a single pain pill, or even half a pain pill at a time.  You can also use Benadryl over the counter for itching or also to help with sleep.  ° °TED HOSE STOCKINGS °Wear the elastic stockings on both legs for three weeks following surgery during the day but you may remove then at night for sleeping. ° °MEDICATIONS °See your medication summary on the “After Visit Summary” that the nursing staff will review with you prior to discharge.  You may have some home medications which will be placed on hold until you complete the course of blood thinner medication.  It is important for you to complete the blood thinner medication as prescribed by your surgeon.  Continue your approved medications as instructed at time of discharge. ° °PRECAUTIONS °If you  experience chest pain or shortness of breath - call 911 immediately for transfer to the hospital emergency department.  °If you develop a fever greater that 101 F, purulent drainage from wound, increased redness or drainage from wound, foul odor from the wound/dressing, or calf pain - CONTACT YOUR SURGEON.   °                                                °FOLLOW-UP APPOINTMENTS °Make sure you keep all of your appointments after your operation with your surgeon and caregivers. You should call the office at the above phone number and make an appointment for approximately two weeks after the date of your surgery or on the date instructed by your surgeon outlined in the "After Visit Summary". ° ° °RANGE OF MOTION AND STRENGTHENING EXERCISES  °Rehabilitation of the knee is important following a knee injury or   an operation. After just a few days of immobilization, the muscles of the thigh which control the knee become weakened and shrink (atrophy). Knee exercises are designed to build up the tone and strength of the thigh muscles and to improve knee motion. Often times heat used for twenty to thirty minutes before working out will loosen up your tissues and help with improving the range of motion but do not use heat for the first two weeks following surgery. These exercises can be done on a training (exercise) mat, on the floor, on a table or on a bed. Use what ever works the best and is most comfortable for you Knee exercises include:  °Leg Lifts - While your knee is still immobilized in a splint or cast, you can do straight leg raises. Lift the leg to 60 degrees, hold for 3 sec, and slowly lower the leg. Repeat 10-20 times 2-3 times daily. Perform this exercise against resistance later as your knee gets better.  °Quad and Hamstring Sets - Tighten up the muscle on the front of the thigh (Quad) and hold for 5-10 sec. Repeat this 10-20 times hourly. Hamstring sets are done by pushing the foot backward against an object  and holding for 5-10 sec. Repeat as with quad sets.  °· Leg Slides: Lying on your back, slowly slide your foot toward your buttocks, bending your knee up off the floor (only go as far as is comfortable). Then slowly slide your foot back down until your leg is flat on the floor again. °· Angel Wings: Lying on your back spread your legs to the side as far apart as you can without causing discomfort.  °A rehabilitation program following serious knee injuries can speed recovery and prevent re-injury in the future due to weakened muscles. Contact your doctor or a physical therapist for more information on knee rehabilitation.  ° °IF YOU ARE TRANSFERRED TO A SKILLED REHAB FACILITY °If the patient is transferred to a skilled rehab facility following release from the hospital, a list of the current medications will be sent to the facility for the patient to continue.  When discharged from the skilled rehab facility, please have the facility set up the patient's Home Health Physical Therapy prior to being released. Also, the skilled facility will be responsible for providing the patient with their medications at time of release from the facility to include their pain medication, the muscle relaxants, and their blood thinner medication. If the patient is still at the rehab facility at time of the two week follow up appointment, the skilled rehab facility will also need to assist the patient in arranging follow up appointment in our office and any transportation needs. ° °MAKE SURE YOU:  °Understand these instructions.  °Get help right away if you are not doing well or get worse.  ° ° °Pick up stool softner and laxative for home use following surgery while on pain medications. °Do not submerge incision under water. °Please use good hand washing techniques while changing dressing each day. °May shower starting three days after surgery. °Please use a clean towel to pat the incision dry following showers. °Continue to use ice for  pain and swelling after surgery. °Do not use any lotions or creams on the incision until instructed by your surgeon. ° °Information on my medicine - XARELTO® (Rivaroxaban) ° ° ° °Why was Xarelto® prescribed for you? °Xarelto® was prescribed for you to reduce the risk of blood clots forming after orthopedic surgery. The medical term for   these abnormal blood clots is venous thromboembolism (VTE). ° °What do you need to know about xarelto® ? °Take your Xarelto® ONCE DAILY at the same time every day. °You may take it either with or without food. ° °If you have difficulty swallowing the tablet whole, you may crush it and mix in applesauce just prior to taking your dose. ° °Take Xarelto® exactly as prescribed by your doctor and DO NOT stop taking Xarelto® without talking to the doctor who prescribed the medication.  Stopping without other VTE prevention medication to take the place of Xarelto® may increase your risk of developing a clot. ° °After discharge, you should have regular check-up appointments with your healthcare provider that is prescribing your Xarelto®.   ° °What do you do if you miss a dose? °If you miss a dose, take it as soon as you remember on the same day then continue your regularly scheduled once daily regimen the next day. Do not take two doses of Xarelto® on the same day.  ° °Important Safety Information °A possible side effect of Xarelto® is bleeding. You should call your healthcare provider right away if you experience any of the following: °? Bleeding from an injury or your nose that does not stop. °? Unusual colored urine (red or dark brown) or unusual colored stools (red or black). °? Unusual bruising for unknown reasons. °? A serious fall or if you hit your head (even if there is no bleeding). ° °Some medicines may interact with Xarelto® and might increase your risk of bleeding while on Xarelto®. To help avoid this, consult your healthcare provider or pharmacist prior to using any new  prescription or non-prescription medications, including herbals, vitamins, non-steroidal anti-inflammatory drugs (NSAIDs) and supplements. ° °This website has more information on Xarelto®: www.xarelto.com. ° ° °

## 2018-06-25 NOTE — Op Note (Signed)
OPERATIVE REPORT-TOTAL KNEE ARTHROPLASTY   Pre-operative diagnosis- Osteoarthritis  Right knee(s)  Post-operative diagnosis- Osteoarthritis Right knee(s)  Procedure-  Right  Total Knee Arthroplasty  Surgeon- Dione Plover. Abigail Teall, MD  Assistant- Ardeen Jourdain, PA-C   Anesthesia-  GA combined with regional for post-op pain  EBL-100 mL   Drains Hemovac  Tourniquet time-  Total Tourniquet Time Documented: Thigh (Right) - 47 minutes Total: Thigh (Right) - 47 minutes     Complications- None  Condition-PACU - hemodynamically stable.   Brief Clinical Note  Joshua Moran. is a 73 y.o. year old male with end stage OA of his right knee with progressively worsening pain and dysfunction. He has constant pain, with activity and at rest and significant functional deficits with difficulties even with ADLs. He has had extensive non-op management including analgesics, injections of cortisone and viscosupplements, and home exercise program, but remains in significant pain with significant dysfunction. Radiographs show bone on bone arthritis medial and patellofemoral. He presents now for right Total Knee Arthroplasty.    Procedure in detail---   The patient is brought into the operating room and positioned supine on the operating table. After successful administration of  GA combined with regional for post-op pain,   a tourniquet is placed high on the  Right thigh(s) and the lower extremity is prepped and draped in the usual sterile fashion. Time out is performed by the operating team and then the  Right lower extremity is wrapped in Esmarch, knee flexed and the tourniquet inflated to 300 mmHg.       A midline incision is made with a ten blade through the subcutaneous tissue to the level of the extensor mechanism. A fresh blade is used to make a medial parapatellar arthrotomy. Soft tissue over the proximal medial tibia is subperiosteally elevated to the joint line with a knife and into the  semimembranosus bursa with a Cobb elevator. Soft tissue over the proximal lateral tibia is elevated with attention being paid to avoiding the patellar tendon on the tibial tubercle. The patella is everted, knee flexed 90 degrees and the ACL and PCL are removed. Findings are bone on bone medial and patellofemoral with massive global osteophytes        The drill is used to create a starting hole in the distal femur and the canal is thoroughly irrigated with sterile saline to remove the fatty contents. The 5 degree Right  valgus alignment guide is placed into the femoral canal and the distal femoral cutting block is pinned to remove 10 mm off the distal femur. Resection is made with an oscillating saw.      The tibia is subluxed forward and the menisci are removed. The extramedullary alignment guide is placed referencing proximally at the medial aspect of the tibial tubercle and distally along the second metatarsal axis and tibial crest. The block is pinned to remove 29mm off the more deficient medial  side. Resection is made with an oscillating saw. Size 8is the most appropriate size for the tibia and the proximal tibia is prepared with the modular drill and keel punch for that size.      The femoral sizing guide is placed and size 8 is most appropriate. Rotation is marked off the epicondylar axis and confirmed by creating a rectangular flexion gap at 90 degrees. The size 8 cutting block is pinned in this rotation and the anterior, posterior and chamfer cuts are made with the oscillating saw. The intercondylar block is then placed and  that cut is made.      Trial size 8 tibial component, trial size 8 posterior stabilized femur and a 8  mm posterior stabilized rotating platform insert trial is placed. Full extension is achieved with excellent varus/valgus and anterior/posterior balance throughout full range of motion. The patella is everted and thickness measured to be 27  mm. Free hand resection is taken to 15 mm,  a 41 template is placed, lug holes are drilled, trial patella is placed, and it tracks normally. Osteophytes are removed off the posterior femur with the trial in place. All trials are removed and the cut bone surfaces prepared with pulsatile lavage. Cement is mixed and once ready for implantation, the size 8 tibial implant, size  8 posterior stabilized femoral component, and the size 41 patella are cemented in place and the patella is held with the clamp. The trial insert is placed and the knee held in full extension. The Exparel (20 ml mixed with 60 ml saline) is injected into the extensor mechanism, posterior capsule, medial and lateral gutters and subcutaneous tissues.  All extruded cement is removed and once the cement is hard the permanent 8 mm posterior stabilized rotating platform insert is placed into the tibial tray.      The wound is copiously irrigated with saline solution and the extensor mechanism closed over a hemovac drain with #1 V-loc suture. The tourniquet is released for a total tourniquet time of 47  minutes. Flexion against gravity is 140 degrees and the patella tracks normally. Subcutaneous tissue is closed with 2.0 vicryl and subcuticular with running 4.0 Monocryl. The incision is cleaned and dried and steri-strips and a bulky sterile dressing are applied. The limb is placed into a knee immobilizer and the patient is awakened and transported to recovery in stable condition.      Please note that a surgical assistant was a medical necessity for this procedure in order to perform it in a safe and expeditious manner. Surgical assistant was necessary to retract the ligaments and vital neurovascular structures to prevent injury to them and also necessary for proper positioning of the limb to allow for anatomic placement of the prosthesis.   Dione Plover Sayge Salvato, MD    06/25/2018, 4:49 PM

## 2018-06-25 NOTE — Interval H&P Note (Signed)
History and Physical Interval Note:  06/25/2018 12:52 PM  Joshua Moran.  has presented today for surgery, with the diagnosis of right knee osteoarthritis  The various methods of treatment have been discussed with the patient and family. After consideration of risks, benefits and other options for treatment, the patient has consented to  Procedure(s) with comments: TOTAL KNEE ARTHROPLASTY (Right) - 24min as a surgical intervention .  The patient's history has been reviewed, patient examined, no change in status, stable for surgery.  I have reviewed the patient's chart and labs.  Questions were answered to the patient's satisfaction.     Pilar Plate Bekka Qian

## 2018-06-25 NOTE — Transfer of Care (Signed)
Immediate Anesthesia Transfer of Care Note  Patient: Joshua Moran.  Procedure(s) Performed: TOTAL KNEE ARTHROPLASTY (Right )  Patient Location: PACU  Anesthesia Type:General and Regional  Level of Consciousness: awake, alert  and oriented  Airway & Oxygen Therapy: Patient Spontanous Breathing and Patient connected to face mask oxygen  Post-op Assessment: Report given to RN and Post -op Vital signs reviewed and stable  Post vital signs: Reviewed and stable  Last Vitals:  Vitals Value Taken Time  BP    Temp    Pulse 102 06/25/2018  5:08 PM  Resp 12 06/25/2018  5:08 PM  SpO2 96 % 06/25/2018  5:08 PM  Vitals shown include unvalidated device data.  Last Pain:  Vitals:   06/25/18 1247  TempSrc:   PainSc: 0-No pain         Complications: No apparent anesthesia complications

## 2018-06-26 ENCOUNTER — Encounter (HOSPITAL_COMMUNITY): Payer: Self-pay | Admitting: Orthopedic Surgery

## 2018-06-26 LAB — CBC
HEMATOCRIT: 38.2 % — AB (ref 39.0–52.0)
Hemoglobin: 12.2 g/dL — ABNORMAL LOW (ref 13.0–17.0)
MCH: 30.2 pg (ref 26.0–34.0)
MCHC: 31.9 g/dL (ref 30.0–36.0)
MCV: 94.6 fL (ref 80.0–100.0)
Platelets: 173 10*3/uL (ref 150–400)
RBC: 4.04 MIL/uL — ABNORMAL LOW (ref 4.22–5.81)
RDW: 13.8 % (ref 11.5–15.5)
WBC: 9.8 10*3/uL (ref 4.0–10.5)
nRBC: 0 % (ref 0.0–0.2)

## 2018-06-26 LAB — BASIC METABOLIC PANEL
Anion gap: 6 (ref 5–15)
BUN: 13 mg/dL (ref 8–23)
CO2: 23 mmol/L (ref 22–32)
Calcium: 8.2 mg/dL — ABNORMAL LOW (ref 8.9–10.3)
Chloride: 107 mmol/L (ref 98–111)
Creatinine, Ser: 0.8 mg/dL (ref 0.61–1.24)
GFR calc Af Amer: >60 mL/min (ref >60)
GFR calc non Af Amer: >60 mL/min (ref >60)
Glucose, Bld: 150 mg/dL — ABNORMAL HIGH (ref 70–99)
Potassium: 4.2 mmol/L (ref 3.5–5.1)
Sodium: 136 mmol/L (ref 135–145)

## 2018-06-26 MED ORDER — SODIUM CHLORIDE 0.9 % IV BOLUS
500.0000 mL | Freq: Once | INTRAVENOUS | Status: AC
  Administered 2018-06-26: 500 mL via INTRAVENOUS

## 2018-06-26 NOTE — Progress Notes (Signed)
   06/26/18 1400  PT Visit Information  Last PT Received On 06/26/18   BP improved at 138/71 this pm, pt able to amb in hallway, he is hopeful to d/c tomorrow  Assistance Needed +1  History of Present Illness s/p R TKA; PMHx: L TKA, obesity  Subjective Data  Patient Stated Goal less knee pain  Precautions  Precautions Knee  Required Braces or Orthoses Knee Immobilizer - Right  Knee Immobilizer - Right Discontinue once straight leg raise with < 10 degree lag  Restrictions  Weight Bearing Restrictions No  Other Position/Activity Restrictions WBAT  Pain Assessment  Pain Assessment 0-10  Pain Score 3  Pain Location right knee  Pain Descriptors / Indicators Grimacing  Pain Intervention(s) Limited activity within patient's tolerance;Monitored during session;Premedicated before session;Repositioned;Ice applied  Cognition  Arousal/Alertness Awake/alert  Behavior During Therapy WFL for tasks assessed/performed  Overall Cognitive Status Within Functional Limits for tasks assessed  Bed Mobility  Overal bed mobility Needs Assistance  Bed Mobility Sit to Supine  Sit to supine Supervision  General bed mobility comments for safety  Transfers  Overall transfer level Needs assistance  Equipment used Rolling walker (2 wheeled)  Transfers Sit to/from Stand  Sit to Stand Min assist  General transfer comment cues for hand placement and RLE management  Ambulation/Gait  Ambulation/Gait assistance Min assist;Min guard  Gait Distance (Feet) 55 Feet  Assistive device Rolling walker (2 wheeled)  Gait Pattern/deviations Step-to pattern  General Gait Details deferred d/t low BP  Gait velocity cues for sequence, chair follow for safety  Total Joint Exercises  Ankle Circles/Pumps AROM;Both;10 reps  Quad Sets AROM;10 reps;Both  PT - End of Session  Equipment Utilized During Treatment Gait belt  Activity Tolerance Patient tolerated treatment well  Patient left with call bell/phone within reach;with  family/visitor present;in bed   PT - Assessment/Plan  PT Plan Current plan remains appropriate  PT Visit Diagnosis Difficulty in walking, not elsewhere classified (R26.2)  PT Frequency (ACUTE ONLY) 7X/week  Follow Up Recommendations Follow surgeon's recommendation for DC plan and follow-up therapies  PT equipment None recommended by PT  AM-PAC PT "6 Clicks" Mobility Outcome Measure (Version 2)  Help needed turning from your back to your side while in a flat bed without using bedrails? 3  Help needed moving from lying on your back to sitting on the side of a flat bed without using bedrails? 3  Help needed moving to and from a bed to a chair (including a wheelchair)? 3  Help needed standing up from a chair using your arms (e.g., wheelchair or bedside chair)? 3  Help needed to walk in hospital room? 3  Help needed climbing 3-5 steps with a railing?  2  6 Click Score 17  Consider Recommendation of Discharge To: Home with [facility]  PT Goal Progression  Progress towards PT goals Progressing toward goals  Acute Rehab PT Goals  PT Goal Formulation With patient  Time For Goal Achievement 07/03/18  Potential to Achieve Goals Good  PT Time Calculation  PT Start Time (ACUTE ONLY) 1423  PT Stop Time (ACUTE ONLY) 1439  PT Time Calculation (min) (ACUTE ONLY) 16 min  PT General Charges  $$ ACUTE PT VISIT 1 Visit  PT Treatments  $Gait Training 8-22 mins

## 2018-06-26 NOTE — Evaluation (Signed)
Physical Therapy Evaluation Patient Details Name: Joshua Moran. MRN: [id number] DOB: 07-Aug-1945 Today's Date: 06/26/2018   History of Present Illness  s/p R TKA; PMHx: L TKA, obesity  Clinical Impression  Pt is s/p TKA resulting in the deficits listed below (see PT Problem List).  Pt very agreeable to PT however mobility/gait limited by dizziness; reviewed HEP and pt feels he is moving Right knee better than he did after L TKA; RN notified of low BP, will follow and progress as medical issues allow  Pt will benefit from skilled PT to increase their independence and safety with mobility to allow discharge to the venue listed below.      Follow Up Recommendations Follow surgeon's recommendation for DC plan and follow-up therapies    Equipment Recommendations  None recommended by PT    Recommendations for Other Services       Precautions / Restrictions Precautions Precautions: Knee Required Braces or Orthoses: Knee Immobilizer - Right Knee Immobilizer - Right: Discontinue once straight leg raise with < 10 degree lag Restrictions Weight Bearing Restrictions: No Other Position/Activity Restrictions: WBAT      Mobility  Bed Mobility Overal bed mobility: Needs Assistance Bed Mobility: Supine to Sit     Supine to sit: HOB elevated;Min guard     General bed mobility comments: min/guard for safety. mild dizziness in sitting  Transfers Overall transfer level: Needs assistance Equipment used: Rolling walker (2 wheeled) Transfers: Sit to/from [facility] Sit to Stand: Min guard;From elevated surface Stand pivot transfers: Min assist       General transfer comment: cues for hand placement and RLE management, dizziness in standing; had pt return to sitting position for safety--BP 91/61 (RN notified); stand pivot after seated rest and dizziness resolved  Ambulation/Gait             General Gait Details: deferred d/t low BP  Stairs             Wheelchair Mobility    Modified Rankin (Stroke Patients Only)       Balance                                             Pertinent Vitals/Pain Pain Assessment: 0-10 Pain Score: 1  Pain Location: right knee Pain Descriptors / Indicators: Grimacing Pain Intervention(s): Monitored during session;Limited activity within patient's tolerance;Repositioned;Premedicated before session    Home Living Family/patient expects to be discharged to:: Private residence Living Arrangements: Spouse/significant other   Type of Home: House Home Access: Stairs to enter   Technical brewer of Steps: 2 and 1 Home Layout: One level Home Equipment: Environmental consultant - 2 wheels;Bedside commode;Shower seat      Prior Function Level of Independence: Independent               Hand Dominance        Extremity/Trunk Assessment   Upper Extremity Assessment Upper Extremity Assessment: Overall WFL for tasks assessed    Lower Extremity Assessment Lower Extremity Assessment: RLE deficits/detail RLE Deficits / Details: ankle WFL, knee extension and hip flexion grossly 3/5, limited by post op soreness       Communication   Communication: No difficulties  Cognition Arousal/Alertness: Awake/alert Behavior During Therapy: WFL for tasks assessed/performed Overall Cognitive Status: Within Functional Limits for tasks assessed  General Comments      Exercises Total Joint Exercises Ankle Circles/Pumps: AROM;Both;10 reps Quad Sets: AROM;10 reps;Both Heel Slides: AAROM;AROM;Right;10 reps Hip ABduction/ADduction: AROM;10 reps;Right Straight Leg Raises: AROM;Right;10 reps Goniometric ROM: grossly -8* to 75* right knee flexion AAROM   Assessment/Plan    PT Assessment Patient needs continued PT services  PT Problem List Decreased strength;Decreased range of motion;Decreased activity tolerance;Decreased mobility;Decreased  knowledge of use of DME;Decreased knowledge of precautions;Pain       PT Treatment Interventions Gait training;DME instruction;Functional mobility training;Patient/family education;Therapeutic activities;Therapeutic exercise;Stair training    PT Goals (Current goals can be found in the Care Plan section)  Acute Rehab PT Goals Patient Stated Goal: less knee pain PT Goal Formulation: With patient Time For Goal Achievement: 07/03/18 Potential to Achieve Goals: Good    Frequency 7X/week   Barriers to discharge        Co-evaluation               AM-PAC PT "6 Clicks" Mobility  Outcome Measure Help needed turning from your back to your side while in a flat bed without using bedrails?: A Little Help needed moving from lying on your back to sitting on the side of a flat bed without using bedrails?: A Little Help needed moving to and from a bed to a chair (including a wheelchair)?: A Little Help needed standing up from a chair using your arms (e.g., wheelchair or bedside chair)?: A Little Help needed to walk in hospital room?: A Little Help needed climbing 3-5 steps with a railing? : A Lot 6 Click Score: 17    End of Session Equipment Utilized During Treatment: Gait belt Activity Tolerance: Patient tolerated treatment well Patient left: in chair;with call bell/phone within reach;with chair alarm set;with family/visitor present   PT Visit Diagnosis: Difficulty in walking, not elsewhere classified (R26.2)    Time: 3614-4315 PT Time Calculation (min) (ACUTE ONLY): 28 min   Charges:   PT Evaluation $PT Eval Low Complexity: 1 Low PT Treatments $Therapeutic Exercise: 8-22 mins        Kenyon Ana, PT  Pager: [phone] Acute Rehab Dept [facility]): [phone]   06/26/2018   [facility] 06/26/2018, 12:04 PM

## 2018-06-26 NOTE — Progress Notes (Signed)
   Subjective: 1 Day Post-Op Procedure(s) (LRB): TOTAL KNEE ARTHROPLASTY (Right) Patient reports pain as mild.   Patient seen in rounds by Dr. Wynelle Link. Patient is well, but has had some minor complaints of feeling lightheaded last night after attempting to stand. Denies chest pain, SOB or calf pain. Foley catheter removed this AM.  We will start therapy today.   Objective: Vital signs in last 24 hours: Temp:  [97.5 F (36.4 C)-98.3 F (36.8 C)] 98.3 F (36.8 C) (01/21 0230) Pulse Rate:  [76-107] 95 (01/21 0703) Resp:  [10-19] 15 (01/21 0703) BP: (107-156)/(70-104) 107/81 (01/21 0703) SpO2:  [95 %-99 %] 98 % (01/21 0703) Weight:  [956 kg] 127 kg (01/20 1811)  Intake/Output from previous day:  Intake/Output Summary (Last 24 hours) at 06/26/2018 0723 Last data filed at 06/26/2018 0535 Gross per 24 hour  Intake 2442.13 ml  Output 3575 ml  Net -1132.87 ml    Labs: Recent Labs    06/26/18 0437  HGB 12.2*   Recent Labs    06/26/18 0437  WBC 9.8  RBC 4.04*  HCT 38.2*  PLT 173   Recent Labs    06/26/18 0437  NA 136  K 4.2  CL 107  CO2 23  BUN 13  CREATININE 0.80  GLUCOSE 150*  CALCIUM 8.2*   Exam: General - Patient is Alert and Oriented Extremity - Neurologically intact Neurovascular intact Sensation intact distally Dorsiflexion/Plantar flexion intact Dressing - dressing C/D/I Motor Function - intact, moving foot and toes well on exam.   Past Medical History:  Diagnosis Date  . Abnormal screening CT of chest    coronary calcium in left main and RCA  . Anemia   . Arthritis   . Complication of anesthesia    Pt reports difficult urinating  . Dyspnea    with some activity  . Dysrhythmia    a- fib   . GERD (gastroesophageal reflux disease)    occasional  . Glaucoma   . Hyperlipidemia    statin intolerant    Assessment/Plan: 1 Day Post-Op Procedure(s) (LRB): TOTAL KNEE ARTHROPLASTY (Right) Principal Problem:   OA (osteoarthritis) of  knee  Estimated body mass index is 37.97 kg/m as calculated from the following:   Height as of this encounter: 6' (1.829 m).   Weight as of this encounter: 127 kg. Advance diet Up with therapy  Anticipated LOS equal to or greater than 2 midnights due to - Age 74 and older with one or more of the following:  - Obesity  - Expected need for hospital services (PT, OT, Nursing) required for safe  discharge  - Anticipated need for postoperative skilled nursing care or inpatient rehab  - Active co-morbidities: Cardiac Arrhythmia OR   - Unanticipated findings during/Post Surgery: None  - Patient is a high risk of re-admission due to: None    DVT Prophylaxis - Xarelto Weight bearing as tolerated. D/C O2 and pulse ox and try on room air. Hemovac pulled without difficulty, will begin therapy today.  BP soft this AM and pt with complaints of lightheadedness yesterday, will order 500 mL bolus. Plan is to go Home after hospital stay. Plan for discharge tomorrow as long as meeting goals with therapy.  Theresa Duty, PA-C Orthopedic Surgery 06/26/2018, 7:23 AM

## 2018-06-26 NOTE — Plan of Care (Signed)
  Problem: Clinical Measurements: Goal: Will remain free from infection Outcome: Progressing Goal: Respiratory complications will improve Outcome: Progressing Goal: Cardiovascular complication will be avoided Outcome: Progressing   Problem: Coping: Goal: Level of anxiety will decrease Outcome: Progressing   Problem: Pain Managment: Goal: General experience of comfort will improve Outcome: Progressing   Problem: Safety: Goal: Ability to remain free from injury will improve Outcome: Progressing

## 2018-06-26 NOTE — Care Management Note (Signed)
Case Management Note  Patient Details  Name: Joshua Moran. MRN: [id number] Date of Birth: 03/12/1946  Subjective/Objective:     Spoke with patient at bedside. Confirmed plan for OP PT, already arranged. Has RW and 3n1. [phone]               Action/Plan:   Expected Discharge Date:  06/26/18               Expected Discharge Plan:  OP Rehab  In-House Referral:  NA  Discharge planning Services  CM Consult  Post Acute Care Choice:  NA Choice offered to:  Patient, Spouse  DME Arranged:  N/A DME Agency:  NA  HH Arranged:  NA HH Agency:  NA  Status of Service:  Completed, signed off  If discussed at [place] of Stay Meetings, dates discussed:    Additional Comments:  Guadalupe Maple, RN 06/26/2018, 11:28 AM

## 2018-06-27 LAB — CBC
HCT: 36.1 % — ABNORMAL LOW (ref 39.0–52.0)
Hemoglobin: 11.4 g/dL — ABNORMAL LOW (ref 13.0–17.0)
MCH: 30.1 pg (ref 26.0–34.0)
MCHC: 31.6 g/dL (ref 30.0–36.0)
MCV: 95.3 fL (ref 80.0–100.0)
NRBC: 0 % (ref 0.0–0.2)
Platelets: 187 10*3/uL (ref 150–400)
RBC: 3.79 MIL/uL — AB (ref 4.22–5.81)
RDW: 14.5 % (ref 11.5–15.5)
WBC: 13.3 10*3/uL — ABNORMAL HIGH (ref 4.0–10.5)

## 2018-06-27 LAB — BASIC METABOLIC PANEL
Anion gap: 7 (ref 5–15)
BUN: 17 mg/dL (ref 8–23)
CO2: 23 mmol/L (ref 22–32)
Calcium: 8.4 mg/dL — ABNORMAL LOW (ref 8.9–10.3)
Chloride: 110 mmol/L (ref 98–111)
Creatinine, Ser: 0.82 mg/dL (ref 0.61–1.24)
GFR calc Af Amer: >60 mL/min (ref >60)
GFR calc non Af Amer: >60 mL/min (ref >60)
GLUCOSE: 126 mg/dL — AB (ref 70–99)
Potassium: 4 mmol/L (ref 3.5–5.1)
Sodium: 140 mmol/L (ref 135–145)

## 2018-06-27 MED ORDER — OXYCODONE HCL 5 MG PO TABS: 5.0000 mg | ORAL_TABLET | Freq: Four times a day (QID) | ORAL | 0 refills | Status: DC | PRN

## 2018-06-27 MED ORDER — METHOCARBAMOL 500 MG PO TABS: 500.0000 mg | ORAL_TABLET | Freq: Four times a day (QID) | ORAL | 0 refills | Status: DC | PRN

## 2018-06-27 MED ORDER — RIVAROXABAN 10 MG PO TABS: 10.0000 mg | ORAL_TABLET | Freq: Every day | ORAL | 0 refills | Status: DC

## 2018-06-27 MED ORDER — GABAPENTIN 300 MG PO CAPS: 300.0000 mg | ORAL_CAPSULE | Freq: Three times a day (TID) | ORAL | 0 refills | Status: DC

## 2018-06-27 NOTE — Progress Notes (Signed)
   Subjective: 2 Days Post-Op Procedure(s) (LRB): TOTAL KNEE ARTHROPLASTY (Right) Patient reports pain as mild.   Patient seen in rounds with Dr. Wynelle Link. Patient is well, and has had no acute complaints or problems. States he is ready to go home. Denies chest pain or SOB. No issues overnight. Voiding without difficulty and positive flatus. Pain well controlled with oral medications.  Plan is to go Home after hospital stay.  Objective: Vital signs in last 24 hours: Temp:  [97.3 F (36.3 C)-98.4 F (36.9 C)] 98 F (36.7 C) (01/22 0419) Pulse Rate:  [74-93] 84 (01/22 0419) Resp:  [16-18] 17 (01/22 0419) BP: (107-139)/(71-88) 139/86 (01/22 0419) SpO2:  [93 %-95 %] 93 % (01/22 0419)  Intake/Output from previous day:  Intake/Output Summary (Last 24 hours) at 06/27/2018 0730 Last data filed at 06/27/2018 0600 Gross per 24 hour  Intake 509.66 ml  Output 2350 ml  Net -1840.34 ml    Labs: Recent Labs    06/26/18 0437 06/27/18 0352  HGB 12.2* 11.4*   Recent Labs    06/26/18 0437 06/27/18 0352  WBC 9.8 13.3*  RBC 4.04* 3.79*  HCT 38.2* 36.1*  PLT 173 187   Recent Labs    06/26/18 0437 06/27/18 0352  NA 136 140  K 4.2 4.0  CL 107 110  CO2 23 23  BUN 13 17  CREATININE 0.80 0.82  GLUCOSE 150* 126*  CALCIUM 8.2* 8.4*   Exam: General - Patient is Alert and Oriented Extremity - Neurologically intact Neurovascular intact Sensation intact distally Dorsiflexion/Plantar flexion intact Dressing/Incision - clean, dry, no drainage Motor Function - intact, moving foot and toes well on exam.   Past Medical History:  Diagnosis Date  . Abnormal screening CT of chest    coronary calcium in left main and RCA  . Anemia   . Arthritis   . Complication of anesthesia    Pt reports difficult urinating  . Dyspnea    with some activity  . Dysrhythmia    a- fib   . GERD (gastroesophageal reflux disease)    occasional  . Glaucoma   . Hyperlipidemia    statin intolerant     Assessment/Plan: 2 Days Post-Op Procedure(s) (LRB): TOTAL KNEE ARTHROPLASTY (Right) Principal Problem:   OA (osteoarthritis) of knee  Estimated body mass index is 37.97 kg/m as calculated from the following:   Height as of this encounter: 6' (1.829 m).   Weight as of this encounter: 127 kg. Up with therapy D/C IV fluids  DVT Prophylaxis - Xarelto Weight-bearing as tolerated  Plan for discharge after two sessions of therapy as long as meeting goals. Scheduled for outpatient physical therapy at [facility]. Follow-up at the office in 2 weeks.   Theresa Duty, PA-C Orthopedic Surgery 06/27/2018, 7:30 AM

## 2018-06-27 NOTE — Discharge Summary (Signed)
Physician Discharge Summary   Patient ID: Joshua Moran. MRN: [id number] DOB/AGE: [birth year] 73 y.o.  Admit date: 06/25/2018 Discharge date: 06/27/2018  Primary Diagnosis: Osteoarthritis, right knee   Admission Diagnoses:  Past Medical History:  Diagnosis Date  . Abnormal screening CT of chest    coronary calcium in left main and RCA  . Anemia   . Arthritis   . Complication of anesthesia    Pt reports difficult urinating  . Dyspnea    with some activity  . Dysrhythmia    a- fib   . GERD (gastroesophageal reflux disease)    occasional  . Glaucoma   . Hyperlipidemia    statin intolerant   Discharge Diagnoses:   Principal Problem:   OA (osteoarthritis) of knee  Estimated body mass index is 37.97 kg/m as calculated from the following:   Height as of this encounter: 6' (1.829 m).   Weight as of this encounter: 127 kg.  Procedure:  Procedure(s) (LRB): TOTAL KNEE ARTHROPLASTY (Right)   Consults: None  HPI:  Joshua Moran. is a 73 y.o. year old male with end stage OA of his right knee with progressively worsening pain and dysfunction. He has constant pain, with activity and at rest and significant functional deficits with difficulties even with ADLs. He has had extensive non-op management including analgesics, injections of cortisone and viscosupplements, and home exercise program, but remains in significant pain with significant dysfunction. Radiographs show bone on bone arthritis medial and patellofemoral. He presents now for right Total Knee Arthroplasty.   Laboratory Data: Admission on 06/25/2018, Discharged on 06/27/2018  Component Date Value Ref Range Status  . WBC 06/26/2018 9.8  4.0 - 10.5 K/uL Final  . RBC 06/26/2018 4.04* 4.22 - 5.81 MIL/uL Final  . Hemoglobin 06/26/2018 12.2* 13.0 - 17.0 g/dL Final  . HCT 06/26/2018 38.2* 39.0 - 52.0 % Final  . MCV 06/26/2018 94.6  80.0 - 100.0 fL Final  . MCH 06/26/2018 30.2  26.0 - 34.0 pg Final  . MCHC 06/26/2018 31.9   30.0 - 36.0 g/dL Final  . RDW 06/26/2018 13.8  11.5 - 15.5 % Final  . Platelets 06/26/2018 173  150 - 400 K/uL Final  . nRBC 06/26/2018 0.0  0.0 - 0.2 % Final   Performed at [facility], [place] [address] [place], [place] [address]  . Sodium 06/26/2018 136  135 - 145 mmol/L Final  . Potassium 06/26/2018 4.2  3.5 - 5.1 mmol/L Final  . Chloride 06/26/2018 107  98 - 111 mmol/L Final  . CO2 06/26/2018 23  22 - 32 mmol/L Final  . Glucose, Bld 06/26/2018 150* 70 - 99 mg/dL Final  . BUN 06/26/2018 13  8 - 23 mg/dL Final  . Creatinine, Ser 06/26/2018 0.80  0.61 - 1.24 mg/dL Final  . Calcium 06/26/2018 8.2* 8.9 - 10.3 mg/dL Final  . GFR calc non Af Amer 06/26/2018 >60  >60 mL/min Final  . GFR calc Af Amer 06/26/2018 >60  >60 mL/min Final  . Anion gap 06/26/2018 6  5 - 15 Final   Performed at [facility], [place] [address], [place], [place] [address]  . WBC 06/27/2018 13.3* 4.0 - 10.5 K/uL Final  . RBC 06/27/2018 3.79* 4.22 - 5.81 MIL/uL Final  . Hemoglobin 06/27/2018 11.4* 13.0 - 17.0 g/dL Final  . HCT 06/27/2018 36.1* 39.0 - 52.0 % Final  . MCV 06/27/2018 95.3  80.0 - 100.0 fL Final  . MCH 06/27/2018 30.1  26.0 - 34.0 pg Final  . MCHC 06/27/2018 31.6  30.0 - 36.0 g/dL Final  . RDW 06/27/2018 14.5  11.5 - 15.5 % Final  . Platelets 06/27/2018 187  150 - 400 K/uL Final  . nRBC 06/27/2018 0.0  0.0 - 0.2 % Final   Performed at [facility], [place] [address] [place], [place] [address]  . Sodium 06/27/2018 140  135 - 145 mmol/L Final  . Potassium 06/27/2018 4.0  3.5 - 5.1 mmol/L Final  . Chloride 06/27/2018 110  98 - 111 mmol/L Final  . CO2 06/27/2018 23  22 - 32 mmol/L Final  . Glucose, Bld 06/27/2018 126* 70 - 99 mg/dL Final  . BUN 06/27/2018 17  8 - 23 mg/dL Final  . Creatinine, Ser 06/27/2018 0.82  0.61 - 1.24 mg/dL Final  . Calcium 06/27/2018 8.4* 8.9 - 10.3 mg/dL Final  . GFR calc non Af Amer 06/27/2018 >60  >60 mL/min Final  . GFR calc  Af Amer 06/27/2018 >60  >60 mL/min Final  . Anion gap 06/27/2018 7  5 - 15 Final   Performed at [facility], [place] [address] [place], [place] [address]  Hospital Outpatient Visit on 06/19/2018  Component Date Value Ref Range Status  . aPTT 06/19/2018 32  24 - 36 seconds Final   Performed at [facility], [place] [address], [place], [place] [address]  . WBC 06/19/2018 6.4  4.0 - 10.5 K/uL Final  . RBC 06/19/2018 4.71  4.22 - 5.81 MIL/uL Final  . Hemoglobin 06/19/2018 13.9  13.0 - 17.0 g/dL Final  . HCT 06/19/2018 44.6  39.0 - 52.0 % Final  . MCV 06/19/2018 94.7  80.0 - 100.0 fL Final  . MCH 06/19/2018 29.5  26.0 - 34.0 pg Final  . MCHC 06/19/2018 31.2  30.0 - 36.0 g/dL Final  . RDW 06/19/2018 14.0  11.5 - 15.5 % Final  . Platelets 06/19/2018 203  150 - 400 K/uL Final  . nRBC 06/19/2018 0.0  0.0 - 0.2 % Final   Performed at [facility], [place] [address] [place], [place] [address]  . Sodium 06/19/2018 138  135 - 145 mmol/L Final  . Potassium 06/19/2018 4.5  3.5 - 5.1 mmol/L Final  . Chloride 06/19/2018 107  98 - 111 mmol/L Final  . CO2 06/19/2018 23  22 - 32 mmol/L Final  . Glucose, Bld 06/19/2018 87  70 - 99 mg/dL Final  . BUN 06/19/2018 23  8 - 23 mg/dL Final  . Creatinine, Ser 06/19/2018 0.92  0.61 - 1.24 mg/dL Final  . Calcium 06/19/2018 8.6* 8.9 - 10.3 mg/dL Final  . Total Protein 06/19/2018 6.7  6.5 - 8.1 g/dL Final  . Albumin 06/19/2018 3.8  3.5 - 5.0 g/dL Final  . AST 06/19/2018 22  15 - 41 U/L Final  . ALT 06/19/2018 20  0 - 44 U/L Final  . Alkaline Phosphatase 06/19/2018 61  38 - 126 U/L Final  . Total Bilirubin 06/19/2018 0.9  0.3 - 1.2 mg/dL Final  . GFR calc non Af Amer 06/19/2018 >60  >60 mL/min Final  . GFR calc Af Amer 06/19/2018 >60  >60 mL/min Final  . Anion gap 06/19/2018 8  5 - 15 Final   Performed at [facility], [place] [address] [place],  [address]  . Prothrombin Time 06/19/2018  12.8  11.4 - 15.2 seconds Final  . INR 06/19/2018 0.97   Final  Performed at [facility], [place] [address], [place], [place] [address]  . ABO/RH(D) 06/19/2018 A POS   Final  . Antibody Screen 06/19/2018 NEG   Final  . Sample Expiration 06/19/2018 06/28/2018   Final  . Extend sample reason 06/19/2018    Final                   Value:NO TRANSFUSIONS OR PREGNANCY IN THE PAST 3 MONTHS Performed at [facility], [place] [address], [place], [place] [address]   . MRSA, PCR 06/19/2018 NEGATIVE  NEGATIVE Final  . Staphylococcus aureus 06/19/2018 NEGATIVE  NEGATIVE Final   Comment: (NOTE) The Xpert SA Assay (FDA approved for NASAL specimens in patients 56 years of age and older), is one component of a comprehensive surveillance program. It is not intended to diagnose infection nor to guide or monitor treatment. Performed at [facility], [place] [address] [place], [place] [address]      X-Rays:No results found.  EKG: Orders placed or performed in visit on 04/24/18  . EKG 12-Lead     Hospital Course: Jailyn Langhorst. is a 73 y.o. who was admitted to [facility]. They were brought to the operating room on 06/25/2018 and underwent Procedure(s): TOTAL KNEE ARTHROPLASTY.  Patient tolerated the procedure well and was later transferred to the recovery room and then to the orthopaedic floor for postoperative care. They were given PO and IV analgesics for pain control following their surgery. They were given 24 hours of postoperative antibiotics of  Anti-infectives (From admission, onward)   Start     Dose/Rate Route Frequency Ordered Stop   06/25/18 2200  clindamycin (CLEOCIN) IVPB 600 mg     600 mg 100 mL/hr over 30 Minutes Intravenous Every 6 hours 06/25/18 1805 06/26/18 0438   06/25/18 1430  clindamycin (CLEOCIN) IVPB 900 mg     900 mg 100 mL/hr over 30 Minutes Intravenous  Once 06/25/18 1419 06/25/18 1607   06/25/18 0600   vancomycin (VANCOCIN) 1,500 mg in sodium chloride 0.9 % 500 mL IVPB     1,500 mg 250 mL/hr over 120 Minutes Intravenous On call to O.R. 06/24/18 1008 06/25/18 1405     and started on DVT prophylaxis in the form of Xarelto.   PT and OT were ordered for total joint protocol. Discharge planning consulted to help with postop disposition and equipment needs. Patient had a decent night on the evening of surgery. He did have a minor complaint of feeling lightheaded after attempting to stand with soft BP overnight. A 500 mL bolus was ordered with resolution of symptoms. They started to get up OOB with therapy on POD #1. Hemovac drain was pulled without difficulty on day one. Continued to work with therapy into POD #2. Pt was seen during rounds on day two and was ready to go home pending progress with therapy. Dressing was changed and the incision was clean, dry, and intact with no drainage. Pt worked with therapy for two additional sessions and was meeting their goals. He was discharged to home later that day in stable condition.  Diet: Cardiac diet Activity: WBAT Follow-up: in 2 weeks Disposition: Home with outpatient physical therapy at [facility] Discharged Condition: stable   Discharge Instructions    Call MD / Call 911   Complete by:  As directed    If you experience chest pain or shortness of breath, CALL 911 and be transported to the hospital emergency room.  If  you develope a fever above 101 F, pus (white drainage) or increased drainage or redness at the wound, or calf pain, call your surgeon's office.   Change dressing   Complete by:  As directed    Change the dressing daily with sterile 4 x 4 inch gauze dressing and apply TED hose.   Constipation Prevention   Complete by:  As directed    Drink plenty of fluids.  Prune juice may be helpful.  You may use a stool softener, such as Colace (over the counter) 100 mg twice a day.  Use MiraLax (over the counter) for constipation as needed.   Diet -  low sodium heart healthy   Complete by:  As directed    Discharge instructions   Complete by:  As directed    Dr. Gaynelle Arabian Total Joint Specialist [facility] [address] [place], [place] [address] [phone]  TOTAL KNEE REPLACEMENT POSTOPERATIVE DIRECTIONS  Knee Rehabilitation, Guidelines Following Surgery  Results after knee surgery are often greatly improved when you follow the exercise, range of motion and muscle strengthening exercises prescribed by your doctor. Safety measures are also important to protect the knee from further injury. Any time any of these exercises cause you to have increased pain or swelling in your knee joint, decrease the amount until you are comfortable again and slowly increase them. If you have problems or questions, call your caregiver or physical therapist for advice.   HOME CARE INSTRUCTIONS  Remove items at home which could result in a fall. This includes throw rugs or furniture in walking pathways.  ICE to the affected knee every three hours for 30 minutes at a time and then as needed for pain and swelling.  Continue to use ice on the knee for pain and swelling from surgery. You may notice swelling that will progress down to the foot and ankle.  This is normal after surgery.  Elevate the leg when you are not up walking on it.   Continue to use the breathing machine which will help keep your temperature down.  It is common for your temperature to cycle up and down following surgery, especially at night when you are not up moving around and exerting yourself.  The breathing machine keeps your lungs expanded and your temperature down. Do not place pillow under knee, focus on keeping the knee straight while resting   DIET You may resume your previous home diet once your are discharged from the hospital.  DRESSING / WOUND CARE / SHOWERING You may shower 3 days after surgery, but keep the wounds dry during showering.  You may use an  occlusive plastic wrap (Press'n Seal for example), NO SOAKING/SUBMERGING IN THE BATHTUB.  If the bandage gets wet, change with a clean dry gauze.  If the incision gets wet, pat the wound dry with a clean towel. You may start showering once you are discharged home but do not submerge the incision under water. Just pat the incision dry and apply a dry gauze dressing on daily. Change the surgical dressing daily and reapply a dry dressing each time.  ACTIVITY Walk with your walker as instructed. Use walker as long as suggested by your caregivers. Avoid periods of inactivity such as sitting longer than an hour when not asleep. This helps prevent blood clots.  You may resume a sexual relationship in one month or when given the OK by your doctor.  You may return to work once you are cleared by your  doctor.  Do not drive a car for 6 weeks or until released by you surgeon.  Do not drive while taking narcotics.  WEIGHT BEARING Weight bearing as tolerated with assist device (walker, cane, etc) as directed, use it as long as suggested by your surgeon or therapist, typically at least 4-6 weeks.  POSTOPERATIVE CONSTIPATION PROTOCOL Constipation - defined medically as fewer than three stools per week and severe constipation as less than one stool per week.  One of the most common issues patients have following surgery is constipation.  Even if you have a regular bowel pattern at home, your normal regimen is likely to be disrupted due to multiple reasons following surgery.  Combination of anesthesia, postoperative narcotics, change in appetite and fluid intake all can affect your bowels.  In order to avoid complications following surgery, here are some recommendations in order to help you during your recovery period.  Colace (docusate) - Pick up an over-the-counter form of Colace or another stool softener and take twice a day as long as you are requiring postoperative pain medications.  Take with a full glass  of water daily.  If you experience loose stools or diarrhea, hold the colace until you stool forms back up.  If your symptoms do not get better within 1 week or if they get worse, check with your doctor.  Dulcolax (bisacodyl) - Pick up over-the-counter and take as directed by the product packaging as needed to assist with the movement of your bowels.  Take with a full glass of water.  Use this product as needed if not relieved by Colace only.   MiraLax (polyethylene glycol) - Pick up over-the-counter to have on hand.  MiraLax is a solution that will increase the amount of water in your bowels to assist with bowel movements.  Take as directed and can mix with a glass of water, juice, soda, coffee, or tea.  Take if you go more than two days without a movement. Do not use MiraLax more than once per day. Call your doctor if you are still constipated or irregular after using this medication for 7 days in a row.  If you continue to have problems with postoperative constipation, please contact the office for further assistance and recommendations.  If you experience "the worst abdominal pain ever" or develop nausea or vomiting, please contact the office immediatly for further recommendations for treatment.  ITCHING  If you experience itching with your medications, try taking only a single pain pill, or even half a pain pill at a time.  You can also use Benadryl over the counter for itching or also to help with sleep.   TED HOSE STOCKINGS Wear the elastic stockings on both legs for three weeks following surgery during the day but you may remove then at night for sleeping.  MEDICATIONS See your medication summary on the "After Visit Summary" that the nursing staff will review with you prior to discharge.  You may have some home medications which will be placed on hold until you complete the course of blood thinner medication.  It is important for you to complete the blood thinner medication as prescribed by  your surgeon.  Continue your approved medications as instructed at time of discharge.  PRECAUTIONS If you experience chest pain or shortness of breath - call 911 immediately for transfer to the hospital emergency department.  If you develop a fever greater that 101 F, purulent drainage from wound, increased redness or drainage from wound, foul odor from  the wound/dressing, or calf pain - CONTACT YOUR SURGEON.                                                   FOLLOW-UP APPOINTMENTS Make sure you keep all of your appointments after your operation with your surgeon and caregivers. You should call the office at the above phone number and make an appointment for approximately two weeks after the date of your surgery or on the date instructed by your surgeon outlined in the "After Visit Summary".   RANGE OF MOTION AND STRENGTHENING EXERCISES  Rehabilitation of the knee is important following a knee injury or an operation. After just a few days of immobilization, the muscles of the thigh which control the knee become weakened and shrink (atrophy). Knee exercises are designed to build up the tone and strength of the thigh muscles and to improve knee motion. Often times heat used for twenty to thirty minutes before working out will loosen up your tissues and help with improving the range of motion but do not use heat for the first two weeks following surgery. These exercises can be done on a training (exercise) mat, on the floor, on a table or on a bed. Use what ever works the best and is most comfortable for you Knee exercises include:  Leg Lifts - While your knee is still immobilized in a splint or cast, you can do straight leg raises. Lift the leg to 60 degrees, hold for 3 sec, and slowly lower the leg. Repeat 10-20 times 2-3 times daily. Perform this exercise against resistance later as your knee gets better.  Quad and Hamstring Sets - Tighten up the muscle on the front of the thigh (Quad) and hold for 5-10  sec. Repeat this 10-20 times hourly. Hamstring sets are done by pushing the foot backward against an object and holding for 5-10 sec. Repeat as with quad sets.  Leg Slides: Lying on your back, slowly slide your foot toward your buttocks, bending your knee up off the floor (only go as far as is comfortable). Then slowly slide your foot back down until your leg is flat on the floor again. Angel Wings: Lying on your back spread your legs to the side as far apart as you can without causing discomfort.  A rehabilitation program following serious knee injuries can speed recovery and prevent re-injury in the future due to weakened muscles. Contact your doctor or a physical therapist for more information on knee rehabilitation.   IF YOU ARE TRANSFERRED TO A SKILLED REHAB FACILITY If the patient is transferred to a skilled rehab facility following release from the hospital, a list of the current medications will be sent to the facility for the patient to continue.  When discharged from the skilled rehab facility, please have the facility set up the patient's [place] prior to being released. Also, the skilled facility will be responsible for providing the patient with their medications at time of release from the facility to include their pain medication, the muscle relaxants, and their blood thinner medication. If the patient is still at the rehab facility at time of the two week follow up appointment, the skilled rehab facility will also need to assist the patient in arranging follow up appointment in our office and any transportation needs.  MAKE SURE YOU:  Understand  these instructions.  Get help right away if you are not doing well or get worse.    Pick up stool softner and laxative for home use following surgery while on pain medications. Do not submerge incision under water. Please use good hand washing techniques while changing dressing each day. May shower starting three days  after surgery. Please use a clean towel to pat the incision dry following showers. Continue to use ice for pain and swelling after surgery. Do not use any lotions or creams on the incision until instructed by your surgeon.   Do not put a pillow under the knee. Place it under the heel.   Complete by:  As directed    Driving restrictions   Complete by:  As directed    No driving for two weeks   TED hose   Complete by:  As directed    Use stockings (TED hose) for three weeks on both leg(s).  You may remove them at night for sleeping.   Weight bearing as tolerated   Complete by:  As directed      Allergies as of 06/27/2018      Reactions   Penicillins Other (See Comments)   Very flushed  Has patient had a PCN reaction causing immediate rash, facial/tongue/throat swelling, SOB or lightheadedness with hypotension: No Has patient had a PCN reaction causing severe rash involving mucus membranes or skin necrosis: No Has patient had a PCN reaction that required hospitalization No Has patient had a PCN reaction occurring within the last 10 years: No If all of the above answers are "NO", then may proceed with Cephalosporin use.   Statins Other (See Comments)   Pt c/o muscle aches with use of statins.   Vancomycin Rash      Medication List    STOP taking these medications   aspirin EC 81 MG tablet   COSAMIN DS PO   meloxicam 15 MG tablet Commonly known as:  MOBIC   multivitamin with minerals Tabs tablet   Vitamin D3 50 MCG (2000 UT) Tabs     TAKE these medications   brimonidine 0.15 % ophthalmic solution Commonly known as:  ALPHAGAN Place 1 drop into the right eye 2 (two) times daily.   cetirizine 10 MG tablet Commonly known as:  ZYRTEC Take 10 mg by mouth every evening.   ferrous sulfate 325 (65 FE) MG tablet Take 325 mg by mouth daily with breakfast.   finasteride 5 MG tablet Commonly known as:  PROSCAR Take 5 mg by mouth daily.   fluticasone 50 MCG/ACT nasal  spray Commonly known as:  FLONASE Place 2 sprays into both nostrils 2 (two) times daily.   gabapentin 300 MG capsule Commonly known as:  NEURONTIN Take 1 capsule (300 mg total) by mouth 3 (three) times daily.   methocarbamol 500 MG tablet Commonly known as:  ROBAXIN Take 1 tablet (500 mg total) by mouth every 6 (six) hours as needed for muscle spasms.   oxyCODONE 5 MG immediate release tablet Commonly known as:  Oxy IR/ROXICODONE Take 1-2 tablets (5-10 mg total) by mouth every 6 (six) hours as needed for moderate pain (pain score 4-6).   rivaroxaban 10 MG Tabs tablet Commonly known as:  XARELTO Take 1 tablet (10 mg total) by mouth daily with breakfast for 19 days. Then resume one 81 mg aspirin once a day.   rosuvastatin 5 MG tablet Commonly known as:  CRESTOR Take 1 tablet (5 mg total) by mouth daily at 6  PM. What changed:  when to take this   sildenafil 20 MG tablet Commonly known as:  REVATIO Take 40-100 mg by mouth daily as needed (erectile dysfunction.).   tamsulosin 0.4 MG Caps capsule Commonly known as:  FLOMAX Take 0.4 mg by mouth daily after supper.            Discharge Care Instructions  (From admission, onward)         Start     Ordered   06/26/18 0000  Weight bearing as tolerated     06/26/18 0726   06/26/18 0000  Change dressing    Comments:  Change the dressing daily with sterile 4 x 4 inch gauze dressing and apply TED hose.   06/26/18 0726         Follow-up Information    Gaynelle Arabian, MD. Schedule an appointment as soon as possible for a visit on 07/10/2018.   Specialty:  Orthopedic Surgery Contact information: [address] [place] [address] [phone]           Signed: Theresa Duty, PA-C Orthopedic Surgery 06/27/2018, 4:04 PM

## 2018-06-27 NOTE — Plan of Care (Signed)
  Problem: Clinical Measurements: Goal: Will remain free from infection Outcome: Progressing Goal: Respiratory complications will improve Outcome: Progressing   Problem: Elimination: Goal: Will not experience complications related to urinary retention Outcome: Progressing   Problem: Pain Managment: Goal: General experience of comfort will improve Outcome: Progressing   Problem: Safety: Goal: Ability to remain free from injury will improve Outcome: Progressing

## 2018-06-27 NOTE — Progress Notes (Signed)
Physical Therapy Treatment Patient Details Name: Joshua Moran. MRN: [id number] DOB: 18-Jul-1945 Today's Date: 06/27/2018    History of Present Illness s/p R TKA; PMHx: L TKA, obesity    PT Comments    Pt ambulated in hallway and practiced safe stair technique.  Pt requested second session so will return to perform exercises.   Follow Up Recommendations  Follow surgeon's recommendation for DC plan and follow-up therapies     Equipment Recommendations  None recommended by PT    Recommendations for Other Services       Precautions / Restrictions Precautions Precautions: Knee Required Braces or Orthoses: Knee Immobilizer - Right Knee Immobilizer - Right: Discontinue once straight leg raise with < 10 degree lag Restrictions Weight Bearing Restrictions: No Other Position/Activity Restrictions: WBAT    Mobility  Bed Mobility Overal bed mobility: Needs Assistance Bed Mobility: Supine to Sit     Supine to sit: Supervision     General bed mobility comments: for safety  Transfers Overall transfer level: Needs assistance Equipment used: Rolling walker (2 wheeled) Transfers: Sit to/from Stand Sit to Stand: Min guard         General transfer comment: cues for hand placement and RLE management  Ambulation/Gait Ambulation/Gait assistance: Min guard Gait Distance (Feet): 90 Feet Assistive device: Rolling walker (2 wheeled) Gait Pattern/deviations: Step-to pattern;Decreased stance time - right;Antalgic     General Gait Details: verbal cues for sequence, step length, RW positioning, denies dizziness today   Stairs Stairs: Yes Stairs assistance: Min guard Stair Management: Step to pattern;Backwards;With walker Number of Stairs: 3 General stair comments: verbal cues for sequence, safety, RW positioning, pt reports understanding; also performed one step forwards with RW   Wheelchair Mobility    Modified Rankin (Stroke Patients Only)       Balance                                             Cognition Arousal/Alertness: Awake/alert Behavior During Therapy: WFL for tasks assessed/performed Overall Cognitive Status: Within Functional Limits for tasks assessed                                        Exercises      General Comments        Pertinent Vitals/Pain Pain Assessment: 0-10 Pain Score: 3  Pain Location: right knee Pain Descriptors / Indicators: Sore;Aching Pain Intervention(s): Limited activity within patient's tolerance;Premedicated before session;Monitored during session    Home Living                      Prior Function            PT Goals (current goals can now be found in the care plan section) Progress towards PT goals: Progressing toward goals    Frequency    7X/week      PT Plan Current plan remains appropriate    Co-evaluation              AM-PAC PT "6 Clicks" Mobility   Outcome Measure  Help needed turning from your back to your side while in a flat bed without using bedrails?: A Little Help needed moving from lying on your back to sitting on the side of a flat bed without using  bedrails?: A Little Help needed moving to and from a bed to a chair (including a wheelchair)?: A Little Help needed standing up from a chair using your arms (e.g., wheelchair or bedside chair)?: A Little Help needed to walk in hospital room?: A Little Help needed climbing 3-5 steps with a railing? : A Little 6 Click Score: 18    End of Session Equipment Utilized During Treatment: Gait belt Activity Tolerance: Patient tolerated treatment well Patient left: in chair;with call bell/phone within reach   PT Visit Diagnosis: Difficulty in walking, not elsewhere classified (R26.2)     Time: 0802-2336 PT Time Calculation (min) (ACUTE ONLY): 13 min  Charges:  $Gait Training: 8-22 mins                     Carmelia Bake, PT, [place] Office:  [phone] Pager: Gilbert E 06/27/2018, 11:52 AM

## 2018-06-27 NOTE — Progress Notes (Signed)
Physical Therapy Treatment Patient Details Name: Joshua Moran. MRN: [id number] DOB: November 28, 1945 Today's Date: 06/27/2018    History of Present Illness s/p R TKA; PMHx: L TKA, obesity    PT Comments    Pt performed exercises and provided with HEP handout.   Pt declined needing to practice steps again, feels comfortable and confident.  Pt also declined ambulating again.  Pt and spouse had no further questions and pt feels ready for d/c home.    Follow Up Recommendations  Follow surgeon's recommendation for DC plan and follow-up therapies     Equipment Recommendations  None recommended by PT    Recommendations for Other Services       Precautions / Restrictions Precautions Precautions: Knee Required Braces or Orthoses: Knee Immobilizer - Right Knee Immobilizer - Right: Discontinue once straight leg raise with < 10 degree lag Restrictions Weight Bearing Restrictions: No Other Position/Activity Restrictions: WBAT    Mobility              Balance                                            Cognition Arousal/Alertness: Awake/alert Behavior During Therapy: WFL for tasks assessed/performed Overall Cognitive Status: Within Functional Limits for tasks assessed                                        Exercises Total Joint Exercises Ankle Circles/Pumps: AROM;Both;10 reps Quad Sets: AROM;10 reps;Both Short Arc Quad: AAROM;10 reps;Right Heel Slides: AAROM;10 reps;Right;Supine Hip ABduction/ADduction: AROM;10 reps;Right Straight Leg Raises: Right;10 reps;AAROM Knee Flexion: 10 reps;Seated;AAROM;Right Goniometric ROM: approx 60* AAROM right knee flexion, limited by pain/soreness    General Comments        Pertinent Vitals/Pain Pain Assessment: 0-10 Pain Score: 2  Pain Location: right knee Pain Descriptors / Indicators: Sore;Aching Pain Intervention(s): Premedicated before session;Monitored during session;Limited activity within  patient's tolerance    Home Living                      Prior Function            PT Goals (current goals can now be found in the care plan section) Progress towards PT goals: Progressing toward goals    Frequency    7X/week      PT Plan Current plan remains appropriate    Co-evaluation              AM-PAC PT "6 Clicks" Mobility   Outcome Measure  Help needed turning from your back to your side while in a flat bed without using bedrails?: A Little Help needed moving from lying on your back to sitting on the side of a flat bed without using bedrails?: A Little Help needed moving to and from a bed to a chair (including a wheelchair)?: A Little Help needed standing up from a chair using your arms (e.g., wheelchair or bedside chair)?: A Little Help needed to walk in hospital room?: A Little Help needed climbing 3-5 steps with a railing? : A Little 6 Click Score: 18    End of Session Equipment Utilized During Treatment: Gait belt Activity Tolerance: Patient tolerated treatment well Patient left: in chair;with call bell/phone within reach;with family/visitor present   PT Visit Diagnosis:  Difficulty in walking, not elsewhere classified (R26.2)     Time: 1100-1111 PT Time Calculation (min) (ACUTE ONLY): 11 min  Charges:   $Therapeutic Exercise: 8-22 mins                     Carmelia Bake, PT, DPT Acute Rehabilitation Services Office: [phone] Pager: [phone]  Trena Platt 06/27/2018, 11:56 AM

## 2018-07-02 DIAGNOSIS — M25661 Stiffness of right knee, not elsewhere classified: Secondary | ICD-10-CM | POA: Diagnosis not present

## 2018-07-05 ENCOUNTER — Encounter: Payer: Self-pay | Admitting: *Deleted

## 2018-07-06 DIAGNOSIS — M25661 Stiffness of right knee, not elsewhere classified: Secondary | ICD-10-CM | POA: Diagnosis not present

## 2018-07-06 DIAGNOSIS — J069 Acute upper respiratory infection, unspecified: Secondary | ICD-10-CM | POA: Diagnosis not present

## 2018-07-06 DIAGNOSIS — R21 Rash and other nonspecific skin eruption: Secondary | ICD-10-CM | POA: Diagnosis not present

## 2018-07-09 DIAGNOSIS — M25661 Stiffness of right knee, not elsewhere classified: Secondary | ICD-10-CM | POA: Diagnosis not present

## 2018-07-09 DIAGNOSIS — Z96651 Presence of right artificial knee joint: Secondary | ICD-10-CM | POA: Insufficient documentation

## 2018-07-12 DIAGNOSIS — H33033 Retinal detachment with giant retinal tear, bilateral: Secondary | ICD-10-CM | POA: Diagnosis not present

## 2018-07-12 DIAGNOSIS — H401114 Primary open-angle glaucoma, right eye, indeterminate stage: Secondary | ICD-10-CM | POA: Diagnosis not present

## 2018-07-12 DIAGNOSIS — Z961 Presence of intraocular lens: Secondary | ICD-10-CM | POA: Diagnosis not present

## 2018-07-13 DIAGNOSIS — M25661 Stiffness of right knee, not elsewhere classified: Secondary | ICD-10-CM | POA: Diagnosis not present

## 2018-07-16 DIAGNOSIS — M25661 Stiffness of right knee, not elsewhere classified: Secondary | ICD-10-CM | POA: Diagnosis not present

## 2018-07-19 DIAGNOSIS — M25661 Stiffness of right knee, not elsewhere classified: Secondary | ICD-10-CM | POA: Diagnosis not present

## 2018-07-23 DIAGNOSIS — M25661 Stiffness of right knee, not elsewhere classified: Secondary | ICD-10-CM | POA: Diagnosis not present

## 2018-07-24 ENCOUNTER — Ambulatory Visit (INDEPENDENT_AMBULATORY_CARE_PROVIDER_SITE_OTHER): Payer: PPO | Admitting: Cardiology

## 2018-07-24 ENCOUNTER — Encounter: Payer: Self-pay | Admitting: Cardiology

## 2018-07-24 VITALS — BP 120/76 | HR 86 | Ht 72.0 in | Wt 287.4 lb

## 2018-07-24 DIAGNOSIS — E669 Obesity, unspecified: Secondary | ICD-10-CM | POA: Insufficient documentation

## 2018-07-24 DIAGNOSIS — R931 Abnormal findings on diagnostic imaging of heart and coronary circulation: Secondary | ICD-10-CM | POA: Diagnosis not present

## 2018-07-24 DIAGNOSIS — E785 Hyperlipidemia, unspecified: Secondary | ICD-10-CM

## 2018-07-24 DIAGNOSIS — I480 Paroxysmal atrial fibrillation: Secondary | ICD-10-CM | POA: Diagnosis not present

## 2018-07-24 NOTE — Assessment & Plan Note (Signed)
BMI 38- negative sleep study in the past

## 2018-07-24 NOTE — Progress Notes (Signed)
07/24/2018 Joshua Moran.   [birth year]  [id number]  Primary Physician Lawerance Cruel, MD Primary Cardiologist: Dr Gwenlyn Found  HPI: [name] is a 73 year old male who was referred to Dr. Gwenlyn Found in the past for incidental finding of coronary calcification on a chest CT scan.  He has no history of MI or chest pain.  He has mild dyslipidemia with a history of high-dose statin intolerance.  He has no family history of coronary disease, and never smoked.  He is never had a stroke or TIA.  He had a Myoview in 2016 that was negative for ischemia.  In July 2019 he underwent left knee replacement.  His EKG then showed sinus rhythm.  In November 2019 he was to undergo right knee replacement and his EKG preop showed him to be in atrial fibrillation.  This was a new finding.  As far as we can tell the patient is asymptomatic with this.  He denies any unusual dyspnea with exertion.  He does not have palpitations.  In November when we found him to be in atrial fibrillation Dr. Alvester Chou ordered a Myoview and echocardiogram.  His Myoview was negative for ischemia and his echocardiogram showed normal LV function with a normal left atrium.  He ultimately underwent his knee surgery in January 2020.  He tolerated this well.  He did say that he was put on Xarelto postop and had a rash and it was discontinued.  Dr. Gwenlyn Found is recommended aspirin only for a CHADS VASC score of 1 for his age.  He is in the office today for routine follow-up.  His EKG shows atrial fibrillation with a ventricular response 86.  He remains asymptomatic.   Current Outpatient Medications  Medication Sig Dispense Refill  . aspirin EC 81 MG tablet Take 81 mg by mouth daily.    . brimonidine (ALPHAGAN) 0.15 % ophthalmic solution Place 1 drop into the right eye 2 (two) times daily.    . cetirizine (ZYRTEC) 10 MG tablet Take 10 mg by mouth every evening.    . ferrous sulfate 325 (65 FE) MG tablet Take 325 mg by mouth daily with breakfast.    .  finasteride (PROSCAR) 5 MG tablet Take 5 mg by mouth daily.    . fluticasone (FLONASE) 50 MCG/ACT nasal spray Place 2 sprays into both nostrils 2 (two) times daily.     . sildenafil (REVATIO) 20 MG tablet Take 40-100 mg by mouth daily as needed (erectile dysfunction.).   5  . tamsulosin (FLOMAX) 0.4 MG CAPS capsule Take 0.4 mg by mouth daily after supper.    . rosuvastatin (CRESTOR) 5 MG tablet Take 1 tablet (5 mg total) by mouth daily at 6 PM. (Patient taking differently: Take 5 mg by mouth every Monday, Wednesday, and Friday. ) 90 tablet 3   No current facility-administered medications for this visit.     Allergies  Allergen Reactions  . Xarelto [Rivaroxaban] Rash  . Penicillins Other (See Comments)    Very flushed  Has patient had a PCN reaction causing immediate rash, facial/tongue/throat swelling, SOB or lightheadedness with hypotension: No Has patient had a PCN reaction causing severe rash involving mucus membranes or skin necrosis: No Has patient had a PCN reaction that required hospitalization No Has patient had a PCN reaction occurring within the last 10 years: No If all of the above answers are "NO", then may proceed with Cephalosporin use.   . Statins Other (See Comments)    Pt c/o muscle  aches with use of statins.  . Vancomycin Rash    Past Medical History:  Diagnosis Date  . Abnormal screening CT of chest    coronary calcium in left main and RCA  . Anemia   . Arthritis   . Complication of anesthesia    Pt reports difficult urinating  . Dyspnea    with some activity  . Dysrhythmia    a- fib   . GERD (gastroesophageal reflux disease)    occasional  . Glaucoma   . Hyperlipidemia    statin intolerant    Social History   Socioeconomic History  . Marital status: Married    Spouse name: Not on file  . Number of children: Not on file  . Years of education: Not on file  . Highest education level: Not on file  Occupational History  . Not on file  Social Needs    . Financial resource strain: Not on file  . Food insecurity:    Worry: Not on file    Inability: Not on file  . Transportation needs:    Medical: Not on file    Non-medical: Not on file  Tobacco Use  . Smoking status: Never Smoker  . Smokeless tobacco: Never Used  Substance and Sexual Activity  . Alcohol use: Yes    Alcohol/week: 1.0 standard drinks    Types: 1 Cans of beer per week    Comment: Rare  . Drug use: No  . Sexual activity: Not Currently  Lifestyle  . Physical activity:    Days per week: Not on file    Minutes per session: Not on file  . Stress: Not on file  Relationships  . Social connections:    Talks on phone: Not on file    Gets together: Not on file    Attends religious service: Not on file    Active member of club or organization: Not on file    Attends meetings of clubs or organizations: Not on file    Relationship status: Not on file  . Intimate partner violence:    Fear of current or ex partner: Not on file    Emotionally abused: Not on file    Physically abused: Not on file    Forced sexual activity: Not on file  Other Topics Concern  . Not on file  Social History Narrative  . Not on file     Family History  Problem Relation Age of Onset  . Lung cancer Mother        smoker     Review of Systems: General: negative for chills, fever, night sweats or weight changes.  Cardiovascular: negative for chest pain, dyspnea on exertion, edema, orthopnea, palpitations, paroxysmal nocturnal dyspnea or shortness of breath Dermatological: negative for rash Respiratory: negative for cough or wheezing Urologic: negative for hematuria Abdominal: negative for nausea, vomiting, diarrhea, bright red blood per rectum, melena, or hematemesis Neurologic: negative for visual changes, syncope, or dizziness All other systems reviewed and are otherwise negative except as noted above.    Blood pressure 120/76, pulse 86, height 6' (1.829 m), weight 287 lb 6.4 oz  (130.4 kg).  General appearance: alert, cooperative, no distress and moderately obese Neck: no carotid bruit and no JVD Lungs: clear to auscultation bilaterally Heart: irregularly irregular rhythm Extremities: no edema Skin: Skin color, texture, turgor normal. No rashes or lesions Neurologic: Grossly normal  EKG AF with VR 86- one PVC  ASSESSMENT AND PLAN:   Paroxysmal atrial fibrillation (Joshua Moran) Noted  to be in AF on pre op (knee) anesthesia evaluation Nov 2019 Pt is asymptomatic  Abnormal CT scan of heart Coronary calcium on chest CT Myoview negative for ischemia Nov 2019  Dyslipidemia, goal LDL below 100 On Crestor low dose  Obesity BMI 38- negative sleep study in the past   PLAN  Same Rx for now.  He will need to go on a NOAC when he is 73 y/o-( Eliquis since he had an allergic reaction to Xarelto).  F/U Dr Gwenlyn Found in 6 months  Joshua Moran [facility] 07/24/2018 9:36 AM

## 2018-07-24 NOTE — Assessment & Plan Note (Signed)
Coronary calcium on chest CT Myoview negative for ischemia Nov 2019

## 2018-07-24 NOTE — Assessment & Plan Note (Signed)
Noted to be in AF on pre op (knee) anesthesia evaluation Nov 2019 Pt is asymptomatic

## 2018-07-24 NOTE — Patient Instructions (Signed)
Medication Instructions:  Your physician recommends that you continue on your current medications as directed. Please refer to the Current Medication list given to you today.  If you need a refill on your cardiac medications before your next appointment, please call your pharmacy.   Lab work: None  If you have labs (blood work) drawn today and your tests are completely normal, you will receive your results only by: . MyChart Message (if you have MyChart) OR . A paper copy in the mail If you have any lab test that is abnormal or we need to change your treatment, we will call you to review the results.  Testing/Procedures: None   Follow-Up: At [facility], you and your health needs are our priority.  As part of our continuing mission to provide you with exceptional heart care, we have created designated Provider Care Teams.  These Care Teams include your primary Cardiologist (physician) and Advanced Practice Providers (APPs -  Physician Assistants and Nurse Practitioners) who all work together to provide you with the care you need, when you need it. You will need a follow up appointment in 6 months.  Please call our office 2 months in advance to schedule this appointment.  You may see Jonathan Berry, MD or one of the following Advanced Practice Providers on your designated Care Team:   Luke Kilroy, PA-C Krista Kroeger, PA-C . Callie Goodrich, PA-C  Any Other Special Instructions Will Be Listed Below (If Applicable).   

## 2018-07-24 NOTE — Assessment & Plan Note (Signed)
On Crestor low dose

## 2018-07-26 DIAGNOSIS — M25661 Stiffness of right knee, not elsewhere classified: Secondary | ICD-10-CM | POA: Diagnosis not present

## 2018-07-30 DIAGNOSIS — M25661 Stiffness of right knee, not elsewhere classified: Secondary | ICD-10-CM | POA: Diagnosis not present

## 2018-07-31 DIAGNOSIS — Z96651 Presence of right artificial knee joint: Secondary | ICD-10-CM | POA: Diagnosis not present

## 2018-07-31 DIAGNOSIS — Z471 Aftercare following joint replacement surgery: Secondary | ICD-10-CM | POA: Diagnosis not present

## 2018-11-14 DIAGNOSIS — R7309 Other abnormal glucose: Secondary | ICD-10-CM | POA: Diagnosis not present

## 2018-11-14 DIAGNOSIS — Z131 Encounter for screening for diabetes mellitus: Secondary | ICD-10-CM | POA: Diagnosis not present

## 2018-11-14 DIAGNOSIS — D649 Anemia, unspecified: Secondary | ICD-10-CM | POA: Diagnosis not present

## 2018-11-14 DIAGNOSIS — Z0000 Encounter for general adult medical examination without abnormal findings: Secondary | ICD-10-CM | POA: Diagnosis not present

## 2018-11-19 DIAGNOSIS — H401113 Primary open-angle glaucoma, right eye, severe stage: Secondary | ICD-10-CM | POA: Diagnosis not present

## 2018-11-19 DIAGNOSIS — Z961 Presence of intraocular lens: Secondary | ICD-10-CM | POA: Diagnosis not present

## 2018-12-20 ENCOUNTER — Telehealth: Payer: Self-pay | Admitting: Cardiovascular Disease

## 2018-12-20 NOTE — Telephone Encounter (Signed)

## 2018-12-21 ENCOUNTER — Other Ambulatory Visit: Payer: Self-pay

## 2018-12-21 ENCOUNTER — Encounter: Payer: Self-pay | Admitting: Cardiovascular Disease

## 2018-12-21 ENCOUNTER — Ambulatory Visit: Payer: PPO | Admitting: Cardiovascular Disease

## 2018-12-21 DIAGNOSIS — E785 Hyperlipidemia, unspecified: Secondary | ICD-10-CM

## 2018-12-21 DIAGNOSIS — I480 Paroxysmal atrial fibrillation: Secondary | ICD-10-CM

## 2018-12-21 NOTE — Assessment & Plan Note (Signed)
History of dyslipidemia on Crestor 5 mg every other day with lipid profile performed 03/14/2018 revealing total cholesterol 177, LDL 114 and HDL 48.  12 is not at goal for primary prevention she says that he is statin intolerant at higher doses and frequencies.

## 2018-12-21 NOTE — Patient Instructions (Signed)
Medication Instructions:  Your physician recommends that you continue on your current medications as directed. Please refer to the Current Medication list given to you today.  If you need a refill on your cardiac medications before your next appointment, please call your pharmacy.   Lab work: NONE If you have labs (blood work) drawn today and your tests are completely normal, you will receive your results only by: Marland Kitchen MyChart Message (if you have MyChart) OR . A paper copy in the mail If you have any lab test that is abnormal or we need to change your treatment, we will call you to review the results.  Testing/Procedures: NONE  Follow-Up: At [facility], you and your health needs are our priority.  As part of our continuing mission to provide you with exceptional heart care, we have created designated Provider Care Teams.  These Care Teams include your primary Cardiologist (physician) and Advanced Practice Providers (APPs -  Physician Assistants and Nurse Practitioners) who all work together to provide you with the care you need, when you need it. You will need a follow up appointment in 6 months [place], PA-C AND IN 12 MONTHS WITH DR. Gwenlyn Found.  Please call our office 2 months in advance to schedule [facility] appointment.

## 2018-12-21 NOTE — Assessment & Plan Note (Signed)
History of A. fib rate controlled on aspirin. The CHA2DSVASC2 score is 1  .  This will increase to 2 when he becomes 74 next year and at that point we can discuss initiation of oral anticoagulation for stroke prophylaxis.

## 2018-12-21 NOTE — Progress Notes (Signed)
12/21/2018 Joshua Moran.   30-Apr-1946  [id number]  Primary Physician Lawerance Cruel, MD Primary Cardiologist: Lorretta Harp MD Lupe Carney, [place]  HPI:  Joshua Moran. is a 73 y.o.  moderately overweight married Caucasian male father of 3 children, grandfather of 3 grandchildren referred by Dr. Melinda Crutch for cardiovascular evaluation because of coronary calcium found on chest CT done for other reasons. I last saw him in the office  04/24/2018. He is retired Teacher, English as a foreign language for [employer]. His only cardiac risk factor is mild hyperlipidemia with statin intolerance. There is no family history. He has never smoked. He has never had a heart attack or stroke. He denies chest pain but does have mild dyspnea on exertion probably related to poor exercise tolerance from inactivity. He was seen at the hospital during a UTI and has apparently had asymmetric upper extremity blood pressures. A CT scan done of his chest suggested that he had calcium in his left main and RCA.  A Myoview stress test performed 05/20/2015 was normal and recent 2D echo performed 09/27/2017 showed normal LV systolic function, grade 1 diastolic dysfunction and mild pulmonary hypertension. He is somewhat deconditioned. He requires bilateral knee replacement left first by Dr. Reynaldo Minium which he will be cleared for at low risk. He also has elevated LDL at 153 measured on 08/08/2017 but states that he is statin intolerant.  He was seen by anesthesiology for preop right total knee replacement was found to be in A. fib.  He is unaware of this.  His CHA2DsVASC core is 1.  He is already on a baby aspirin.    I obtained a Myoview stress test 05/15/2018 which was low risk, with apical thinning.  A 2D echo performed 09/27/2017 showed normal LV function with grade 1 diastolic dysfunction.  He gets occasional atypical chest pain.  Current Meds  Medication Sig  . aspirin EC 81 MG tablet Take 81 mg by mouth daily.  .  brimonidine (ALPHAGAN) 0.15 % ophthalmic solution Place 1 drop into the right eye 2 (two) times daily.  . cetirizine (ZYRTEC) 10 MG tablet Take 10 mg by mouth every evening.  . ferrous sulfate 325 (65 FE) MG tablet Take 325 mg by mouth daily with breakfast.  . finasteride (PROSCAR) 5 MG tablet Take 5 mg by mouth daily.  . fluticasone (FLONASE) 50 MCG/ACT nasal spray Place 2 sprays into both nostrils 2 (two) times daily.   . sildenafil (REVATIO) 20 MG tablet Take 40-100 mg by mouth daily as needed (erectile dysfunction.).   Marland Kitchen tamsulosin (FLOMAX) 0.4 MG CAPS capsule Take 0.4 mg by mouth daily after supper.     Allergies  Allergen Reactions  . Xarelto [Rivaroxaban] Rash  . Penicillins Other (See Comments)    Very flushed  Has patient had a PCN reaction causing immediate rash, facial/tongue/throat swelling, SOB or lightheadedness with hypotension: No Has patient had a PCN reaction causing severe rash involving mucus membranes or skin necrosis: No Has patient had a PCN reaction that required hospitalization No Has patient had a PCN reaction occurring within the last 10 years: No If all of the above answers are "NO", then may proceed with Cephalosporin use.   . Statins Other (See Comments)    Pt c/o muscle aches with use of statins.  . Vancomycin Rash    Social History   Socioeconomic History  . Marital status: Married    Spouse name: Not on file  . Number  of children: Not on file  . Years of education: Not on file  . Highest education level: Not on file  Occupational History  . Not on file  Social Needs  . Financial resource strain: Not on file  . Food insecurity    Worry: Not on file    Inability: Not on file  . Transportation needs    Medical: Not on file    Non-medical: Not on file  Tobacco Use  . Smoking status: Never Smoker  . Smokeless tobacco: Never Used  Substance and Sexual Activity  . Alcohol use: Yes    Alcohol/week: 1.0 standard drinks    Types: 1 Cans of beer  per week    Comment: Rare  . Drug use: No  . Sexual activity: Not Currently  Lifestyle  . Physical activity    Days per week: Not on file    Minutes per session: Not on file  . Stress: Not on file  Relationships  . Social Herbalist on phone: Not on file    Gets together: Not on file    Attends religious service: Not on file    Active member of club or organization: Not on file    Attends meetings of clubs or organizations: Not on file    Relationship status: Not on file  . Intimate partner violence    Fear of current or ex partner: Not on file    Emotionally abused: Not on file    Physically abused: Not on file    Forced sexual activity: Not on file  Other Topics Concern  . Not on file  Social History Narrative  . Not on file     Review of Systems: General: negative for chills, fever, night sweats or weight changes.  Cardiovascular: negative for chest pain, dyspnea on exertion, edema, orthopnea, palpitations, paroxysmal nocturnal dyspnea or shortness of breath Dermatological: negative for rash Respiratory: negative for cough or wheezing Urologic: negative for hematuria Abdominal: negative for nausea, vomiting, diarrhea, bright red blood per rectum, melena, or hematemesis Neurologic: negative for visual changes, syncope, or dizziness All other systems reviewed and are otherwise negative except as noted above.    Blood pressure 131/81, pulse 69, temperature (!) 96.8 F (36 C), height 6' (1.829 m), weight 287 lb 3.2 oz (130.3 kg), SpO2 96 %.  General appearance: alert and no distress Neck: no adenopathy, no carotid bruit, no JVD, supple, symmetrical, trachea midline and thyroid not enlarged, symmetric, no tenderness/mass/nodules Lungs: clear to auscultation bilaterally Heart: regular rate and rhythm, S1, S2 normal, no murmur, click, rub or gallop Extremities: extremities normal, atraumatic, no cyanosis or edema Pulses: 2+ and symmetric Skin: Skin color,  texture, turgor normal. No rashes or lesions Neurologic: Alert and oriented X 3, normal strength and tone. Normal symmetric reflexes. Normal coordination and gait  EKG not performed today  ASSESSMENT AND PLAN:   Dyslipidemia, goal LDL below 100 History of dyslipidemia on Crestor 5 mg every other day with lipid profile performed 03/14/2018 revealing total cholesterol 177, LDL 114 and HDL 48.  12 is not at goal for primary prevention she says that he is statin intolerant at higher doses and frequencies.  Paroxysmal atrial fibrillation (HCC) History of A. fib rate controlled on aspirin. The CHA2DSVASC2 score is 1  .  This will increase to 2 when he becomes 74 next year and at that point we can discuss initiation of oral anticoagulation for stroke prophylaxis.      Pearletha Forge.  Gwenlyn Found MD [facility], [facility] 12/21/2018 3:23 PM

## 2019-01-15 DIAGNOSIS — L570 Actinic keratosis: Secondary | ICD-10-CM | POA: Diagnosis not present

## 2019-01-15 DIAGNOSIS — D225 Melanocytic nevi of trunk: Secondary | ICD-10-CM | POA: Diagnosis not present

## 2019-01-15 DIAGNOSIS — D1801 Hemangioma of skin and subcutaneous tissue: Secondary | ICD-10-CM | POA: Diagnosis not present

## 2019-03-05 DIAGNOSIS — M25572 Pain in left ankle and joints of left foot: Secondary | ICD-10-CM | POA: Diagnosis not present

## 2019-03-05 DIAGNOSIS — M7662 Achilles tendinitis, left leg: Secondary | ICD-10-CM | POA: Diagnosis not present

## 2019-03-15 ENCOUNTER — Other Ambulatory Visit: Payer: Self-pay | Admitting: Cardiovascular Disease

## 2019-03-15 DIAGNOSIS — I2510 Atherosclerotic heart disease of native coronary artery without angina pectoris: Secondary | ICD-10-CM

## 2019-03-15 MED ORDER — ROSUVASTATIN CALCIUM 5 MG PO TABS: 5.0000 mg | ORAL_TABLET | Freq: Every day | ORAL | 3 refills | Status: DC

## 2019-03-15 NOTE — Telephone Encounter (Signed)
° ° ° ° °*  STAT* If patient is at the pharmacy, call can be transferred to refill team.   1. Which medications need to be refilled? (please list name of each medication and dose if known) rosuvastatin (CRESTOR) 5 MG tablet  2. Which pharmacy/location (including street and city if local pharmacy) is medication to be sent to? [facility]  3. Do they need a 30 day or 90 day supply? Joshua Moran

## 2019-03-18 ENCOUNTER — Other Ambulatory Visit: Payer: Self-pay

## 2019-03-18 DIAGNOSIS — M7662 Achilles tendinitis, left leg: Secondary | ICD-10-CM | POA: Diagnosis not present

## 2019-03-19 ENCOUNTER — Other Ambulatory Visit: Payer: Self-pay

## 2019-03-19 DIAGNOSIS — I2510 Atherosclerotic heart disease of native coronary artery without angina pectoris: Secondary | ICD-10-CM

## 2019-03-19 MED ORDER — ROSUVASTATIN CALCIUM 5 MG PO TABS: 5.0000 mg | ORAL_TABLET | Freq: Every day | ORAL | 3 refills | Status: DC

## 2019-03-22 DIAGNOSIS — N401 Enlarged prostate with lower urinary tract symptoms: Secondary | ICD-10-CM | POA: Diagnosis not present

## 2019-03-22 DIAGNOSIS — M179 Osteoarthritis of knee, unspecified: Secondary | ICD-10-CM | POA: Diagnosis not present

## 2019-03-22 DIAGNOSIS — E7800 Pure hypercholesterolemia, unspecified: Secondary | ICD-10-CM | POA: Diagnosis not present

## 2019-03-22 DIAGNOSIS — I4891 Unspecified atrial fibrillation: Secondary | ICD-10-CM | POA: Diagnosis not present

## 2019-03-22 DIAGNOSIS — D649 Anemia, unspecified: Secondary | ICD-10-CM | POA: Diagnosis not present

## 2019-03-27 DIAGNOSIS — M7662 Achilles tendinitis, left leg: Secondary | ICD-10-CM | POA: Diagnosis not present

## 2019-04-03 DIAGNOSIS — M7662 Achilles tendinitis, left leg: Secondary | ICD-10-CM | POA: Diagnosis not present

## 2019-04-09 DIAGNOSIS — R509 Fever, unspecified: Secondary | ICD-10-CM | POA: Diagnosis not present

## 2019-04-09 DIAGNOSIS — N410 Acute prostatitis: Secondary | ICD-10-CM | POA: Diagnosis not present

## 2019-04-09 DIAGNOSIS — R350 Frequency of micturition: Secondary | ICD-10-CM | POA: Diagnosis not present

## 2019-04-12 DIAGNOSIS — R509 Fever, unspecified: Secondary | ICD-10-CM | POA: Diagnosis not present

## 2019-04-16 DIAGNOSIS — M7662 Achilles tendinitis, left leg: Secondary | ICD-10-CM | POA: Diagnosis not present

## 2019-04-25 DIAGNOSIS — R972 Elevated prostate specific antigen [PSA]: Secondary | ICD-10-CM | POA: Diagnosis not present

## 2019-04-29 DIAGNOSIS — N5201 Erectile dysfunction due to arterial insufficiency: Secondary | ICD-10-CM | POA: Diagnosis not present

## 2019-04-29 DIAGNOSIS — N401 Enlarged prostate with lower urinary tract symptoms: Secondary | ICD-10-CM | POA: Diagnosis not present

## 2019-04-29 DIAGNOSIS — R972 Elevated prostate specific antigen [PSA]: Secondary | ICD-10-CM | POA: Diagnosis not present

## 2019-04-29 DIAGNOSIS — N270 Small kidney, unilateral: Secondary | ICD-10-CM | POA: Diagnosis not present

## 2019-04-29 DIAGNOSIS — R351 Nocturia: Secondary | ICD-10-CM | POA: Diagnosis not present

## 2019-05-06 DIAGNOSIS — N401 Enlarged prostate with lower urinary tract symptoms: Secondary | ICD-10-CM | POA: Diagnosis not present

## 2019-05-06 DIAGNOSIS — D649 Anemia, unspecified: Secondary | ICD-10-CM | POA: Diagnosis not present

## 2019-05-06 DIAGNOSIS — M179 Osteoarthritis of knee, unspecified: Secondary | ICD-10-CM | POA: Diagnosis not present

## 2019-05-06 DIAGNOSIS — E7800 Pure hypercholesterolemia, unspecified: Secondary | ICD-10-CM | POA: Diagnosis not present

## 2019-05-06 DIAGNOSIS — I4891 Unspecified atrial fibrillation: Secondary | ICD-10-CM | POA: Diagnosis not present

## 2019-06-03 DIAGNOSIS — E7800 Pure hypercholesterolemia, unspecified: Secondary | ICD-10-CM | POA: Diagnosis not present

## 2019-06-06 DIAGNOSIS — E7800 Pure hypercholesterolemia, unspecified: Secondary | ICD-10-CM | POA: Diagnosis not present

## 2019-06-06 DIAGNOSIS — N401 Enlarged prostate with lower urinary tract symptoms: Secondary | ICD-10-CM | POA: Diagnosis not present

## 2019-06-06 DIAGNOSIS — I4891 Unspecified atrial fibrillation: Secondary | ICD-10-CM | POA: Diagnosis not present

## 2019-06-06 DIAGNOSIS — M179 Osteoarthritis of knee, unspecified: Secondary | ICD-10-CM | POA: Diagnosis not present

## 2019-06-06 DIAGNOSIS — D649 Anemia, unspecified: Secondary | ICD-10-CM | POA: Diagnosis not present

## 2019-06-11 DIAGNOSIS — M179 Osteoarthritis of knee, unspecified: Secondary | ICD-10-CM | POA: Diagnosis not present

## 2019-06-11 DIAGNOSIS — D649 Anemia, unspecified: Secondary | ICD-10-CM | POA: Diagnosis not present

## 2019-06-11 DIAGNOSIS — N401 Enlarged prostate with lower urinary tract symptoms: Secondary | ICD-10-CM | POA: Diagnosis not present

## 2019-06-11 DIAGNOSIS — E7800 Pure hypercholesterolemia, unspecified: Secondary | ICD-10-CM | POA: Diagnosis not present

## 2019-06-11 DIAGNOSIS — I4891 Unspecified atrial fibrillation: Secondary | ICD-10-CM | POA: Diagnosis not present

## 2019-07-04 ENCOUNTER — Ambulatory Visit: Payer: PPO

## 2019-07-08 DIAGNOSIS — M179 Osteoarthritis of knee, unspecified: Secondary | ICD-10-CM | POA: Diagnosis not present

## 2019-07-08 DIAGNOSIS — N401 Enlarged prostate with lower urinary tract symptoms: Secondary | ICD-10-CM | POA: Diagnosis not present

## 2019-07-08 DIAGNOSIS — D649 Anemia, unspecified: Secondary | ICD-10-CM | POA: Diagnosis not present

## 2019-07-08 DIAGNOSIS — E7800 Pure hypercholesterolemia, unspecified: Secondary | ICD-10-CM | POA: Diagnosis not present

## 2019-07-08 DIAGNOSIS — I4891 Unspecified atrial fibrillation: Secondary | ICD-10-CM | POA: Diagnosis not present

## 2019-07-12 ENCOUNTER — Ambulatory Visit: Payer: PPO | Attending: Internal Medicine

## 2019-07-12 DIAGNOSIS — Z23 Encounter for immunization: Secondary | ICD-10-CM | POA: Insufficient documentation

## 2019-07-12 NOTE — Progress Notes (Signed)
   [facility]  Name:  Joshua Moran.    MRN: [id number] DOB: 07-19-1945  07/12/2019  [name] was observed post Covid-19 immunization for 30 minutes based on pre-vaccination screening without incidence. He was provided with Vaccine Information Sheet and instruction to access the V-Safe system.   [name] was instructed to call 911 with any severe reactions post vaccine: Marland Kitchen Difficulty breathing  . Swelling of your face and throat  . A fast heartbeat  . A bad rash all over your body  . Dizziness and weakness    Immunizations Administered    Name Date Dose VIS Date Route   Pfizer COVID-19 Vaccine 07/12/2019 10:50 AM 0.3 mL 05/17/2019 Intramuscular   Manufacturer: [place]   Lot: [id number]   [place]: [id number]

## 2019-07-18 DIAGNOSIS — Z961 Presence of intraocular lens: Secondary | ICD-10-CM | POA: Diagnosis not present

## 2019-07-18 DIAGNOSIS — H33033 Retinal detachment with giant retinal tear, bilateral: Secondary | ICD-10-CM | POA: Diagnosis not present

## 2019-07-18 DIAGNOSIS — L570 Actinic keratosis: Secondary | ICD-10-CM | POA: Diagnosis not present

## 2019-07-18 DIAGNOSIS — L309 Dermatitis, unspecified: Secondary | ICD-10-CM | POA: Diagnosis not present

## 2019-07-18 DIAGNOSIS — D485 Neoplasm of uncertain behavior of skin: Secondary | ICD-10-CM | POA: Diagnosis not present

## 2019-07-18 DIAGNOSIS — H401114 Primary open-angle glaucoma, right eye, indeterminate stage: Secondary | ICD-10-CM | POA: Diagnosis not present

## 2019-07-24 DIAGNOSIS — R972 Elevated prostate specific antigen [PSA]: Secondary | ICD-10-CM | POA: Diagnosis not present

## 2019-07-25 ENCOUNTER — Ambulatory Visit: Payer: PPO

## 2019-07-31 DIAGNOSIS — R972 Elevated prostate specific antigen [PSA]: Secondary | ICD-10-CM | POA: Diagnosis not present

## 2019-07-31 DIAGNOSIS — N5201 Erectile dysfunction due to arterial insufficiency: Secondary | ICD-10-CM | POA: Diagnosis not present

## 2019-07-31 DIAGNOSIS — N281 Cyst of kidney, acquired: Secondary | ICD-10-CM | POA: Diagnosis not present

## 2019-07-31 DIAGNOSIS — N401 Enlarged prostate with lower urinary tract symptoms: Secondary | ICD-10-CM | POA: Diagnosis not present

## 2019-07-31 DIAGNOSIS — R351 Nocturia: Secondary | ICD-10-CM | POA: Diagnosis not present

## 2019-08-01 DIAGNOSIS — J309 Allergic rhinitis, unspecified: Secondary | ICD-10-CM | POA: Diagnosis not present

## 2019-08-01 DIAGNOSIS — J339 Nasal polyp, unspecified: Secondary | ICD-10-CM | POA: Diagnosis not present

## 2019-08-05 DIAGNOSIS — H524 Presbyopia: Secondary | ICD-10-CM | POA: Diagnosis not present

## 2019-08-05 DIAGNOSIS — H5213 Myopia, bilateral: Secondary | ICD-10-CM | POA: Diagnosis not present

## 2019-08-05 DIAGNOSIS — H52203 Unspecified astigmatism, bilateral: Secondary | ICD-10-CM | POA: Diagnosis not present

## 2019-08-06 ENCOUNTER — Emergency Department (HOSPITAL_COMMUNITY): Payer: PPO

## 2019-08-06 ENCOUNTER — Encounter (HOSPITAL_COMMUNITY): Payer: Self-pay | Admitting: Emergency Medicine

## 2019-08-06 ENCOUNTER — Inpatient Hospital Stay (HOSPITAL_COMMUNITY)
Admission: EM | Admit: 2019-08-06 | Discharge: 2019-08-08 | DRG: 065 | Disposition: A | Discharge status: Home / Self Care | Payer: PPO | Attending: Internal Medicine | Admitting: Internal Medicine

## 2019-08-06 ENCOUNTER — Other Ambulatory Visit: Payer: Self-pay

## 2019-08-06 DIAGNOSIS — Z20822 Contact with and (suspected) exposure to covid-19: Secondary | ICD-10-CM | POA: Diagnosis present

## 2019-08-06 DIAGNOSIS — G459 Transient cerebral ischemic attack, unspecified: Secondary | ICD-10-CM

## 2019-08-06 DIAGNOSIS — Z7982 Long term (current) use of aspirin: Secondary | ICD-10-CM | POA: Diagnosis not present

## 2019-08-06 DIAGNOSIS — G8321 Monoplegia of upper limb affecting right dominant side: Secondary | ICD-10-CM | POA: Diagnosis not present

## 2019-08-06 DIAGNOSIS — Z881 Allergy status to other antibiotic agents status: Secondary | ICD-10-CM | POA: Diagnosis not present

## 2019-08-06 DIAGNOSIS — I6523 Occlusion and stenosis of bilateral carotid arteries: Secondary | ICD-10-CM | POA: Diagnosis not present

## 2019-08-06 DIAGNOSIS — Z96653 Presence of artificial knee joint, bilateral: Secondary | ICD-10-CM | POA: Diagnosis not present

## 2019-08-06 DIAGNOSIS — Z801 Family history of malignant neoplasm of trachea, bronchus and lung: Secondary | ICD-10-CM | POA: Diagnosis not present

## 2019-08-06 DIAGNOSIS — Z6841 Body Mass Index (BMI) 40.0 and over, adult: Secondary | ICD-10-CM | POA: Diagnosis not present

## 2019-08-06 DIAGNOSIS — Z888 Allergy status to other drugs, medicaments and biological substances status: Secondary | ICD-10-CM | POA: Diagnosis not present

## 2019-08-06 DIAGNOSIS — R1313 Dysphagia, pharyngeal phase: Secondary | ICD-10-CM | POA: Diagnosis present

## 2019-08-06 DIAGNOSIS — I480 Paroxysmal atrial fibrillation: Secondary | ICD-10-CM | POA: Diagnosis present

## 2019-08-06 DIAGNOSIS — D638 Anemia in other chronic diseases classified elsewhere: Secondary | ICD-10-CM | POA: Diagnosis not present

## 2019-08-06 DIAGNOSIS — K219 Gastro-esophageal reflux disease without esophagitis: Secondary | ICD-10-CM | POA: Diagnosis present

## 2019-08-06 DIAGNOSIS — Z79899 Other long term (current) drug therapy: Secondary | ICD-10-CM | POA: Diagnosis not present

## 2019-08-06 DIAGNOSIS — Z7289 Other problems related to lifestyle: Secondary | ICD-10-CM | POA: Diagnosis not present

## 2019-08-06 DIAGNOSIS — R4701 Aphasia: Secondary | ICD-10-CM | POA: Diagnosis present

## 2019-08-06 DIAGNOSIS — I639 Cerebral infarction, unspecified: Secondary | ICD-10-CM | POA: Diagnosis not present

## 2019-08-06 DIAGNOSIS — E785 Hyperlipidemia, unspecified: Secondary | ICD-10-CM | POA: Diagnosis not present

## 2019-08-06 DIAGNOSIS — H401114 Primary open-angle glaucoma, right eye, indeterminate stage: Secondary | ICD-10-CM | POA: Diagnosis present

## 2019-08-06 DIAGNOSIS — Z880 Allergy status to penicillin: Secondary | ICD-10-CM | POA: Diagnosis not present

## 2019-08-06 DIAGNOSIS — R29818 Other symptoms and signs involving the nervous system: Secondary | ICD-10-CM | POA: Diagnosis not present

## 2019-08-06 DIAGNOSIS — R29703 NIHSS score 3: Secondary | ICD-10-CM | POA: Diagnosis present

## 2019-08-06 DIAGNOSIS — I6340 Cerebral infarction due to embolism of unspecified cerebral artery: Principal | ICD-10-CM | POA: Diagnosis present

## 2019-08-06 DIAGNOSIS — Z713 Dietary counseling and surveillance: Secondary | ICD-10-CM

## 2019-08-06 DIAGNOSIS — R29700 NIHSS score 0: Secondary | ICD-10-CM | POA: Diagnosis not present

## 2019-08-06 DIAGNOSIS — R471 Dysarthria and anarthria: Secondary | ICD-10-CM | POA: Diagnosis present

## 2019-08-06 DIAGNOSIS — R29810 Facial weakness: Secondary | ICD-10-CM | POA: Diagnosis not present

## 2019-08-06 DIAGNOSIS — I6389 Other cerebral infarction: Secondary | ICD-10-CM | POA: Diagnosis not present

## 2019-08-06 DIAGNOSIS — R531 Weakness: Secondary | ICD-10-CM | POA: Diagnosis not present

## 2019-08-06 LAB — COMPREHENSIVE METABOLIC PANEL
ALT: 26 U/L (ref 0–44)
AST: 21 U/L (ref 15–41)
Albumin: 3.7 g/dL (ref 3.5–5.0)
Alkaline Phosphatase: 68 U/L (ref 38–126)
Anion gap: 8 (ref 5–15)
BUN: 25 mg/dL — ABNORMAL HIGH (ref 8–23)
CO2: 27 mmol/L (ref 22–32)
Calcium: 8.5 mg/dL — ABNORMAL LOW (ref 8.9–10.3)
Chloride: 107 mmol/L (ref 98–111)
Creatinine, Ser: 0.96 mg/dL (ref 0.61–1.24)
GFR calc Af Amer: >60 mL/min (ref >60)
GFR calc non Af Amer: >60 mL/min (ref >60)
Glucose, Bld: 89 mg/dL (ref 70–99)
Potassium: 3.6 mmol/L (ref 3.5–5.1)
Sodium: 142 mmol/L (ref 135–145)
Total Bilirubin: 1 mg/dL (ref 0.3–1.2)
Total Protein: 6.8 g/dL (ref 6.5–8.1)

## 2019-08-06 LAB — I-STAT CHEM 8, ED
BUN: 25 mg/dL — ABNORMAL HIGH (ref 8–23)
Calcium, Ion: 1.19 mmol/L (ref 1.15–1.40)
Chloride: 104 mmol/L (ref 98–111)
Creatinine, Ser: 0.9 mg/dL (ref 0.61–1.24)
Glucose, Bld: 82 mg/dL (ref 70–99)
HCT: 46 % (ref 39.0–52.0)
Hemoglobin: 15.6 g/dL (ref 13.0–17.0)
Potassium: 3.6 mmol/L (ref 3.5–5.1)
Sodium: 141 mmol/L (ref 135–145)
TCO2: 30 mmol/L (ref 22–32)

## 2019-08-06 LAB — RAPID URINE DRUG SCREEN, HOSP PERFORMED
Amphetamines: NOT DETECTED
Barbiturates: NOT DETECTED
Benzodiazepines: NOT DETECTED
Cocaine: NOT DETECTED
Opiates: NOT DETECTED
Tetrahydrocannabinol: NOT DETECTED

## 2019-08-06 LAB — DIFFERENTIAL
Abs Immature Granulocytes: 0.02 10*3/uL (ref 0.00–0.07)
Basophils Absolute: 0 10*3/uL (ref 0.0–0.1)
Basophils Relative: 1 %
Eosinophils Absolute: 0.1 10*3/uL (ref 0.0–0.5)
Eosinophils Relative: 2 %
Immature Granulocytes: 0 %
Lymphocytes Relative: 35 %
Lymphs Abs: 2.4 10*3/uL (ref 0.7–4.0)
Monocytes Absolute: 0.8 10*3/uL (ref 0.1–1.0)
Monocytes Relative: 11 %
Neutro Abs: 3.6 10*3/uL (ref 1.7–7.7)
Neutrophils Relative %: 51 %

## 2019-08-06 LAB — CBC
HCT: 46.9 % (ref 39.0–52.0)
Hemoglobin: 14.9 g/dL (ref 13.0–17.0)
MCH: 30.4 pg (ref 26.0–34.0)
MCHC: 31.8 g/dL (ref 30.0–36.0)
MCV: 95.7 fL (ref 80.0–100.0)
Platelets: 241 10*3/uL (ref 150–400)
RBC: 4.9 MIL/uL (ref 4.22–5.81)
RDW: 14 % (ref 11.5–15.5)
WBC: 7 10*3/uL (ref 4.0–10.5)
nRBC: 0 % (ref 0.0–0.2)

## 2019-08-06 LAB — URINALYSIS, ROUTINE W REFLEX MICROSCOPIC
Bilirubin Urine: NEGATIVE
Glucose, UA: NEGATIVE mg/dL
Hgb urine dipstick: NEGATIVE
Ketones, ur: NEGATIVE mg/dL
Leukocytes,Ua: NEGATIVE
Nitrite: NEGATIVE
Protein, ur: NEGATIVE mg/dL
Specific Gravity, Urine: 1.023 (ref 1.005–1.030)
pH: 5 (ref 5.0–8.0)

## 2019-08-06 LAB — PROTIME-INR
INR: 1 (ref 0.8–1.2)
Prothrombin Time: 12.8 seconds (ref 11.4–15.2)

## 2019-08-06 LAB — APTT: aPTT: 68 seconds — ABNORMAL HIGH (ref 24–36)

## 2019-08-06 LAB — RESPIRATORY PANEL BY RT PCR (FLU A&B, COVID)
Influenza A by PCR: NEGATIVE
Influenza B by PCR: NEGATIVE
SARS Coronavirus 2 by RT PCR: NEGATIVE

## 2019-08-06 LAB — ETHANOL: Alcohol, Ethyl (B): <10 mg/dL (ref <10)

## 2019-08-06 MED ORDER — ACETAMINOPHEN 160 MG/5ML PO SOLN: 650.0000 mg | ORAL | Status: DC | PRN

## 2019-08-06 MED ORDER — APIXABAN 5 MG PO TABS
5.0000 mg | ORAL_TABLET | Freq: Two times a day (BID) | ORAL | Status: DC
  Administered 2019-08-06 – 2019-08-08 (×4): 5 mg via ORAL
  Filled 2019-08-06 (×5): qty 1

## 2019-08-06 MED ORDER — BRIMONIDINE TARTRATE 0.2 % OP SOLN
1.0000 [drp] | Freq: Two times a day (BID) | OPHTHALMIC | Status: DC
  Administered 2019-08-07 – 2019-08-08 (×3): 1 [drp] via OPHTHALMIC
  Filled 2019-08-06 (×2): qty 5

## 2019-08-06 MED ORDER — IOHEXOL 350 MG/ML SOLN
100.0000 mL | Freq: Once | INTRAVENOUS | Status: AC | PRN
  Administered 2019-08-06: 100 mL via INTRAVENOUS

## 2019-08-06 MED ORDER — FINASTERIDE 5 MG PO TABS
5.0000 mg | ORAL_TABLET | Freq: Every day | ORAL | Status: DC
  Administered 2019-08-07 – 2019-08-08 (×2): 5 mg via ORAL
  Filled 2019-08-06 (×2): qty 1

## 2019-08-06 MED ORDER — SODIUM CHLORIDE 0.9 % IV SOLN: INTRAVENOUS | Status: DC

## 2019-08-06 MED ORDER — STROKE: EARLY STAGES OF RECOVERY BOOK
Freq: Once | Status: AC
  Filled 2019-08-06: qty 1

## 2019-08-06 MED ORDER — FLUTICASONE PROPIONATE 50 MCG/ACT NA SUSP
2.0000 | Freq: Two times a day (BID) | NASAL | Status: DC
  Administered 2019-08-07 – 2019-08-08 (×4): 2 via NASAL
  Filled 2019-08-06: qty 16

## 2019-08-06 MED ORDER — SODIUM CHLORIDE (PF) 0.9 % IJ SOLN
INTRAMUSCULAR | Status: AC
  Filled 2019-08-06: qty 50

## 2019-08-06 MED ORDER — ENOXAPARIN SODIUM 40 MG/0.4ML ~~LOC~~ SOLN: 40.0000 mg | SUBCUTANEOUS | Status: DC

## 2019-08-06 MED ORDER — ACETAMINOPHEN 650 MG RE SUPP: 650.0000 mg | RECTAL | Status: DC | PRN

## 2019-08-06 MED ORDER — SENNOSIDES-DOCUSATE SODIUM 8.6-50 MG PO TABS: 1.0000 | ORAL_TABLET | Freq: Every evening | ORAL | Status: DC | PRN

## 2019-08-06 MED ORDER — LORATADINE 10 MG PO TABS
10.0000 mg | ORAL_TABLET | Freq: Every day | ORAL | Status: DC
  Administered 2019-08-07 – 2019-08-08 (×2): 10 mg via ORAL
  Filled 2019-08-06 (×2): qty 1

## 2019-08-06 MED ORDER — ACETAMINOPHEN 325 MG PO TABS: 650.0000 mg | ORAL_TABLET | ORAL | Status: DC | PRN

## 2019-08-06 MED ORDER — ROSUVASTATIN CALCIUM 5 MG PO TABS
5.0000 mg | ORAL_TABLET | Freq: Every day | ORAL | Status: DC
  Administered 2019-08-07: 5 mg via ORAL
  Filled 2019-08-06 (×2): qty 1

## 2019-08-06 NOTE — ED Notes (Addendum)
TeleNeuro consulted due to pt developing aphasia again. Pt reports the symptoms began about 1430 after complete resolution. Pt denies any other symptoms other than aphasia. Per Neuro RN: MD to order another CT head and call another Code stroke. Plunkett MD Aware

## 2019-08-06 NOTE — Consult Note (Signed)
Neurology Consultation Reason for Consult: Stroke Referring Physician: Pokhrel, L  CC: Speech difficulty  History is obtained from: Patient  HPI: Joshua Moran. is a 74 y.o. male with a history of atrial fibrillation whose last CHA2DS2-VASc was 1 and therefore not on anticoagulation.  He was in his normal state of health until today around 12:30 PM.  He had initially a transient episode of aphasia that subsequently resolved, however then around 3 PM his symptoms returned and though there remains mild, have never completely gone away.  He states that he is having a little bit of difficulty with speech, and his right arm still slightly numb but earlier he had significant weakness and numbness of the right arm as well.  LKW: 12:30 PM tpa given?: no, mild symptoms NIHSS: 3    ROS: A 14 point ROS was performed and is negative except as noted in the HPI.   Past Medical History:  Diagnosis Date  . Abnormal screening CT of chest    coronary calcium in left main and RCA  . Anemia   . Arthritis   . Complication of anesthesia    Pt reports difficult urinating  . Dyspnea    with some activity  . Dysrhythmia    a- fib   . GERD (gastroesophageal reflux disease)    occasional  . Glaucoma   . Hyperlipidemia    statin intolerant     Family History  Problem Relation Age of Onset  . Lung cancer Mother        smoker     Social History:  reports that he has never smoked. He has never used smokeless tobacco. He reports current alcohol use of about 1.0 standard drinks of alcohol per week. He reports that he does not use drugs.   Exam: Current vital signs: BP (!) 173/116 (BP Location: Right Arm)   Pulse 90   Temp 97.7 F (36.5 C) (Oral)   Resp 16   Ht 6' (1.829 m)   Wt 128.6 kg   SpO2 97%   BMI 38.45 kg/m  Vital signs in last 24 hours: Temp:  [97.7 F (36.5 C)-98.5 F (36.9 C)] 97.7 F (36.5 C) (03/02 2305) Pulse Rate:  [67-95] 90 (03/02 2305) Resp:  [12-23] 16 (03/02  2305) BP: (147-183)/(87-120) 173/116 (03/02 2305) SpO2:  [96 %-100 %] 97 % (03/02 2305) Weight:  [128.6 kg-135 kg] 128.6 kg (03/02 1953)   Physical Exam  Constitutional: Appears well-developed and well-nourished.  Psych: Affect appropriate to situation Eyes: No scleral injection HENT: No OP obstrucion MSK: no joint deformities.  Cardiovascular: Normal rate and regular rhythm.  Respiratory: Effort normal, non-labored breathing GI: Soft.  No distension. There is no tenderness.  Skin: WDI  Neuro: Mental Status: Patient is awake, alert, oriented to person, place, month, year, and situation. Patient is able to give a clear and coherent history. No signs of  Neglect, though he is able to name, he does have an increased latency of speech. Cranial Nerves: II: Visual Fields are full. Pupils are equal, round, and reactive to light.   III,IV, VI: EOMI without ptosis or diploplia.  V: Facial sensation is symmetric to temperature VII: Facial movement with mild flattening of the nasolabial fold VIII: hearing is intact to voice X: Uvula elevates symmetrically XI: Shoulder shrug is symmetric. XII: tongue is midline without atrophy or fasciculations.  Motor: Tone is normal. Bulk is normal.  He has no drift, however to confrontation he does have mild weakness of  the right upper extremity. Sensory: Sensation is symmetric to light touch and temperature in the arms and legs. Cerebellar: FNF intact bilaterally   I have reviewed labs in epic and the results pertinent to this consultation are: CMP-unremarkable  I have reviewed the images obtained: MRI brain-hazy diffusion change in the subcortical white matter left frontal lobe  Impression: 74 year old male with likely embolic stroke due to atrial fibrillation.  He has already been started on anticoagulation and the stroke is relatively small, so I think that this is reasonable.  He is being admitted for therapy evaluation and secondary risk  factor modification  Recommendations: - HgbA1c, fasting lipid panel - Frequent neuro checks - Echocardiogram - Prophylactic therapy-Eliquis - Risk factor modification - Telemetry monitoring - PT consult, OT consult, Speech consult - Stroke team to follow    Roland Rack, MD [facility] [phone]  If 7pm- 7am, please page neurology on call as listed in [place].

## 2019-08-06 NOTE — ED Notes (Signed)
Pt reports that he feels that all his symptoms have resolved at this point.

## 2019-08-06 NOTE — ED Provider Notes (Signed)
[facility] DEPT Provider Note   CSN: [id number] Arrival date & time: 08/06/19  1301     History Chief Complaint  Patient presents with  . Code Stroke    Joshua Moran. is a 74 y.o. male.  Patient is a 74 year old male with a history of paroxysmal atrial fibrillation on aspirin, hyperlipidemia, anemia who is presenting today with his wife for acute onset of right sided weakness and speech disturbance.  Patient states approximately 30 minutes prior to being evaluated he was pulling the garbage can back and when he suddenly lost function of his right arm.  Wife states he could not lift his right arm and his right side of his face was drooping and she could not understand anything he was saying.  Patient states since that time his arm weakness has improved but he is still having speech difficulty.  No leg involvement at this time.  Wife feels like his face looks pretty normal and he chronically has some drooping of his right eyelid.  No prior symptoms of similar.  He denies any headache or neck pain.  He has had no prior history of any GI bleeding or other internal bleeding.  He denies any chest pain, palpitations, shortness of breath.  The history is provided by the patient and the spouse.       Past Medical History:  Diagnosis Date  . Abnormal screening CT of chest    coronary calcium in left main and RCA  . Anemia   . Arthritis   . Complication of anesthesia    Pt reports difficult urinating  . Dyspnea    with some activity  . Dysrhythmia    a- fib   . GERD (gastroesophageal reflux disease)    occasional  . Glaucoma   . Hyperlipidemia    statin intolerant    Patient Active Problem List   Diagnosis Date Noted  . Obesity 07/24/2018  . Paroxysmal atrial fibrillation ([place]) 04/24/2018  . Chest pain 04/24/2018  . OA (osteoarthritis) of knee 01/01/2018  . Dyspnea 09/19/2017  . Primary open angle glaucoma of right eye, indeterminate stage  06/10/2015  . Dyslipidemia, goal LDL below 100 05/05/2015  . Abnormal CT scan of heart 05/05/2015  . Recent retinal detachment, partial, with giant tear, bilateral 11/24/2011  . Status post intraocular lens implant 11/24/2011    Past Surgical History:  Procedure Laterality Date  . EYE SURGERY     bilateral cataracts  . KNEE ARTHROSCOPY W/ DEBRIDEMENT     left   . RETINAL DETACHMENT SURGERY Bilateral   . TOTAL KNEE ARTHROPLASTY Left 01/01/2018   Procedure: LEFT TOTAL KNEE ARTHROPLASTY;  Surgeon: Gaynelle Arabian, MD;  Location: [facility];  Service: Orthopedics;  Laterality: Left;  50 mins  . TOTAL KNEE ARTHROPLASTY Right 06/25/2018   Procedure: TOTAL KNEE ARTHROPLASTY;  Surgeon: Gaynelle Arabian, MD;  Location: [facility];  Service: Orthopedics;  Laterality: Right;  4min  . VASECTOMY         Family History  Problem Relation Age of Onset  . Lung cancer Mother        smoker    Social History   Tobacco Use  . Smoking status: Never Smoker  . Smokeless tobacco: Never Used  Substance Use Topics  . Alcohol use: Yes    Alcohol/week: 1.0 standard drinks    Types: 1 Cans of beer per week    Comment: Rare  . Drug use: No    Home Medications Prior  to Admission medications   Medication Sig Start Date End Date Taking? Authorizing Provider  aspirin EC 81 MG tablet Take 81 mg by mouth daily.    [provider]  brimonidine (ALPHAGAN) 0.15 % ophthalmic solution Place 1 drop into the right eye 2 (two) times daily.    [provider]  cetirizine (ZYRTEC) 10 MG tablet Take 10 mg by mouth every evening.    [provider]  ferrous sulfate 325 (65 FE) MG tablet Take 325 mg by mouth daily with breakfast.    [provider]  finasteride (PROSCAR) 5 MG tablet Take 5 mg by mouth daily.    [provider]  fluticasone (FLONASE) 50 MCG/ACT nasal spray Place 2 sprays into both nostrils 2 (two) times daily.     [provider]  rosuvastatin (CRESTOR) 5 MG  tablet Take 1 tablet (5 mg total) by mouth daily at 6 PM. 03/19/19 06/17/19  Lorretta Harp, MD  sildenafil (REVATIO) 20 MG tablet Take 40-100 mg by mouth daily as needed (erectile dysfunction.).  09/28/17   [provider]  tamsulosin (FLOMAX) 0.4 MG CAPS capsule Take 0.4 mg by mouth daily after supper.    [provider]    Allergies    Xarelto [rivaroxaban], Penicillins, Statins, and Vancomycin  Review of Systems   Review of Systems  All other systems reviewed and are negative.   Physical Exam Updated Vital Signs BP (!) 158/102 (BP Location: Right Arm)   Pulse 85   Resp (!) 23   Ht 6' (1.829 m)   Wt 135 kg   SpO2 97%   BMI 40.36 kg/m   Physical Exam Vitals and nursing note reviewed.  Constitutional:      General: He is not in acute distress.    Appearance: He is well-developed. He is obese.  HENT:     Head: Normocephalic and atraumatic.     Mouth/Throat:     Mouth: Mucous membranes are moist.  Eyes:     Extraocular Movements: Extraocular movements intact.     Conjunctiva/sclera: Conjunctivae normal.     Pupils: Pupils are equal, round, and reactive to light.  Cardiovascular:     Rate and Rhythm: Normal rate. Rhythm irregularly irregular.     Pulses: Normal pulses.     Heart sounds: Normal heart sounds. No murmur.  Pulmonary:     Effort: Pulmonary effort is normal. No respiratory distress.     Breath sounds: Normal breath sounds. No wheezing or rales.  Abdominal:     General: There is no distension.     Palpations: Abdomen is soft.     Tenderness: There is no abdominal tenderness. There is no guarding or rebound.  Musculoskeletal:        General: No tenderness. Normal range of motion.     Cervical back: Normal range of motion and neck supple.     Right lower leg: No edema.     Left lower leg: No edema.  Skin:    General: Skin is warm and dry.     Findings: No erythema or rash.  Neurological:     Mental Status: He is alert and oriented to  person, place, and time.     Cranial Nerves: Dysarthria and facial asymmetry present.     Sensory: Sensation is intact.     Motor: Motor function is intact. No weakness or pronator drift.     Coordination: Coordination is intact.     Gait: Gait is intact.  Comments: Drooping of the right eyelid but symmetric smile and eyebrow raise.  No sensory deficits.  Expressive aphasia.  Patient has difficulty saying no ifs, ands or buts  Psychiatric:        Mood and Affect: Mood normal.        Behavior: Behavior normal.        Thought Content: Thought content normal.     ED Results / Procedures / Treatments   Labs (all labs ordered are listed, but only abnormal results are displayed) Labs Reviewed  APTT - Abnormal; Notable for the following components:      Result Value   aPTT 68 (*)    All other components within normal limits  COMPREHENSIVE METABOLIC PANEL - Abnormal; Notable for the following components:   BUN 25 (*)    Calcium 8.5 (*)    All other components within normal limits  I-STAT CHEM 8, ED - Abnormal; Notable for the following components:   BUN 25 (*)    All other components within normal limits  RESPIRATORY PANEL BY RT PCR (FLU A&B, COVID)  ETHANOL  PROTIME-INR  CBC  DIFFERENTIAL  RAPID URINE DRUG SCREEN, HOSP PERFORMED  URINALYSIS, ROUTINE W REFLEX MICROSCOPIC    EKG EKG Interpretation  Date/Time:  Tuesday August 06 2019 13:07:32 EST Ventricular Rate:  100 PR Interval:    QRS Duration: 126 QT Interval:  425 QTC Calculation: 518 R Axis:   -21 Text Interpretation: Atrial fibrillation Paired ventricular premature complexes Artifact No significant change since last tracing Confirmed by Blanchie Dessert [phone]) on 08/06/2019 1:29:15 PM   Radiology CT Angio Head W or Wo Contrast  Result Date: 08/06/2019 CLINICAL DATA:  Code stroke. 74 year old male with aphasia and right arm weakness onset 1245 hours. History of atrial fibrillation not on anticoagulation. EXAM: CT  ANGIOGRAPHY HEAD AND NECK TECHNIQUE: Multidetector CT imaging of the head and neck was performed using the standard protocol during bolus administration of intravenous contrast. Multiplanar CT image reconstructions and MIPs were obtained to evaluate the vascular anatomy. Carotid stenosis measurements (when applicable) are obtained utilizing NASCET criteria, using the distal internal carotid diameter as the denominator. CONTRAST:  136mL OMNIPAQUE IOHEXOL 350 MG/ML SOLN COMPARISON:  Plain head CT 1316 hours today. FINDINGS: CTA NECK Skeleton: Upper cervical spine ankylosis and lower cervical spine degeneration. No acute osseous abnormality identified. Upper chest: Negative upper lungs. There is some enlargement of the central pulmonary arteries. A small volume of fluid is suspected in the superior pericardial recess. No superior mediastinal lymphadenopathy. Other neck: Negative, no neck mass or lymphadenopathy. Aortic arch: 3 vessel arch configuration with minimal arch atherosclerosis. Right carotid system: Mildly tortuous right CCA without plaque or stenosis. Mild plaque at the right ICA origin and bulb without stenosis. Just below the skull base there is a small 2-3 mm saccular outpouching of the cervical right ICA directed anteriorly (series 10, image 89). But no other regional vessel irregularity. Left carotid system: Tortuous proximal left CCA. Mild plaque at the left ICA origin and bulb without stenosis. Tortuous left ICA, dolichoectatic at the C1 level up to 62 mm. But no vessel irregularity. Vertebral arteries: Mild proximal right subclavian artery plaque without stenosis. Normal right vertebral artery origin. Dominant right vertebral artery is patent to the skull base without stenosis. Mild proximal left subclavian artery plaque without stenosis. Non dominant left vertebral artery origin is normal. Mildly tortuous left V1 segment. The left vertebral remains non dominant to the skull base. There is mild left  V3  plaque without stenosis. CTA HEAD Posterior circulation: The left vertebral V4 segment is diminutive beyond the patent left PICA origin. The right V4 primarily supplies the basilar. Normal right PICA origin. Tortuous vertebrobasilar junction without stenosis. Patent basilar artery without stenosis. Normal SCA and PCA origins. Small right posterior communicating artery, the left is diminutive or absent. The left P1 is mildly tortuous. Bilateral PCA branches are within normal limits. Anterior circulation: Both ICA siphons are patent with minimal plaque and no stenosis. Normal ophthalmic artery origins. Patent carotid termini. Normal MCA and ACA origins. Anterior communicating artery and bilateral ACA branches are within normal limits. Right MCA M1 segment and bifurcation are patent without stenosis. Right MCA branches are within normal limits. Left MCA M1 segment and bifurcation are patent without stenosis. Left MCA branches are within normal limits. No branch occlusion identified. Venous sinuses: Patent. Anatomic variants: Dominant right vertebral artery. Review of the MIP images confirms the above findings IMPRESSION: 1. Negative for large vessel occlusion, and mild for age atherosclerosis in the head and neck. 2. 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 Perhaps this reflects underlying Fibromuscular Dysplasia (FMD), although no other vessel irregularity is identified. Study discussed by telephone with Dr. Blanchie Dessert on 08/06/2019 at 13:41 . Electronically Signed   By: Genevie Ann M.D.   On: 08/06/2019 13:45   CT Head Wo Contrast  Result Date: 08/06/2019 CLINICAL DATA:  74 year old male code stroke presentation earlier today, symptoms now improving. History of atrial fibrillation not on anticoagulation. EXAM: CT HEAD WITHOUT CONTRAST TECHNIQUE: Contiguous axial images were obtained from the base of the skull  through the vertex without intravenous contrast. COMPARISON:  CT head 1317 hours today.  CTA head and neck. FINDINGS: Brain: Some intravascular contrast remains present. No midline shift, ventriculomegaly, mass effect, evidence of mass lesion, intracranial hemorrhage or evidence of cortically based acute infarction. Gray-white matter differentiation is within normal limits throughout the brain. No abnormal enhancement is evident. Vascular: Mild Calcified atherosclerosis at the skull base. Skull: No acute osseous abnormality identified. Sinuses/Orbits: Visualized paranasal sinuses and mastoids are stable and well pneumatized. Other: No acute orbit or scalp soft tissue finding. IMPRESSION: Stable since 1316 hours and normal for age CT appearance of the brain. Electronically Signed   By: Genevie Ann M.D.   On: 08/06/2019 15:21   CT Angio Neck W and/or Wo Contrast  Result Date: 08/06/2019 CLINICAL DATA:  Code stroke. 74 year old male with aphasia and right arm weakness onset 1245 hours. History of atrial fibrillation not on anticoagulation. EXAM: CT ANGIOGRAPHY HEAD AND NECK TECHNIQUE: Multidetector CT imaging of the head and neck was performed using the standard protocol during bolus administration of intravenous contrast. Multiplanar CT image reconstructions and MIPs were obtained to evaluate the vascular anatomy. Carotid stenosis measurements (when applicable) are obtained utilizing NASCET criteria, using the distal internal carotid diameter as the denominator. CONTRAST:  122mL OMNIPAQUE IOHEXOL 350 MG/ML SOLN COMPARISON:  Plain head CT 1316 hours today. FINDINGS: CTA NECK Skeleton: Upper cervical spine ankylosis and lower cervical spine degeneration. No acute osseous abnormality identified. Upper chest: Negative upper lungs. There is some enlargement of the central pulmonary arteries. A small volume of fluid is suspected in the superior pericardial recess. No superior mediastinal lymphadenopathy. Other neck:  Negative, no neck mass or lymphadenopathy. Aortic arch: 3 vessel arch configuration with minimal arch atherosclerosis. Right carotid system: Mildly tortuous right CCA without  plaque or stenosis. Mild plaque at the right ICA origin and bulb without stenosis. Just below the skull base there is a small 2-3 mm saccular outpouching of the cervical right ICA directed anteriorly (series 10, image 89). But no other regional vessel irregularity. Left carotid system: Tortuous proximal left CCA. Mild plaque at the left ICA origin and bulb without stenosis. Tortuous left ICA, dolichoectatic at the C1 level up to 62 mm. But no vessel irregularity. Vertebral arteries: Mild proximal right subclavian artery plaque without stenosis. Normal right vertebral artery origin. Dominant right vertebral artery is patent to the skull base without stenosis. Mild proximal left subclavian artery plaque without stenosis. Non dominant left vertebral artery origin is normal. Mildly tortuous left V1 segment. The left vertebral remains non dominant to the skull base. There is mild left V3 plaque without stenosis. CTA HEAD Posterior circulation: The left vertebral V4 segment is diminutive beyond the patent left PICA origin. The right V4 primarily supplies the basilar. Normal right PICA origin. Tortuous vertebrobasilar junction without stenosis. Patent basilar artery without stenosis. Normal SCA and PCA origins. Small right posterior communicating artery, the left is diminutive or absent. The left P1 is mildly tortuous. Bilateral PCA branches are within normal limits. Anterior circulation: Both ICA siphons are patent with minimal plaque and no stenosis. Normal ophthalmic artery origins. Patent carotid termini. Normal MCA and ACA origins. Anterior communicating artery and bilateral ACA branches are within normal limits. Right MCA M1 segment and bifurcation are patent without stenosis. Right MCA branches are within normal limits. Left MCA M1 segment and  bifurcation are patent without stenosis. Left MCA branches are within normal limits. No branch occlusion identified. Venous sinuses: Patent. Anatomic variants: Dominant right vertebral artery. Review of the MIP images confirms the above findings IMPRESSION: 1. Negative for large vessel occlusion, and mild for age atherosclerosis in the head and neck. 2. 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 Perhaps this reflects underlying Fibromuscular Dysplasia (FMD), although no other vessel irregularity is identified. Study discussed by telephone with Dr. Blanchie Dessert on 08/06/2019 at 13:41 . Electronically Signed   By: Genevie Ann M.D.   On: 08/06/2019 13:45   CT HEAD CODE STROKE WO CONTRAST  Result Date: 08/06/2019 CLINICAL DATA:  Code stroke. 73 year old male with aphasia and right arm weakness onset 1245 hours. History of atrial fibrillation. EXAM: CT HEAD WITHOUT CONTRAST TECHNIQUE: Contiguous axial images were obtained from the base of the skull through the vertex without intravenous contrast. COMPARISON:  None. FINDINGS: Brain: Cerebral volume is within normal limits for age. No midline shift, ventriculomegaly, mass effect, evidence of mass lesion, intracranial hemorrhage or evidence of cortically based acute infarction. Gray-white matter differentiation is within normal limits throughout the brain. Vascular: Mild Calcified atherosclerosis at the skull base. No suspicious intracranial vascular hyperdensity. Skull: No acute osseous abnormality identified. Sinuses/Orbits: Visualized paranasal sinuses and mastoids are well pneumatized. Other: Postoperative changes to the globes. No acute orbit or scalp soft tissue finding. ASPECTS [facility] Stroke Program Early CT Score) Total score (0-10 with 10 being normal): 10 IMPRESSION: Normal for age non contrast CT appearance of the brain.  ASPECTS 10. Study discussed by telephone with  Dr. Blanchie Dessert on 08/06/2019 at 13:276 . Electronically Signed   By: Genevie Ann M.D.   On: 08/06/2019 13:28    Procedures Procedures (including critical care time)  Medications Ordered in ED Medications  sodium  chloride (PF) 0.9 % injection (has no administration in time range)  iohexol (OMNIPAQUE) 350 MG/ML injection 100 mL (100 mLs Intravenous Contrast Given 08/06/19 1324)    ED Course  I have reviewed the triage vital signs and the nursing notes.  Pertinent labs & imaging results that were available during my care of the patient were reviewed by me and considered in my medical decision making (see chart for details).    MDM Rules/Calculators/A&P                      Elderly male presenting today as a code stroke with onset of symptoms approximately 30 minutes prior to arrival.  I initially evaluated the patient at 1300 immediately upon his arrival to the emergency room.  Patient symptoms have improved and now has no notable weakness in his arms but has ongoing aphasia.  History of atrial fibrillation that is currently only treated with aspirin.  No lower extremity involvement.  Code stroke initiated and teleneurology present.  Patient went over for a CT and CTA of the head and neck.  Radiology reports that there is no acute findings on CT and CTA is currently pending.   EKG shows atrial fibrillation.  Labs are pending.  Patient denies any recent illness and states this morning was a normal day prior to symptoms starting around 1230 or 1245 per his wife.  2:31 PM CTA without acute findings.  Teleneurology contacted me and patient symptoms had resolved.  Labs without acute findings.  At 231 I went to reevaluate the patient he states within the last few minutes he started having some mild expressive aphasia again.  Will discuss with neurology.  Patient will need transfer to [facility] for stroke work-up.  2:51 PM Reevaluated and patient is now having more difficulty with aphasia.  Will have  teleneurology evaluate again and they requested another code stroke and repeat non-contrast head CT.    3:28 PM Spoke with teleneurology and his symptoms have yet again resolved.  He recommended emergent MRI but no TPA at this time.   CRITICAL CARE Performed by: Panda Crossin Total critical care time: 45 minutes Critical care time was exclusive of separately billable procedures and treating other patients. Critical care was necessary to treat or prevent imminent or life-threatening deterioration. Critical care was time spent personally by me on the following activities: development of treatment plan with patient and/or surrogate as well as nursing, discussions with consultants, evaluation of patient's response to treatment, examination of patient, obtaining history from patient or surrogate, ordering and performing treatments and interventions, ordering and review of laboratory studies, ordering and review of radiographic studies, pulse oximetry and re-evaluation of patient's condition.   Final Clinical Impression(s) / ED Diagnoses Final diagnoses:  Aphasia  Acute ischemic stroke [facility])    Rx / DC Orders ED Discharge Orders    None       Blanchie Dessert, MD 08/06/19 1643

## 2019-08-06 NOTE — Progress Notes (Addendum)
MRI of the brain has been resulted.  MRI shows 2 cm acute infarct in the subcortical white matter of the mid left frontal lobe.  Communicated with Dr. Lorraine Lax.  He stated that patient could be started on Eliquis.  Will start tonight.  We will change the patient's status to inpatient at this time due to diagnosis of acute stroke.Marland Kitchen

## 2019-08-06 NOTE — ED Notes (Signed)
Pt transported to MRI 

## 2019-08-06 NOTE — ED Notes (Signed)
[facility] called for transport. 

## 2019-08-06 NOTE — ED Notes (Addendum)
Pt called out to nurses station stating that he feels his aphasia is worse at this time. MD aware. MRI called to be made aware of STAT MRI order. Per MRI: Another pt is on the table and it will be 30-40 mins before MRI will be ready for pt.

## 2019-08-06 NOTE — ED Notes (Signed)
Pt reports aphasia has returned at this time. Pt reports difficulty with speech that is waxing and weaning.

## 2019-08-06 NOTE — Consult Note (Signed)
[facility] Consult Services   Date of Service:   08/06/2019 15:03:47  Impression:     .  G45.9 - Transient cerebral ischemic attack, unspecified  Comments/Sign-Out: Patient with recurrent expressive aphasia that has again resolved relatively quickly. CT head wo shows no interval change. I do not think we need to repeat CTAs as the prior one showed no LVO and his symptoms have resolved. Would recommend MRI brain wo STAT.  Metrics: Last Known Well: 08/06/2019 14:45:00 [facility] Notification Time: 08/06/2019 15:02:11 Arrival Time: 08/06/2019 13:01:00 Symptom Onset During ED Stay: 08/06/2019 14:45:00 Stamp Time: 08/06/2019 15:03:47 Time First Login Attempt: 08/06/2019 15:02:00 Symptoms: aphasia NIHSS Start Assessment Time: 08/06/2019 15:04:00 Patient is not a candidate for Alteplase/Activase. Alteplase Medical Decision: 08/06/2019 15:06:00 Patient was not deemed candidate for Alteplase/Activase thrombolytics because of Resolved symptoms (no residual disabling symptoms).  CT head showed no acute hemorrhage or acute core infarct.  Clinical Presentation is not Suggestive of Large Vessel Occlusive Disease  ED Physician notified of diagnostic impression and management plan on 08/06/2019 15:14:00  Our recommendations are outlined below.  Recommendations:     .  Activate Stroke Protocol Admission/Order Set     .  Stroke/Telemetry Floor     .  Neuro Checks     .  Bedside Swallow Eval     .  DVT Prophylaxis     .  IV Fluids, Normal Saline     .  Head of Bed 30 Degrees     .  Euglycemia and Avoid Hyperthermia (PRN Acetaminophen)     .  Antiplatelet Therapy Recommended     .  MRI brain wo STAT  Routine Consultation with [facility] for Follow up Care  Sign Out:     .  Discussed with Emergency Department Provider    ------------------------------------------------------------------------------  History of Present Illness: Patient  is a 74 year old Male.   This is a patient seen earlier by me for a stroke alert. 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 He was seen earlier for transient right arm weakness and expressive aphasia that had resolved on ED arrival. At 1445 he had recurrent mild expressive language difficulty that again resolved. CTA done at 1324 showed no LVO.    Examination: BP(163/104), Pulse(72), Blood Glucose(83) 1A: Level of Consciousness - Alert; keenly responsive + 0 1B: Ask Month and Age - Both Questions Right + 0 1C: Blink Eyes & Squeeze Hands - Performs Both Tasks + 0 2: Test Horizontal Extraocular Movements - Normal + 0 3: Test Visual Fields - No Visual Loss + 0 4: Test Facial Palsy (Use Grimace if Obtunded) - Normal symmetry + 0 5A: Test Left Arm Motor Drift - No Drift for 10 Seconds + 0 5B: Test Right Arm Motor Drift - No Drift for 10 Seconds + 0 6A: Test Left Leg Motor Drift - No Drift for 5 Seconds + 0 6B: Test Right Leg Motor Drift - No Drift for 5 Seconds + 0 7: Test Limb Ataxia (FNF/Heel-Shin) - No Ataxia + 0 8: Test Sensation - Normal; No sensory loss + 0 9: Test Language/Aphasia - Normal; No aphasia + 0 10: Test Dysarthria - Normal + 0 11: Test Extinction/Inattention - No abnormality + 0  NIHSS Score: 0  Pre-Morbid Modified Ranking Scale: 0 Points = No symptoms at all   Patient/Family was informed the Neurology Consult would occur via TeleHealth consult by way of  interactive audio and video telecommunications and consented to receiving care in this manner.   Due to the immediate potential for life-threatening deterioration due to underlying acute neurologic illness, I spent 25 minutes providing critical care. This time includes time for face to face visit via telemedicine, review of medical records, imaging studies and discussion of findings with providers, the patient and/or family.   Dr Arne Cleveland   [facility] [phone]  Case [id number]

## 2019-08-06 NOTE — ED Notes (Signed)
Pt reports that just PTA he was taking out the trash when his right arm became weak. Pt reports that he was unable to lift his right arm at the time that this occurred. Pt reports that he noticed that his speech was altered at this time as well.pt reports that the weakness in his arm has resolved but pt is still having difficulty speaking.Pt reports aphasia that has improved some. Pt reports symptoms began at 1245. Pt denies hx of the same, pt reports that he has a hx of afib, pt denies use of blood thinners.

## 2019-08-06 NOTE — ED Notes (Addendum)
[facility] at bedside. Neurologist reassessing pt at this time. Pt reports symptoms are improving at this time. Per Neurologist: Will be ordering MRI stat for this pt.

## 2019-08-06 NOTE — Consult Note (Signed)
[facility] Consult [facility]   Date of Service:   08/06/2019 13:24:25  Impression:     .  G45.9 - Transient cerebral ischemic attack, unspecified  Comments/Sign-Out: Patient with transient RUE weakness and aphasia. Suspect left hemispheric cortical TIA vs. stroke with rapidly resolving deficits. Patient does have afib, so will need to be transitioned to anticoagulation pending MRI results.  Metrics: Last Known Well: 08/06/2019 12:30:00 [facility] Notification Time: 08/06/2019 13:24:25 Arrival Time: 08/06/2019 13:01:00 Stamp Time: 08/06/2019 13:24:25 Time First Login Attempt: 08/06/2019 13:30:00 Symptoms: aphasia, RUE weakness - resolved NIHSS Start Assessment Time: 08/06/2019 13:31:00 Patient is not a candidate for Alteplase/Activase. Alteplase Medical Decision: 08/06/2019 13:33:00 Patient was not deemed candidate for Alteplase/Activase thrombolytics because of Resolved symptoms (no residual disabling symptoms).  CT head showed no acute hemorrhage or acute core infarct.  Lower Likelihood of Large Vessel Occlusion but Following Stat Studies are Recommended  Reviewed, No Indication of Large Vessel Occlusive Thrombus, Patient is not an NIR Candidate.   ED Physician notified of diagnostic impression and management plan on 08/06/2019 13:42:00  Our recommendations are outlined below.  Recommendations:     .  Activate Stroke Protocol Admission/Order Set     .  Stroke/Telemetry Floor     .  Neuro Checks     .  Bedside Swallow Eval     .  DVT Prophylaxis     .  IV Fluids, Normal Saline     .  Head of Bed 30 Degrees     .  Euglycemia and Avoid Hyperthermia (PRN Acetaminophen)     .  Antiplatelet Therapy Recommended     .  MRI brain wo  Routine Consultation with [facility] for Follow up Care  Sign Out:     .  Discussed with Emergency Department  Provider    ------------------------------------------------------------------------------  History of Present Illness: Patient is a 74 year old Male.  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 He was last normal at 1230. He then experienced right arm weakness and expressive language difficulty. Symptoms resolved after arriving at the ED.      Examination: BP(175/114), Pulse(96), Blood Glucose(82) 1A: Level of Consciousness - Alert; keenly responsive + 0 1B: Ask Month and Age - Both Questions Right + 0 1C: Blink Eyes & Squeeze Hands - Performs Both Tasks + 0 2: Test Horizontal Extraocular Movements - Normal + 0 3: Test Visual Fields - No Visual Loss + 0 4: Test Facial Palsy (Use Grimace if Obtunded) - Normal symmetry + 0 5A: Test Left Arm Motor Drift - No Drift for 10 Seconds + 0 5B: Test Right Arm Motor Drift - No Drift for 10 Seconds + 0 6A: Test Left Leg Motor Drift - No Drift for 5 Seconds + 0 6B: Test Right Leg Motor Drift - No Drift for 5 Seconds + 0 7: Test Limb Ataxia (FNF/Heel-Shin) - No Ataxia + 0 8: Test Sensation - Normal; No sensory loss + 0 9: Test Language/Aphasia - Normal; No aphasia + 0 10: Test Dysarthria - Normal + 0 11: Test Extinction/Inattention - No abnormality + 0  NIHSS Score: 0  Pre-Morbid Modified Ranking Scale: 0 Points = No symptoms at all   Patient/Family was informed the Neurology Consult would occur via TeleHealth consult by way of interactive audio and video telecommunications and consented to  receiving care in this manner.   Due to the immediate potential for life-threatening deterioration due to underlying acute neurologic illness, I spent 25 minutes providing critical care. This time includes time for face to face visit via telemedicine, review of medical  records, imaging studies and discussion of findings with providers, the patient and/or family.   Dr Arne Cleveland   [facility] [phone]  Case [id number]

## 2019-08-06 NOTE — ED Notes (Signed)
Attempted to call report to [facility]. RN unavailable at this time.

## 2019-08-06 NOTE — H&P (Signed)
[facility] History and Physical  Joshua Moran. [id number] DOB: March 20, 1946 DOA: 08/06/2019  Referring physician: ED  PCP: Lawerance Cruel, MD   Patient is coming from: home  Chief Complaint: Slurred speech  HPI: Joshua Moran. is a 74 y.o. male with past medical history of paroxysmal atrial fibrillation not on anticoagulation but taking aspirin, hyperlipidemia, anemia presented to the hospital with acute onset of right-sided weakness and slurred speech.  Patient was trying to pull his garbage can when he suddenly had weakness of his right arm and had difficulty moving it.  His wife also noticed some facial deviation and could not understand his speech.  Subsequently, his arm weakness improved but did have speech difficulty.  Patient was then brought into the hospital.  Patient denies any fever, chills, rigors.  Denies any shortness of breath chest pain or palpitation.  Denies any nausea, vomiting or diarrhea.  Denies any urinary urgency, frequency or dysuria.  ED Course: In the ED, code stroke was activated.  Telestroke saw the patient and underwent a CT scan of the head and CT angiogram of the head and neck.  There was no acute findings.  EKG showed atrial fibrillation.    During the ED stay patient again had expressive aphasia.  Teleneurology was consulted again and repeat the CT head was done which was negative.  Symptoms again started resolving so TPA was not planned.  Patient was then considered for transfer to [facility] for further care with MRI of the brain.  Review of Systems:  All systems were reviewed and were negative unless otherwise mentioned in the HPI  Past Medical History:  Diagnosis Date  . Abnormal screening CT of chest    coronary calcium in left main and RCA  . Anemia   . Arthritis   . Complication of anesthesia    Pt reports difficult urinating  . Dyspnea    with some activity  . Dysrhythmia    a- fib   . GERD (gastroesophageal reflux disease)    occasional  . Glaucoma   . Hyperlipidemia    statin intolerant   Past Surgical History:  Procedure Laterality Date  . EYE SURGERY     bilateral cataracts  . KNEE ARTHROSCOPY W/ DEBRIDEMENT     left   . RETINAL DETACHMENT SURGERY Bilateral   . TOTAL KNEE ARTHROPLASTY Left 01/01/2018   Procedure: LEFT TOTAL KNEE ARTHROPLASTY;  Surgeon: Gaynelle Arabian, MD;  Location: [facility];  Service: Orthopedics;  Laterality: Left;  50 mins  . TOTAL KNEE ARTHROPLASTY Right 06/25/2018   Procedure: TOTAL KNEE ARTHROPLASTY;  Surgeon: Gaynelle Arabian, MD;  Location: [facility];  Service: Orthopedics;  Laterality: Right;  21min  . VASECTOMY      Social History:  reports that he has never smoked. He has never used smokeless tobacco. He reports current alcohol use of about 1.0 standard drinks of alcohol per week. He reports that he does not use drugs.  Allergies  Allergen Reactions  . Xarelto [Rivaroxaban] Rash  . Penicillins Other (See Comments)    Very flushed  Has patient had a PCN reaction causing immediate rash, facial/tongue/throat swelling, SOB or lightheadedness with hypotension: No Has patient had a PCN reaction causing severe rash involving mucus membranes or skin necrosis: No Has patient had a PCN reaction that required hospitalization No Has patient had a PCN reaction occurring within the last 10 years: No If all of the above answers are "NO", then may proceed with Cephalosporin use.   Marland Kitchen  Statins Other (See Comments)    Pt c/o muscle aches with use of statins.  . Vancomycin Rash    Family History  Problem Relation Age of Onset  . Lung cancer Mother        smoker     Prior to Admission medications   Medication Sig Start Date End Date Taking? Authorizing Provider  aspirin EC 81 MG tablet Take 81 mg by mouth daily.   Yes [provider]  brimonidine (ALPHAGAN) 0.15 % ophthalmic solution Place 1 drop into the right eye 2 (two) times daily.   Yes [provider]  cetirizine  (ZYRTEC) 10 MG tablet Take 10 mg by mouth every evening.   Yes [provider]  cholecalciferol (VITAMIN D) 25 MCG (1000 UNIT) tablet Take 1,000 Units by mouth daily.   Yes [provider]  ferrous sulfate 325 (65 FE) MG tablet Take 325 mg by mouth daily with breakfast.   Yes [provider]  finasteride (PROSCAR) 5 MG tablet Take 5 mg by mouth daily.   Yes [provider]  fluticasone (FLONASE) 50 MCG/ACT nasal spray Place 2 sprays into both nostrils 2 (two) times daily.    Yes [provider]  Glucosamine 750 MG TABS Take 750 mg by mouth daily.   Yes [provider]  meloxicam (MOBIC) 15 MG tablet Take 15 mg by mouth daily. 06/11/19  Yes [provider]  Multiple Vitamins-Minerals (CENTRUM MEN PO) Take 1 tablet by mouth daily.   Yes [provider]  rosuvastatin (CRESTOR) 5 MG tablet Take 1 tablet (5 mg total) by mouth daily at 6 PM. 03/19/19 08/06/19 Yes Lorretta Harp, MD  sildenafil (REVATIO) 20 MG tablet Take 40-100 mg by mouth daily as needed (erectile dysfunction.).  09/28/17  Yes [provider]    Physical Exam: Vitals:   08/06/19 1545 08/06/19 1556 08/06/19 1600 08/06/19 1719  BP:   (!) 180/93 (!) 154/115  Pulse: 74 82 78 82  Resp: 13 20 (!) 23 20  TempSrc:      SpO2: 99% 96% 96% 98%  Weight:      Height:       Wt Readings from Last 3 Encounters:  08/06/19 135 kg  12/21/18 130.3 kg  07/24/18 130.4 kg   Body mass index is 40.36 kg/m.  General:  Average built, not in obvious distress, obese HENT: Normocephalic, pupils equally reacting to light and accommodation.  No scleral pallor or icterus noted. Oral mucosa is moist.  Chest:  Clear breath sounds.  Diminished breath sounds bilaterally. No crackles or wheezes.  CVS: S1 &S2 heard. No murmur.  Irregularly irregular rhythm. Abdomen: Soft, nontender, nondistended.  Bowel sounds are heard.  Liver is not palpable, no abdominal mass  palpated Extremities: No cyanosis, clubbing or edema.  Peripheral pulses are palpable. Psych: Alert, awake and oriented, normal mood, but has expressive aphasia. CNS: Expressive aphasia, power equal in all extremities.   No pronator drift at the time of my exam.  No facial asymmetry.   Skin: Warm and dry.  No rashes noted.  Labs on Admission:   CBC: Recent Labs  Lab 08/06/19 1315 08/06/19 1330  WBC 7.0  --   NEUTROABS 3.6  --   HGB 14.9 15.6  HCT 46.9 46.0  MCV 95.7  --   PLT 241  --     Basic Metabolic Panel: Recent Labs  Lab 08/06/19 1315 08/06/19 1330  NA 142 141  K 3.6 3.6  CL  107 104  CO2 27  --   GLUCOSE 89 82  BUN 25* 25*  CREATININE 0.96 0.90  CALCIUM 8.5*  --     Liver Function Tests: Recent Labs  Lab 08/06/19 1315  AST 21  ALT 26  ALKPHOS 68  BILITOT 1.0  PROT 6.8  ALBUMIN 3.7   No results for input(s): LIPASE, AMYLASE in the last 168 hours. No results for input(s): AMMONIA in the last 168 hours.  Cardiac Enzymes: No results for input(s): CKTOTAL, CKMB, CKMBINDEX, TROPONINI in the last 168 hours.  BNP (last 3 results) No results for input(s): BNP in the last 8760 hours.  ProBNP (last 3 results) No results for input(s): PROBNP in the last 8760 hours.  CBG: No results for input(s): GLUCAP in the last 168 hours.  Lipase     Component Value Date/Time   LIPASE 21 (L) 03/05/2015 1612     Urinalysis    Component Value Date/Time   COLORURINE YELLOW 08/06/2019 1315   APPEARANCEUR CLEAR 08/06/2019 1315   LABSPEC 1.023 08/06/2019 1315   PHURINE 5.0 08/06/2019 1315   GLUCOSEU NEGATIVE 08/06/2019 1315   HGBUR NEGATIVE 08/06/2019 1315   BILIRUBINUR NEGATIVE 08/06/2019 1315   KETONESUR NEGATIVE 08/06/2019 1315   PROTEINUR NEGATIVE 08/06/2019 1315   UROBILINOGEN 0.2 03/05/2015 1921   NITRITE NEGATIVE 08/06/2019 1315   LEUKOCYTESUR NEGATIVE 08/06/2019 1315     Drugs of Abuse     Component Value Date/Time   LABOPIA NONE DETECTED  08/06/2019 1315   COCAINSCRNUR NONE DETECTED 08/06/2019 1315   LABBENZ NONE DETECTED 08/06/2019 1315   AMPHETMU NONE DETECTED 08/06/2019 1315   THCU NONE DETECTED 08/06/2019 1315   LABBARB NONE DETECTED 08/06/2019 1315      Radiological Exams on Admission: CT Angio Head W or Wo Contrast  Result Date: 08/06/2019 CLINICAL DATA:  Code stroke. 74 year old male with aphasia and right arm weakness onset 1245 hours. History of atrial fibrillation not on anticoagulation. EXAM: CT ANGIOGRAPHY HEAD AND NECK TECHNIQUE: Multidetector CT imaging of the head and neck was performed using the standard protocol during bolus administration of intravenous contrast. Multiplanar CT image reconstructions and MIPs were obtained to evaluate the vascular anatomy. Carotid stenosis measurements (when applicable) are obtained utilizing NASCET criteria, using the distal internal carotid diameter as the denominator. CONTRAST:  19mL OMNIPAQUE IOHEXOL 350 MG/ML SOLN COMPARISON:  Plain head CT 1316 hours today. FINDINGS: CTA NECK Skeleton: Upper cervical spine ankylosis and lower cervical spine degeneration. No acute osseous abnormality identified. Upper chest: Negative upper lungs. There is some enlargement of the central pulmonary arteries. A small volume of fluid is suspected in the superior pericardial recess. No superior mediastinal lymphadenopathy. Other neck: Negative, no neck mass or lymphadenopathy. Aortic arch: 3 vessel arch configuration with minimal arch atherosclerosis. Right carotid system: Mildly tortuous right CCA without plaque or stenosis. Mild plaque at the right ICA origin and bulb without stenosis. Just below the skull base there is a small 2-3 mm saccular outpouching of the cervical right ICA directed anteriorly (series 10, image 89). But no other regional vessel irregularity. Left carotid system: Tortuous proximal left CCA. Mild plaque at the left ICA origin and bulb without stenosis. Tortuous left ICA,  dolichoectatic at the C1 level up to 62 mm. But no vessel irregularity. Vertebral arteries: Mild proximal right subclavian artery plaque without stenosis. Normal right vertebral artery origin. Dominant right vertebral artery is patent to the skull base without stenosis. Mild proximal left subclavian artery plaque without stenosis. Non  dominant left vertebral artery origin is normal. Mildly tortuous left V1 segment. The left vertebral remains non dominant to the skull base. There is mild left V3 plaque without stenosis. CTA HEAD Posterior circulation: The left vertebral V4 segment is diminutive beyond the patent left PICA origin. The right V4 primarily supplies the basilar. Normal right PICA origin. Tortuous vertebrobasilar junction without stenosis. Patent basilar artery without stenosis. Normal SCA and PCA origins. Small right posterior communicating artery, the left is diminutive or absent. The left P1 is mildly tortuous. Bilateral PCA branches are within normal limits. Anterior circulation: Both ICA siphons are patent with minimal plaque and no stenosis. Normal ophthalmic artery origins. Patent carotid termini. Normal MCA and ACA origins. Anterior communicating artery and bilateral ACA branches are within normal limits. Right MCA M1 segment and bifurcation are patent without stenosis. Right MCA branches are within normal limits. Left MCA M1 segment and bifurcation are patent without stenosis. Left MCA branches are within normal limits. No branch occlusion identified. Venous sinuses: Patent. Anatomic variants: Dominant right vertebral artery. Review of the MIP images confirms the above findings IMPRESSION: 1. Negative for large vessel occlusion, and mild for age atherosclerosis in the head and neck. 2. 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 Perhaps this reflects underlying Fibromuscular Dysplasia (FMD), although no  other vessel irregularity is identified. Study discussed by telephone with Dr. Blanchie Dessert on 08/06/2019 at 13:41 . Electronically Signed   By: Genevie Ann M.D.   On: 08/06/2019 13:45   CT Head Wo Contrast  Result Date: 08/06/2019 CLINICAL DATA:  74 year old male code stroke presentation earlier today, symptoms now improving. History of atrial fibrillation not on anticoagulation. EXAM: CT HEAD WITHOUT CONTRAST TECHNIQUE: Contiguous axial images were obtained from the base of the skull through the vertex without intravenous contrast. COMPARISON:  CT head 1317 hours today.  CTA head and neck. FINDINGS: Brain: Some intravascular contrast remains present. No midline shift, ventriculomegaly, mass effect, evidence of mass lesion, intracranial hemorrhage or evidence of cortically based acute infarction. Gray-white matter differentiation is within normal limits throughout the brain. No abnormal enhancement is evident. Vascular: Mild Calcified atherosclerosis at the skull base. Skull: No acute osseous abnormality identified. Sinuses/Orbits: Visualized paranasal sinuses and mastoids are stable and well pneumatized. Other: No acute orbit or scalp soft tissue finding. IMPRESSION: Stable since 1316 hours and normal for age CT appearance of the brain. Electronically Signed   By: Genevie Ann M.D.   On: 08/06/2019 15:21   CT Angio Neck W and/or Wo Contrast  Result Date: 08/06/2019 CLINICAL DATA:  Code stroke. 74 year old male with aphasia and right arm weakness onset 1245 hours. History of atrial fibrillation not on anticoagulation. EXAM: CT ANGIOGRAPHY HEAD AND NECK TECHNIQUE: Multidetector CT imaging of the head and neck was performed using the standard protocol during bolus administration of intravenous contrast. Multiplanar CT image reconstructions and MIPs were obtained to evaluate the vascular anatomy. Carotid stenosis measurements (when applicable) are obtained utilizing NASCET criteria, using the distal internal carotid  diameter as the denominator. CONTRAST:  136mL OMNIPAQUE IOHEXOL 350 MG/ML SOLN COMPARISON:  Plain head CT 1316 hours today. FINDINGS: CTA NECK Skeleton: Upper cervical spine ankylosis and lower cervical spine degeneration. No acute osseous abnormality identified. Upper chest: Negative upper lungs. There is some enlargement of the central pulmonary arteries. A small volume of fluid is suspected in the superior pericardial recess. No superior mediastinal  lymphadenopathy. Other neck: Negative, no neck mass or lymphadenopathy. Aortic arch: 3 vessel arch configuration with minimal arch atherosclerosis. Right carotid system: Mildly tortuous right CCA without plaque or stenosis. Mild plaque at the right ICA origin and bulb without stenosis. Just below the skull base there is a small 2-3 mm saccular outpouching of the cervical right ICA directed anteriorly (series 10, image 89). But no other regional vessel irregularity. Left carotid system: Tortuous proximal left CCA. Mild plaque at the left ICA origin and bulb without stenosis. Tortuous left ICA, dolichoectatic at the C1 level up to 62 mm. But no vessel irregularity. Vertebral arteries: Mild proximal right subclavian artery plaque without stenosis. Normal right vertebral artery origin. Dominant right vertebral artery is patent to the skull base without stenosis. Mild proximal left subclavian artery plaque without stenosis. Non dominant left vertebral artery origin is normal. Mildly tortuous left V1 segment. The left vertebral remains non dominant to the skull base. There is mild left V3 plaque without stenosis. CTA HEAD Posterior circulation: The left vertebral V4 segment is diminutive beyond the patent left PICA origin. The right V4 primarily supplies the basilar. Normal right PICA origin. Tortuous vertebrobasilar junction without stenosis. Patent basilar artery without stenosis. Normal SCA and PCA origins. Small right posterior communicating artery, the left is  diminutive or absent. The left P1 is mildly tortuous. Bilateral PCA branches are within normal limits. Anterior circulation: Both ICA siphons are patent with minimal plaque and no stenosis. Normal ophthalmic artery origins. Patent carotid termini. Normal MCA and ACA origins. Anterior communicating artery and bilateral ACA branches are within normal limits. Right MCA M1 segment and bifurcation are patent without stenosis. Right MCA branches are within normal limits. Left MCA M1 segment and bifurcation are patent without stenosis. Left MCA branches are within normal limits. No branch occlusion identified. Venous sinuses: Patent. Anatomic variants: Dominant right vertebral artery. Review of the MIP images confirms the above findings IMPRESSION: 1. Negative for large vessel occlusion, and mild for age atherosclerosis in the head and neck. 2. 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 Perhaps this reflects underlying Fibromuscular Dysplasia (FMD), although no other vessel irregularity is identified. Study discussed by telephone with Dr. Blanchie Dessert on 08/06/2019 at 13:41 . Electronically Signed   By: Genevie Ann M.D.   On: 08/06/2019 13:45   MR BRAIN WO CONTRAST  Result Date: 08/06/2019 CLINICAL DATA:  Neuro deficit, acute, stroke suspected. Additional history provided: 74 year old male with history of paroxysmal atrial fibrillation presenting with acute onset right-sided weakness and speech disturbance. EXAM: MRI HEAD WITHOUT CONTRAST TECHNIQUE: Multiplanar, multiecho pulse sequences of the brain and surrounding structures were obtained without intravenous contrast. COMPARISON:  Noncontrast CT head and CT angiogram head/neck performed earlier the same day 08/06/2019. FINDINGS: Brain: Mild intermittent motion degradation. There is a 2 cm focus of restricted diffusion within the subcortical white matter of the mid left frontal  lobe consistent with acute infarction (series 7, images 45 and 46) (series 13, image 55). No evidence of acute infarct elsewhere within the brain. No evidence of intracranial mass. No midline shift or extra-axial fluid collection. No chronic intracranial blood products. No significant white matter disease for age. Cerebral volume is normal for age. Vascular: Flow voids maintained within the proximal large arterial vessels. Skull and upper cervical spine: No focal marrow lesion. Sinuses/Orbits: Visualized orbits demonstrate no acute abnormality. Moderate ethmoid sinus mucosal thickening. Mild  mucosal thickening is also present within the inferior right maxillary sinus. No significant mastoid effusion. IMPRESSION: 1. Mildly motion degraded examination. 2. 2 cm acute infarct within the subcortical white matter of the mid left frontal lobe. 3. Otherwise unremarkable MRI appearance of the brain for age. 4. Paranasal sinus disease as described. Electronically Signed   By: Kellie Simmering DO   On: 08/06/2019 17:22   CT HEAD CODE STROKE WO CONTRAST  Result Date: 08/06/2019 CLINICAL DATA:  Code stroke. 74 year old male with aphasia and right arm weakness onset 1245 hours. History of atrial fibrillation. EXAM: CT HEAD WITHOUT CONTRAST TECHNIQUE: Contiguous axial images were obtained from the base of the skull through the vertex without intravenous contrast. COMPARISON:  None. FINDINGS: Brain: Cerebral volume is within normal limits for age. No midline shift, ventriculomegaly, mass effect, evidence of mass lesion, intracranial hemorrhage or evidence of cortically based acute infarction. Gray-white matter differentiation is within normal limits throughout the brain. Vascular: Mild Calcified atherosclerosis at the skull base. No suspicious intracranial vascular hyperdensity. Skull: No acute osseous abnormality identified. Sinuses/Orbits: Visualized paranasal sinuses and mastoids are well pneumatized. Other: Postoperative changes  to the globes. No acute orbit or scalp soft tissue finding. ASPECTS [facility] Stroke Program Early CT Score) Total score (0-10 with 10 being normal): 10 IMPRESSION: Normal for age non contrast CT appearance of the brain.  ASPECTS 10. Study discussed by telephone with Dr. Blanchie Dessert on 08/06/2019 at 13:276 . Electronically Signed   By: Genevie Ann M.D.   On: 08/06/2019 13:28    EKG: Personally reviewed by me which shows rate controlled atrial fibrillation  Assessment/Plan Principal Problem:   TIA (transient ischemic attack) Active Problems:   Dyslipidemia, goal LDL below 100   Paroxysmal atrial fibrillation (HCC)   TIA.  Rule out CVA.  History of atrial fibrillation not on anticoagulation on aspirin.  Still has expressive aphasia.  CT angiogram and CT head negative.  Teleneurology has seen.  Neurology has been consulted.  Will need MRI of the brain.  Patient will be placed in [facility] at [facility].  Speech swallow evaluation, PT OT evaluation.  Check 2D echocardiogram.  Neurochecks.  Check fasting lipid profile, A1c.  History of hyperlipidemia.  On Crestor low-dose.  Will continue.  History of paroxysmal atrial fibrillation.  Was on aspirin low-dose at home.  Patient might need anticoagulant.  Will leave it to neurology to decide.   DVT Prophylaxis: Lovenox subcu  Consultant: Neurology, Dr Aroor  Code Status: Full code  Microbiology none  Antibiotics: None  Family Communication:  Patients' condition and plan of care including tests being ordered have been discussed with the patient and the patient's wife at bedside who indicate understanding and agree with the plan.  Disposition Plan: Home  Severity of Illness: The appropriate patient status for this patient is OBSERVATION. Observation status is judged to be reasonable and necessary in order to provide the required intensity of service to ensure the patient's safety. The patient's presenting symptoms, physical exam  findings, and initial radiographic and laboratory data in the context of their medical condition is felt to place them at decreased risk for further clinical deterioration. Furthermore, it is anticipated that the patient will be medically stable for discharge from the hospital within 2 midnights of admission. The following factors support the patient status of observation.   Signed, Flora Lipps, MD [facility] 08/06/2019

## 2019-08-06 NOTE — Progress Notes (Signed)
ANTICOAGULATION CONSULT NOTE - Initial Consult  Pharmacy Consult for Apixaban Indication: atrial fibrillation  Allergies  Allergen Reactions  . Xarelto [Rivaroxaban] Rash  . Penicillins Other (See Comments)    Very flushed  Has patient had a PCN reaction causing immediate rash, facial/tongue/throat swelling, SOB or lightheadedness with hypotension: No Has patient had a PCN reaction causing severe rash involving mucus membranes or skin necrosis: No Has patient had a PCN reaction that required hospitalization No Has patient had a PCN reaction occurring within the last 10 years: No If all of the above answers are "NO", then may proceed with Cephalosporin use.   . Statins Other (See Comments)    Pt c/o muscle aches with use of statins.  . Vancomycin Rash    Patient Measurements: Height: 6' (182.9 cm) Weight: 297 lb 9.6 oz (135 kg) IBW/kg (Calculated) : 77.6   Vital Signs: Temp Source: Oral (03/02 1307) BP: 163/111 (03/02 1805) Pulse Rate: 69 (03/02 1805)  Labs: Recent Labs    08/06/19 1315 08/06/19 1330  HGB 14.9 15.6  HCT 46.9 46.0  PLT 241  --   APTT 68*  --   LABPROT 12.8  --   INR 1.0  --   CREATININE 0.96 0.90    Estimated Creatinine Clearance: 102.5 mL/min (by C-G formula based on SCr of 0.9 mg/dL).   Medical History: Past Medical History:  Diagnosis Date  . Abnormal screening CT of chest    coronary calcium in left main and RCA  . Anemia   . Arthritis   . Complication of anesthesia    Pt reports difficult urinating  . Dyspnea    with some activity  . Dysrhythmia    a- fib   . GERD (gastroesophageal reflux disease)    occasional  . Glaucoma   . Hyperlipidemia    statin intolerant    Medications:  No oral anticoagulation PTA  Assessment:  74 yr male admitted with TIA, r/o CVA  PMH significant for AFib (only on ASA, no oral anticoagulation)  MRI = + acute infarct of mid left frontal lobe.  Neurology recommended starting  Eliquis  Patient's allergy list includes rivaroxaban (rash). Spoke with Dr Louanne Belton.  Benefit of DOAC outweighs risk. Received confirmation to continue with Eliquis  Goal of Therapy:  Full anticoagulation   Plan:   Apixaban 5mg  BID  F/U for any side effects  Will provide apixaban eduation prior to discharge  Brandyce Dimario, Toribio Harbour, PharmD 08/06/2019,6:41 PM

## 2019-08-07 ENCOUNTER — Ambulatory Visit: Payer: PPO

## 2019-08-07 ENCOUNTER — Inpatient Hospital Stay (HOSPITAL_COMMUNITY): Payer: PPO

## 2019-08-07 DIAGNOSIS — I639 Cerebral infarction, unspecified: Secondary | ICD-10-CM

## 2019-08-07 DIAGNOSIS — I6389 Other cerebral infarction: Secondary | ICD-10-CM

## 2019-08-07 LAB — CBC
HCT: 44.6 % (ref 39.0–52.0)
Hemoglobin: 14.8 g/dL (ref 13.0–17.0)
MCH: 30.8 pg (ref 26.0–34.0)
MCHC: 33.2 g/dL (ref 30.0–36.0)
MCV: 92.7 fL (ref 80.0–100.0)
Platelets: 235 10*3/uL (ref 150–400)
RBC: 4.81 MIL/uL (ref 4.22–5.81)
RDW: 13.7 % (ref 11.5–15.5)
WBC: 7.1 10*3/uL (ref 4.0–10.5)
nRBC: 0 % (ref 0.0–0.2)

## 2019-08-07 LAB — MAGNESIUM: Magnesium: 2 mg/dL (ref 1.7–2.4)

## 2019-08-07 LAB — BASIC METABOLIC PANEL
Anion gap: 9 (ref 5–15)
BUN: 14 mg/dL (ref 8–23)
CO2: 27 mmol/L (ref 22–32)
Calcium: 8.6 mg/dL — ABNORMAL LOW (ref 8.9–10.3)
Chloride: 105 mmol/L (ref 98–111)
Creatinine, Ser: 0.79 mg/dL (ref 0.61–1.24)
GFR calc Af Amer: >60 mL/min (ref >60)
GFR calc non Af Amer: >60 mL/min (ref >60)
Glucose, Bld: 95 mg/dL (ref 70–99)
Potassium: 3.6 mmol/L (ref 3.5–5.1)
Sodium: 141 mmol/L (ref 135–145)

## 2019-08-07 LAB — LIPID PANEL
Cholesterol: 175 mg/dL (ref 0–200)
HDL: 47 mg/dL (ref >40)
LDL Cholesterol: 106 mg/dL — ABNORMAL HIGH (ref 0–99)
Total CHOL/HDL Ratio: 3.7 RATIO
Triglycerides: 109 mg/dL (ref <150)
VLDL: 22 mg/dL (ref 0–40)

## 2019-08-07 LAB — HEMOGLOBIN A1C
Hgb A1c MFr Bld: 5.8 % — ABNORMAL HIGH (ref 4.8–5.6)
Mean Plasma Glucose: 119.76 mg/dL

## 2019-08-07 LAB — ECHOCARDIOGRAM COMPLETE
Height: 72 in
Weight: 4536.18 oz

## 2019-08-07 LAB — PHOSPHORUS: Phosphorus: 4 mg/dL (ref 2.5–4.6)

## 2019-08-07 NOTE — Evaluation (Signed)
Physical Therapy Evaluation Patient Details Name: Joshua Moran. MRN: [id number] DOB: 01-29-1946 Today's Date: 08/07/2019   History of Present Illness  Pt isa  74 y/o male with PMH of paroxysmal atrial fibrillation not on anticoagulation but taking aspirin, hyperlipidemia, anemia presented to the hospital with acute onset of right-sided weakness and slurred speech.  CT negative. MRI "hazy diffusion change in the subcortical white matter left frontal lobe".   Clinical Impression  Patient evaluated by Physical Therapy with no further acute PT needs identified. Prior to admission, pt lives with his wife and is independent. Pt reports no residual deficits from stroke. States he has baseline mild dynamic balance impairments. Pt ambulating 200 feet without physical assist or difficulty; HR 93-129 bpm. Negotiated 3 steps to prepare for discharge home. All education has been completed and the patient has no further questions. No follow-up Physical Therapy or equipment needs. PT is signing off. Thank you for this referral.     Follow Up Recommendations No PT follow up    Equipment Recommendations  None recommended by PT    Recommendations for Other Services       Precautions / Restrictions Precautions Precautions: None Restrictions Weight Bearing Restrictions: No      Mobility  Bed Mobility Overal bed mobility: Modified Independent             General bed mobility comments: OOB in chair  Transfers Overall transfer level: Modified independent Equipment used: None                Ambulation/Gait Ambulation/Gait assistance: Modified independent (Device/Increase time) Gait Distance (Feet): 200 Feet Assistive device: None Gait Pattern/deviations: Step-through pattern;Wide base of support;Decreased stride length Gait velocity: decreased   General Gait Details: Pt with wider BOS, no gross unsteadiness.  Stairs Stairs: Yes Stairs assistance: Modified independent  (Device/Increase time) Stair Management: Two rails Number of Stairs: 3 General stair comments: Step over step technique  Wheelchair Mobility    Modified Rankin (Stroke Patients Only) Modified Rankin (Stroke Patients Only) Pre-Morbid Rankin Score: No significant disability Modified Rankin: No significant disability     Balance Overall balance assessment: Mild deficits observed, not formally tested                                           Pertinent Vitals/Pain Pain Assessment: No/denies pain    Home Living Family/patient expects to be discharged to:: Private residence Living Arrangements: Spouse/significant other Available Help at Discharge: Family;Available 24 hours/day Type of Home: House Home Access: Stairs to enter Entrance Stairs-Rails: None Entrance Stairs-Number of Steps: 4 (2, 1, 1) Home Layout: One level Home Equipment: Walker - 2 wheels;Cane - single point;Bedside commode;Shower seat;Grab bars - tub/shower      Prior Function Level of Independence: Independent         Comments: drives, yardwork IADLS       Hand Dominance   Dominant Hand: Right    Extremity/Trunk Assessment   Upper Extremity Assessment Upper Extremity Assessment: Overall WFL for tasks assessed    Lower Extremity Assessment Lower Extremity Assessment: RLE deficits/detail;LLE deficits/detail RLE Deficits / Details: Strength 5/5 LLE Deficits / Details: Strength 5/5       Communication   Communication: Expressive difficulties(slightly slurred )  Cognition Arousal/Alertness: Awake/alert Behavior During Therapy: WFL for tasks assessed/performed Overall Cognitive Status: Within Functional Limits for tasks assessed  General Comments General comments (skin integrity, edema, etc.): spouse present and supportive, discussed stroke signs and symptoms     Exercises     Assessment/Plan    PT Assessment Patent  does not need any further PT services  PT Problem List         PT Treatment Interventions      PT Goals (Current goals can be found in the Care Plan section)  Acute Rehab PT Goals Patient Stated Goal: get better and get home  PT Goal Formulation: All assessment and education complete, DC therapy    Frequency     Barriers to discharge        Co-evaluation               AM-PAC PT "6 Clicks" Mobility  Outcome Measure Help needed turning from your back to your side while in a flat bed without using bedrails?: None Help needed moving from lying on your back to sitting on the side of a flat bed without using bedrails?: None Help needed moving to and from a bed to a chair (including a wheelchair)?: None Help needed standing up from a chair using your arms (e.g., wheelchair or bedside chair)?: None Help needed to walk in hospital room?: None Help needed climbing 3-5 steps with a railing? : None 6 Click Score: 24    End of Session   Activity Tolerance: Patient tolerated treatment well Patient left: in chair;with call bell/phone within reach;with family/visitor present Nurse Communication: Mobility status PT Visit Diagnosis: Unsteadiness on feet (R26.81);Other symptoms and signs involving the nervous system (R29.898)    Time: [id number] PT Time Calculation (min) (ACUTE ONLY): 10 min   Charges:   PT Evaluation $PT Eval Low Complexity: [place], PT, DPT Acute Rehabilitation Services Pager [phone] Office [phone]   Deno Etienne 08/07/2019, 4:11 PM

## 2019-08-07 NOTE — Evaluation (Addendum)
Occupational Therapy Evaluation and Discharge  Patient Details Name: Joshua Moran. MRN: [id number] DOB: [birth year] Today's Date: 08/07/2019    History of Present Illness Pt isa  74 y/o male with PMH of paroxysmal atrial fibrillation not on anticoagulation but taking aspirin, hyperlipidemia, anemia presented to the hospital with acute onset of right-sided weakness and slurred speech.  CT negative. MRI "hazy diffusion change in the subcortical white matter left frontal lobe".    Clinical Impression   PTA patient independent and driving. Admitted for above and presenting near baseline modified independent level with ADLs, in room mobility and transfers. Strength, sensation, cognition and vision WFL. Pt reports he feels at his baseline level with mobility and ADLs, but continues to have deficits with his speech. He has good support from his spouse.  Based on performance today, no further OT needs have been identified and OT will sign off.  Thank you for this referral.     Follow Up Recommendations  No OT follow up    Equipment Recommendations  None recommended by OT    Recommendations for Other Services       Precautions / Restrictions Restrictions Weight Bearing Restrictions: No      Mobility Bed Mobility Overal bed mobility: Modified Independent             General bed mobility comments: HOB elevated, but no assist required  Transfers Overall transfer level: Modified independent                    Balance Overall balance assessment: Mild deficits observed, not formally tested                                         ADL either performed or assessed with clinical judgement   ADL Overall ADL's : Modified independent;At baseline                                       General ADL Comments: patient demonstrating ability to complete LB dressing, grooming at sink, transfers and simulated tub transfers with modified indepednence; good  safety awareness      Vision Baseline Vision/History: Wears glasses Wears Glasses: At all times Patient Visual Report: No change from baseline Vision Assessment?: No apparent visual deficits     Perception     Praxis      Pertinent Vitals/Pain Pain Assessment: No/denies pain     Hand Dominance Right   Extremity/Trunk Assessment Upper Extremity Assessment Upper Extremity Assessment: Overall WFL for tasks assessed   Lower Extremity Assessment Lower Extremity Assessment: Defer to PT evaluation       Communication Communication Communication: Expressive difficulties(slightly slurred )   Cognition Arousal/Alertness: Awake/alert Behavior During Therapy: WFL for tasks assessed/performed Overall Cognitive Status: Within Functional Limits for tasks assessed                                     General Comments  spouse present and supportive, discussed stroke signs and symptoms     Exercises     Shoulder Instructions      Home Living Family/patient expects to be discharged to:: Private residence Living Arrangements: Spouse/significant other Available Help at Discharge: Family;Available 24 hours/day Type of Home: House  Home Access: Stairs to enter Entrance Stairs-Number of Steps: 4 (2, 1, 1) Entrance Stairs-Rails: None Home Layout: One level     Bathroom Shower/Tub: Occupational psychologist: Standard     Home Equipment: Environmental consultant - 2 wheels;Cane - single point;Bedside commode;Shower seat;Grab bars - tub/shower          Prior Functioning/Environment Level of Independence: Independent        Comments: drives, yardwork IADLS          OT Problem List:        OT Treatment/Interventions:      OT Goals(Current goals can be found in the care plan section) Acute Rehab OT Goals Patient Stated Goal: get better and get home  OT Goal Formulation: With patient  OT Frequency:     Barriers to D/C:            Co-evaluation               AM-PAC OT "6 Clicks" Daily Activity     Outcome Measure Help from another person eating meals?: None Help from another person taking care of personal grooming?: None Help from another person toileting, which includes using toliet, bedpan, or urinal?: None Help from another person bathing (including washing, rinsing, drying)?: None Help from another person to put on and taking off regular upper body clothing?: None Help from another person to put on and taking off regular lower body clothing?: None 6 Click Score: 24   End of Session Nurse Communication: Mobility status  Activity Tolerance: Patient tolerated treatment well Patient left: in chair;with call bell/phone within reach;with family/visitor present  OT Visit Diagnosis: Other symptoms and signs involving the nervous system (R29.898)                Time: [id number] OT Time Calculation (min): 20 min Charges:  OT General Charges $OT Visit: 1 Visit OT Evaluation $OT Eval Low Complexity: 1 Low  Jolaine Artist, OT Acute Rehabilitation Services Pager [phone] Office [phone]    Delight Stare 08/07/2019, 3:18 PM

## 2019-08-07 NOTE — Progress Notes (Signed)
  Speech Language Pathology Treatment: Cognitive-Linquistic(Dysarthria)  Patient Details Name: Joshua Moran. MRN: [id number] DOB: 1945-07-23 Today's Date: 08/07/2019 Time: [id number] SLP Time Calculation (min) (ACUTE ONLY): 16 min  Assessment / Plan / Recommendation Clinical Impression  Pt was seen for dysarthria treatment and was cooperative during the session. He was educated regarding the nature of dysarthria, and compensatory strategies to improve speech intelligibility. Dysarthria handout was provided to improve comprehension and pt verbalized understanding regarding all areas of education. He used compensatory strategies at the phrase level with 90% accuracy increasing to 100% accuracy with min. cues for overarticulation. At the 5-7 word sentence level he demonstrated 75% accuracy increasing to 100% accuracy with min-mod cues for overarticulation and vocal intensity. He required moderate cues during conversation. SLP will continue to follow pt.    HPI HPI: Pt is a 74 y.o. male with past medical history of paroxysmal atrial fibrillation, hyperlipidemia, anemia who presented to the ED with acute onset of right-sided weakness, aphasia, and slurred speech. MRI of the brain revealed 2 cm acute infarct within the subcortical white matter of the mid left frontal lobe.      SLP Plan  Continue with current plan of care  Patient needs continued Speech Lanaguage Pathology Services    Recommendations                   Follow up Recommendations: Other (comment)(Continued SLP services at level of care recommended by PT/OT) SLP Visit Diagnosis: Dysarthria and anarthria (R47.1) Plan: Continue with current plan of care       Bettymae Yott I. Hardin Negus, [place], [place] Office number [phone] Pager [place] 08/07/2019, 9:33 AM

## 2019-08-07 NOTE — Progress Notes (Signed)
Progress Note    Joshua Moran.  [id number] DOB: 1945/07/04  DOA: 08/06/2019 PCP: Lawerance Cruel, MD      Brief Narrative:    Medical records reviewed and are as summarized below:  Joshua Moran. is an 74 y.o. male with past medical history of paroxysmal atrial fibrillation not on anticoagulation but taking aspirin, hyperlipidemia, anemia presented to the hospital with acute onset of right-sided weakness and slurred speech.  Patient was trying to pull his garbage can when he suddenly had weakness of his right arm and had difficulty moving it.  His wife also noticed some facial deviation and could not understand his speech.  Subsequently, his arm weakness improved but did have speech difficulty.  Patient was then brought into the hospital.      Assessment/Plan:   Principal Problem:   Acute ischemic stroke [facility]) Active Problems:   Dyslipidemia, goal LDL below 100   Paroxysmal atrial fibrillation (HCC)   Acute ischemic stroke in the left mid frontal lobe/slurred speech: Continue Eliquis and rosuvastatin.  PT and OT evaluation.  Speech therapist recommended modified barium swallow for further evaluation..  Paroxysmal atrial fibrillation: Patient was previously on low-dose aspirin but this has been switched to Eliquis.  He understands the risks and benefits of long-term anticoagulation with Eliquis  Hyperlipidemia: Continue Crestor   Body mass index is 38.45 kg/m.  (Morbid obesity)   Family Communication/Anticipated D/C date and plan/Code Status   DVT prophylaxis: Lovenox Code Status: Full code Family Communication: Plan discussed with his wife at the bedside Disposition Plan: Patient is from home.  Possible discharge to home tomorrow.  PT, OT and 2D echo are pending     Subjective:   He said he still has some slurred speech but weakness and numbness in right upper extremity has resolved.  Objective:    Vitals:   08/07/19 0106 08/07/19 0313  08/07/19 0805 08/07/19 1308  BP: (!) 144/97 (!) 140/99 (!) 128/93 110/76  Pulse: 95 94 67 77  Resp: 18 16 18 18   Temp: 98.4 F (36.9 C) 98.1 F (36.7 C) 98 F (36.7 C) 97.7 F (36.5 C)  TempSrc: Oral Oral Oral Oral  SpO2: 96% 97% 99% 95%  Weight:      Height:        Intake/Output Summary (Last 24 hours) at 08/07/2019 1515 Last data filed at 08/07/2019 1100 Gross per 24 hour  Intake 1030.13 ml  Output 2400 ml  Net -1369.87 ml   Filed Weights   08/06/19 1333 08/06/19 1953  Weight: 135 kg 128.6 kg    Exam:  GEN: NAD SKIN: No rash EYES: EOMI ENT: MMM CV: RRR PULM: CTA B ABD: soft, ND, NT, +BS CNS: AAO x 3, slurred speech, non focal EXT: No edema or tenderness   Data Reviewed:   I have personally reviewed following labs and imaging studies:  Labs: Labs show the following:   Basic Metabolic Panel: Recent Labs  Lab 08/06/19 1315 08/06/19 1315 08/06/19 1330 08/07/19 0614  NA 142  --  141 141  K 3.6   < > 3.6 3.6  CL 107  --  104 105  CO2 27  --   --  27  GLUCOSE 89  --  82 95  BUN 25*  --  25* 14  CREATININE 0.96  --  0.90 0.79  CALCIUM 8.5*  --   --  8.6*  MG  --   --   --  2.0  PHOS  --   --   --  4.0   < > = values in this interval not displayed.   GFR Estimated Creatinine Clearance: 112.3 mL/min (by C-G formula based on SCr of 0.79 mg/dL). Liver Function Tests: Recent Labs  Lab 08/06/19 1315  AST 21  ALT 26  ALKPHOS 68  BILITOT 1.0  PROT 6.8  ALBUMIN 3.7   No results for input(s): LIPASE, AMYLASE in the last 168 hours. No results for input(s): AMMONIA in the last 168 hours. Coagulation profile Recent Labs  Lab 08/06/19 1315  INR 1.0    CBC: Recent Labs  Lab 08/06/19 1315 08/06/19 1330 08/07/19 0614  WBC 7.0  --  7.1  NEUTROABS 3.6  --   --   HGB 14.9 15.6 14.8  HCT 46.9 46.0 44.6  MCV 95.7  --  92.7  PLT 241  --  235   Cardiac Enzymes: No results for input(s): CKTOTAL, CKMB, CKMBINDEX, TROPONINI in the last 168 hours. BNP  (last 3 results) No results for input(s): PROBNP in the last 8760 hours. CBG: No results for input(s): GLUCAP in the last 168 hours. D-Dimer: No results for input(s): DDIMER in the last 72 hours. Hgb A1c: Recent Labs    08/07/19 0614  HGBA1C 5.8*   Lipid Profile: Recent Labs    08/07/19 0614  CHOL 175  HDL 47  LDLCALC 106*  TRIG 109  CHOLHDL 3.7   Thyroid function studies: No results for input(s): TSH, T4TOTAL, T3FREE, THYROIDAB in the last 72 hours.  Invalid input(s): FREET3 Anemia work up: No results for input(s): VITAMINB12, FOLATE, FERRITIN, TIBC, IRON, RETICCTPCT in the last 72 hours. Sepsis Labs: Recent Labs  Lab 08/06/19 1315 08/07/19 0614  WBC 7.0 7.1    Microbiology Recent Results (from the past 240 hour(s))  Respiratory Panel by RT PCR (Flu A&B, Covid) - Nasopharyngeal Swab     Status: None   Collection Time: 08/06/19  1:32 PM   Specimen: Nasopharyngeal Swab  Result Value Ref Range Status   SARS Coronavirus 2 by RT PCR NEGATIVE NEGATIVE Final    Comment: (NOTE) SARS-CoV-2 target nucleic acids are NOT DETECTED. The SARS-CoV-2 RNA is generally detectable in upper respiratoy specimens during the acute phase of infection. The lowest concentration of SARS-CoV-2 viral copies this assay can detect is 131 copies/mL. A negative result does not preclude SARS-Cov-2 infection and should not be used as the sole basis for treatment or other patient management decisions. A negative result may occur with  improper specimen collection/handling, submission of specimen other than nasopharyngeal swab, presence of viral mutation(s) within the areas targeted by this assay, and inadequate number of viral copies (<131 copies/mL). A negative result must be combined with clinical observations, patient history, and epidemiological information. The expected result is Negative. Fact Sheet for Patients:  PinkCheek.be Fact Sheet for Healthcare  Providers:  GravelBags.it This test is not yet ap proved or cleared by the [place] FDA and  has been authorized for detection and/or diagnosis of SARS-CoV-2 by FDA under an Emergency Use Authorization (EUA). This EUA will remain  in effect (meaning this test can be used) for the duration of the COVID-19 declaration under Section 564(b)(1) of the Act, 21 U.S.C. section 360bbb-3(b)(1), unless the authorization is terminated or revoked sooner.    Influenza A by PCR NEGATIVE NEGATIVE Final   Influenza B by PCR NEGATIVE NEGATIVE Final    Comment: (NOTE) The Xpert Xpress SARS-CoV-2/FLU/RSV assay is intended as an  aid in  the diagnosis of influenza from Nasopharyngeal swab specimens and  should not be used as a sole basis for treatment. Nasal washings and  aspirates are unacceptable for Xpert Xpress SARS-CoV-2/FLU/RSV  testing. Fact Sheet for Patients: PinkCheek.be Fact Sheet for Healthcare Providers: GravelBags.it This test is not yet approved or cleared by the [place] FDA and  has been authorized for detection and/or diagnosis of SARS-CoV-2 by  FDA under an Emergency Use Authorization (EUA). This EUA will remain  in effect (meaning this test can be used) for the duration of the  Covid-19 declaration under Section 564(b)(1) of the Act, 21  U.S.C. section 360bbb-3(b)(1), unless the authorization is  terminated or revoked. Performed at [facility], [place] [address] [place], [place] [phone]     Procedures and diagnostic studies:  CT Angio Head W or Wo Contrast  Result Date: 08/06/2019 CLINICAL DATA:  Code stroke. 74 year old male with aphasia and right arm weakness onset 1245 hours. History of atrial fibrillation not on anticoagulation. EXAM: CT ANGIOGRAPHY HEAD AND NECK TECHNIQUE: Multidetector CT imaging of the head and neck was performed using the standard protocol  during bolus administration of intravenous contrast. Multiplanar CT image reconstructions and MIPs were obtained to evaluate the vascular anatomy. Carotid stenosis measurements (when applicable) are obtained utilizing NASCET criteria, using the distal internal carotid diameter as the denominator. CONTRAST:  185mL OMNIPAQUE IOHEXOL 350 MG/ML SOLN COMPARISON:  Plain head CT 1316 hours today. FINDINGS: CTA NECK Skeleton: Upper cervical spine ankylosis and lower cervical spine degeneration. No acute osseous abnormality identified. Upper chest: Negative upper lungs. There is some enlargement of the central pulmonary arteries. A small volume of fluid is suspected in the superior pericardial recess. No superior mediastinal lymphadenopathy. Other neck: Negative, no neck mass or lymphadenopathy. Aortic arch: 3 vessel arch configuration with minimal arch atherosclerosis. Right carotid system: Mildly tortuous right CCA without plaque or stenosis. Mild plaque at the right ICA origin and bulb without stenosis. Just below the skull base there is a small 2-3 mm saccular outpouching of the cervical right ICA directed anteriorly (series 10, image 89). But no other regional vessel irregularity. Left carotid system: Tortuous proximal left CCA. Mild plaque at the left ICA origin and bulb without stenosis. Tortuous left ICA, dolichoectatic at the C1 level up to 62 mm. But no vessel irregularity. Vertebral arteries: Mild proximal right subclavian artery plaque without stenosis. Normal right vertebral artery origin. Dominant right vertebral artery is patent to the skull base without stenosis. Mild proximal left subclavian artery plaque without stenosis. Non dominant left vertebral artery origin is normal. Mildly tortuous left V1 segment. The left vertebral remains non dominant to the skull base. There is mild left V3 plaque without stenosis. CTA HEAD Posterior circulation: The left vertebral V4 segment is diminutive beyond the patent left  PICA origin. The right V4 primarily supplies the basilar. Normal right PICA origin. Tortuous vertebrobasilar junction without stenosis. Patent basilar artery without stenosis. Normal SCA and PCA origins. Small right posterior communicating artery, the left is diminutive or absent. The left P1 is mildly tortuous. Bilateral PCA branches are within normal limits. Anterior circulation: Both ICA siphons are patent with minimal plaque and no stenosis. Normal ophthalmic artery origins. Patent carotid termini. Normal MCA and ACA origins. Anterior communicating artery and bilateral ACA branches are within normal limits. Right MCA M1 segment and bifurcation are patent without stenosis. Right MCA branches are within normal limits. Left MCA M1 segment and bifurcation are patent without  stenosis. Left MCA branches are within normal limits. No branch occlusion identified. Venous sinuses: Patent. Anatomic variants: Dominant right vertebral artery. Review of the MIP images confirms the above findings IMPRESSION: 1. Negative for large vessel occlusion, and mild for age atherosclerosis in the head and neck. 2. 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 Perhaps this reflects underlying Fibromuscular Dysplasia (FMD), although no other vessel irregularity is identified. Study discussed by telephone with Dr. Blanchie Dessert on 08/06/2019 at 13:41 . Electronically Signed   By: Genevie Ann M.D.   On: 08/06/2019 13:45   CT Head Wo Contrast  Result Date: 08/06/2019 CLINICAL DATA:  74 year old male code stroke presentation earlier today, symptoms now improving. History of atrial fibrillation not on anticoagulation. EXAM: CT HEAD WITHOUT CONTRAST TECHNIQUE: Contiguous axial images were obtained from the base of the skull through the vertex without intravenous contrast. COMPARISON:  CT head 1317 hours today.  CTA head and neck. FINDINGS: Brain: Some  intravascular contrast remains present. No midline shift, ventriculomegaly, mass effect, evidence of mass lesion, intracranial hemorrhage or evidence of cortically based acute infarction. Gray-white matter differentiation is within normal limits throughout the brain. No abnormal enhancement is evident. Vascular: Mild Calcified atherosclerosis at the skull base. Skull: No acute osseous abnormality identified. Sinuses/Orbits: Visualized paranasal sinuses and mastoids are stable and well pneumatized. Other: No acute orbit or scalp soft tissue finding. IMPRESSION: Stable since 1316 hours and normal for age CT appearance of the brain. Electronically Signed   By: Genevie Ann M.D.   On: 08/06/2019 15:21   CT Angio Neck W and/or Wo Contrast  Result Date: 08/06/2019 CLINICAL DATA:  Code stroke. 74 year old male with aphasia and right arm weakness onset 1245 hours. History of atrial fibrillation not on anticoagulation. EXAM: CT ANGIOGRAPHY HEAD AND NECK TECHNIQUE: Multidetector CT imaging of the head and neck was performed using the standard protocol during bolus administration of intravenous contrast. Multiplanar CT image reconstructions and MIPs were obtained to evaluate the vascular anatomy. Carotid stenosis measurements (when applicable) are obtained utilizing NASCET criteria, using the distal internal carotid diameter as the denominator. CONTRAST:  130mL OMNIPAQUE IOHEXOL 350 MG/ML SOLN COMPARISON:  Plain head CT 1316 hours today. FINDINGS: CTA NECK Skeleton: Upper cervical spine ankylosis and lower cervical spine degeneration. No acute osseous abnormality identified. Upper chest: Negative upper lungs. There is some enlargement of the central pulmonary arteries. A small volume of fluid is suspected in the superior pericardial recess. No superior mediastinal lymphadenopathy. Other neck: Negative, no neck mass or lymphadenopathy. Aortic arch: 3 vessel arch configuration with minimal arch atherosclerosis. Right carotid  system: Mildly tortuous right CCA without plaque or stenosis. Mild plaque at the right ICA origin and bulb without stenosis. Just below the skull base there is a small 2-3 mm saccular outpouching of the cervical right ICA directed anteriorly (series 10, image 89). But no other regional vessel irregularity. Left carotid system: Tortuous proximal left CCA. Mild plaque at the left ICA origin and bulb without stenosis. Tortuous left ICA, dolichoectatic at the C1 level up to 62 mm. But no vessel irregularity. Vertebral arteries: Mild proximal right subclavian artery plaque without stenosis. Normal right vertebral artery origin. Dominant right vertebral artery is patent to the skull base without stenosis. Mild proximal left subclavian artery plaque without stenosis. Non dominant left vertebral artery origin is normal. Mildly tortuous left V1 segment. The left vertebral remains non dominant  to the skull base. There is mild left V3 plaque without stenosis. CTA HEAD Posterior circulation: The left vertebral V4 segment is diminutive beyond the patent left PICA origin. The right V4 primarily supplies the basilar. Normal right PICA origin. Tortuous vertebrobasilar junction without stenosis. Patent basilar artery without stenosis. Normal SCA and PCA origins. Small right posterior communicating artery, the left is diminutive or absent. The left P1 is mildly tortuous. Bilateral PCA branches are within normal limits. Anterior circulation: Both ICA siphons are patent with minimal plaque and no stenosis. Normal ophthalmic artery origins. Patent carotid termini. Normal MCA and ACA origins. Anterior communicating artery and bilateral ACA branches are within normal limits. Right MCA M1 segment and bifurcation are patent without stenosis. Right MCA branches are within normal limits. Left MCA M1 segment and bifurcation are patent without stenosis. Left MCA branches are within normal limits. No branch occlusion identified. Venous sinuses:  Patent. Anatomic variants: Dominant right vertebral artery. Review of the MIP images confirms the above findings IMPRESSION: 1. Negative for large vessel occlusion, and mild for age atherosclerosis in the head and neck. 2. 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 Perhaps this reflects underlying Fibromuscular Dysplasia (FMD), although no other vessel irregularity is identified. Study discussed by telephone with Dr. Blanchie Dessert on 08/06/2019 at 13:41 . Electronically Signed   By: Genevie Ann M.D.   On: 08/06/2019 13:45   MR BRAIN WO CONTRAST  Result Date: 08/06/2019 CLINICAL DATA:  Neuro deficit, acute, stroke suspected. Additional history provided: 73 year old male with history of paroxysmal atrial fibrillation presenting with acute onset right-sided weakness and speech disturbance. EXAM: MRI HEAD WITHOUT CONTRAST TECHNIQUE: Multiplanar, multiecho pulse sequences of the brain and surrounding structures were obtained without intravenous contrast. COMPARISON:  Noncontrast CT head and CT angiogram head/neck performed earlier the same day 08/06/2019. FINDINGS: Brain: Mild intermittent motion degradation. There is a 2 cm focus of restricted diffusion within the subcortical white matter of the mid left frontal lobe consistent with acute infarction (series 7, images 45 and 46) (series 13, image 55). No evidence of acute infarct elsewhere within the brain. No evidence of intracranial mass. No midline shift or extra-axial fluid collection. No chronic intracranial blood products. No significant white matter disease for age. Cerebral volume is normal for age. Vascular: Flow voids maintained within the proximal large arterial vessels. Skull and upper cervical spine: No focal marrow lesion. Sinuses/Orbits: Visualized orbits demonstrate no acute abnormality. Moderate ethmoid sinus mucosal thickening. Mild mucosal thickening is also  present within the inferior right maxillary sinus. No significant mastoid effusion. IMPRESSION: 1. Mildly motion degraded examination. 2. 2 cm acute infarct within the subcortical white matter of the mid left frontal lobe. 3. Otherwise unremarkable MRI appearance of the brain for age. 4. Paranasal sinus disease as described. Electronically Signed   By: Kellie Simmering DO   On: 08/06/2019 17:22   CT HEAD CODE STROKE WO CONTRAST  Result Date: 08/06/2019 CLINICAL DATA:  Code stroke. 74 year old male with aphasia and right arm weakness onset 1245 hours. History of atrial fibrillation. EXAM: CT HEAD WITHOUT CONTRAST TECHNIQUE: Contiguous axial images were obtained from the base of the skull through the vertex without intravenous contrast. COMPARISON:  None. FINDINGS: Brain: Cerebral volume is within normal limits for age. No midline shift, ventriculomegaly, mass effect, evidence of mass lesion, intracranial hemorrhage or evidence of cortically based acute infarction. Gray-white matter differentiation is within  normal limits throughout the brain. Vascular: Mild Calcified atherosclerosis at the skull base. No suspicious intracranial vascular hyperdensity. Skull: No acute osseous abnormality identified. Sinuses/Orbits: Visualized paranasal sinuses and mastoids are well pneumatized. Other: Postoperative changes to the globes. No acute orbit or scalp soft tissue finding. ASPECTS [facility] Stroke Program Early CT Score) Total score (0-10 with 10 being normal): 10 IMPRESSION: Normal for age non contrast CT appearance of the brain.  ASPECTS 10. Study discussed by telephone with Dr. Blanchie Dessert on 08/06/2019 at 13:276 . Electronically Signed   By: Genevie Ann M.D.   On: 08/06/2019 13:28    Medications:   . apixaban  5 mg Oral BID  . brimonidine  1 drop Right Eye BID  . finasteride  5 mg Oral Daily  . fluticasone  2 spray Each Nare BID  . loratadine  10 mg Oral Daily  . rosuvastatin  5 mg Oral q1800   Continuous  Infusions: . sodium chloride 75 mL/hr at 08/07/19 1024     LOS: 1 day   Nyeli Holtmeyer  [facility]     08/07/2019, 3:15 PM

## 2019-08-07 NOTE — Progress Notes (Signed)
OT Cancellation Note  Patient Details Name: Joshua Moran. MRN: [id number] DOB: 02/17/1946   Cancelled Treatment:    Reason Eval/Treat Not Completed: Active bedrest order. Will follow and see as able.    Jolaine Artist, OT Acute Rehabilitation Services Pager [phone] Office [phone]    Delight Stare 08/07/2019, 11:33 AM

## 2019-08-07 NOTE — Progress Notes (Signed)
  Echocardiogram 2D Echocardiogram has been performed.  Joshua Moran 08/07/2019, 10:59 AM

## 2019-08-07 NOTE — Evaluation (Signed)
Clinical/Bedside Swallow Evaluation Patient Details  Name: Tamiko Ellington. MRN: [id number] Date of Birth: [birth year]  Today's Date: 08/07/2019 Time: SLP Start Time (ACUTE ONLY): 1145 SLP Stop Time (ACUTE ONLY): 1200 SLP Time Calculation (min) (ACUTE ONLY): 15 min  Past Medical History:  Past Medical History:  Diagnosis Date  . Abnormal screening CT of chest    coronary calcium in left main and RCA  . Anemia   . Arthritis   . Complication of anesthesia    Pt reports difficult urinating  . Dyspnea    with some activity  . Dysrhythmia    a- fib   . GERD (gastroesophageal reflux disease)    occasional  . Glaucoma   . Hyperlipidemia    statin intolerant   Past Surgical History:  Past Surgical History:  Procedure Laterality Date  . EYE SURGERY     bilateral cataracts  . KNEE ARTHROSCOPY W/ DEBRIDEMENT     left   . RETINAL DETACHMENT SURGERY Bilateral   . TOTAL KNEE ARTHROPLASTY Left 01/01/2018   Procedure: LEFT TOTAL KNEE ARTHROPLASTY;  Surgeon: Gaynelle Arabian, MD;  Location: [facility];  Service: Orthopedics;  Laterality: Left;  50 mins  . TOTAL KNEE ARTHROPLASTY Right 06/25/2018   Procedure: TOTAL KNEE ARTHROPLASTY;  Surgeon: Gaynelle Arabian, MD;  Location: [facility];  Service: Orthopedics;  Laterality: Right;  42min  . VASECTOMY     HPI:  Pt is a 74 y.o. male with past medical history of paroxysmal atrial fibrillation, hyperlipidemia, anemia who presented to the ED with acute onset of right-sided weakness, aphasia, and slurred speech. MRI of the brain revealed 2 cm acute infarct within the subcortical white matter of the mid left frontal lobe. Pt's wife reported that if he eats too fast he gets the hiccups and pt stated that he coughs if he takes too large of a gulp.    Assessment / Plan / Recommendation Clinical Impression  Pt was seen for bedside swallow evaluation with his wife present. Pt reported that he coughs with liquids when he takes too large of a sip and his wife reported  that he clears his throat frequently but both parties were unsure as to whether this occurs more frequently during meals. Oral mechanism exam revealed right-sided lingual and labial weakness. Dentition was natural and adequate. He inconsistently demonstrated coughing and throat clearing with thin liquids via straw but tolerated thin liquids via cup with reduced symptoms. Throat clearing was also inconsistently noted with purees but all other solids and liquids were tolerated without symptoms of oropharyngeal dysphagia. A modified barium swallow study is recommended to further assess the functional integrity of the swallow mechanism.  SLP Visit Diagnosis: Dysphagia, pharyngeal phase (R13.13)    Aspiration Risk  Mild aspiration risk    Diet Recommendation Regular;Thin liquid   Liquid Administration via: Cup;No straw Medication Administration: Whole meds with puree Supervision: Patient able to self feed Compensations: Slow rate;Small sips/bites    Other  Recommendations Oral Care Recommendations: Oral care BID   Follow up Recommendations Other (comment)(Continued SLP services at level of care recommended by PT/OT)      Frequency and Duration min 2x/week  2 weeks       Prognosis Prognosis for Safe Diet Advancement: Good      Swallow Study   General Date of Onset: 08/07/19 HPI: Pt is a 74 y.o. male with past medical history of paroxysmal atrial fibrillation, hyperlipidemia, anemia who presented to the ED with acute onset of right-sided weakness, aphasia,  and slurred speech. MRI of the brain revealed 2 cm acute infarct within the subcortical white matter of the mid left frontal lobe. Pt's wife reported that if he eats too fast he gets the hiccups and pt stated that he coughs if he takes too large of a gulp.  Type of Study: Bedside Swallow Evaluation Previous Swallow Assessment: None Diet Prior to this Study: Regular;Thin liquids Temperature Spikes Noted: No Respiratory Status: Room  air History of Recent Intubation: No Behavior/Cognition: Alert;Cooperative;Pleasant mood Oral Cavity Assessment: Within Functional Limits Oral Care Completed by SLP: Yes Oral Cavity - Dentition: Adequate natural dentition Vision: Functional for self-feeding Self-Feeding Abilities: Able to feed self Patient Positioning: Upright in bed;Postural control adequate for testing Baseline Vocal Quality: Normal Volitional Cough: Strong Volitional Swallow: Able to elicit    Oral/Motor/Sensory Function Overall Oral Motor/Sensory Function: Mild impairment Facial ROM: Reduced right;Suspected CN VII (facial) dysfunction Facial Symmetry: Within Functional Limits Facial Strength: Reduced right Facial Sensation: Within Functional Limits Lingual ROM: Within Functional Limits Lingual Symmetry: Within Functional Limits Lingual Strength: Reduced Lingual Sensation: Within Functional Limits Velum: Within Functional Limits Mandible: Within Functional Limits   Ice Chips Ice chips: Within functional limits Presentation: Spoon   Thin Liquid Thin Liquid: Impaired Presentation: Straw;Spoon;Cup Pharyngeal  Phase Impairments: Throat Clearing - Immediate;Throat Clearing - Delayed;Cough - Immediate(Inconsistent with straw; not noted with cup)    Nectar Thick Nectar Thick Liquid: Not tested   Honey Thick Honey Thick Liquid: Not tested   Puree Puree: Within functional limits Presentation: Spoon   Solid     Solid: Within functional limits Presentation: [place] I. Hardin Negus, [place], [place] Office number [phone] Pager [phone]  Horton Marshall 08/07/2019,12:31 PM

## 2019-08-07 NOTE — Progress Notes (Signed)
STROKE TEAM PROGRESS NOTE   INTERVAL HISTORY I have personally reviewed history of presenting illness, electronic medical records and imaging films in PACS.  Patient presented with transient episode of expressive aphasia and has known history of paroxysmal A. fib.  MRI scan shows a large left subcortical infarct.  CT angiogram showed no significant large vessel stenosis or occlusion.  LDL cholesterol is 106 mg percent and hemoglobin A1c is 5.8.  Echocardiogram is pending.  Vitals:   08/06/19 2305 08/07/19 0106 08/07/19 0313 08/07/19 0805  BP: (!) 173/116 (!) 144/97 (!) 140/99 (!) 128/93  Pulse: 90 95 94 67  Resp: 16 18 16 18   Temp: 97.7 F (36.5 C) 98.4 F (36.9 C) 98.1 F (36.7 C) 98 F (36.7 C)  TempSrc: Oral Oral Oral Oral  SpO2: 97% 96% 97% 99%  Weight:      Height:        CBC:  Recent Labs  Lab 08/06/19 1315 08/06/19 1315 08/06/19 1330 08/07/19 0614  WBC 7.0  --   --  7.1  NEUTROABS 3.6  --   --   --   HGB 14.9   < > 15.6 14.8  HCT 46.9   < > 46.0 44.6  MCV 95.7  --   --  92.7  PLT 241  --   --  235   < > = values in this interval not displayed.    Basic Metabolic Panel:  Recent Labs  Lab 08/06/19 1315 08/06/19 1315 08/06/19 1330 08/07/19 0614  NA 142   < > 141 141  K 3.6   < > 3.6 3.6  CL 107   < > 104 105  CO2 27  --   --  27  GLUCOSE 89   < > 82 95  BUN 25*   < > 25* 14  CREATININE 0.96   < > 0.90 0.79  CALCIUM 8.5*  --   --  8.6*  MG  --   --   --  2.0  PHOS  --   --   --  4.0   < > = values in this interval not displayed.   Lipid Panel:     Component Value Date/Time   CHOL 175 08/07/2019 0614   CHOL 177 03/14/2018 0811   TRIG 109 08/07/2019 0614   HDL 47 08/07/2019 0614   HDL 48 03/14/2018 0811   CHOLHDL 3.7 08/07/2019 0614   VLDL 22 08/07/2019 0614   LDLCALC 106 (H) 08/07/2019 0614   LDLCALC 114 (H) 03/14/2018 0811   HgbA1c:  Lab Results  Component Value Date   HGBA1C 5.8 (H) 08/07/2019   Urine Drug Screen:     Component Value  Date/Time   LABOPIA NONE DETECTED 08/06/2019 1315   COCAINSCRNUR NONE DETECTED 08/06/2019 1315   LABBENZ NONE DETECTED 08/06/2019 1315   AMPHETMU NONE DETECTED 08/06/2019 1315   THCU NONE DETECTED 08/06/2019 1315   LABBARB NONE DETECTED 08/06/2019 1315    Alcohol Level     Component Value Date/Time   ETH <10 08/06/2019 1315    IMAGING past 24 hours CT Angio Head W or Wo Contrast  Result Date: 08/06/2019 CLINICAL DATA:  Code stroke. 74 year old male with aphasia and right arm weakness onset 1245 hours. History of atrial fibrillation not on anticoagulation. EXAM: CT ANGIOGRAPHY HEAD AND NECK TECHNIQUE: Multidetector CT imaging of the head and neck was performed using the standard protocol during bolus administration of intravenous contrast. Multiplanar CT image reconstructions and MIPs were  obtained to evaluate the vascular anatomy. Carotid stenosis measurements (when applicable) are obtained utilizing NASCET criteria, using the distal internal carotid diameter as the denominator. CONTRAST:  132mL OMNIPAQUE IOHEXOL 350 MG/ML SOLN COMPARISON:  Plain head CT 1316 hours today. FINDINGS: CTA NECK Skeleton: Upper cervical spine ankylosis and lower cervical spine degeneration. No acute osseous abnormality identified. Upper chest: Negative upper lungs. There is some enlargement of the central pulmonary arteries. A small volume of fluid is suspected in the superior pericardial recess. No superior mediastinal lymphadenopathy. Other neck: Negative, no neck mass or lymphadenopathy. Aortic arch: 3 vessel arch configuration with minimal arch atherosclerosis. Right carotid system: Mildly tortuous right CCA without plaque or stenosis. Mild plaque at the right ICA origin and bulb without stenosis. Just below the skull base there is a small 2-3 mm saccular outpouching of the cervical right ICA directed anteriorly (series 10, image 89). But no other regional vessel irregularity. Left carotid system: Tortuous proximal  left CCA. Mild plaque at the left ICA origin and bulb without stenosis. Tortuous left ICA, dolichoectatic at the C1 level up to 62 mm. But no vessel irregularity. Vertebral arteries: Mild proximal right subclavian artery plaque without stenosis. Normal right vertebral artery origin. Dominant right vertebral artery is patent to the skull base without stenosis. Mild proximal left subclavian artery plaque without stenosis. Non dominant left vertebral artery origin is normal. Mildly tortuous left V1 segment. The left vertebral remains non dominant to the skull base. There is mild left V3 plaque without stenosis. CTA HEAD Posterior circulation: The left vertebral V4 segment is diminutive beyond the patent left PICA origin. The right V4 primarily supplies the basilar. Normal right PICA origin. Tortuous vertebrobasilar junction without stenosis. Patent basilar artery without stenosis. Normal SCA and PCA origins. Small right posterior communicating artery, the left is diminutive or absent. The left P1 is mildly tortuous. Bilateral PCA branches are within normal limits. Anterior circulation: Both ICA siphons are patent with minimal plaque and no stenosis. Normal ophthalmic artery origins. Patent carotid termini. Normal MCA and ACA origins. Anterior communicating artery and bilateral ACA branches are within normal limits. Right MCA M1 segment and bifurcation are patent without stenosis. Right MCA branches are within normal limits. Left MCA M1 segment and bifurcation are patent without stenosis. Left MCA branches are within normal limits. No branch occlusion identified. Venous sinuses: Patent. Anatomic variants: Dominant right vertebral artery. Review of the MIP images confirms the above findings IMPRESSION: 1. Negative for large vessel occlusion, and mild for age atherosclerosis in the head and neck. 2. Both cervical ICAs are abnormal just below the skull base, with a tiny 2-3 mm pseudoaneurysm on the right and dolichoectasia  on the left. Perhaps this reflects underlying Fibromuscular Dysplasia (FMD), although no other vessel irregularity is identified. Study discussed by telephone with Dr. Blanchie Dessert on 08/06/2019 at 13:41 . Electronically Signed   By: Genevie Ann M.D.   On: 08/06/2019 13:45   CT Head Wo Contrast  Result Date: 08/06/2019 CLINICAL DATA:  74 year old male code stroke presentation earlier today, symptoms now improving. History of atrial fibrillation not on anticoagulation. EXAM: CT HEAD WITHOUT CONTRAST TECHNIQUE: Contiguous axial images were obtained from the base of the skull through the vertex without intravenous contrast. COMPARISON:  CT head 1317 hours today.  CTA head and neck. FINDINGS: Brain: Some intravascular contrast remains present. No midline shift, ventriculomegaly, mass effect, evidence of mass lesion, intracranial hemorrhage or evidence of cortically based acute infarction. Gray-white matter differentiation is within normal  limits throughout the brain. No abnormal enhancement is evident. Vascular: Mild Calcified atherosclerosis at the skull base. Skull: No acute osseous abnormality identified. Sinuses/Orbits: Visualized paranasal sinuses and mastoids are stable and well pneumatized. Other: No acute orbit or scalp soft tissue finding. IMPRESSION: Stable since 1316 hours and normal for age CT appearance of the brain. Electronically Signed   By: Genevie Ann M.D.   On: 08/06/2019 15:21   CT Angio Neck W and/or Wo Contrast  Result Date: 08/06/2019 CLINICAL DATA:  Code stroke. 74 year old male with aphasia and right arm weakness onset 1245 hours. History of atrial fibrillation not on anticoagulation. EXAM: CT ANGIOGRAPHY HEAD AND NECK TECHNIQUE: Multidetector CT imaging of the head and neck was performed using the standard protocol during bolus administration of intravenous contrast. Multiplanar CT image reconstructions and MIPs were obtained to evaluate the vascular anatomy. Carotid stenosis measurements (when  applicable) are obtained utilizing NASCET criteria, using the distal internal carotid diameter as the denominator. CONTRAST:  129mL OMNIPAQUE IOHEXOL 350 MG/ML SOLN COMPARISON:  Plain head CT 1316 hours today. FINDINGS: CTA NECK Skeleton: Upper cervical spine ankylosis and lower cervical spine degeneration. No acute osseous abnormality identified. Upper chest: Negative upper lungs. There is some enlargement of the central pulmonary arteries. A small volume of fluid is suspected in the superior pericardial recess. No superior mediastinal lymphadenopathy. Other neck: Negative, no neck mass or lymphadenopathy. Aortic arch: 3 vessel arch configuration with minimal arch atherosclerosis. Right carotid system: Mildly tortuous right CCA without plaque or stenosis. Mild plaque at the right ICA origin and bulb without stenosis. Just below the skull base there is a small 2-3 mm saccular outpouching of the cervical right ICA directed anteriorly (series 10, image 89). But no other regional vessel irregularity. Left carotid system: Tortuous proximal left CCA. Mild plaque at the left ICA origin and bulb without stenosis. Tortuous left ICA, dolichoectatic at the C1 level up to 62 mm. But no vessel irregularity. Vertebral arteries: Mild proximal right subclavian artery plaque without stenosis. Normal right vertebral artery origin. Dominant right vertebral artery is patent to the skull base without stenosis. Mild proximal left subclavian artery plaque without stenosis. Non dominant left vertebral artery origin is normal. Mildly tortuous left V1 segment. The left vertebral remains non dominant to the skull base. There is mild left V3 plaque without stenosis. CTA HEAD Posterior circulation: The left vertebral V4 segment is diminutive beyond the patent left PICA origin. The right V4 primarily supplies the basilar. Normal right PICA origin. Tortuous vertebrobasilar junction without stenosis. Patent basilar artery without stenosis. Normal  SCA and PCA origins. Small right posterior communicating artery, the left is diminutive or absent. The left P1 is mildly tortuous. Bilateral PCA branches are within normal limits. Anterior circulation: Both ICA siphons are patent with minimal plaque and no stenosis. Normal ophthalmic artery origins. Patent carotid termini. Normal MCA and ACA origins. Anterior communicating artery and bilateral ACA branches are within normal limits. Right MCA M1 segment and bifurcation are patent without stenosis. Right MCA branches are within normal limits. Left MCA M1 segment and bifurcation are patent without stenosis. Left MCA branches are within normal limits. No branch occlusion identified. Venous sinuses: Patent. Anatomic variants: Dominant right vertebral artery. Review of the MIP images confirms the above findings IMPRESSION: 1. Negative for large vessel occlusion, and mild for age atherosclerosis in the head and neck. 2. Both cervical ICAs are abnormal just below the skull base, with a tiny 2-3 mm pseudoaneurysm on the right and dolichoectasia  on the left. Perhaps this reflects underlying Fibromuscular Dysplasia (FMD), although no other vessel irregularity is identified. Study discussed by telephone with Dr. Blanchie Dessert on 08/06/2019 at 13:41 . Electronically Signed   By: Genevie Ann M.D.   On: 08/06/2019 13:45   MR BRAIN WO CONTRAST  Result Date: 08/06/2019 CLINICAL DATA:  Neuro deficit, acute, stroke suspected. Additional history provided: 74 year old male with history of paroxysmal atrial fibrillation presenting with acute onset right-sided weakness and speech disturbance. EXAM: MRI HEAD WITHOUT CONTRAST TECHNIQUE: Multiplanar, multiecho pulse sequences of the brain and surrounding structures were obtained without intravenous contrast. COMPARISON:  Noncontrast CT head and CT angiogram head/neck performed earlier the same day 08/06/2019. FINDINGS: Brain: Mild intermittent motion degradation. There is a 2 cm focus of  restricted diffusion within the subcortical white matter of the mid left frontal lobe consistent with acute infarction (series 7, images 45 and 46) (series 13, image 55). No evidence of acute infarct elsewhere within the brain. No evidence of intracranial mass. No midline shift or extra-axial fluid collection. No chronic intracranial blood products. No significant white matter disease for age. Cerebral volume is normal for age. Vascular: Flow voids maintained within the proximal large arterial vessels. Skull and upper cervical spine: No focal marrow lesion. Sinuses/Orbits: Visualized orbits demonstrate no acute abnormality. Moderate ethmoid sinus mucosal thickening. Mild mucosal thickening is also present within the inferior right maxillary sinus. No significant mastoid effusion. IMPRESSION: 1. Mildly motion degraded examination. 2. 2 cm acute infarct within the subcortical white matter of the mid left frontal lobe. 3. Otherwise unremarkable MRI appearance of the brain for age. 4. Paranasal sinus disease as described. Electronically Signed   By: Kellie Simmering DO   On: 08/06/2019 17:22   CT HEAD CODE STROKE WO CONTRAST  Result Date: 08/06/2019 CLINICAL DATA:  Code stroke. 74 year old male with aphasia and right arm weakness onset 1245 hours. History of atrial fibrillation. EXAM: CT HEAD WITHOUT CONTRAST TECHNIQUE: Contiguous axial images were obtained from the base of the skull through the vertex without intravenous contrast. COMPARISON:  None. FINDINGS: Brain: Cerebral volume is within normal limits for age. No midline shift, ventriculomegaly, mass effect, evidence of mass lesion, intracranial hemorrhage or evidence of cortically based acute infarction. Gray-white matter differentiation is within normal limits throughout the brain. Vascular: Mild Calcified atherosclerosis at the skull base. No suspicious intracranial vascular hyperdensity. Skull: No acute osseous abnormality identified. Sinuses/Orbits: Visualized  paranasal sinuses and mastoids are well pneumatized. Other: Postoperative changes to the globes. No acute orbit or scalp soft tissue finding. ASPECTS [facility] Stroke Program Early CT Score) Total score (0-10 with 10 being normal): 10 IMPRESSION: Normal for age non contrast CT appearance of the brain.  ASPECTS 10. Study discussed by telephone with Dr. Blanchie Dessert on 08/06/2019 at 13:276 . Electronically Signed   By: Genevie Ann M.D.   On: 08/06/2019 13:28    PHYSICAL EXAM Pleasant elderly Caucasian male not in distress. . Afebrile. Head is nontraumatic. Neck is supple without bruit.    Cardiac exam no murmur or gallop. Lungs are clear to auscultation. Distal pulses are well felt. Neurological Exam ;  Awake  Alert oriented x 3.  Mildly dysarthric speech with word hesitancy and nonfluent speech..eye movements full without nystagmus.fundi were not visualized. Vision acuity and fields appear normal. Hearing is normal. Palatal movements are normal. Face asymmetric with right lower facial weakness.. Tongue midline. Normal strength, tone, reflexes and coordination.  Diminished fine finger movements on the right.  Orbits left  over right upper extremity.  Normal sensation. Gait deferred.  NIHSS 3 Premorbid MRS 0 ASSESSMENT/PLAN Mr. Aurthur Joshua Moran. is a 74 y.o. male with history of atrial fibrillation not on AC d/t low CHADSVASC score presenting with recurrent aphasia.   Stroke:   L frontal lobe infarct embolic in setting of known AF not on [facility]  Code Stroke CT head No acute abnormality. ASPECTS 10.     CTA head & neck no LVO, mild atherosclerosis head and neck. 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 Sinus dz  2D Echo pending  LDL 106  HgbA1c 5.8  Eliquis for VTE prophylaxis  aspirin 81 mg daily prior to admission, now on Eliquis (apixaban) daily. Continue Eliquis at d/c  Therapy  recommendations:  SLP, pending   Disposition:  pending   Atrial Fibrillation  Home anticoagulation:  none d/t CHADSVASC score of 1   CHA2DS2-VASc Score = 3, ?2 oral anticoagulation recommended  Age in Years:  55-74   +1   Sex:  Male   0    Hypertension History:  0     Diabetes Mellitus:  0  Congestive Heart Failure History:  0  Vascular Disease History:  0     Stroke/TIA/Thromboembolism History:  yes   +2 . Continue Eliquis (apixaban) daily at discharge   Hyperlipidemia  Home meds:  crestor 5 MWF, resumed in hospital   Intolerant to multiple statins in the past with myalgias in the legs   LDL 106, goal < 70  Consider PCSK-9 as an OP for more optimal cholesterol control  Continue statin at discharge  Possible Dysphagia . Passed swallow screen . Significant Coughing during rounds . On full liquid diet ->Made NPO . Ask SLP to assess swallow   Other Stroke Risk Factors  Advanced age  ETOH use, alcohol level <10, advised to drink no more than 2 drink(s) a day  Obesity, Body mass index is 38.45 kg/m., recommend weight loss, diet and exercise as appropriate   Hospital day # 1  I have personally obtained history,examined this patient, reviewed notes, independently viewed imaging studies, participated in medical decision making and plan of care.ROS completed by me personally and pertinent positives fully documented  I have made any additions or clarifications directly to the above note.  He presented with expressive language difficulties secondary to left brain infarct likely embolic from atrial fibrillation.  Agree with long-term anticoagulation with Eliquis and continue ongoing stroke work-up and aggressive risk factor modification.  Patient also appears to be at high risk for sleep apnea and may benefit with possible consideration to participate in the sleep smart stroke prevention trial.  He will be given written information to review and decide.  Discussed with patient and  Dr. Ilona Sorrel.  Greater than 50% time during this 35-minute visit was spent on counseling and coordination of care about his embolic stroke, atrial fibrillation and discussion about prevention treatment and answering questions.  Antony Contras, MD Medical Director [facility] Pager: [phone] 08/07/2019 3:26 PM   To contact Stroke Continuity provider, please refer to [email or web address] After hours, contact General Neurology

## 2019-08-07 NOTE — TOC Initial Note (Signed)
Transition of Care (TOC) - Initial/Assessment Note    Patient Details  Name: Joshua Moran. MRN: [id number] Date of Birth: 09/15/1945  Transition of Care [facility]) CM/SW Contact:    Pollie Friar, RN Phone Number: 08/07/2019, 2:34 PM  Clinical Narrative:                 Pt is off unit for testing. CM met with the patient wife at the bedside. CM updated her on the $45/ month co pay for Eliquis and she states they can afford this cost. CM will provide 30 day free card prior to d/c home. Awaiting PT/OT evals.  TOC following.    Barriers to Discharge: Continued Medical Work up   Patient Goals and CMS Choice        Expected Discharge Plan and Services     Discharge Planning Services: CM Consult   Living arrangements for the past 2 months: [place]                                      Prior Living Arrangements/Services Living arrangements for the past 2 months: Single Family Home Lives with:: Spouse Patient language and need for interpreter reviewed:: Yes Do you feel safe going back to the place where you live?: Yes          Current home services: DME(walker, cane, shower seat) Criminal Activity/Legal Involvement Pertinent to Current Situation/Hospitalization: No - Comment as needed  Activities of Daily Living Home Assistive Devices/Equipment: None ADL Screening (condition at time of admission) Patient's cognitive ability adequate to safely complete daily activities?: Yes Is the patient deaf or have difficulty hearing?: No Does the patient have difficulty seeing, even when wearing glasses/contacts?: No Does the patient have difficulty concentrating, remembering, or making decisions?: No Patient able to express need for assistance with ADLs?: Yes Does the patient have difficulty dressing or bathing?: No Independently performs ADLs?: Yes (appropriate for developmental age) Does the patient have difficulty walking or climbing stairs?: No Weakness of  Legs: None Weakness of Arms/Hands: None  Permission Sought/Granted                  Emotional Assessment           Psych Involvement: No (comment)  Admission diagnosis:  Aphasia [R47.01] TIA (transient ischemic attack) [G45.9] Acute ischemic stroke [facility]) [I63.9] Patient Active Problem List   Diagnosis Date Noted  . TIA (transient ischemic attack) 08/06/2019  . Acute ischemic stroke ([place]) 08/06/2019  . Obesity 07/24/2018  . Paroxysmal atrial fibrillation ([place]) 04/24/2018  . Chest pain 04/24/2018  . OA (osteoarthritis) of knee 01/01/2018  . Dyspnea 09/19/2017  . Primary open angle glaucoma of right eye, indeterminate stage 06/10/2015  . Dyslipidemia, goal LDL below 100 05/05/2015  . Abnormal CT scan of heart 05/05/2015  . Recent retinal detachment, partial, with giant tear, bilateral 11/24/2011  . Status post intraocular lens implant 11/24/2011   PCP:  Lawerance Cruel, MD Pharmacy:   [facility] - [place], [place] - [address] [place] [address] Phone: [phone] Fax: [phone]     Social Determinants of Health (SDOH) Interventions    Readmission Risk Interventions No flowsheet data found.

## 2019-08-07 NOTE — Progress Notes (Signed)
Transitions of Care Pharmacist Note  Joshua Moran. is a 74 y.o. male that has been diagnosed with A Fib and will be prescribed Eliquis (apixaban) at discharge. Pt okay with $45.00 copay for Eliquis  Patient Education: I provided the following education on Apixaban 08/07/19 to the patient: How to take the medication Described what the medication is Signs of bleeding Signs/symptoms of VTE and stroke  Answered their questions  Discharge Medications Plan: The patient wants to have their discharge medications filled by the Transitions of Care pharmacy rather than their usual pharmacy.  The discharge orders pharmacy has been changed to the Transitions of Care pharmacy, the patient will receive a phone call regarding co-pay, and their medications will be delivered by the Transitions of Care pharmacy.    Thank you,   Sherren Kerns, PharmD PGY1 Acute Care Pharmacy Resident August 07, 2019

## 2019-08-07 NOTE — TOC Benefit Eligibility Note (Signed)
Transition of Care [facility]) Benefit Eligibility Note    Patient Details  Name: Joshua Moran. MRN: [id number] Date of Birth: [birth year]   Medication/Dose: Eliquis 5 mg BID  Covered?: Yes     Prescription Coverage Preferred Pharmacy: Any retail Pharmacy  Spoke with Person/Company/Phone Number:: [employer]/ [phone]  Co-Pay: 45.00 for a 30 day supply retail /90.00 for a 90 day supply Mail Order  Prior Approval: No  Deductible: (No Deductible)       Orbie Pyo Phone Number: 08/07/2019, 9:46 AM

## 2019-08-07 NOTE — Progress Notes (Signed)
Modified Barium Swallow Progress Note  Patient Details  Name: Joshua Moran. MRN: [id number] Date of Birth: Sep 10, 1945  Today's Date: 08/07/2019  Modified Barium Swallow completed.  Full report located under Chart Review in the Imaging Section.  Brief recommendations include the following:  Clinical Impression  Patient presents with mild oropharyngeal dysphagia across consistencies. Oral phase is remarkable for reduced lingual control resulting in decreased bolus cohesion and premature spillage to the vallecula and pyriform sinsues, and lingual residue. Premature spillage in the pyriform sinuses ultimately resulted in trace silent aspiration during the swallow, as the patient took large consecutive sips. As the patient took cup sips, he was noted with trace penetration (PAS 2-3), but no aspiration. He also intermittently cleared his throat, which helped protect his airway. Chin tuck was attempted, but ultimately did not prevent aspiration. Pt also had decreased BOT retraction resulting in vallecula and pyriform sinus residue that was reduced as the pt took multiple dry swallows, or used a liquid wash. Recommend thin liquids and regular solid diet, with compensatory strategies (no straws, multiple dry swallows, clear throat intermittently).   Swallow Evaluation Recommendations       SLP Diet Recommendations: Regular solids;Thin liquid   Liquid Administration via: No straw;Cup   Medication Administration: Whole meds with puree   Supervision: Patient able to self feed;Intermittent supervision to cue for compensatory strategies   Compensations: Minimize environmental distractions;Slow rate;Small sips/bites;Multiple dry swallows after each bite/sip;Clear throat intermittently   Postural Changes: Remain semi-upright after after feeds/meals (Comment);Seated upright at 90 degrees   Oral Care Recommendations: Oral care BID        Aline August, Student SLP Office: [phone]    08/07/2019,3:09 PM

## 2019-08-07 NOTE — Evaluation (Signed)
Speech Language Pathology Evaluation Patient Details Name: Joshua Moran. MRN: [id number] DOB: 05/31/1946 Today's Date: 08/07/2019 Time: [id number] SLP Time Calculation (min) (ACUTE ONLY): 16 min  Problem List:  Patient Active Problem List   Diagnosis Date Noted  . TIA (transient ischemic attack) 08/06/2019  . Acute ischemic stroke ([place]) 08/06/2019  . Obesity 07/24/2018  . Paroxysmal atrial fibrillation ([place]) 04/24/2018  . Chest pain 04/24/2018  . OA (osteoarthritis) of knee 01/01/2018  . Dyspnea 09/19/2017  . Primary open angle glaucoma of right eye, indeterminate stage 06/10/2015  . Dyslipidemia, goal LDL below 100 05/05/2015  . Abnormal CT scan of heart 05/05/2015  . Recent retinal detachment, partial, with giant tear, bilateral 11/24/2011  . Status post intraocular lens implant 11/24/2011   Past Medical History:  Past Medical History:  Diagnosis Date  . Abnormal screening CT of chest    coronary calcium in left main and RCA  . Anemia   . Arthritis   . Complication of anesthesia    Pt reports difficult urinating  . Dyspnea    with some activity  . Dysrhythmia    a- fib   . GERD (gastroesophageal reflux disease)    occasional  . Glaucoma   . Hyperlipidemia    statin intolerant   Past Surgical History:  Past Surgical History:  Procedure Laterality Date  . EYE SURGERY     bilateral cataracts  . KNEE ARTHROSCOPY W/ DEBRIDEMENT     left   . RETINAL DETACHMENT SURGERY Bilateral   . TOTAL KNEE ARTHROPLASTY Left 01/01/2018   Procedure: LEFT TOTAL KNEE ARTHROPLASTY;  Surgeon: Gaynelle Arabian, MD;  Location: [facility] ORS;  Service: Orthopedics;  Laterality: Left;  50 mins  . TOTAL KNEE ARTHROPLASTY Right 06/25/2018   Procedure: TOTAL KNEE ARTHROPLASTY;  Surgeon: Gaynelle Arabian, MD;  Location: [facility] ORS;  Service: Orthopedics;  Laterality: Right;  34min  . VASECTOMY     HPI:  Pt is a 74 y.o. male with past medical history of paroxysmal atrial fibrillation, hyperlipidemia, anemia  who presented to the ED with acute onset of right-sided weakness, aphasia, and slurred speech. MRI of the brain revealed 2 cm acute infarct within the subcortical white matter of the mid left frontal lobe.   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 He indicated that he had some difficulty with memory at baseline but he denied any other baseline deficits in speech, language, or cognition. His language skills are currently within normal limits and his cognitive-linguistic skills were functional with some noted difficulty with memory. He demonstrated mild dysarthria characterized by imprecise articulation secondary to oral-motor weakness. Skilled SLP services are clinically indicated at this time to improve dysarthria.    SLP Assessment  SLP Recommendation/Assessment: Patient needs continued Speech Lanaguage Pathology Services SLP Visit Diagnosis: Dysarthria and anarthria (R47.1)    Follow Up Recommendations  Other (comment)(Continued SLP services at level of care recommended by PT/OT)    Frequency and Duration min 2x/week  2 weeks      SLP Evaluation Cognition  Overall Cognitive Status: Within Functional Limits for tasks assessed Arousal/Alertness: Awake/alert Orientation Level: Oriented X4 Attention: Focused;Sustained Focused Attention: Appears intact Sustained Attention: Appears intact Memory: Impaired Memory Impairment: Storage deficit;Retrieval deficit(Immediate: 3/3; Delayed: 1/3; with cues: 2/2) Awareness: Appears intact Problem Solving: Appears intact Executive Function: Reasoning Reasoning: Appears intact       Comprehension  Auditory Comprehension Overall Auditory Comprehension: Appears within functional limits for  tasks assessed Yes/No Questions: Within Functional Limits Basic Immediate Environment Questions: (5/85) Complex Questions: (5/5) Paragraph Comprehension (via yes/no questions):  (3/4) Commands: Within Functional Limits Two Step Basic Commands: (4/4) Multistep Basic Commands: (4/4) Conversation: Complex Visual Recognition/Discrimination Discrimination: Not tested Reading Comprehension Reading Status: Within funtional limits    Expression Expression Primary Mode of Expression: Verbal Verbal Expression Initiation: No impairment Automatic Speech: Counting;Day of week;Month of year(WNL) Level of Generative/Spontaneous Verbalization: Conversation Repetition: No impairment(5/5) Naming: No impairment Responsive: (5/5) Confrontation: Within functional limits(10/10) Convergent: (5/5) Divergent: Not tested Pragmatics: No impairment   Oral / Motor  Oral Motor/Sensory Function Overall Oral Motor/Sensory Function: Mild impairment Facial ROM: Reduced right;Suspected CN VII (facial) dysfunction Facial Symmetry: Within Functional Limits Facial Strength: Reduced right Facial Sensation: Within Functional Limits Lingual ROM: Within Functional Limits Lingual Symmetry: Within Functional Limits Lingual Strength: Reduced Lingual Sensation: Within Functional Limits Velum: Within Functional Limits Mandible: Within Functional Limits Motor Speech Overall Motor Speech: Impaired Respiration: Within functional limits Phonation: Normal Resonance: Within functional limits Articulation: Impaired Level of Impairment: Sentence Intelligibility: Intelligibility reduced Word: 75-100% accurate Phrase: 75-100% accurate Sentence: 75-100% accurate Conversation: 50-74% accurate Motor Planning: Witnin functional limits Motor Speech Errors: Aware   Amador Braddy I. Hardin Negus, [place], [place] Office number [phone] Pager [place] 08/07/2019, 9:26 AM

## 2019-08-08 ENCOUNTER — Telehealth: Payer: Self-pay | Admitting: Cardiovascular Disease

## 2019-08-08 MED ORDER — APIXABAN 5 MG PO TABS: 5.0000 mg | ORAL_TABLET | Freq: Two times a day (BID) | ORAL | 0 refills | Status: AC

## 2019-08-08 MED FILL — ELIQUIS 5 MG TABLET: 5 | 30 days supply | Qty: 60 | Fill #0

## 2019-08-08 NOTE — Telephone Encounter (Signed)
New message   Per patient's wife the patient would like to be released from Dr. Gwenlyn Found and now see Dr. Radford Pax. Please advise.

## 2019-08-08 NOTE — Progress Notes (Signed)
SLP Cancellation Note  Patient Details Name: Joshua Moran. MRN: [id number] DOB: 01-15-1946   Cancelled treatment:       Reason Eval/Treat Not Completed: Patient at procedure or test/unavailable(Pt was approached for treatment but was already dressed for discharge and reported that his wife was parking the car. SLP treatment was therefore not conducted.)  Mckinleigh Schuchart I. Hardin Negus, [place], [place] Office number [phone] Pager [phone]  Horton Marshall 08/08/2019, 2:32 PM

## 2019-08-08 NOTE — Progress Notes (Signed)
Patient discharged home via wheelchair with wife. ?

## 2019-08-08 NOTE — Progress Notes (Signed)
STROKE TEAM PROGRESS NOTE   INTERVAL HISTORY Patient is sitting up in bed.  2D echo is unremarkable.  He decided not to participate in the sleep apnea study.  His wife is sitting at the bedside.  He has no complaints today.  He wants to go home.  He states his speech is a lot improved and is able to speak more clearly with less hesitancy  Vitals:   08/08/19 0800 08/08/19 1140 08/08/19 1318 08/08/19 1328  BP: (!) 144/98 (!) 144/102    Pulse: 82 61    Resp: 16 19 (!) 29 16  Temp: 97.6 F (36.4 C) 98.3 F (36.8 C)    TempSrc: Oral Oral    SpO2: 94% 95%    Weight:      Height:        CBC:  Recent Labs  Lab 08/06/19 1315 08/06/19 1315 08/06/19 1330 08/07/19 0614  WBC 7.0  --   --  7.1  NEUTROABS 3.6  --   --   --   HGB 14.9   < > 15.6 14.8  HCT 46.9   < > 46.0 44.6  MCV 95.7  --   --  92.7  PLT 241  --   --  235   < > = values in this interval not displayed.    Basic Metabolic Panel:  Recent Labs  Lab 08/06/19 1315 08/06/19 1315 08/06/19 1330 08/07/19 0614  NA 142   < > 141 141  K 3.6   < > 3.6 3.6  CL 107   < > 104 105  CO2 27  --   --  27  GLUCOSE 89   < > 82 95  BUN 25*   < > 25* 14  CREATININE 0.96   < > 0.90 0.79  CALCIUM 8.5*  --   --  8.6*  MG  --   --   --  2.0  PHOS  --   --   --  4.0   < > = values in this interval not displayed.   Lipid Panel:     Component Value Date/Time   CHOL 175 08/07/2019 0614   CHOL 177 03/14/2018 0811   TRIG 109 08/07/2019 0614   HDL 47 08/07/2019 0614   HDL 48 03/14/2018 0811   CHOLHDL 3.7 08/07/2019 0614   VLDL 22 08/07/2019 0614   LDLCALC 106 (H) 08/07/2019 0614   LDLCALC 114 (H) 03/14/2018 0811   HgbA1c:  Lab Results  Component Value Date   HGBA1C 5.8 (H) 08/07/2019   Urine Drug Screen:     Component Value Date/Time   LABOPIA NONE DETECTED 08/06/2019 1315   COCAINSCRNUR NONE DETECTED 08/06/2019 1315   LABBENZ NONE DETECTED 08/06/2019 1315   AMPHETMU NONE DETECTED 08/06/2019 1315   THCU NONE DETECTED  08/06/2019 1315   LABBARB NONE DETECTED 08/06/2019 1315    Alcohol Level     Component Value Date/Time   ETH <10 08/06/2019 1315    IMAGING past 24 hours DG Swallowing Func-Speech Pathology  Result Date: 08/07/2019 Objective Swallowing Evaluation: Type of Study: MBS-Modified Barium Swallow Study  Patient Details Name: Joshua Moran. MRN: [id number] Date of Birth: 30-Jun-1945 Today's Date: 08/07/2019 Time: SLP Start Time (ACUTE ONLY): 1345 -SLP Stop Time (ACUTE ONLY): 1404 SLP Time Calculation (min) (ACUTE ONLY): 19 min Past Medical History: Past Medical History: Diagnosis Date . Abnormal screening CT of chest   coronary calcium in left main and RCA . Anemia  .  Arthritis  . Complication of anesthesia   Pt reports difficult urinating . Dyspnea   with some activity . Dysrhythmia   a- fib  . GERD (gastroesophageal reflux disease)   occasional . Glaucoma  . Hyperlipidemia   statin intolerant Past Surgical History: Past Surgical History: Procedure Laterality Date . EYE SURGERY    bilateral cataracts . KNEE ARTHROSCOPY W/ DEBRIDEMENT    left  . RETINAL DETACHMENT SURGERY Bilateral  . TOTAL KNEE ARTHROPLASTY Left 01/01/2018  Procedure: LEFT TOTAL KNEE ARTHROPLASTY;  Surgeon: Gaynelle Arabian, MD;  Location: [facility];  Service: Orthopedics;  Laterality: Left;  50 mins . TOTAL KNEE ARTHROPLASTY Right 06/25/2018  Procedure: TOTAL KNEE ARTHROPLASTY;  Surgeon: Gaynelle Arabian, MD;  Location: [facility];  Service: Orthopedics;  Laterality: Right;  25min . VASECTOMY   HPI: Pt is a 74 y.o. male with past medical history of paroxysmal atrial fibrillation, hyperlipidemia, anemia who presented to the ED with acute onset of right-sided weakness, aphasia, and slurred speech. MRI of the brain revealed 2 cm acute infarct within the subcortical white matter of the mid left frontal lobe. Pt's wife reported that if he eats too fast he gets the hiccups and pt stated that he coughs if he takes too large of a gulp.  Subjective: cooperative  Assessment / Plan / Recommendation CHL IP CLINICAL IMPRESSIONS 08/07/2019 Clinical Impression Patient presents with mild oropharyngeal dysphagia across consistencies. Oral phase is remarkable for reduced lingual control resulting in decreased bolus cohesion and premature spillage to the vallecula and pyriform sinsues, and lingual residue. Premature spillage in the pyriform sinuses ultimately resulted in trace silent aspiration during the swallow, as the patient took large consecutive sips. As the patient took cup sips, he was noted with trace penetration (PAS 2-3), but no aspiration. He also intermittently cleared his throat, which helped protect his airway. Chin tuck was attempted, but ultimately did not prevent aspiration. Pt also had decreased BOT retraction resulting in vallecula and pyriform sinus residue that was reduced as the pt took multiple dry swallows, or used a liquid wash. Recommend thin liquids and regular solid diet, with compensatory strategies (no straws, multiple dry swallows, clear throat intermittently). SLP Visit Diagnosis Dysphagia, oropharyngeal phase (R13.12) Attention and concentration deficit following -- Frontal lobe and executive function deficit following -- Impact on safety and function Moderate aspiration risk;Mild aspiration risk   CHL IP TREATMENT RECOMMENDATION 08/07/2019 Treatment Recommendations Therapy as outlined in treatment plan below   Prognosis 08/07/2019 Prognosis for Safe Diet Advancement Good Barriers to Reach Goals -- Barriers/Prognosis Comment -- CHL IP DIET RECOMMENDATION 08/07/2019 SLP Diet Recommendations Regular solids;Thin liquid Liquid Administration via No straw;Cup Medication Administration Whole meds with puree Compensations Minimize environmental distractions;Slow rate;Small sips/bites;Multiple dry swallows after each bite/sip;Clear throat intermittently Postural Changes Remain semi-upright after after feeds/meals (Comment);Seated upright at 90 degrees   CHL IP OTHER  RECOMMENDATIONS 08/07/2019 Recommended Consults -- Oral Care Recommendations Oral care BID Other Recommendations --   CHL IP FOLLOW UP RECOMMENDATIONS 08/07/2019 Follow up Recommendations Other (comment)   CHL IP FREQUENCY AND DURATION 08/07/2019 Speech Therapy Frequency (ACUTE ONLY) min 2x/week Treatment Duration 2 weeks      CHL IP ORAL PHASE 08/07/2019 Oral Phase Impaired Oral - Pudding Teaspoon -- Oral - Pudding Cup -- Oral - Honey Teaspoon -- Oral - Honey Cup -- Oral - Nectar Teaspoon -- Oral - Nectar Cup -- Oral - Nectar Straw -- Oral - Thin Teaspoon NT Oral - Thin Cup Lingual/palatal residue Oral - Thin Straw Decreased  bolus cohesion;Premature spillage;Lingual/palatal residue Oral - Puree WFL Oral - Mech Soft -- Oral - Regular WFL Oral - Multi-Consistency -- Oral - Pill -- Oral Phase - Comment --  CHL IP PHARYNGEAL PHASE 08/07/2019 Pharyngeal Phase Impaired Pharyngeal- Pudding Teaspoon -- Pharyngeal -- Pharyngeal- Pudding Cup -- Pharyngeal -- Pharyngeal- Honey Teaspoon -- Pharyngeal -- Pharyngeal- Honey Cup -- Pharyngeal -- Pharyngeal- Nectar Teaspoon -- Pharyngeal -- Pharyngeal- Nectar Cup -- Pharyngeal -- Pharyngeal- Nectar Straw -- Pharyngeal -- Pharyngeal- Thin Teaspoon -- Pharyngeal -- Pharyngeal- Thin Cup Pharyngeal residue - valleculae;Pharyngeal residue - pyriform;Penetration/Aspiration during swallow;Compensatory strategies attempted (with notebox) Pharyngeal Material enters airway, passes BELOW cords without attempt by patient to eject out (silent aspiration) Pharyngeal- Thin Straw Pharyngeal residue - pyriform;Pharyngeal residue - valleculae;Penetration/Aspiration during swallow;Trace aspiration Pharyngeal Material enters airway, passes BELOW cords without attempt by patient to eject out (silent aspiration) Pharyngeal- Puree Pharyngeal residue - valleculae;Pharyngeal residue - pyriform Pharyngeal -- Pharyngeal- Mechanical Soft -- Pharyngeal -- Pharyngeal- Regular Pharyngeal residue - pyriform;Pharyngeal  residue - valleculae Pharyngeal -- Pharyngeal- Multi-consistency -- Pharyngeal -- Pharyngeal- Pill -- Pharyngeal -- Pharyngeal Comment --  No flowsheet data found. Osie Bond., M.A. [facility] Pager [phone] Office [phone] 08/07/2019, 3:15 PM               PHYSICAL EXAM Pleasant elderly Caucasian male not in distress. . Afebrile. Head is nontraumatic. Neck is supple without bruit.    Cardiac exam no murmur or gallop. Lungs are clear to auscultation. Distal pulses are well felt. Neurological Exam ;  Awake  Alert oriented x 3.  Mildly dysarthric speech with word hesitancy but speech appears mostly fluent today...eye movements full without nystagmus.fundi were not visualized. Vision acuity and fields appear normal. Hearing is normal. Palatal movements are normal. Face asymmetric with right lower facial weakness.. Tongue midline. Normal strength, tone, reflexes and coordination.  Diminished fine finger movements on the right.  Orbits left over right upper extremity.  Normal sensation. Gait deferred.  NIHSS 3 Premorbid MRS 0 ASSESSMENT/PLAN Mr. Khaleem Sogge. is a 74 y.o. male with history of atrial fibrillation not on AC d/t low CHADSVASC score presenting with recurrent aphasia.   Stroke:   L frontal lobe infarct embolic in setting of known AF not on [facility]  Code Stroke CT head No acute abnormality. ASPECTS 10.     CTA head & neck no LVO, mild atherosclerosis head and neck. 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 Sinus dz  2D Echo ejection fraction 45 to 50%.  Mild left ventricular hypertrophy.  No cardiac source of embolism.    LDL 106  HgbA1c 5.8  Eliquis for VTE prophylaxis  aspirin 81 mg daily prior to admission, now on Eliquis (apixaban) daily. Continue Eliquis at d/c Therapy recommendations: No PT OT needed disposition: Home  Atrial  Fibrillation  Home anticoagulation:  none d/t CHADSVASC score of 1   CHA2DS2-VASc Score = 3, ?2 oral anticoagulation recommended  Age in Years:  96-74   +1   Sex:  Male   0    Hypertension History:  0     Diabetes Mellitus:  0  Congestive Heart Failure History:  0  Vascular Disease History:  0     Stroke/TIA/Thromboembolism History:  yes   +2 . Continue Eliquis (apixaban) daily at discharge   Hyperlipidemia  Home meds:  crestor 5 MWF, resumed in  hospital   Intolerant to multiple statins in the past with myalgias in the legs   LDL 106, goal < 70  Consider PCSK-9 as an OP for more optimal cholesterol control  Continue statin at discharge  Possible Dysphagia . Passed swallow screen . Significant Coughing during rounds . On full liquid diet ->Made NPO . Ask SLP to assess swallow   Other Stroke Risk Factors  Advanced age  ETOH use, alcohol level <10, advised to drink no more than 2 drink(s) a day  Obesity, Body mass index is 38.45 kg/m., recommend weight loss, diet and exercise as appropriate   Hospital day # 2   He presented with expressive language difficulties secondary to left brain infarct likely embolic from atrial fibrillation.  Agree with long-term anticoagulation with Eliquis and continue ongoing stroke work-up and aggressive risk factor modification.  Recommend discharge home.  Follow-up as an outpatient with stroke clinic in 6 weeks.  Stroke team will sign off.  Discussed with patient and Dr. Reesa Chew.    Antony Contras, MD Medical Director [facility] Stroke Center Pager: [phone] 08/08/2019 1:50 PM   To contact Stroke Continuity provider, please refer to [email or web address] After hours, contact General Neurology

## 2019-08-08 NOTE — Discharge Summary (Signed)
Physician Discharge Summary  Joshua Moran. [id number] DOB: 1945-11-18 DOA: 08/06/2019  PCP: Lawerance Cruel, MD  Admit date: 08/06/2019 Discharge date: 08/08/2019  Admitted From: Home Disposition: Home  Recommendations for Outpatient Follow-up:  1. Follow up with PCP in 1-2 weeks 2. Please obtain BMP/CBC in one week your next doctors visit.  3. Follow-up patient neurology in 4 weeks 4. Discontinue aspirin.  Only Eliquis.   Discharge Condition: Stable CODE STATUS: Full Diet recommendation: Cardiac  Brief/Interim Summary: 74 year old with history of paroxysmal atrial fibrillation on anticoagulation only on aspirin, HLD, anemia presented with right-sided weakness and slurred speech.  Upon admission he was diagnosed with left frontal embolic strokes.  He was transferred to [facility] for further care and management.  Stroke team was consulted and patient was started on Eliquis.  Per neurology recommendation, aspirin discontinued.  Rest of the work-up as described below.  Follow-up outpatient neurology in 4-week.  Did not have any further physical therapy needs at home as he recovered well.   Acute left frontal CVA in the setting of atrial fibrillation -CTA head and neck no large occlusion.  CT head-negative -MRI-positive for left frontal CVA -Echocardiogram-EF 45% with global hypokinesia. -LDL 106, hemoglobin A1c 5.8 -Speech therapy-mild aspiration risk, regular skin liquid -PT/OT-no follow-up needed -Outpatient neurology follow-up in 4 weeks. -Eliquis twice daily, discontinue aspirin.  History of atrial fibrillation, not on anticoagulation at home -Patient will  be on Eliquis.  Discontinue aspirin  Hyperlipidemia -On Crestor.  LDL 106.  History of intolerance to statin -On outpatient will be on PC SK-9  History of congestive heart failure with reduced ejection fraction, 45% -Also has ventricular hypokinesis, last echocardiogram 2019 which demonstrated EF of 65%  without wall motion abnormality.  He would benefit from outpatient cardiology close follow-up.  Follows outpatient Dr. Alvester Chou  Discharge Diagnoses:  Principal Problem:   Acute ischemic stroke [facility]) Active Problems:   Dyslipidemia, goal LDL below 100   Paroxysmal atrial fibrillation [facility])    Consultations:  Neurology  Subjective: Feels great, no complaints.  Discharge Exam: Vitals:   08/08/19 1140 08/08/19 1318  BP: (!) 144/102   Pulse: 61   Resp: 19 (!) 29  Temp: 98.3 F (36.8 C)   SpO2: 95%    Vitals:   08/08/19 0349 08/08/19 0800 08/08/19 1140 08/08/19 1318  BP: (!) 143/95 (!) 144/98 (!) 144/102   Pulse: 80 82 61   Resp:  16 19 (!) 29  Temp: 98.3 F (36.8 C) 97.6 F (36.4 C) 98.3 F (36.8 C)   TempSrc: Oral Oral Oral   SpO2: 98% 94% 95%   Weight:      Height:        General: Pt is alert, awake, not in acute distress Cardiovascular: RRR, S1/S2 +, no rubs, no gallops Respiratory: CTA bilaterally, no wheezing, no rhonchi Abdominal: Soft, NT, ND, bowel sounds + Extremities: no edema, no cyanosis  Discharge Instructions  Discharge Instructions    Ambulatory referral to Neurology   Complete by: As directed    Follow up in stroke clinic at [facility] with Frann Rider, NP in about 4 weeks. If not available, consider Dr. Antony Contras, Dr. Bess Harvest, or Dr. Sarina Ill.     Allergies as of 08/08/2019      Reactions   Xarelto [rivaroxaban] Rash   Patient denies allergy as of 08/07/19   Penicillins Other (See Comments)   Very flushed  Has patient had a PCN reaction causing  immediate rash, facial/tongue/throat swelling, SOB or lightheadedness with hypotension: No Has patient had a PCN reaction causing severe rash involving mucus membranes or skin necrosis: No Has patient had a PCN reaction that required hospitalization No Has patient had a PCN reaction occurring within the last 10 years: No If all of the above answers are "NO", then may  proceed with Cephalosporin use.   Statins Other (See Comments)   Pt c/o muscle aches with use of statins.   Vancomycin Rash      Medication List    STOP taking these medications   aspirin EC 81 MG tablet     TAKE these medications   apixaban 5 MG Tabs tablet Commonly known as: ELIQUIS Take 1 tablet (5 mg total) by mouth 2 (two) times daily.   brimonidine 0.15 % ophthalmic solution Commonly known as: ALPHAGAN Place 1 drop into the right eye 2 (two) times daily.   CENTRUM MEN PO Take 1 tablet by mouth daily.   cetirizine 10 MG tablet Commonly known as: ZYRTEC Take 10 mg by mouth every evening.   cholecalciferol 25 MCG (1000 UNIT) tablet Commonly known as: VITAMIN D Take 1,000 Units by mouth daily.   ferrous sulfate 325 (65 FE) MG tablet Take 325 mg by mouth daily with breakfast.   finasteride 5 MG tablet Commonly known as: PROSCAR Take 5 mg by mouth daily.   fluticasone 50 MCG/ACT nasal spray Commonly known as: FLONASE Place 2 sprays into both nostrils 2 (two) times daily.   Glucosamine 750 MG Tabs Take 750 mg by mouth daily.   meloxicam 15 MG tablet Commonly known as: MOBIC Take 15 mg by mouth daily.   rosuvastatin 5 MG tablet Commonly known as: CRESTOR Take 1 tablet (5 mg total) by mouth daily at 6 PM.   sildenafil 20 MG tablet Commonly known as: REVATIO Take 40-100 mg by mouth daily as needed (erectile dysfunction.).      Follow-up Information    [facility] Follow up in 4 week(s).   Specialty: Neurology Why: stroke clinic. office will call with appt date and time.  Contact information: [address] [place] [phone]       Lawerance Cruel, MD. Schedule an appointment as soon as possible for a visit in 1 week(s).   Specialty: Family Medicine Contact information: [place] [address] [phone]        Lorretta Harp, MD Follow up.   Specialties:  Cardiology, Radiology Contact information: [address] [place] [address] [place] [address] [phone]          Allergies  Allergen Reactions  . Xarelto [Rivaroxaban] Rash    Patient denies allergy as of 08/07/19  . Penicillins Other (See Comments)    Very flushed  Has patient had a PCN reaction causing immediate rash, facial/tongue/throat swelling, SOB or lightheadedness with hypotension: No Has patient had a PCN reaction causing severe rash involving mucus membranes or skin necrosis: No Has patient had a PCN reaction that required hospitalization No Has patient had a PCN reaction occurring within the last 10 years: No If all of the above answers are "NO", then may proceed with Cephalosporin use.   . Statins Other (See Comments)    Pt c/o muscle aches with use of statins.  . Vancomycin Rash    You were cared for by a hospitalist during your hospital stay. If you have any questions about your discharge medications or the care you  received while you were in the hospital after you are discharged, you can call the unit and asked to speak with the hospitalist on call if the hospitalist that took care of you is not available. Once you are discharged, your primary care physician will handle any further medical issues. Please note that no refills for any discharge medications will be authorized once you are discharged, as it is imperative that you return to your primary care physician (or establish a relationship with a primary care physician if you do not have one) for your aftercare needs so that they can reassess your need for medications and monitor your lab values.   Procedures/Studies: CT Angio Head W or Wo Contrast  Result Date: 08/06/2019 CLINICAL DATA:  Code stroke. 74 year old male with aphasia and right arm weakness onset 1245 hours. History of atrial fibrillation not on anticoagulation. EXAM: CT ANGIOGRAPHY HEAD AND NECK TECHNIQUE: Multidetector CT imaging of the head and neck  was performed using the standard protocol during bolus administration of intravenous contrast. Multiplanar CT image reconstructions and MIPs were obtained to evaluate the vascular anatomy. Carotid stenosis measurements (when applicable) are obtained utilizing NASCET criteria, using the distal internal carotid diameter as the denominator. CONTRAST:  148mL OMNIPAQUE IOHEXOL 350 MG/ML SOLN COMPARISON:  Plain head CT 1316 hours today. FINDINGS: CTA NECK Skeleton: Upper cervical spine ankylosis and lower cervical spine degeneration. No acute osseous abnormality identified. Upper chest: Negative upper lungs. There is some enlargement of the central pulmonary arteries. A small volume of fluid is suspected in the superior pericardial recess. No superior mediastinal lymphadenopathy. Other neck: Negative, no neck mass or lymphadenopathy. Aortic arch: 3 vessel arch configuration with minimal arch atherosclerosis. Right carotid system: Mildly tortuous right CCA without plaque or stenosis. Mild plaque at the right ICA origin and bulb without stenosis. Just below the skull base there is a small 2-3 mm saccular outpouching of the cervical right ICA directed anteriorly (series 10, image 89). But no other regional vessel irregularity. Left carotid system: Tortuous proximal left CCA. Mild plaque at the left ICA origin and bulb without stenosis. Tortuous left ICA, dolichoectatic at the C1 level up to 62 mm. But no vessel irregularity. Vertebral arteries: Mild proximal right subclavian artery plaque without stenosis. Normal right vertebral artery origin. Dominant right vertebral artery is patent to the skull base without stenosis. Mild proximal left subclavian artery plaque without stenosis. Non dominant left vertebral artery origin is normal. Mildly tortuous left V1 segment. The left vertebral remains non dominant to the skull base. There is mild left V3 plaque without stenosis. CTA HEAD Posterior circulation: The left vertebral V4  segment is diminutive beyond the patent left PICA origin. The right V4 primarily supplies the basilar. Normal right PICA origin. Tortuous vertebrobasilar junction without stenosis. Patent basilar artery without stenosis. Normal SCA and PCA origins. Small right posterior communicating artery, the left is diminutive or absent. The left P1 is mildly tortuous. Bilateral PCA branches are within normal limits. Anterior circulation: Both ICA siphons are patent with minimal plaque and no stenosis. Normal ophthalmic artery origins. Patent carotid termini. Normal MCA and ACA origins. Anterior communicating artery and bilateral ACA branches are within normal limits. Right MCA M1 segment and bifurcation are patent without stenosis. Right MCA branches are within normal limits. Left MCA M1 segment and bifurcation are patent without stenosis. Left MCA branches are within normal limits. No branch occlusion identified. Venous sinuses: Patent. Anatomic variants: Dominant right vertebral artery. Review of the MIP images confirms the  above findings IMPRESSION: 1. Negative for large vessel occlusion, and mild for age atherosclerosis in the head and neck. 2. 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 Perhaps this reflects underlying Fibromuscular Dysplasia (FMD), although no other vessel irregularity is identified. Study discussed by telephone with Dr. Blanchie Dessert on 08/06/2019 at 13:41 . Electronically Signed   By: Genevie Ann M.D.   On: 08/06/2019 13:45   CT Head Wo Contrast  Result Date: 08/06/2019 CLINICAL DATA:  74 year old male code stroke presentation earlier today, symptoms now improving. History of atrial fibrillation not on anticoagulation. EXAM: CT HEAD WITHOUT CONTRAST TECHNIQUE: Contiguous axial images were obtained from the base of the skull through the vertex without intravenous contrast. COMPARISON:  CT head 1317 hours today.   CTA head and neck. FINDINGS: Brain: Some intravascular contrast remains present. No midline shift, ventriculomegaly, mass effect, evidence of mass lesion, intracranial hemorrhage or evidence of cortically based acute infarction. Gray-white matter differentiation is within normal limits throughout the brain. No abnormal enhancement is evident. Vascular: Mild Calcified atherosclerosis at the skull base. Skull: No acute osseous abnormality identified. Sinuses/Orbits: Visualized paranasal sinuses and mastoids are stable and well pneumatized. Other: No acute orbit or scalp soft tissue finding. IMPRESSION: Stable since 1316 hours and normal for age CT appearance of the brain. Electronically Signed   By: Genevie Ann M.D.   On: 08/06/2019 15:21   CT Angio Neck W and/or Wo Contrast  Result Date: 08/06/2019 CLINICAL DATA:  Code stroke. 74 year old male with aphasia and right arm weakness onset 1245 hours. History of atrial fibrillation not on anticoagulation. EXAM: CT ANGIOGRAPHY HEAD AND NECK TECHNIQUE: Multidetector CT imaging of the head and neck was performed using the standard protocol during bolus administration of intravenous contrast. Multiplanar CT image reconstructions and MIPs were obtained to evaluate the vascular anatomy. Carotid stenosis measurements (when applicable) are obtained utilizing NASCET criteria, using the distal internal carotid diameter as the denominator. CONTRAST:  193mL OMNIPAQUE IOHEXOL 350 MG/ML SOLN COMPARISON:  Plain head CT 1316 hours today. FINDINGS: CTA NECK Skeleton: Upper cervical spine ankylosis and lower cervical spine degeneration. No acute osseous abnormality identified. Upper chest: Negative upper lungs. There is some enlargement of the central pulmonary arteries. A small volume of fluid is suspected in the superior pericardial recess. No superior mediastinal lymphadenopathy. Other neck: Negative, no neck mass or lymphadenopathy. Aortic arch: 3 vessel arch configuration with minimal  arch atherosclerosis. Right carotid system: Mildly tortuous right CCA without plaque or stenosis. Mild plaque at the right ICA origin and bulb without stenosis. Just below the skull base there is a small 2-3 mm saccular outpouching of the cervical right ICA directed anteriorly (series 10, image 89). But no other regional vessel irregularity. Left carotid system: Tortuous proximal left CCA. Mild plaque at the left ICA origin and bulb without stenosis. Tortuous left ICA, dolichoectatic at the C1 level up to 62 mm. But no vessel irregularity. Vertebral arteries: Mild proximal right subclavian artery plaque without stenosis. Normal right vertebral artery origin. Dominant right vertebral artery is patent to the skull base without stenosis. Mild proximal left subclavian artery plaque without stenosis. Non dominant left vertebral artery origin is normal. Mildly tortuous left V1 segment. The left vertebral remains non dominant to the skull base. There is mild left V3 plaque without stenosis. CTA HEAD Posterior circulation: The left vertebral V4 segment is diminutive beyond the patent left PICA  origin. The right V4 primarily supplies the basilar. Normal right PICA origin. Tortuous vertebrobasilar junction without stenosis. Patent basilar artery without stenosis. Normal SCA and PCA origins. Small right posterior communicating artery, the left is diminutive or absent. The left P1 is mildly tortuous. Bilateral PCA branches are within normal limits. Anterior circulation: Both ICA siphons are patent with minimal plaque and no stenosis. Normal ophthalmic artery origins. Patent carotid termini. Normal MCA and ACA origins. Anterior communicating artery and bilateral ACA branches are within normal limits. Right MCA M1 segment and bifurcation are patent without stenosis. Right MCA branches are within normal limits. Left MCA M1 segment and bifurcation are patent without stenosis. Left MCA branches are within normal limits. No branch  occlusion identified. Venous sinuses: Patent. Anatomic variants: Dominant right vertebral artery. Review of the MIP images confirms the above findings IMPRESSION: 1. Negative for large vessel occlusion, and mild for age atherosclerosis in the head and neck. 2. 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 Perhaps this reflects underlying Fibromuscular Dysplasia (FMD), although no other vessel irregularity is identified. Study discussed by telephone with Dr. Blanchie Dessert on 08/06/2019 at 13:41 . Electronically Signed   By: Genevie Ann M.D.   On: 08/06/2019 13:45   MR BRAIN WO CONTRAST  Result Date: 08/06/2019 CLINICAL DATA:  Neuro deficit, acute, stroke suspected. Additional history provided: 74 year old male with history of paroxysmal atrial fibrillation presenting with acute onset right-sided weakness and speech disturbance. EXAM: MRI HEAD WITHOUT CONTRAST TECHNIQUE: Multiplanar, multiecho pulse sequences of the brain and surrounding structures were obtained without intravenous contrast. COMPARISON:  Noncontrast CT head and CT angiogram head/neck performed earlier the same day 08/06/2019. FINDINGS: Brain: Mild intermittent motion degradation. There is a 2 cm focus of restricted diffusion within the subcortical white matter of the mid left frontal lobe consistent with acute infarction (series 7, images 45 and 46) (series 13, image 55). No evidence of acute infarct elsewhere within the brain. No evidence of intracranial mass. No midline shift or extra-axial fluid collection. No chronic intracranial blood products. No significant white matter disease for age. Cerebral volume is normal for age. Vascular: Flow voids maintained within the proximal large arterial vessels. Skull and upper cervical spine: No focal marrow lesion. Sinuses/Orbits: Visualized orbits demonstrate no acute abnormality. Moderate ethmoid sinus mucosal  thickening. Mild mucosal thickening is also present within the inferior right maxillary sinus. No significant mastoid effusion. IMPRESSION: 1. Mildly motion degraded examination. 2. 2 cm acute infarct within the subcortical white matter of the mid left frontal lobe. 3. Otherwise unremarkable MRI appearance of the brain for age. 4. Paranasal sinus disease as described. Electronically Signed   By: Kellie Simmering DO   On: 08/06/2019 17:22   DG Swallowing Func-Speech Pathology  Result Date: 08/07/2019 Objective Swallowing Evaluation: Type of Study: MBS-Modified Barium Swallow Study  Patient Details Name: Joshua Moran. MRN: [id number] Date of Birth: 1945/09/03 Today's Date: 08/07/2019 Time: SLP Start Time (ACUTE ONLY): 1345 -SLP Stop Time (ACUTE ONLY): 1404 SLP Time Calculation (min) (ACUTE ONLY): 19 min Past Medical History: Past Medical History: Diagnosis Date . Abnormal screening CT of chest   coronary calcium in left main and RCA . Anemia  . Arthritis  . Complication of anesthesia   Pt reports difficult urinating . Dyspnea   with some activity . Dysrhythmia   a- fib  . GERD (gastroesophageal reflux disease)   occasional .  Glaucoma  . Hyperlipidemia   statin intolerant Past Surgical History: Past Surgical History: Procedure Laterality Date . EYE SURGERY    bilateral cataracts . KNEE ARTHROSCOPY W/ DEBRIDEMENT    left  . RETINAL DETACHMENT SURGERY Bilateral  . TOTAL KNEE ARTHROPLASTY Left 01/01/2018  Procedure: LEFT TOTAL KNEE ARTHROPLASTY;  Surgeon: Gaynelle Arabian, MD;  Location: [facility];  Service: Orthopedics;  Laterality: Left;  50 mins . TOTAL KNEE ARTHROPLASTY Right 06/25/2018  Procedure: TOTAL KNEE ARTHROPLASTY;  Surgeon: Gaynelle Arabian, MD;  Location: [facility];  Service: Orthopedics;  Laterality: Right;  75min . VASECTOMY   HPI: Pt is a 74 y.o. male with past medical history of paroxysmal atrial fibrillation, hyperlipidemia, anemia who presented to the ED with acute onset of right-sided weakness, aphasia, and slurred  speech. MRI of the brain revealed 2 cm acute infarct within the subcortical white matter of the mid left frontal lobe. Pt's wife reported that if he eats too fast he gets the hiccups and pt stated that he coughs if he takes too large of a gulp.  Subjective: cooperative Assessment / Plan / Recommendation CHL IP CLINICAL IMPRESSIONS 08/07/2019 Clinical Impression Patient presents with mild oropharyngeal dysphagia across consistencies. Oral phase is remarkable for reduced lingual control resulting in decreased bolus cohesion and premature spillage to the vallecula and pyriform sinsues, and lingual residue. Premature spillage in the pyriform sinuses ultimately resulted in trace silent aspiration during the swallow, as the patient took large consecutive sips. As the patient took cup sips, he was noted with trace penetration (PAS 2-3), but no aspiration. He also intermittently cleared his throat, which helped protect his airway. Chin tuck was attempted, but ultimately did not prevent aspiration. Pt also had decreased BOT retraction resulting in vallecula and pyriform sinus residue that was reduced as the pt took multiple dry swallows, or used a liquid wash. Recommend thin liquids and regular solid diet, with compensatory strategies (no straws, multiple dry swallows, clear throat intermittently). SLP Visit Diagnosis Dysphagia, oropharyngeal phase (R13.12) Attention and concentration deficit following -- Frontal lobe and executive function deficit following -- Impact on safety and function Moderate aspiration risk;Mild aspiration risk   CHL IP TREATMENT RECOMMENDATION 08/07/2019 Treatment Recommendations Therapy as outlined in treatment plan below   Prognosis 08/07/2019 Prognosis for Safe Diet Advancement Good Barriers to Reach Goals -- Barriers/Prognosis Comment -- CHL IP DIET RECOMMENDATION 08/07/2019 SLP Diet Recommendations Regular solids;Thin liquid Liquid Administration via No straw;Cup Medication Administration Whole meds  with puree Compensations Minimize environmental distractions;Slow rate;Small sips/bites;Multiple dry swallows after each bite/sip;Clear throat intermittently Postural Changes Remain semi-upright after after feeds/meals (Comment);Seated upright at 90 degrees   CHL IP OTHER RECOMMENDATIONS 08/07/2019 Recommended Consults -- Oral Care Recommendations Oral care BID Other Recommendations --   CHL IP FOLLOW UP RECOMMENDATIONS 08/07/2019 Follow up Recommendations Other (comment)   CHL IP FREQUENCY AND DURATION 08/07/2019 Speech Therapy Frequency (ACUTE ONLY) min 2x/week Treatment Duration 2 weeks      CHL IP ORAL PHASE 08/07/2019 Oral Phase Impaired Oral - Pudding Teaspoon -- Oral - Pudding Cup -- Oral - Honey Teaspoon -- Oral - Honey Cup -- Oral - Nectar Teaspoon -- Oral - Nectar Cup -- Oral - Nectar Straw -- Oral - Thin Teaspoon NT Oral - Thin Cup Lingual/palatal residue Oral - Thin Straw Decreased bolus cohesion;Premature spillage;Lingual/palatal residue Oral - Puree WFL Oral - Mech Soft -- Oral - Regular WFL Oral - Multi-Consistency -- Oral - Pill -- Oral Phase - Comment --  CHL IP PHARYNGEAL PHASE  08/07/2019 Pharyngeal Phase Impaired Pharyngeal- Pudding Teaspoon -- Pharyngeal -- Pharyngeal- Pudding Cup -- Pharyngeal -- Pharyngeal- Honey Teaspoon -- Pharyngeal -- Pharyngeal- Honey Cup -- Pharyngeal -- Pharyngeal- Nectar Teaspoon -- Pharyngeal -- Pharyngeal- Nectar Cup -- Pharyngeal -- Pharyngeal- Nectar Straw -- Pharyngeal -- Pharyngeal- Thin Teaspoon -- Pharyngeal -- Pharyngeal- Thin Cup Pharyngeal residue - valleculae;Pharyngeal residue - pyriform;Penetration/Aspiration during swallow;Compensatory strategies attempted (with notebox) Pharyngeal Material enters airway, passes BELOW cords without attempt by patient to eject out (silent aspiration) Pharyngeal- Thin Straw Pharyngeal residue - pyriform;Pharyngeal residue - valleculae;Penetration/Aspiration during swallow;Trace aspiration Pharyngeal Material enters airway, passes  BELOW cords without attempt by patient to eject out (silent aspiration) Pharyngeal- Puree Pharyngeal residue - valleculae;Pharyngeal residue - pyriform Pharyngeal -- Pharyngeal- Mechanical Soft -- Pharyngeal -- Pharyngeal- Regular Pharyngeal residue - pyriform;Pharyngeal residue - valleculae Pharyngeal -- Pharyngeal- Multi-consistency -- Pharyngeal -- Pharyngeal- Pill -- Pharyngeal -- Pharyngeal Comment --  No flowsheet data found. Osie Bond., M.A. [facility] Pager [phone] Office [phone] 08/07/2019, 3:15 PM              ECHOCARDIOGRAM COMPLETE  Result Date: 08/07/2019    ECHOCARDIOGRAM REPORT   Patient Name:   Joshua Moran. Date of Exam: 08/07/2019 Medical Rec #:  [id number]         Height:       72.0 in Accession #:    [id number]        Weight:       283.5 lb Date of Birth:  12/07/1945          BSA:          2.471 m Patient Age:    74 years          BP:           128/93 mmHg Patient Gender: M                 HR:           67 bpm. Exam Location:  Inpatient Procedure: 2D Echo Indications:    stroke 434.91  History:        Patient has prior history of Echocardiogram examinations, most                 recent 09/27/2017. Risk Factors:Dyslipidemia. Dysrhythmia.  Sonographer:    Jannett Celestine RDCS (AE) Referring Phys: [phone] [facility]  1. Left ventricular ejection fraction, by estimation, is 45 to 50%. The left ventricle has mildly decreased function. The left ventricle demonstrates global hypokinesis. There is mild left ventricular hypertrophy. Left ventricular diastolic parameters are indeterminate.  2. Right ventricular systolic function is mildly reduced. The right ventricular size is normal. Tricuspid regurgitation signal is inadequate for assessing PA pressure.  3. Left atrial size was mildly dilated.  4. The mitral valve is normal in structure and function. Trivial mitral valve regurgitation. No evidence of mitral stenosis.  5. The aortic valve is tricuspid.  Aortic valve regurgitation is not visualized. Mild aortic valve sclerosis is present, with no evidence of aortic valve stenosis.  6. The inferior vena cava is normal in size with greater than 50% respiratory variability, suggesting right atrial pressure of 3 mmHg.  7. Technically difficult study with poor acoustic windows. FINDINGS  Left Ventricle: Left ventricular ejection fraction, by estimation, is 45 to 50%. The left ventricle has mildly decreased function. The left ventricle demonstrates global hypokinesis. The left ventricular internal cavity size was normal in size. There is  mild left ventricular  hypertrophy. Left ventricular diastolic parameters are indeterminate. Right Ventricle: The right ventricular size is normal. No increase in right ventricular wall thickness. Right ventricular systolic function is mildly reduced. Tricuspid regurgitation signal is inadequate for assessing PA pressure. Left Atrium: Left atrial size was mildly dilated. Right Atrium: Right atrial size was normal in size. Pericardium: There is no evidence of pericardial effusion. Mitral Valve: The mitral valve is normal in structure and function. There is mild calcification of the mitral valve leaflet(s). Mild to moderate mitral annular calcification. Trivial mitral valve regurgitation. No evidence of mitral valve stenosis. Tricuspid Valve: The tricuspid valve is normal in structure. Tricuspid valve regurgitation is not demonstrated. Aortic Valve: The aortic valve is tricuspid. Aortic valve regurgitation is not visualized. Mild aortic valve sclerosis is present, with no evidence of aortic valve stenosis. Pulmonic Valve: The pulmonic valve was not well visualized. Pulmonic valve regurgitation is not visualized. Aorta: The aortic root is normal in size and structure. Venous: The inferior vena cava is normal in size with greater than 50% respiratory variability, suggesting right atrial pressure of 3 mmHg. IAS/Shunts: No atrial level shunt  detected by color flow Doppler.  LEFT VENTRICLE PLAX 2D LVIDd:         4.30 cm LVIDs:         3.40 cm LV PW:         1.60 cm LV IVS:        1.40 cm LVOT diam:     2.30 cm LV SV:         45 LV SV Index:   18 LVOT Area:     4.15 cm  LEFT ATRIUM           Index       RIGHT ATRIUM           Index LA diam:      4.70 cm 1.90 cm/m  RA Area:     20.40 cm LA Vol (A4C): 74.3 ml 30.07 ml/m RA Volume:   58.20 ml  23.55 ml/m  AORTIC VALVE LVOT Vmax:   46.30 cm/s LVOT Vmean:  38.500 cm/s LVOT VTI:    0.109 m  AORTA Ao Root diam: 3.30 cm MITRAL VALVE MV Area (PHT): 2.91 cm    SHUNTS MV Decel Time: 261 msec    Systemic VTI:  0.11 m MV E velocity: 56.10 cm/s  Systemic Diam: 2.30 cm Loralie Champagne MD Electronically signed by Loralie Champagne MD Signature Date/Time: 08/07/2019/5:32:01 PM    Final    CT HEAD CODE STROKE WO CONTRAST  Result Date: 08/06/2019 CLINICAL DATA:  Code stroke. 74 year old male with aphasia and right arm weakness onset 1245 hours. History of atrial fibrillation. EXAM: CT HEAD WITHOUT CONTRAST TECHNIQUE: Contiguous axial images were obtained from the base of the skull through the vertex without intravenous contrast. COMPARISON:  None. FINDINGS: Brain: Cerebral volume is within normal limits for age. No midline shift, ventriculomegaly, mass effect, evidence of mass lesion, intracranial hemorrhage or evidence of cortically based acute infarction. Gray-white matter differentiation is within normal limits throughout the brain. Vascular: Mild Calcified atherosclerosis at the skull base. No suspicious intracranial vascular hyperdensity. Skull: No acute osseous abnormality identified. Sinuses/Orbits: Visualized paranasal sinuses and mastoids are well pneumatized. Other: Postoperative changes to the globes. No acute orbit or scalp soft tissue finding. ASPECTS [facility] Stroke Program Early CT Score) Total score (0-10 with 10 being normal): 10 IMPRESSION: Normal for age non contrast CT appearance of the brain.  ASPECTS  10. Study  discussed by telephone with Dr. Blanchie Dessert on 08/06/2019 at 13:276 . Electronically Signed   By: Genevie Ann M.D.   On: 08/06/2019 13:28     The results of significant diagnostics from this hospitalization (including imaging, microbiology, ancillary and laboratory) are listed below for reference.     Microbiology: Recent Results (from the past 240 hour(s))  Respiratory Panel by RT PCR (Flu A&B, Covid) - Nasopharyngeal Swab     Status: None   Collection Time: 08/06/19  1:32 PM   Specimen: Nasopharyngeal Swab  Result Value Ref Range Status   SARS Coronavirus 2 by RT PCR NEGATIVE NEGATIVE Final    Comment: (NOTE) SARS-CoV-2 target nucleic acids are NOT DETECTED. The SARS-CoV-2 RNA is generally detectable in upper respiratoy specimens during the acute phase of infection. The lowest concentration of SARS-CoV-2 viral copies this assay can detect is 131 copies/mL. A negative result does not preclude SARS-Cov-2 infection and should not be used as the sole basis for treatment or other patient management decisions. A negative result may occur with  improper specimen collection/handling, submission of specimen other than nasopharyngeal swab, presence of viral mutation(s) within the areas targeted by this assay, and inadequate number of viral copies (<131 copies/mL). A negative result must be combined with clinical observations, patient history, and epidemiological information. The expected result is Negative. Fact Sheet for Patients:  PinkCheek.be Fact Sheet for Healthcare Providers:  GravelBags.it This test is not yet ap proved or cleared by the [place] FDA and  has been authorized for detection and/or diagnosis of SARS-CoV-2 by FDA under an Emergency Use Authorization (EUA). This EUA will remain  in effect (meaning this test can be used) for the duration of the COVID-19 declaration under Section 564(b)(1) of the Act,  21 U.S.C. section 360bbb-3(b)(1), unless the authorization is terminated or revoked sooner.    Influenza A by PCR NEGATIVE NEGATIVE Final   Influenza B by PCR NEGATIVE NEGATIVE Final    Comment: (NOTE) The Xpert Xpress SARS-CoV-2/FLU/RSV assay is intended as an aid in  the diagnosis of influenza from Nasopharyngeal swab specimens and  should not be used as a sole basis for treatment. Nasal washings and  aspirates are unacceptable for Xpert Xpress SARS-CoV-2/FLU/RSV  testing. Fact Sheet for Patients: PinkCheek.be Fact Sheet for Healthcare Providers: GravelBags.it This test is not yet approved or cleared by the [place] FDA and  has been authorized for detection and/or diagnosis of SARS-CoV-2 by  FDA under an Emergency Use Authorization (EUA). This EUA will remain  in effect (meaning this test can be used) for the duration of the  Covid-19 declaration under Section 564(b)(1) of the Act, 21  U.S.C. section 360bbb-3(b)(1), unless the authorization is  terminated or revoked. Performed at [facility], [place] [address], [place], [place] [address]      Labs: BNP (last 3 results) No results for input(s): BNP in the last 8760 hours. Basic Metabolic Panel: Recent Labs  Lab 08/06/19 1315 08/06/19 1330 08/07/19 0614  NA 142 141 141  K 3.6 3.6 3.6  CL 107 104 105  CO2 27  --  27  GLUCOSE 89 82 95  BUN 25* 25* 14  CREATININE 0.96 0.90 0.79  CALCIUM 8.5*  --  8.6*  MG  --   --  2.0  PHOS  --   --  4.0   Liver Function Tests: Recent Labs  Lab 08/06/19 1315  AST 21  ALT 26  ALKPHOS 68  BILITOT 1.0  PROT 6.8  ALBUMIN 3.7   No results for input(s): LIPASE, AMYLASE in the last 168 hours. No results for input(s): AMMONIA in the last 168 hours. CBC: Recent Labs  Lab 08/06/19 1315 08/06/19 1330 08/07/19 0614  WBC 7.0  --  7.1  NEUTROABS 3.6  --   --   HGB 14.9 15.6 14.8  HCT 46.9 46.0 44.6   MCV 95.7  --  92.7  PLT 241  --  235   Cardiac Enzymes: No results for input(s): CKTOTAL, CKMB, CKMBINDEX, TROPONINI in the last 168 hours. BNP: Invalid input(s): POCBNP CBG: No results for input(s): GLUCAP in the last 168 hours. D-Dimer No results for input(s): DDIMER in the last 72 hours. Hgb A1c Recent Labs    08/07/19 0614  HGBA1C 5.8*   Lipid Profile Recent Labs    08/07/19 0614  CHOL 175  HDL 47  LDLCALC 106*  TRIG 109  CHOLHDL 3.7   Thyroid function studies No results for input(s): TSH, T4TOTAL, T3FREE, THYROIDAB in the last 72 hours.  Invalid input(s): FREET3 Anemia work up No results for input(s): VITAMINB12, FOLATE, FERRITIN, TIBC, IRON, RETICCTPCT in the last 72 hours. Urinalysis    Component Value Date/Time   COLORURINE YELLOW 08/06/2019 1315   APPEARANCEUR CLEAR 08/06/2019 1315   LABSPEC 1.023 08/06/2019 1315   PHURINE 5.0 08/06/2019 1315   GLUCOSEU NEGATIVE 08/06/2019 1315   HGBUR NEGATIVE 08/06/2019 1315   BILIRUBINUR NEGATIVE 08/06/2019 1315   KETONESUR NEGATIVE 08/06/2019 1315   PROTEINUR NEGATIVE 08/06/2019 1315   UROBILINOGEN 0.2 03/05/2015 1921   NITRITE NEGATIVE 08/06/2019 1315   LEUKOCYTESUR NEGATIVE 08/06/2019 1315   Sepsis Labs Invalid input(s): PROCALCITONIN,  WBC,  LACTICIDVEN Microbiology Recent Results (from the past 240 hour(s))  Respiratory Panel by RT PCR (Flu A&B, Covid) - Nasopharyngeal Swab     Status: None   Collection Time: 08/06/19  1:32 PM   Specimen: Nasopharyngeal Swab  Result Value Ref Range Status   SARS Coronavirus 2 by RT PCR NEGATIVE NEGATIVE Final    Comment: (NOTE) SARS-CoV-2 target nucleic acids are NOT DETECTED. The SARS-CoV-2 RNA is generally detectable in upper respiratoy specimens during the acute phase of infection. The lowest concentration of SARS-CoV-2 viral copies this assay can detect is 131 copies/mL. A negative result does not preclude SARS-Cov-2 infection and should not be used as the sole  basis for treatment or other patient management decisions. A negative result may occur with  improper specimen collection/handling, submission of specimen other than nasopharyngeal swab, presence of viral mutation(s) within the areas targeted by this assay, and inadequate number of viral copies (<131 copies/mL). A negative result must be combined with clinical observations, patient history, and epidemiological information. The expected result is Negative. Fact Sheet for Patients:  PinkCheek.be Fact Sheet for Healthcare Providers:  GravelBags.it This test is not yet ap proved or cleared by the [place] FDA and  has been authorized for detection and/or diagnosis of SARS-CoV-2 by FDA under an Emergency Use Authorization (EUA). This EUA will remain  in effect (meaning this test can be used) for the duration of the COVID-19 declaration under Section 564(b)(1) of the Act, 21 U.S.C. section 360bbb-3(b)(1), unless the authorization is terminated or revoked sooner.    Influenza A by PCR NEGATIVE NEGATIVE Final   Influenza B by PCR NEGATIVE NEGATIVE Final    Comment: (NOTE) The Xpert Xpress SARS-CoV-2/FLU/RSV assay is intended as an aid in  the diagnosis of influenza from Nasopharyngeal swab specimens and  should  not be used as a sole basis for treatment. Nasal washings and  aspirates are unacceptable for Xpert Xpress SARS-CoV-2/FLU/RSV  testing. Fact Sheet for Patients: PinkCheek.be Fact Sheet for Healthcare Providers: GravelBags.it This test is not yet approved or cleared by the [place] FDA and  has been authorized for detection and/or diagnosis of SARS-CoV-2 by  FDA under an Emergency Use Authorization (EUA). This EUA will remain  in effect (meaning this test can be used) for the duration of the  Covid-19 declaration under Section 564(b)(1) of the Act, 21  U.S.C.  section 360bbb-3(b)(1), unless the authorization is  terminated or revoked. Performed at [facility], [place] [address] [place], [place] [address]      Time coordinating discharge:  I have spent 35 minutes face to face with the patient and on the ward discussing the patients care, assessment, plan and disposition with other care givers. >50% of the time was devoted counseling the patient about the risks and benefits of treatment/Discharge disposition and coordinating care.   SIGNED:   Damita Lack, MD  [facility] 08/08/2019, 1:26 PM   If 7PM-7AM, please contact night-coverage

## 2019-08-08 NOTE — Progress Notes (Signed)
Discharge teaching complete. Meds, diet, activity, follow up appointments reviewed and all questions answered. Copy of instructions given to patient and Eliquis at bedside.

## 2019-08-08 NOTE — Progress Notes (Signed)
Spoke with Dr. Reesa Chew about patient's heart rate. He is still ok with the patient being discharged because his heart rate has come back down. Patient will follow up with cardiology outpatient.

## 2019-08-08 NOTE — Telephone Encounter (Signed)
That is fine with me  JJB

## 2019-08-12 DIAGNOSIS — Z09 Encounter for follow-up examination after completed treatment for conditions other than malignant neoplasm: Secondary | ICD-10-CM | POA: Diagnosis not present

## 2019-08-12 DIAGNOSIS — I4891 Unspecified atrial fibrillation: Secondary | ICD-10-CM | POA: Diagnosis not present

## 2019-08-12 DIAGNOSIS — D6869 Other thrombophilia: Secondary | ICD-10-CM | POA: Diagnosis not present

## 2019-08-12 DIAGNOSIS — Z79899 Other long term (current) drug therapy: Secondary | ICD-10-CM | POA: Diagnosis not present

## 2019-08-12 DIAGNOSIS — I639 Cerebral infarction, unspecified: Secondary | ICD-10-CM | POA: Diagnosis not present

## 2019-08-12 DIAGNOSIS — G459 Transient cerebral ischemic attack, unspecified: Secondary | ICD-10-CM | POA: Diagnosis not present

## 2019-08-13 ENCOUNTER — Ambulatory Visit: Payer: PPO | Attending: Internal Medicine

## 2019-08-13 DIAGNOSIS — Z23 Encounter for immunization: Secondary | ICD-10-CM

## 2019-08-13 NOTE — Progress Notes (Signed)
   [facility]  Name:  Joshua Moran.    MRN: [id number] DOB: 09-25-1945  08/13/2019  [name] was observed post Covid-19 immunization for 15 minutes without incident. He was provided with Vaccine Information Sheet and instruction to access the V-Safe system.   [name] was instructed to call 911 with any severe reactions post vaccine: Marland Kitchen Difficulty breathing  . Swelling of face and throat  . A fast heartbeat  . A bad rash all over body  . Dizziness and weakness   Immunizations Administered    Name Date Dose VIS Date Route   Pfizer COVID-19 Vaccine 08/13/2019  5:09 PM 0.3 mL 05/17/2019 Intramuscular   Manufacturer: [place]   Lot: [id number]   [place]: [id number]

## 2019-08-13 NOTE — Telephone Encounter (Signed)
Patient's daughter is following up on provider switch. Family is very anxious to get him in ASAP.

## 2019-08-13 NOTE — Telephone Encounter (Signed)
I am fine with the switch.

## 2019-08-14 ENCOUNTER — Telehealth: Payer: Self-pay | Admitting: Cardiology

## 2019-08-14 NOTE — Telephone Encounter (Signed)
Schedulers have already made the patient an appointment for 03/24.

## 2019-08-14 NOTE — Telephone Encounter (Signed)
° °  Pt said he need to see Dr. Radford Pax as soon as possible since he had a stroke recently. Pt declined earliest appt in April.  Please advise

## 2019-08-15 DIAGNOSIS — Z471 Aftercare following joint replacement surgery: Secondary | ICD-10-CM | POA: Diagnosis not present

## 2019-08-15 DIAGNOSIS — Z96651 Presence of right artificial knee joint: Secondary | ICD-10-CM | POA: Diagnosis not present

## 2019-08-28 ENCOUNTER — Telehealth: Payer: Self-pay | Admitting: Cardiology

## 2019-08-28 ENCOUNTER — Encounter: Payer: Self-pay | Admitting: Cardiology

## 2019-08-28 ENCOUNTER — Telehealth: Payer: Self-pay | Admitting: *Deleted

## 2019-08-28 ENCOUNTER — Ambulatory Visit: Payer: PPO | Admitting: Cardiology

## 2019-08-28 ENCOUNTER — Other Ambulatory Visit: Payer: Self-pay

## 2019-08-28 VITALS — BP 122/80 | HR 81 | Ht 72.0 in | Wt 286.0 lb

## 2019-08-28 DIAGNOSIS — E785 Hyperlipidemia, unspecified: Secondary | ICD-10-CM

## 2019-08-28 DIAGNOSIS — I2510 Atherosclerotic heart disease of native coronary artery without angina pectoris: Secondary | ICD-10-CM

## 2019-08-28 DIAGNOSIS — I639 Cerebral infarction, unspecified: Secondary | ICD-10-CM | POA: Diagnosis not present

## 2019-08-28 DIAGNOSIS — R0600 Dyspnea, unspecified: Secondary | ICD-10-CM

## 2019-08-28 DIAGNOSIS — I480 Paroxysmal atrial fibrillation: Secondary | ICD-10-CM | POA: Diagnosis not present

## 2019-08-28 NOTE — Patient Instructions (Signed)
Medication Instructions:  Your physician recommends that you continue on your current medications as directed. Please refer to the Current Medication list given to you today.  *If you need a refill on your cardiac medications before your next appointment, please call your pharmacy*   Testing/Procedures: Your physician has requested that you have a lexiscan myoview. For further information please visit [email or web address]. Please follow instruction sheet, as given.  Your physician has recommended that you have a sleep study. This test records several body functions during sleep, including: brain activity, eye movement, oxygen and carbon dioxide blood levels, heart rate and rhythm, breathing rate and rhythm, the flow of air through your mouth and nose, snoring, body muscle movements, and chest and belly movement.  Follow-Up: At [facility], you and your health needs are our priority.  As part of our continuing mission to provide you with exceptional heart care, we have created designated Provider Care Teams.  These Care Teams include your primary Cardiologist (physician) and Advanced Practice Providers (APPs -  Physician Assistants and Nurse Practitioners) who all work together to provide you with the care you need, when you need it.  Your next appointment:   3 month(s)  The format for your next appointment:   In Person  Provider:   [name], MD   Other Instructions You have been referred to the lipid clinic for further management of hyperlipidemia.

## 2019-08-28 NOTE — Telephone Encounter (Signed)
Spoke with patient's wife, it will be okay for her to accompany her husband.

## 2019-08-28 NOTE — Telephone Encounter (Signed)
Patient's wife, Manus Gunning, states she is requesting to accompany the patient during his appointment scheduled for today, 08/28/19 at 11:40 AM with Dr. Radford Pax due to the patient having a stroke 3 weeks ago and requiring assistance. Please advise.

## 2019-08-28 NOTE — Telephone Encounter (Signed)
-----   Message from Antonieta Iba, RN sent at 08/28/2019 12:32 PM EDT ----- Split night sleep study has been ordered for this patient.  Thanks!

## 2019-08-28 NOTE — Progress Notes (Addendum)
Cardiology Office Note:    Date:  08/28/2019   ID:  Joshua Dunker., DOB Dec 10, 1945, MRN [id number]  PCP:  Lawerance Cruel, MD  Cardiologist:  [name], MD    Referring MD: Lawerance Cruel, MD   Chief Complaint  Patient presents with  . Follow-up    Coronary calcifications, HLD, atrial fibrillation    History of Present Illness:    Joshua Vogler. is a 74 y.o. male with a hx of coronary calcifications of the LM and RCA, PAF, HLD, GERD and retired Teacher, English as a foreign language for [employer].  He has never smoked but has been followed by Dr. Gwenlyn Found in the past for [place] felt related to poor exercise tolerance from inactivity.  Stress myoview in 2016 was normal and 2D echo in 2019 showed normal LVF with G1DD and mild pulmonary HTN.  He saw Kerin Ransom in 07/2018 and was noted to be in afib by EKG but not started on DOAC as CHADS2VASC score he felt was 1.  Then saw Dr. Gwenlyn Found in 12/2018 for preop clearance for knee surgery and was still in afib and Dr. Gwenlyn Found stated that  His CHADS2VASC score was 1 but will increase to 2 in May 2021 when he turned 74yo. Unfortunately he was hospitalized a few weeks ago with an embolic CVA and is now on apixaban 5mg  BID.  He has recovered fully from his CVA.   He had a Chest CT done in 2016 showing 3v ASCAD. He has a hx of HLD and has been on Crestor 5mg  3 times weekly but has been intolerant to any higher dose.  His last LDL was 106 earlier this month.    He is here today for followup and is doing well.  He denies any chest pain or pressure, SOB, DOE, PND, orthopnea, LE edema, dizziness, palpitations or syncope. He is compliant with his meds and is tolerating meds with no SE.    Past Medical History:  Diagnosis Date  . Abnormal screening CT of chest    coronary calcium in left main and RCA  . Anemia   . Arthritis   . Complication of anesthesia    Pt reports difficult urinating  . Dyspnea    with some activity  . GERD (gastroesophageal reflux disease)    occasional   . Glaucoma   . Hyperlipidemia    statin intolerant at doses > Crestor 5mg  3 times weekly  . PAF (paroxysmal atrial fibrillation) (HCC)    CHADS2VASC score 2 on apixaban    Past Surgical History:  Procedure Laterality Date  . EYE SURGERY     bilateral cataracts  . KNEE ARTHROSCOPY W/ DEBRIDEMENT     left   . RETINAL DETACHMENT SURGERY Bilateral   . TOTAL KNEE ARTHROPLASTY Left 01/01/2018   Procedure: LEFT TOTAL KNEE ARTHROPLASTY;  Surgeon: Gaynelle Arabian, MD;  Location: [facility];  Service: Orthopedics;  Laterality: Left;  50 mins  . TOTAL KNEE ARTHROPLASTY Right 06/25/2018   Procedure: TOTAL KNEE ARTHROPLASTY;  Surgeon: Gaynelle Arabian, MD;  Location: [facility];  Service: Orthopedics;  Laterality: Right;  64min  . VASECTOMY      Current Medications: Current Meds  Medication Sig  . apixaban (ELIQUIS) 5 MG TABS tablet Take 1 tablet (5 mg total) by mouth 2 (two) times daily.  . brimonidine (ALPHAGAN) 0.15 % ophthalmic solution Place 1 drop into the right eye 2 (two) times daily.  . cetirizine (ZYRTEC) 10 MG tablet Take 10 mg by  mouth every evening.  . cholecalciferol (VITAMIN D) 25 MCG (1000 UNIT) tablet Take 1,000 Units by mouth daily.  . ferrous sulfate 325 (65 FE) MG tablet Take 325 mg by mouth daily with breakfast.  . finasteride (PROSCAR) 5 MG tablet Take 5 mg by mouth daily.  . fluticasone (FLONASE) 50 MCG/ACT nasal spray Place 2 sprays into both nostrils 2 (two) times daily.   . Glucosamine 750 MG TABS Take 750 mg by mouth daily.  . meloxicam (MOBIC) 15 MG tablet Take 15 mg by mouth daily.  . metoprolol succinate (TOPROL-XL) 25 MG 24 hr tablet Take 1 tablet by mouth as needed.  . Multiple Vitamins-Minerals (CENTRUM MEN PO) Take 1 tablet by mouth daily.  . rosuvastatin (CRESTOR) 5 MG tablet Take 5 mg by mouth 3 (three) times a week. M-W-F  . sildenafil (REVATIO) 20 MG tablet Take 40-100 mg by mouth daily as needed (erectile dysfunction.).      Allergies:   Xarelto [rivaroxaban],  Penicillins, Statins, and Vancomycin   Social History   Socioeconomic History  . Marital status: Married    Spouse name: Not on file  . Number of children: Not on file  . Years of education: Not on file  . Highest education level: Not on file  Occupational History  . Not on file  Tobacco Use  . Smoking status: Never Smoker  . Smokeless tobacco: Never Used  Substance and Sexual Activity  . Alcohol use: Yes    Alcohol/week: 1.0 standard drinks    Types: 1 Cans of beer per week    Comment: Rare  . Drug use: No  . Sexual activity: Not Currently  Other Topics Concern  . Not on file  Social History Narrative  . Not on file   Social Determinants of Health   Financial Resource Strain:   . Difficulty of Paying Living Expenses:   Food Insecurity:   . Worried About Charity fundraiser in the Last Year:   . Arboriculturist in the Last Year:   Transportation Needs:   . Film/video editor (Medical):   Marland Kitchen Lack of Transportation (Non-Medical):   Physical Activity:   . Days of Exercise per Week:   . Minutes of Exercise per Session:   Stress:   . Feeling of Stress :   Social Connections:   . Frequency of Communication with Friends and Family:   . Frequency of Social Gatherings with Friends and Family:   . Attends Religious Services:   . Active Member of Clubs or Organizations:   . Attends Archivist Meetings:   Marland Kitchen Marital Status:      Family History: The patient's family history includes Lung cancer in his mother.  ROS:   Please see the history of present illness.    ROS  All other systems reviewed and negative.   EKGs/Labs/Other Studies Reviewed:    The following studies were reviewed today: Outside labs from PCP  EKG:  EKG ist ordered today and showed atrial fibrillation with PVC and septal infarct  Recent Labs: 08/06/2019: ALT 26 08/07/2019: BUN 14; Creatinine, Ser 0.79; Hemoglobin 14.8; Magnesium 2.0; Platelets 235; Potassium 3.6; Sodium 141   Recent  Lipid Panel    Component Value Date/Time   CHOL 175 08/07/2019 0614   CHOL 177 03/14/2018 0811   TRIG 109 08/07/2019 0614   HDL 47 08/07/2019 0614   HDL 48 03/14/2018 0811   CHOLHDL 3.7 08/07/2019 0614   VLDL 22 08/07/2019 [id number]  LDLCALC 106 (H) 08/07/2019 [id number]   LDLCALC 114 (H) 03/14/2018 [id number]    Physical Exam:    VS:  BP 122/80   Pulse 81   Ht 6' (1.829 m)   Wt 286 lb (129.7 kg)   SpO2 97%   BMI 38.79 kg/m     Wt Readings from Last 3 Encounters:  08/28/19 286 lb (129.7 kg)  08/06/19 283 lb 8.2 oz (128.6 kg)  12/21/18 287 lb 3.2 oz (130.3 kg)     GEN:  Well nourished, well developed in no acute distress HEENT: Normal NECK: No JVD; No carotid bruits LYMPHATICS: No lymphadenopathy CARDIAC: RRR, no murmurs, rubs, gallops RESPIRATORY:  Clear to auscultation without rales, wheezing or rhonchi  ABDOMEN: Soft, non-tender, non-distended MUSCULOSKELETAL:  No edema; No deformity  SKIN: Warm and dry NEUROLOGIC:  Alert and oriented x 3 PSYCHIATRIC:  Normal affect   ASSESSMENT:    1. PAF (paroxysmal atrial fibrillation) ([place])   2. Dyslipidemia, goal LDL below 100   3. Coronary artery calcification seen on CAT scan   4. Acute ischemic stroke ([place])    PLAN:    In order of problems listed above:  1.  Persistent atrial fibrillation -remains in atrial fibrillation but rate controlled on the 80's -denies any bleeding problems on apixaban and no palpitations -continue Toprol XL 25mg  daily and apixaban 5mg  BID (CHADS2VASC score is 77 - age >69 in a month and CAD on Chest CT) -creatinine was 0.880 and K+ 4.7 earlier this month -would like him to recover from his CVA and then consider DCCV in the future to see if SOB improves -will get split night sleep study given afib and CVA as well as snoring and daytime sleepiness  2.  HLD -LDL goal < 70 due to coronary calcifications -last LDL was 106 -continue Crestor 5mg  3 times weekly and refer to lipid clinic to consider PCSK9i or  Bempedoic acid  3.  Coronary artery calcifications -Chest CT in 2016 showed 3 vessel coronary artery calcifications -nuclear stress test normal in 2019 -denies any chest pain but still has DOE and now EF 45-50% on echo earlier this month -continue statin -cannot get coronary calcium score due to afib -will repeat nuclear stress test to rule out ischemia in setting of decreased EF -no ASA due to DOAC  4.  CVA -felt cardioembolic from afib -now on lifetime DOAC    Medication Adjustments/Labs and Tests Ordered: Current medicines are reviewed at length with the patient today.  Concerns regarding medicines are outlined above.  Orders Placed This Encounter  Procedures  . AMB Referral to [facility]  . MYOCARDIAL PERFUSION IMAGING  . EKG 12-Lead  . Split night study   No orders of the defined types were placed in this encounter.   Signed, [name], MD  08/28/2019 12:48 PM    [place]

## 2019-08-29 ENCOUNTER — Telehealth (HOSPITAL_COMMUNITY): Payer: Self-pay

## 2019-08-29 NOTE — Telephone Encounter (Signed)
Spoke with the patient, detailed instructions given. He stated that he understood and would be here for his test. Asked to call back with any questions. S.Shaylene Paganelli EMTP 

## 2019-09-04 DIAGNOSIS — N401 Enlarged prostate with lower urinary tract symptoms: Secondary | ICD-10-CM | POA: Diagnosis not present

## 2019-09-04 DIAGNOSIS — I4891 Unspecified atrial fibrillation: Secondary | ICD-10-CM | POA: Diagnosis not present

## 2019-09-04 DIAGNOSIS — M179 Osteoarthritis of knee, unspecified: Secondary | ICD-10-CM | POA: Diagnosis not present

## 2019-09-04 DIAGNOSIS — I639 Cerebral infarction, unspecified: Secondary | ICD-10-CM | POA: Diagnosis not present

## 2019-09-04 DIAGNOSIS — D649 Anemia, unspecified: Secondary | ICD-10-CM | POA: Diagnosis not present

## 2019-09-04 DIAGNOSIS — E7800 Pure hypercholesterolemia, unspecified: Secondary | ICD-10-CM | POA: Diagnosis not present

## 2019-09-05 ENCOUNTER — Other Ambulatory Visit: Payer: Self-pay

## 2019-09-05 ENCOUNTER — Ambulatory Visit (HOSPITAL_COMMUNITY): Payer: PPO | Attending: Internal Medicine

## 2019-09-05 ENCOUNTER — Telehealth: Payer: Self-pay | Admitting: *Deleted

## 2019-09-05 DIAGNOSIS — R0600 Dyspnea, unspecified: Secondary | ICD-10-CM

## 2019-09-05 LAB — MYOCARDIAL PERFUSION IMAGING
LV dias vol: 153 mL (ref 62–150)
LV sys vol: 93 mL
Peak HR: 93 {beats}/min
Rest HR: 73 {beats}/min
SDS: 4
SRS: 2
SSS: 6
TID: 1.02

## 2019-09-05 MED ORDER — TECHNETIUM TC 99M TETROFOSMIN IV KIT
10.9000 | PACK | Freq: Once | INTRAVENOUS | Status: AC | PRN
  Administered 2019-09-05: 10.9 via INTRAVENOUS
  Filled 2019-09-05: qty 11

## 2019-09-05 MED ORDER — REGADENOSON 0.4 MG/5ML IV SOLN
0.4000 mg | Freq: Once | INTRAVENOUS | Status: AC
  Administered 2019-09-05: 0.4 mg via INTRAVENOUS

## 2019-09-05 MED ORDER — TECHNETIUM TC 99M TETROFOSMIN IV KIT
31.1000 | PACK | Freq: Once | INTRAVENOUS | Status: AC | PRN
  Administered 2019-09-05: 31.1 via INTRAVENOUS
  Filled 2019-09-05: qty 32

## 2019-09-05 NOTE — Telephone Encounter (Signed)
Staff message sent to [place]. Ok to schedule sleep study. HTA Auth received Auth # [id number]. Valid dates 09/05/19 to 12/04/19.

## 2019-09-06 ENCOUNTER — Ambulatory Visit (HOSPITAL_COMMUNITY): Payer: PPO

## 2019-09-11 ENCOUNTER — Encounter: Payer: Self-pay | Admitting: Adult Health

## 2019-09-11 ENCOUNTER — Other Ambulatory Visit: Payer: Self-pay

## 2019-09-11 ENCOUNTER — Ambulatory Visit: Payer: PPO | Admitting: Adult Health

## 2019-09-11 VITALS — BP 102/64 | HR 60 | Temp 97.4°F | Ht 72.0 in | Wt 284.0 lb

## 2019-09-11 DIAGNOSIS — I4819 Other persistent atrial fibrillation: Secondary | ICD-10-CM

## 2019-09-11 DIAGNOSIS — E785 Hyperlipidemia, unspecified: Secondary | ICD-10-CM | POA: Diagnosis not present

## 2019-09-11 DIAGNOSIS — I639 Cerebral infarction, unspecified: Secondary | ICD-10-CM

## 2019-09-11 NOTE — Progress Notes (Signed)
I agree with the above plan 

## 2019-09-11 NOTE — Progress Notes (Signed)
[facility] [address] [place]. [place] [address] [phone]       HOSPITAL FOLLOW UP NOTE  [name]. Date of Birth:  [birth year] Medical Record Number:  [id number]   Reason for Referral:  hospital stroke follow up    CHIEF COMPLAINT:  Chief Complaint  Patient presents with  . Follow-up    rm 9, with wife, hospital fu    HPI:  [name]. is a 74 y.o. male with history of atrial fibrillation not on [facility] d/t low CHADSVASC score who presented on 08/06/2019 with recurrent aphasia.  Evaluated by stroke team and Dr. [name] with stroke work-up revealing left frontal lobe infarct embolic pattern in setting of known AF not on AC.  Recommend initiating Eliquis for secondary stroke prevention with CHA2DS2-VASc score 3 (prior 1).  LDL 106 previously on Crestor with history of multiple statin intolerances and recommended consideration of PCSK9 at outpatient follow-up for optimal cholesterol control.  No prior history of HTN or DM.  No prior history of stroke.  Discharged home in stable condition without therapy needs.  Stroke:   L frontal lobe infarct embolic in setting of known AF not on [facility]  Code Stroke CT head No acute abnormality. ASPECTS 10.     CTA head & neck no LVO, mild atherosclerosis head and neck. 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 Sinus dz  2D Echo ejection fraction 45 to 50%.  Mild left ventricular hypertrophy.  No cardiac source of embolism.    LDL 106  HgbA1c 5.8  Eliquis for VTE prophylaxis  aspirin 81 mg daily prior to admission, now on Eliquis (apixaban) daily. Continue Eliquis at d/c  Therapy recommendations: No PT OT needed   disposition: Home    Today, 09/11/2019, [name] is being seen for hospital follow-up complaint by his wife.  He has recovered well from a stroke standpoint without residual deficits or  new/reoccurring stroke/TIAs.  He continues on Eliquis without bleeding or bruising.  Continues on Crestor 5 mg 3 times weekly but recently referred to lipid clinic for potential initiation of PCSK9 inhibitor with history of statin intolerance.  Blood pressure today 102/64 and reports occasional episodes of lower blood pressure with dizziness and feels as though related to dehydration. Blood pressure at home typically 120-130/80s.  He also plans on undergoing split-night study for possible sleep apnea at the end of this month.  Continues to follow with cardiology and PCP regularly for chronic disease management.  No concerns at this time.     ROS:   14 system review of systems performed and negative with exception of no complaints  PMH:  Past Medical History:  Diagnosis Date  . Abnormal screening CT of chest    coronary calcium in left main and RCA  . Anemia   . Arthritis   . Complication of anesthesia    Pt reports difficult urinating  . Dyspnea    with some activity  . GERD (gastroesophageal reflux disease)    occasional  . Glaucoma   . Hyperlipidemia    statin intolerant at doses > Crestor 5mg  3 times weekly  . PAF (paroxysmal atrial fibrillation) (HCC)    CHADS2VASC score 2 on apixaban    PSH:  Past Surgical History:  Procedure Laterality Date  . EYE SURGERY     bilateral cataracts  .  KNEE ARTHROSCOPY W/ DEBRIDEMENT     left   . RETINAL DETACHMENT SURGERY Bilateral   . TOTAL KNEE ARTHROPLASTY Left 01/01/2018   Procedure: LEFT TOTAL KNEE ARTHROPLASTY;  Surgeon: Gaynelle Arabian, MD;  Location: [facility];  Service: Orthopedics;  Laterality: Left;  50 mins  . TOTAL KNEE ARTHROPLASTY Right 06/25/2018   Procedure: TOTAL KNEE ARTHROPLASTY;  Surgeon: Gaynelle Arabian, MD;  Location: [facility];  Service: Orthopedics;  Laterality: Right;  29min  . VASECTOMY      Social History:  Social History   Socioeconomic History  . Marital status: Married    Spouse name: Not on file  . Number of  children: Not on file  . Years of education: Not on file  . Highest education level: Not on file  Occupational History  . Not on file  Tobacco Use  . Smoking status: Never Smoker  . Smokeless tobacco: Never Used  Substance and Sexual Activity  . Alcohol use: Yes    Alcohol/week: 1.0 standard drinks    Types: 1 Cans of beer per week    Comment: Rare  . Drug use: No  . Sexual activity: Not Currently  Other Topics Concern  . Not on file  Social History Narrative  . Not on file   Social Determinants of Health   Financial Resource Strain:   . Difficulty of Paying Living Expenses:   Food Insecurity:   . Worried About Charity fundraiser in the Last Year:   . Arboriculturist in the Last Year:   Transportation Needs:   . Film/video editor (Medical):   Marland Kitchen Lack of Transportation (Non-Medical):   Physical Activity:   . Days of Exercise per Week:   . Minutes of Exercise per Session:   Stress:   . Feeling of Stress :   Social Connections:   . Frequency of Communication with Friends and Family:   . Frequency of Social Gatherings with Friends and Family:   . Attends Religious Services:   . Active Member of Clubs or Organizations:   . Attends Archivist Meetings:   Marland Kitchen Marital Status:   Intimate Partner Violence:   . Fear of Current or Ex-Partner:   . Emotionally Abused:   Marland Kitchen Physically Abused:   . Sexually Abused:     Family History:  Family History  Problem Relation Age of Onset  . Lung cancer Mother        smoker    Medications:   Current Outpatient Medications on File Prior to Visit  Medication Sig Dispense Refill  . apixaban (ELIQUIS) 5 MG TABS tablet Take 1 tablet (5 mg total) by mouth 2 (two) times daily. 60 tablet 0  . brimonidine (ALPHAGAN) 0.15 % ophthalmic solution Place 1 drop into the right eye 2 (two) times daily.    . cetirizine (ZYRTEC) 10 MG tablet Take 10 mg by mouth every evening.    . cholecalciferol (VITAMIN D) 25 MCG (1000 UNIT) tablet  Take 1,000 Units by mouth daily.    . ferrous sulfate 325 (65 FE) MG tablet Take 325 mg by mouth daily with breakfast.    . finasteride (PROSCAR) 5 MG tablet Take 5 mg by mouth daily.    . fluticasone (FLONASE) 50 MCG/ACT nasal spray Place 2 sprays into both nostrils 2 (two) times daily.     . Glucosamine 750 MG TABS Take 750 mg by mouth daily.    . Multiple Vitamins-Minerals (CENTRUM MEN PO) Take 1 tablet by  mouth daily.    . rosuvastatin (CRESTOR) 5 MG tablet Take 5 mg by mouth 3 (three) times a week. M-W-F    . sildenafil (REVATIO) 20 MG tablet Take 40-100 mg by mouth daily as needed (erectile dysfunction.).   5  . metoprolol succinate (TOPROL-XL) 25 MG 24 hr tablet Take 1 tablet by mouth as needed.     No current facility-administered medications on file prior to visit.    Allergies:   Allergies  Allergen Reactions  . Xarelto [Rivaroxaban] Rash    Patient denies allergy as of 08/07/19  . Penicillins Other (See Comments)    Very flushed  Has patient had a PCN reaction causing immediate rash, facial/tongue/throat swelling, SOB or lightheadedness with hypotension: No Has patient had a PCN reaction causing severe rash involving mucus membranes or skin necrosis: No Has patient had a PCN reaction that required hospitalization No Has patient had a PCN reaction occurring within the last 10 years: No If all of the above answers are "NO", then may proceed with Cephalosporin use.   . Statins Other (See Comments)    Pt c/o muscle aches with use of statins.  . Vancomycin Rash     Physical Exam  Vitals:   09/11/19 1111  BP: 102/64  Pulse: 60  Temp: (!) 97.4 F (36.3 C)  Weight: 284 lb (128.8 kg)  Height: 6' (1.829 m)   Body mass index is 38.52 kg/m. No exam data present  General: well developed, well nourished,  pleasant elderly Caucasian male, seated, in no evident distress Head: head normocephalic and atraumatic.   Neck: supple with no carotid or supraclavicular  bruits Cardiovascular: irregular rate and rhythm, no murmurs Musculoskeletal: no deformity Skin:  no rash/petichiae Vascular:  Normal pulses all extremities   Neurologic Exam Mental Status: Awake and fully alert.   Normal speech and language.  Oriented to place and time. Recent and remote memory intact. Attention span, concentration and fund of knowledge appropriate. Mood and affect appropriate.  Cranial Nerves: Fundoscopic exam reveals sharp disc margins. Pupils equal, briskly reactive to light. Extraocular movements full without nystagmus. Visual fields full to confrontation. Hearing intact. Facial sensation intact. Face, tongue, palate moves normally and symmetrically.  Motor: Normal bulk and tone. Normal strength in all tested extremity muscles. Sensory.: intact to touch , pinprick , position and vibratory sensation.  Coordination: Rapid alternating movements normal in all extremities. Finger-to-nose and heel-to-shin performed accurately bilaterally. Gait and Station: Arises from chair without difficulty. Stance is normal. Gait demonstrates normal stride length and balance Reflexes: 1+ and symmetric. Toes downgoing.     NIHSS  0 Modified Rankin  0 CHA2DS2-VASc Score = 3, ?2 oral anticoagulation recommended             Age in Years:  14-74   +1                     Sex:  Male   0               Hypertension History:  0                       Diabetes Mellitus:  0    Congestive Heart Failure History:  0             Vascular Disease History:  0  Stroke/TIA/Thromboembolism History:  yes   +2 HAS-BLED 2      ASSESSMENT: Joshua Moran. is a 74 y.o. year old male presented with aphasia on 08/06/2019 with stroke work-up revealing left frontal lobe infarct embolic pattern secondary to known AF not on [facility] therefore Eliquis initiated. Vascular risk factors include A. fib not on AC due to prior CHA2DS2-VASc score of 1.  He has recovered well from a stroke standpoint  without residual deficits.    PLAN:  1. Left frontal lobe stroke: Continue Eliquis (apixaban) daily  and Crestor 5 mg 3 days weekly with history of statin intolerance for secondary stroke prevention.  Plans on evaluation of lipid clinic for initiation of PCSK9 inhibitor.  Maintain strict control of hypertension with blood pressure goal below 130/90, diabetes with hemoglobin A1c goal below 6.5% and cholesterol with LDL cholesterol (bad cholesterol) goal below 70 mg/dL.  I also advised the patient to eat a healthy diet with plenty of whole grains, cereals, fruits and vegetables, exercise regularly with at least 30 minutes of continuous activity daily and maintain ideal body weight. 2. HLD: History of statin intolerance.  Continue current regimen and ensure initial evaluation with lipid clinic for possible start of PCSK9 inhibitor for optimal management 3. Atrial fibrillation: Continuation of Eliquis and ongoing follow-up with cardiology for prescribing, monitoring and management     Follow up in 4 months or call earlier if needed   I spent 42 minutes of face-to-face and non-face-to-face time with patient.  This included previsit chart review, lab review, study review, order entry, electronic health record documentation, patient education regarding recent stroke, atrial fibrillation history not on [facility], importance of managing stroke risk factors and answered all questions to patient and wife satisfaction    Frann Rider, AGNP-BC  [facility] [address] [place], [place] [address]  Phone [phone] Fax [phone] Note: This document was prepared with digital dictation and possible smart phrase technology. Any transcriptional errors that result from this process are unintentional.

## 2019-09-11 NOTE — Patient Instructions (Signed)
Continue Eliquis (apixaban) daily and Crestor 5mg  MWF  for secondary stroke prevention  Continue to follow with cardiology for atrial fibrillation and eliquis management   Continue to follow up with PCP regarding cholesterol and blood pressure management   Continue to monitor blood pressure at home  Maintain strict control of hypertension with blood pressure goal below 130/90, diabetes with hemoglobin A1c goal below 6.5% and cholesterol with LDL cholesterol (bad cholesterol) goal below 70 mg/dL. I also advised the patient to eat a healthy diet with plenty of whole grains, cereals, fruits and vegetables, exercise regularly and maintain ideal body weight.  Followup in the future with me in 4 months or call earlier if needed       Thank you for coming to see [place] at [facility]. I hope we have been able to provide you high quality care today.  You may receive a patient satisfaction survey over the next few weeks. We would appreciate your feedback and comments so that we may continue to improve ourselves and the health of our patients.

## 2019-09-13 ENCOUNTER — Encounter (HOSPITAL_BASED_OUTPATIENT_CLINIC_OR_DEPARTMENT_OTHER): Payer: Self-pay

## 2019-09-13 NOTE — Telephone Encounter (Signed)
Patient is scheduled for lab study on 10/01/19. Pt is scheduled for COVID screening on 4/24//21 prior to SS. Patient understands his sleep study will be done at [facility] sleep lab. Patient understands he will receive a sleep packet in a week or so. Patient understands to call if he does not receive the sleep packet in a timely manner. Patient agrees with treatment and thanked me for call.

## 2019-09-16 ENCOUNTER — Telehealth: Payer: Self-pay

## 2019-09-16 ENCOUNTER — Ambulatory Visit (INDEPENDENT_AMBULATORY_CARE_PROVIDER_SITE_OTHER): Payer: PPO | Admitting: Pharmacist

## 2019-09-16 ENCOUNTER — Telehealth: Payer: Self-pay | Admitting: Pharmacist

## 2019-09-16 ENCOUNTER — Ambulatory Visit: Payer: PPO | Admitting: Cardiology

## 2019-09-16 ENCOUNTER — Other Ambulatory Visit: Payer: Self-pay

## 2019-09-16 DIAGNOSIS — E785 Hyperlipidemia, unspecified: Secondary | ICD-10-CM | POA: Diagnosis not present

## 2019-09-16 DIAGNOSIS — R0600 Dyspnea, unspecified: Secondary | ICD-10-CM

## 2019-09-16 NOTE — Progress Notes (Signed)
Patient ID: Joshua Moran.                 DOB: 1946/06/01                    MRN: [id number]     HPI: Joshua Moran. is a 74 y.o. male patient referred to lipid clinic by Dr. Radford Pax. PMH is significant for coronary calcifications of the LM and RCA, PAF, HLD, GERD, mild pulmonary HTN, and recent cardioembolic CVA. He has a hx of HLD and has been on Crestor 5mg  3 times weekly but has been intolerant to any higher dose.  His last LDL was 106 earlier this month. Patient was referred to lipid clinic for further management of cholesterol for risk reduction.   Patient presents today along with his wife for initial visit with lipid clinic. He states he has tried several statins in the past and had leg pains with them all. He is tolerating rosuvastatin 5mg  three times a week currently. Exercise is sporadic. Diet includes high fat diary products.   [employer]- Repatha $180/90 days supply $90/30 day supply  Current Medications: rosuvastatin 5mg  three times a week Intolerances: rousvastatin 5mg  daily (msucle/leg pains), atorvastatin Risk Factors: CAD on CT, CVA LDL goal: <70  Diet: 2-3 times per week- eat out Breakfast: cereal (raisen brain, frosted wheat), waffles with honey, smoothies (fruit/yogurt, milk, protein powder), egg, oatmeal Whole milk, regular yougurt Lunch: left overs, sandwich, cottage cheese with olives, cheese/crackers-meat stick Dinner: meat and veggies chicken, salmon, hamburger, pork-chops, roast (grill or air fryer)  Exercise: sporatic- [employer]- walk on treadmill for 30 min, various machines for another 30 min, some dumbell work- yard work  Family History: he patient's family history includes Lung cancer in his mother.  Social History: never smoked  Labs:08/07/19 TC 175, TG 109, HDL 47, LDL 106  Past Medical History:  Diagnosis Date  . Abnormal screening CT of chest    coronary calcium in left main and RCA  . Anemia   . Arthritis   . Complication of anesthesia     Pt reports difficult urinating  . Dyspnea    with some activity  . GERD (gastroesophageal reflux disease)    occasional  . Glaucoma   . Hyperlipidemia    statin intolerant at doses > Crestor 5mg  3 times weekly  . PAF (paroxysmal atrial fibrillation) (HCC)    CHADS2VASC score 2 on apixaban    Current Outpatient Medications on File Prior to Visit  Medication Sig Dispense Refill  . apixaban (ELIQUIS) 5 MG TABS tablet Take 1 tablet (5 mg total) by mouth 2 (two) times daily. 60 tablet 0  . brimonidine (ALPHAGAN) 0.15 % ophthalmic solution Place 1 drop into the right eye 2 (two) times daily.    . cetirizine (ZYRTEC) 10 MG tablet Take 10 mg by mouth every evening.    . cholecalciferol (VITAMIN D) 25 MCG (1000 UNIT) tablet Take 1,000 Units by mouth daily.    . ferrous sulfate 325 (65 FE) MG tablet Take 325 mg by mouth daily with breakfast.    . finasteride (PROSCAR) 5 MG tablet Take 5 mg by mouth daily.    . fluticasone (FLONASE) 50 MCG/ACT nasal spray Place 2 sprays into both nostrils 2 (two) times daily.     . Glucosamine 750 MG TABS Take 750 mg by mouth daily.    . metoprolol succinate (TOPROL-XL) 25 MG 24 hr tablet Take 1 tablet by mouth as  needed.    . Multiple Vitamins-Minerals (CENTRUM MEN PO) Take 1 tablet by mouth daily.    . rosuvastatin (CRESTOR) 5 MG tablet Take 5 mg by mouth 3 (three) times a week. M-W-F    . sildenafil (REVATIO) 20 MG tablet Take 40-100 mg by mouth daily as needed (erectile dysfunction.).   5   No current facility-administered medications on file prior to visit.    Allergies  Allergen Reactions  . Xarelto [Rivaroxaban] Rash    Patient denies allergy as of 08/07/19  . Penicillins Other (See Comments)    Very flushed  Has patient had a PCN reaction causing immediate rash, facial/tongue/throat swelling, SOB or lightheadedness with hypotension: No Has patient had a PCN reaction causing severe rash involving mucus membranes or skin necrosis: No Has patient had  a PCN reaction that required hospitalization No Has patient had a PCN reaction occurring within the last 10 years: No If all of the above answers are "NO", then may proceed with Cephalosporin use.   . Statins Other (See Comments)    Pt c/o muscle aches with use of statins.  . Vancomycin Rash    Assessment/Plan:  1. Hyperlipidemia - LDL is above goal of <70. Discussed some improvements that could be made to diet including changing from whole milk to 1-2% milk, low fat yogurt, lean meats and limiting cheese and processed meats. We also discussed importance of consistent exercise. Medication wise, we discussed role statins play in cardiovascular risk reduction. We discussed LDL goal and medication options to get him there. Zetia would not provide enough LDL reduction to get to <70. Discussed PCSK9i LDL reduction, side effects, injection technique and cost. Patient is interested in trying. I will submit prior authorization for Repatha as this is preferred on his plan. Will also apply for [employer] once patient provided income information. Plan- continue rosuvastatin 5mg  three times a week and ADD Repatha 140mg  every 14 days.   Thank you,  Ramond Dial, Pharm.D, BCPS, CPP [facility]  [id number] [place], [place] [address]  Phone: [phone]; Fax: [phone]

## 2019-09-16 NOTE — Telephone Encounter (Signed)
-----   Message from Sueanne Margarita, MD sent at 09/12/2019  3:58 PM EDT ----- No significant change from prior studies.  EF on recent echo 45-50% which is similar to prior EF on nuclear stress test in that past - continue current therapy.  Repeat PFTs with DLCO to workup SOB

## 2019-09-16 NOTE — Telephone Encounter (Signed)
Prior auth for Repatha approved through 11/15/19. Patient made aware. He needs to look up income so I can apply for [employer]. He will call me back with that information.

## 2019-09-16 NOTE — Patient Instructions (Addendum)
It was a pleasure to meet you today!  Please try to reduce your milk to 2%. Continue to avoid fried foods and limit your cheese consumption.  Give [place] a call at [phone] with any questions or concerns.  I will call you once the prior authorization is approved.  We will just need your household income to complete the application for the [employer].  Repatha is a cholesterol medication that improved your body's ability to get rid of "bad cholesterol" known as LDL. It can lower your LDL up to 60%! It is an injection that is given under the skin every 2 weeks. The medication often requires a prior authorization from your insurance company. We will take care of submitting all the necessary information to your insurance company to get it approved.  Instructions for injection:  1. Remove Pen from the refrigerator at least 30 minutes prior to injecting. Allow to come to room temperature. Do NOT try to warm the pen using a heat source, Do NOT leave in direct sunlight, Do NOT shake 2. Inspect pen. Do not use if pen is expired, cracked, medication is cloudy or discolored or if orange cap is missing 3. Clean the injection site with an alcohol swab or soapy water and dry completley. Always rotate injection sites. Do not use the same site as your previous injection. Do not inject in an area that is tender, bruised, red or hard.   4. Pull the orange cap off when you are ready to inject. Do not leave cap off for more than 5 minutes. Do not put the cap back on. 5. Create a firm surface area by either using the stretch method or the pinch method 6. Keep holding the stretched or pinched skin. Put the yellow safety guard on your skin at a 90 degree angle.  7. Firmly push down the pen onto the skin until it stops moving. When you are ready to inject push the gray button, you will hear a click.   8. Keep pushing the pen down on your skin until you hear the SECOND click (could take about 15 seconds). You  then may remove the pen. The window on the view finder should be yellow. If it has not turned yellow, then all of the medication was not injected. 9. Dispose of the pen in a sharps container. If you do not have a sharps container you may use a container that is made of heavy plastic (such as a laundry detergent container that has a puncture proof lid). Label it with a marker as "hazardous waste"   Visit https://www.pi.amgen.com/~/media/amgen/[email or web address]-amgen-com/repatha/repatha_ifu_hcp_pt_english.pdf for more detailed instructions

## 2019-09-16 NOTE — Telephone Encounter (Signed)
The patient has been notified of the result and verbalized understanding.  All questions (if any) were answered. Antonieta Iba, RN 09/16/2019 11:52 AM  Orders placed fro PFTs.

## 2019-09-19 ENCOUNTER — Telehealth: Payer: Self-pay | Admitting: Pharmacist

## 2019-09-19 DIAGNOSIS — E785 Hyperlipidemia, unspecified: Secondary | ICD-10-CM

## 2019-09-19 MED ORDER — REPATHA SURECLICK 140 MG/ML ~~LOC~~ SOAJ: 1.0000 "pen " | SUBCUTANEOUS | 11 refills | Status: DC

## 2019-09-19 NOTE — Telephone Encounter (Signed)
Patient approved for [employer] grant for 2,500  Pharmacy Card Id [id number] Group [id number] PCN [id number] BIN [id number]  Patient made aware. Rx and copay card info sent to [facility].  Will get labs at appointment in June with Dr. Radford Pax. I will order a direct LDL as well since appointment is in the afternoon.

## 2019-09-24 DIAGNOSIS — M179 Osteoarthritis of knee, unspecified: Secondary | ICD-10-CM | POA: Diagnosis not present

## 2019-09-24 DIAGNOSIS — N401 Enlarged prostate with lower urinary tract symptoms: Secondary | ICD-10-CM | POA: Diagnosis not present

## 2019-09-24 DIAGNOSIS — D649 Anemia, unspecified: Secondary | ICD-10-CM | POA: Diagnosis not present

## 2019-09-24 DIAGNOSIS — I639 Cerebral infarction, unspecified: Secondary | ICD-10-CM | POA: Diagnosis not present

## 2019-09-24 DIAGNOSIS — E7800 Pure hypercholesterolemia, unspecified: Secondary | ICD-10-CM | POA: Diagnosis not present

## 2019-09-24 DIAGNOSIS — I4891 Unspecified atrial fibrillation: Secondary | ICD-10-CM | POA: Diagnosis not present

## 2019-09-28 ENCOUNTER — Other Ambulatory Visit (HOSPITAL_COMMUNITY)
Admission: RE | Admit: 2019-09-28 | Discharge: 2019-09-28 | Disposition: A | Discharge status: Home / Self Care | Payer: PPO | Source: Ambulatory Visit | Attending: Cardiology | Admitting: Cardiology

## 2019-09-28 DIAGNOSIS — Z20822 Contact with and (suspected) exposure to covid-19: Secondary | ICD-10-CM | POA: Diagnosis not present

## 2019-09-28 DIAGNOSIS — Z01812 Encounter for preprocedural laboratory examination: Secondary | ICD-10-CM | POA: Insufficient documentation

## 2019-09-29 LAB — SARS CORONAVIRUS 2 (TAT 6-24 HRS): SARS Coronavirus 2: NEGATIVE

## 2019-10-01 ENCOUNTER — Other Ambulatory Visit: Payer: Self-pay

## 2019-10-01 ENCOUNTER — Ambulatory Visit (HOSPITAL_BASED_OUTPATIENT_CLINIC_OR_DEPARTMENT_OTHER): Payer: PPO | Attending: Cardiology | Admitting: Cardiology

## 2019-10-01 DIAGNOSIS — G4733 Obstructive sleep apnea (adult) (pediatric): Secondary | ICD-10-CM | POA: Insufficient documentation

## 2019-10-01 DIAGNOSIS — I639 Cerebral infarction, unspecified: Secondary | ICD-10-CM | POA: Diagnosis not present

## 2019-10-01 DIAGNOSIS — I480 Paroxysmal atrial fibrillation: Secondary | ICD-10-CM | POA: Insufficient documentation

## 2019-10-09 NOTE — Procedures (Signed)
   Patient Name: Regino, Serie Date: 10/01/2019 Gender: Male D.O.B: July 18, 1945 Age (years): 3 Referring Provider: [name] MD, ABSM Height (inches): 72 Interpreting Physician: [name] MD, ABSM Weight (lbs): 280 RPSGT: Laren Everts BMI: 38 MRN: [id number] Neck Size: 16.00  CLINICAL INFORMATION Sleep Study Type: NPSG  Indication for sleep study: Obesity, Snoring  Epworth Sleepiness Score: 2  SLEEP STUDY TECHNIQUE As per the AASM Manual for the Scoring of Sleep and Associated Events v2.3 (April 2016) with a hypopnea requiring 4% desaturations.  The channels recorded and monitored were frontal, central and occipital EEG, electrooculogram (EOG), submentalis EMG (chin), nasal and oral airflow, thoracic and abdominal wall motion, anterior tibialis EMG, snore microphone, electrocardiogram, and pulse oximetry.  MEDICATIONS Medications self-administered by patient taken the night of the study : N/A  SLEEP ARCHITECTURE The study was initiated at 10:45:09 PM and ended at 4:49:22 AM.  Sleep onset time was 141.8 minutes and the sleep efficiency was 16.2%. The total sleep time was 59 minutes.  Stage REM latency was N/A minutes.  The patient spent 52.5% of the night in stage N1 sleep, 47.5% in stage N2 sleep, 0.0% in stage N3 and 0% in REM.  Alpha intrusion was absent.  Supine sleep was 0.00%.  RESPIRATORY PARAMETERS The overall apnea/hypopnea index (AHI) was 26.4 per hour. There were 3 total apneas, including 3 obstructive, 0 central and 0 mixed apneas. There were 23 hypopneas and 19 RERAs.  The AHI during Stage REM sleep was N/A per hour.  AHI while supine was N/A per hour.  The mean oxygen saturation was 93.0%. The minimum SpO2 during sleep was 90.0%.  soft snoring was noted during this study.  CARDIAC DATA The 2 lead EKG demonstrated atrial fibrillation. The mean heart rate was 70.8 beats per minute. Other EKG findings include: PVCs.  LEG MOVEMENT  DATA The total PLMS were 0 with a resulting PLMS index of 0.0. Associated arousal with leg movement index was 0.0 .  IMPRESSIONS - Moderate obstructive sleep apnea occurred during this study (AHI = 26.4/h). - No significant central sleep apnea occurred during this study (CAI = 0.0/h). - The patient had minimal or no oxygen desaturation during the study (Min O2 = 90.0%) - The patient snored with soft snoring volume. - EKG findings include PVCs. - Clinically significant periodic limb movements did not occur during sleep. No significant associated arousals.  DIAGNOSIS - Obstructive Sleep Apnea (327.23 [G47.33 ICD-10])  RECOMMENDATIONS - Therapeutic CPAP titration to determine optimal pressure required to alleviate sleep disordered breathing. - Avoid alcohol, sedatives and other CNS depressants that may worsen sleep apnea and disrupt normal sleep architecture. - Sleep hygiene should be reviewed to assess factors that may improve sleep quality. - Weight management and regular exercise should be initiated or continued if appropriate.  [Electronically signed] 10/09/2019 09:16 PM  [name] MD, ABSM Diplomate, American Board of Sleep Medicine

## 2019-10-11 ENCOUNTER — Encounter: Payer: Self-pay | Admitting: *Deleted

## 2019-10-11 DIAGNOSIS — G4733 Obstructive sleep apnea (adult) (pediatric): Secondary | ICD-10-CM

## 2019-10-11 NOTE — Telephone Encounter (Signed)
-----   Message from Sueanne Margarita, MD sent at 10/09/2019  9:18 PM EDT ----- Please let patient know that they have sleep apnea and recommend CPAP titration. Please set up titration in the sleep lab.

## 2019-10-14 ENCOUNTER — Telehealth: Payer: Self-pay | Admitting: Cardiology

## 2019-10-14 NOTE — Telephone Encounter (Signed)
New message    Patient's daughter wants to know if a referral can be setup to go to a pulmonologist. Please call.

## 2019-10-14 NOTE — Telephone Encounter (Signed)
Spoke with the patient who states that he is not wanting a referral to a pulmonologist, rather he is waiting for a call to schedule PFTs per Dr. Radford Pax. Advised patient that I would send a message to our schedulers and they should be calling him to get it set up. Patient verbalized understanding.

## 2019-11-07 ENCOUNTER — Telehealth: Payer: Self-pay | Admitting: *Deleted

## 2019-11-07 NOTE — Telephone Encounter (Signed)
Informed patient of sleep study results and patient understanding was verbalized. Patient understands his sleep study showed they have sleep apnea and recommend CPAP titration. Please set up titration in the sleep lab.   Titration sent to sleep pool  

## 2019-11-07 NOTE — Telephone Encounter (Signed)
-----   Message from Sueanne Margarita, MD sent at 10/09/2019  9:18 PM EDT ----- Please let patient know that they have sleep apnea and recommend CPAP titration. Please set up titration in the sleep lab.

## 2019-11-08 NOTE — Telephone Encounter (Signed)
This encounter was created in error - please disregard.

## 2019-11-18 DIAGNOSIS — Z0000 Encounter for general adult medical examination without abnormal findings: Secondary | ICD-10-CM | POA: Diagnosis not present

## 2019-11-18 DIAGNOSIS — D649 Anemia, unspecified: Secondary | ICD-10-CM | POA: Diagnosis not present

## 2019-11-18 DIAGNOSIS — E7800 Pure hypercholesterolemia, unspecified: Secondary | ICD-10-CM | POA: Diagnosis not present

## 2019-11-18 DIAGNOSIS — D6869 Other thrombophilia: Secondary | ICD-10-CM | POA: Diagnosis not present

## 2019-11-18 DIAGNOSIS — J309 Allergic rhinitis, unspecified: Secondary | ICD-10-CM | POA: Diagnosis not present

## 2019-11-18 DIAGNOSIS — I4891 Unspecified atrial fibrillation: Secondary | ICD-10-CM | POA: Diagnosis not present

## 2019-11-18 DIAGNOSIS — Z8673 Personal history of transient ischemic attack (TIA), and cerebral infarction without residual deficits: Secondary | ICD-10-CM | POA: Diagnosis not present

## 2019-11-21 ENCOUNTER — Telehealth: Payer: Self-pay | Admitting: Cardiology

## 2019-11-21 NOTE — Telephone Encounter (Signed)
    Pt's wife calling, she would like to come in with pt to his appt on Monday 06/21, she said pt has a stroke and needs to be there to assist him. She also said she have an appt on Monday to with Dr. Radford Pax

## 2019-11-21 NOTE — Telephone Encounter (Signed)
Spoke with the patient's wife and she states that her husband still needs assistance at his appointments. She has an appointment herself with Dr. Radford Pax right after her husband. I advised her that we would try to accommodate their needs and let her know that we will make sure that she is aware of everything that Dr. Radford Pax goes over with the patient. She verbalized understanding.

## 2019-11-22 ENCOUNTER — Telehealth: Payer: Self-pay | Admitting: *Deleted

## 2019-11-22 ENCOUNTER — Other Ambulatory Visit (HOSPITAL_COMMUNITY)
Admission: RE | Admit: 2019-11-22 | Discharge: 2019-11-22 | Disposition: A | Discharge status: Home / Self Care | Payer: PPO | Source: Ambulatory Visit | Attending: Cardiology | Admitting: Cardiology

## 2019-11-22 DIAGNOSIS — Z01812 Encounter for preprocedural laboratory examination: Secondary | ICD-10-CM | POA: Diagnosis not present

## 2019-11-22 DIAGNOSIS — Z20822 Contact with and (suspected) exposure to covid-19: Secondary | ICD-10-CM | POA: Diagnosis not present

## 2019-11-22 LAB — SARS CORONAVIRUS 2 (TAT 6-24 HRS): SARS Coronavirus 2: NEGATIVE

## 2019-11-22 NOTE — Telephone Encounter (Signed)
-----   Message from Freada Bergeron, [place] sent at 11/11/2019 10:53 AM EDT ----- Regarding: precert Cpap titration

## 2019-11-25 ENCOUNTER — Encounter: Payer: Self-pay | Admitting: Cardiology

## 2019-11-25 ENCOUNTER — Other Ambulatory Visit: Payer: PPO | Admitting: *Deleted

## 2019-11-25 ENCOUNTER — Ambulatory Visit: Payer: PPO | Admitting: Cardiology

## 2019-11-25 ENCOUNTER — Other Ambulatory Visit: Payer: Self-pay

## 2019-11-25 ENCOUNTER — Ambulatory Visit (INDEPENDENT_AMBULATORY_CARE_PROVIDER_SITE_OTHER): Payer: PPO | Admitting: Internal Medicine

## 2019-11-25 ENCOUNTER — Telehealth: Payer: Self-pay | Admitting: *Deleted

## 2019-11-25 VITALS — BP 120/84 | HR 89 | Ht 72.0 in | Wt 283.0 lb

## 2019-11-25 DIAGNOSIS — E785 Hyperlipidemia, unspecified: Secondary | ICD-10-CM | POA: Diagnosis not present

## 2019-11-25 DIAGNOSIS — I639 Cerebral infarction, unspecified: Secondary | ICD-10-CM | POA: Diagnosis not present

## 2019-11-25 DIAGNOSIS — I2510 Atherosclerotic heart disease of native coronary artery without angina pectoris: Secondary | ICD-10-CM

## 2019-11-25 DIAGNOSIS — G4733 Obstructive sleep apnea (adult) (pediatric): Secondary | ICD-10-CM

## 2019-11-25 DIAGNOSIS — R0600 Dyspnea, unspecified: Secondary | ICD-10-CM | POA: Diagnosis not present

## 2019-11-25 DIAGNOSIS — I4819 Other persistent atrial fibrillation: Secondary | ICD-10-CM

## 2019-11-25 LAB — PULMONARY FUNCTION TEST
DL/VA % pred: 94 %
DL/VA: 3.72 ml/min/mmHg/L
DLCO cor % pred: 112 %
DLCO cor: 29.98 ml/min/mmHg
DLCO unc % pred: 112 %
DLCO unc: 29.98 ml/min/mmHg
FEF 25-75 Post: 3.87 L/sec
FEF 25-75 Pre: 3.19 L/sec
FEF2575-%Change-Post: 21 %
FEF2575-%Pred-Post: 160 %
FEF2575-%Pred-Pre: 132 %
FEV1-%Change-Post: 5 %
FEV1-%Pred-Post: 119 %
FEV1-%Pred-Pre: 112 %
FEV1-Post: 3.96 L
FEV1-Pre: 3.75 L
FEV1FVC-%Change-Post: 0 %
FEV1FVC-%Pred-Pre: 102 %
FEV6-%Change-Post: 5 %
FEV6-%Pred-Post: 122 %
FEV6-%Pred-Pre: 116 %
FEV6-Post: 5.3 L
FEV6-Pre: 5.02 L
FEV6FVC-%Change-Post: 0 %
FEV6FVC-%Pred-Post: 105 %
FEV6FVC-%Pred-Pre: 105 %
FVC-%Change-Post: 6 %
FVC-%Pred-Post: 116 %
FVC-%Pred-Pre: 110 %
FVC-Post: 5.34 L
FVC-Pre: 5.04 L
Post FEV1/FVC ratio: 74 %
Post FEV6/FVC ratio: 99 %
Pre FEV1/FVC ratio: 74 %
Pre FEV6/FVC Ratio: 100 %
RV % pred: 58 %
RV: 1.56 L
TLC % pred: 95 %
TLC: 7.12 L

## 2019-11-25 MED ORDER — MIDODRINE HCL 2.5 MG PO TABS: 2.5000 mg | ORAL_TABLET | Freq: Three times a day (TID) | ORAL | 3 refills | Status: DC

## 2019-11-25 NOTE — Patient Instructions (Signed)
Medication Instructions:  Your physician has recommended you make the following change in your medication:  1) START taking Proamatine (midodrine) 2.5 mg three times per day.   *If you need a refill on your cardiac medications before your next appointment, please call your pharmacy*   Lab Work: TODAY: SPEP and UPEP If you have labs (blood work) drawn today and your tests are completely normal, you will receive your results only by: Marland Kitchen MyChart Message (if you have MyChart) OR . A paper copy in the mail If you have any lab test that is abnormal or we need to change your treatment, we will call you to review the results.   Testing/Procedures: Your physician has recommended that you have a Myocardial Amyloid Imaging Plantar and Spec Scan (PYP Scan)  Follow-Up: At [facility], you and your health needs are our priority.  As part of our continuing mission to provide you with exceptional heart care, we have created designated Provider Care Teams.  These Care Teams include your primary Cardiologist (physician) and Advanced Practice Providers (APPs -  Physician Assistants and Nurse Practitioners) who all work together to provide you with the care you need, when you need it.  We recommend signing up for the patient portal called "MyChart".  Sign up information is provided on this After Visit Summary.  MyChart is used to connect with patients for Virtual Visits (Telemedicine).  Patients are able to view lab/test results, encounter notes, upcoming appointments, etc.  Non-urgent messages can be sent to your provider as well.   To learn more about what you can do with MyChart, go to [email or web address].    Your next appointment:   8 week(s)  The format for your next appointment:   Virtual Visit   Provider:   [name], MD

## 2019-11-25 NOTE — Progress Notes (Addendum)
Cardiology Office Note:    Date:  11/25/2019   ID:  Joshua Moran., DOB [birth year], MRN [id number]  PCP:  Joshua Cruel, MD  Cardiologist:  Joshua Him, MD    Referring MD: Joshua Cruel, MD   Chief Complaint  Patient presents with  . Follow-up    coronary artery calcifications, PAF, HLD    History of Present Illness:    Joshua Moran. is a 74 y.o. male with a hx of coronary calcifications of the LM and RCA, PAF, HLD, GERD and retired Teacher, English as a foreign language for [employer].  He has never smoked but has been followed by Dr. Gwenlyn Moran in the past for Glide felt related to poor exercise tolerance from inactivity.  Stress myoview in 2016 was normal and 2D echo in 2019 showed normal LVF with G1DD and mild pulmonary HTN.  He saw Joshua Moran in 07/2018 and was noted to be in afib by EKG but not started on DOAC as CHADS2VASC score he felt was 1.  Then saw Dr. Gwenlyn Moran in 12/2018 for preop clearance for knee surgery and was still in afib and Dr. Gwenlyn Moran stated that  His CHADS2VASC score was 1 but will increase to 2 in May 2021 when he turned 74yo. 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  He has recovered fully from his CVA.   He had a Chest CT done in 2016 showing 3v ASCAD. He has a hx of HLD and has been on Crestor 5mg  3 times weekly but has been intolerant to any higher dose.  His last LDL was 106.  I saw Moran 3 months ago and was remaining in atrial fibrillation.  It was felt that he should recover from CVA first and then consider DCCV.  A sleep study was ordered which showed moderate OSA with an AHI of 26/hr and is set up for CPAP titration in the near future.  I would like to get Moran on PAP therapy prior to undergoing DCCV. Due to coronary artery calcifications on Chest CT, a Lexi myoview was done which showed Fixed defect at the apex/apical lateral/apical inferior walls, without evidence of ischemia. Similar to prior study. EF with global hypokinesis. No evidence of ischemia.   PFTs were also done for SOB but they have not been completed.  Since I saw Moran 3 months ago he was having dizziness with low BP in the am which he thinks is from dehydration during the night.  He got up to feed the dogs and bent over and then felt very swimmy headed and had syncope and hit his chin on a chair but says that he immediately woke up.  He has frequent problems with dizziness that is worse in the am. It is worse when bending over or when he has sat down for a while and stands up.    He is here today for followup and is doing well.  He has chronic DOE which is unchanged and has PFTs pending today. He denies any chest pain or pressure,  PND, orthopnea, LE edema,  palpitations . He is compliant with his meds and is tolerating meds with no SE.    Past Medical History:  Diagnosis Date  . Abnormal screening CT of chest    coronary calcium in left main and RCA  . Anemia   . Arthritis   . Complication of anesthesia    Pt reports difficult urinating  . Dyspnea    with  some activity  . GERD (gastroesophageal reflux disease)    occasional  . Glaucoma   . Hyperlipidemia    statin intolerant at doses > Crestor 5mg  3 times weekly  . PAF (paroxysmal atrial fibrillation) (HCC)    CHADS2VASC score 2 on apixaban    Past Surgical History:  Procedure Laterality Date  . EYE SURGERY     bilateral cataracts  . KNEE ARTHROSCOPY W/ DEBRIDEMENT     left   . RETINAL DETACHMENT SURGERY Bilateral   . TOTAL KNEE ARTHROPLASTY Left 01/01/2018   Procedure: LEFT TOTAL KNEE ARTHROPLASTY;  Surgeon: Gaynelle Arabian, MD;  Location: [facility];  Service: Orthopedics;  Laterality: Left;  50 mins  . TOTAL KNEE ARTHROPLASTY Right 06/25/2018   Procedure: TOTAL KNEE ARTHROPLASTY;  Surgeon: Gaynelle Arabian, MD;  Location: [facility];  Service: Orthopedics;  Laterality: Right;  65min  . VASECTOMY      Current Medications: Current Meds  Medication Sig  . apixaban (ELIQUIS) 5 MG TABS tablet Take 1 tablet (5 mg total) by  mouth 2 (two) times daily.  . brimonidine (ALPHAGAN) 0.15 % ophthalmic solution Place 1 drop into the right eye 2 (two) times daily.  . cetirizine (ZYRTEC) 10 MG tablet Take 10 mg by mouth every evening.  . cholecalciferol (VITAMIN D) 25 MCG (1000 UNIT) tablet Take 1,000 Units by mouth daily.  . Evolocumab (REPATHA SURECLICK) 710 MG/ML SOAJ Inject 1 pen into the skin every 14 (fourteen) days.  . ferrous sulfate 325 (65 FE) MG tablet Take 325 mg by mouth daily with breakfast.  . finasteride (PROSCAR) 5 MG tablet Take 5 mg by mouth daily.  . fluticasone (FLONASE) 50 MCG/ACT nasal spray Place 2 sprays into both nostrils 2 (two) times daily.   . Glucosamine 750 MG TABS Take 750 mg by mouth daily.  . metoprolol succinate (TOPROL-XL) 25 MG 24 hr tablet Take 1 tablet by mouth as needed.  . Multiple Vitamins-Minerals (CENTRUM MEN PO) Take 1 tablet by mouth daily.  . rosuvastatin (CRESTOR) 5 MG tablet Take 5 mg by mouth 3 (three) times a week. M-W-F  . sildenafil (REVATIO) 20 MG tablet Take 40-100 mg by mouth daily as needed (erectile dysfunction.).      Allergies:   Xarelto [rivaroxaban], Penicillins, Statins, and Vancomycin   Social History   Socioeconomic History  . Marital status: Married    Spouse name: Not on file  . Number of children: Not on file  . Years of education: Not on file  . Highest education level: Not on file  Occupational History  . Not on file  Tobacco Use  . Smoking status: Never Smoker  . Smokeless tobacco: Never Used  Vaping Use  . Vaping Use: Never used  Substance and Sexual Activity  . Alcohol use: Yes    Alcohol/week: 1.0 standard drink    Types: 1 Cans of beer per week    Comment: Rare  . Drug use: No  . Sexual activity: Not Currently  Other Topics Concern  . Not on file  Social History Narrative  . Not on file   Social Determinants of Health   Financial Resource Strain:   . Difficulty of Paying Living Expenses:   Food Insecurity:   . Worried About  Charity fundraiser in the Last Year:   . Arboriculturist in the Last Year:   Transportation Needs:   . Film/video editor (Medical):   Marland Kitchen Lack of Transportation (Non-Medical):   Physical Activity:   .  Days of Exercise per Week:   . Minutes of Exercise per Session:   Stress:   . Feeling of Stress :   Social Connections:   . Frequency of Communication with Friends and Family:   . Frequency of Social Gatherings with Friends and Family:   . Attends Religious Services:   . Active Member of Clubs or Organizations:   . Attends Archivist Meetings:   Marland Kitchen Marital Status:      Family History: The patient's family history includes Lung cancer in his mother.  ROS:   Please see the history of present illness.    ROS  All other systems reviewed and negative.   EKGs/Labs/Other Studies Reviewed:    The following studies were reviewed today: Outside labs from PCP  EKG is not done today.   Recent Labs: 08/06/2019: ALT 26 08/07/2019: BUN 14; Creatinine, Ser 0.79; Hemoglobin 14.8; Magnesium 2.0; Platelets 235; Potassium 3.6; Sodium 141   Recent Lipid Panel    Component Value Date/Time   CHOL 175 08/07/2019 0614   CHOL 177 03/14/2018 0811   TRIG 109 08/07/2019 0614   HDL 47 08/07/2019 0614   HDL 48 03/14/2018 0811   CHOLHDL 3.7 08/07/2019 0614   VLDL 22 08/07/2019 0614   LDLCALC 106 (H) 08/07/2019 0614   LDLCALC 114 (H) 03/14/2018 0811    Physical Exam:    VS:  BP 120/84   Pulse 89   Ht 6' (1.829 m)   Wt 283 lb (128.4 kg)   SpO2 96%   BMI 38.38 kg/m     Wt Readings from Last 3 Encounters:  11/25/19 283 lb (128.4 kg)  10/01/19 280 lb (127 kg)  09/11/19 284 lb (128.8 kg)    Orthostatic VS for the past 24 hrs (Last 3 readings):  BP- Lying Pulse- Lying BP- Sitting Pulse- Sitting BP- Standing at 0 minutes Pulse- Standing at 0 minutes BP- Standing at 3 minutes Pulse- Standing at 3 minutes  11/25/19 1501 131/84 82 104/69 51 90/61 52 90/60 60    GEN: Well nourished,  well developed in no acute distress HEENT: Normal NECK: No JVD; No carotid bruits LYMPHATICS: No lymphadenopathy CARDIAC:irregularly irregular, no murmurs, rubs, gallops RESPIRATORY:  Clear to auscultation without rales, wheezing or rhonchi  ABDOMEN: Soft, non-tender, non-distended MUSCULOSKELETAL:  No edema; No deformity  SKIN: Warm and dry NEUROLOGIC:  Alert and oriented x 3 PSYCHIATRIC:  Normal affect    ASSESSMENT:    1. PAF (paroxysmal atrial fibrillation) ([place])   2. Dyslipidemia, goal LDL below 100   3. Coronary artery calcification seen on CAT scan   4. Acute ischemic stroke ([place])    PLAN:    In order of problems listed above:  1.  Persistent atrial fibrillation -remains in atrial fibrillation but rate controlled on the 80's -denies any bleeding problems on apixaban and no palpitations -continue Toprol XL 25mg  daily and apixaban 5mg  BID (CHADS2VASC score is 24 - age >48 in a month and CAD on Chest CT) -creatinine was 0.880 and K+ 4.7 earlier this month -he has moderate OSA so I would like Moran to get on CPAP prior to considering DCCV -I will see Moran back in 8 weeks and if doing well with his PAP then plan DCCV  2.  HLD -LDL goal < 70 due to coronary calcifications -last LDL was 106 -continue Crestor 5mg  3 times weekly and Repatha -repeat FLP and ALT  3.  Coronary artery calcifications -Chest CT in 2016 showed 3 vessel  coronary artery calcifications -nuclear stress test normal in 2019 -denies any chest pain but still has DOE and now EF 45-50% on echo earlier this month -cannot get coronary calcium score due to afib-nuclear stress test with Fixed defect at the apex/apical lateral/apical inferior walls, without evidence of ischemia. Similar to prior study. EF with global hypokinesis. No evidence of ischemia. -continue statin and Repatha -no ASA due to DOAC  4.  CVA -felt cardioembolic from afib -now on lifetime DOAC  5.  Orthostatic hypotension -he complains of  dizziness when bending over or going from sitting to standing and actually passed out one day -he is orthostatic in the office today -I have recommended addition of  compression hose and abdominal binder -encouraged Moran to stay hydrated and drink at least 64oz of fluid daily and avoid being out in the heat -start Proamatine 2.5mg  TID -given his orthostatic hypotension, DCM and arrhythmias, recommend getting a PYP scan to rule out amyloid>>he is in afib so cannot get cardiac MRI and check SPEP and UPEP  6.  OSA -he had a sleep study showing moderate OSA with an AHI of 26/hr -he did not sleep well at the sleep lab so I will set Moran up for auto CPAP from 4 to 20cm H2O with heated humidity and mask of choice -he will see me back in 8 weeks to document compliance    Medication Adjustments/Labs and Tests Ordered: Current medicines are reviewed at length with the patient today.  Concerns regarding medicines are outlined above.  No orders of the defined types were placed in this encounter.  No orders of the defined types were placed in this encounter.   Signed, Joshua Him, MD  11/25/2019 2:51 PM    [place]

## 2019-11-25 NOTE — Addendum Note (Signed)
Addended by: Antonieta Iba on: 11/25/2019 03:49 PM   Modules accepted: Orders

## 2019-11-25 NOTE — Telephone Encounter (Signed)
OSA -he had a sleep study showing moderate OSA with an AHI of 26/hr -he did not sleep well at the sleep lab so I will set him up for auto CPAP from 4 to 20cm H2O with heated humidity and mask of choice -he will see me back in 8 weeks to document compliance   DME selection is [facility]. Patient understands he will be contacted by [facility] to set up his cpap. Patient understands to call if [facility] does not contact him with new setup in a timely manner. Patient understands they will be called once confirmation has been received from [facility] that they have received their new machine to schedule 10 week follow up appointment.  [facility] notified of new cpap order  Please add to airview Patient was grateful for the call and thanked me.

## 2019-11-25 NOTE — Telephone Encounter (Signed)
-----   Message from Antonieta Iba, RN sent at 11/25/2019  3:47 PM EDT ----- Can you send CPAP supplies to [facility] and set patient up for 8 week FU?  Thanks!

## 2019-11-25 NOTE — Progress Notes (Signed)
Full PFT performed today. °

## 2019-11-25 NOTE — Addendum Note (Signed)
Addended by: Antonieta Iba on: 11/25/2019 03:40 PM   Modules accepted: Orders

## 2019-11-26 DIAGNOSIS — I4891 Unspecified atrial fibrillation: Secondary | ICD-10-CM | POA: Diagnosis not present

## 2019-11-26 DIAGNOSIS — I639 Cerebral infarction, unspecified: Secondary | ICD-10-CM | POA: Diagnosis not present

## 2019-11-26 DIAGNOSIS — D649 Anemia, unspecified: Secondary | ICD-10-CM | POA: Diagnosis not present

## 2019-11-26 DIAGNOSIS — E7800 Pure hypercholesterolemia, unspecified: Secondary | ICD-10-CM | POA: Diagnosis not present

## 2019-11-26 DIAGNOSIS — M179 Osteoarthritis of knee, unspecified: Secondary | ICD-10-CM | POA: Diagnosis not present

## 2019-11-26 DIAGNOSIS — N401 Enlarged prostate with lower urinary tract symptoms: Secondary | ICD-10-CM | POA: Diagnosis not present

## 2019-11-26 LAB — LIPID PANEL
Chol/HDL Ratio: 2.6 ratio (ref 0.0–5.0)
Cholesterol, Total: 127 mg/dL (ref 100–199)
HDL: 49 mg/dL (ref >39)
LDL Chol Calc (NIH): 60 mg/dL (ref 0–99)
Triglycerides: 94 mg/dL (ref 0–149)
VLDL Cholesterol Cal: 18 mg/dL (ref 5–40)

## 2019-11-26 LAB — LDL CHOLESTEROL, DIRECT: LDL Direct: 62 mg/dL (ref 0–99)

## 2019-11-28 ENCOUNTER — Other Ambulatory Visit: Payer: Self-pay

## 2019-11-28 ENCOUNTER — Other Ambulatory Visit: Payer: PPO | Admitting: *Deleted

## 2019-11-28 DIAGNOSIS — I2510 Atherosclerotic heart disease of native coronary artery without angina pectoris: Secondary | ICD-10-CM

## 2019-11-28 DIAGNOSIS — G4733 Obstructive sleep apnea (adult) (pediatric): Secondary | ICD-10-CM

## 2019-11-28 DIAGNOSIS — E785 Hyperlipidemia, unspecified: Secondary | ICD-10-CM

## 2019-11-28 DIAGNOSIS — I639 Cerebral infarction, unspecified: Secondary | ICD-10-CM | POA: Diagnosis not present

## 2019-11-28 DIAGNOSIS — I4819 Other persistent atrial fibrillation: Secondary | ICD-10-CM | POA: Diagnosis not present

## 2019-11-29 LAB — PROTEIN ELECTROPHORESIS, URINE REFLEX
Albumin ELP, Urine: 100 %
Alpha-1-Globulin, U: 0 %
Alpha-2-Globulin, U: 0 %
Beta Globulin, U: 0 %
Gamma Globulin, U: 0 %
Protein, Ur: 8.1 mg/dL

## 2019-11-30 LAB — PROTEIN ELECTROPHORESIS, SERUM
A/G Ratio: 1 (ref 0.7–1.7)
Albumin ELP: 3.3 g/dL (ref 2.9–4.4)
Alpha 1: 0.2 g/dL (ref 0.0–0.4)
Alpha 2: 0.8 g/dL (ref 0.4–1.0)
Beta: 1 g/dL (ref 0.7–1.3)
Gamma Globulin: 1.2 g/dL (ref 0.4–1.8)
Globulin, Total: 3.2 g/dL (ref 2.2–3.9)
M-Spike, %: 0.5 g/dL — ABNORMAL HIGH
Total Protein: 6.5 g/dL (ref 6.0–8.5)

## 2019-12-02 ENCOUNTER — Telehealth: Payer: Self-pay

## 2019-12-02 ENCOUNTER — Telehealth: Payer: Self-pay | Admitting: Cardiology

## 2019-12-02 DIAGNOSIS — C9000 Multiple myeloma not having achieved remission: Secondary | ICD-10-CM

## 2019-12-02 NOTE — Telephone Encounter (Signed)
°  Patient was returning call regarding results, transferred call to [facility]

## 2019-12-02 NOTE — Telephone Encounter (Signed)
mb ref  

## 2019-12-02 NOTE — Telephone Encounter (Signed)
-----  Message from Sueanne Margarita, MD sent at 12/01/2019  5:50 PM EDT ----- Abnormal protein electrophoresis - please refer to Dr. Marin Olp ASAP for possible amyloid/multiple myeloma

## 2019-12-03 ENCOUNTER — Encounter: Payer: Self-pay | Admitting: *Deleted

## 2019-12-03 NOTE — Progress Notes (Signed)
Reached out to [name] to introduce myself as the office RN Navigator and explain our new patient process. Reviewed the reason for their referral and scheduled their new patient appointment along with labs. Provided address and directions to the office including call back phone number. Reviewed with patient any concerns they may have or any possible barriers to attending their appointment.   Calendar, welcome letter and office map mailed to patient home.

## 2019-12-17 DIAGNOSIS — I4891 Unspecified atrial fibrillation: Secondary | ICD-10-CM | POA: Diagnosis not present

## 2019-12-17 DIAGNOSIS — M179 Osteoarthritis of knee, unspecified: Secondary | ICD-10-CM | POA: Diagnosis not present

## 2019-12-17 DIAGNOSIS — E7800 Pure hypercholesterolemia, unspecified: Secondary | ICD-10-CM | POA: Diagnosis not present

## 2019-12-17 DIAGNOSIS — D649 Anemia, unspecified: Secondary | ICD-10-CM | POA: Diagnosis not present

## 2019-12-17 DIAGNOSIS — I639 Cerebral infarction, unspecified: Secondary | ICD-10-CM | POA: Diagnosis not present

## 2019-12-17 DIAGNOSIS — N401 Enlarged prostate with lower urinary tract symptoms: Secondary | ICD-10-CM | POA: Diagnosis not present

## 2019-12-26 ENCOUNTER — Telehealth: Payer: Self-pay | Admitting: Cardiology

## 2019-12-26 DIAGNOSIS — C9000 Multiple myeloma not having achieved remission: Secondary | ICD-10-CM

## 2019-12-26 NOTE — Telephone Encounter (Signed)
Patient states he was supposed to have some tests done, but has not heard anything about them. He states he was supposed to have a myocardial amyloid done. He did not, however there is an order that states it was completed. He would like to know if he still needs the test.

## 2019-12-26 NOTE — Telephone Encounter (Signed)
Left message for patient. He has amyloid study done on 06/21. Based on results we have referred him to see Dr. Marin Olp. He has an appointment on 07/26.

## 2019-12-27 DIAGNOSIS — G4733 Obstructive sleep apnea (adult) (pediatric): Secondary | ICD-10-CM | POA: Diagnosis not present

## 2019-12-30 ENCOUNTER — Inpatient Hospital Stay: Payer: PPO | Attending: Hematology & Oncology

## 2019-12-30 ENCOUNTER — Other Ambulatory Visit: Payer: Self-pay

## 2019-12-30 ENCOUNTER — Inpatient Hospital Stay (HOSPITAL_BASED_OUTPATIENT_CLINIC_OR_DEPARTMENT_OTHER): Payer: PPO | Admitting: Hematology & Oncology

## 2019-12-30 ENCOUNTER — Encounter: Payer: Self-pay | Admitting: Hematology & Oncology

## 2019-12-30 VITALS — BP 135/91 | HR 92 | Temp 97.9°F | Resp 18 | Ht 72.0 in | Wt 286.0 lb

## 2019-12-30 DIAGNOSIS — R42 Dizziness and giddiness: Secondary | ICD-10-CM | POA: Diagnosis not present

## 2019-12-30 DIAGNOSIS — E669 Obesity, unspecified: Secondary | ICD-10-CM

## 2019-12-30 DIAGNOSIS — I4891 Unspecified atrial fibrillation: Secondary | ICD-10-CM | POA: Diagnosis not present

## 2019-12-30 DIAGNOSIS — Z7901 Long term (current) use of anticoagulants: Secondary | ICD-10-CM | POA: Insufficient documentation

## 2019-12-30 DIAGNOSIS — D472 Monoclonal gammopathy: Secondary | ICD-10-CM

## 2019-12-30 DIAGNOSIS — Z96651 Presence of right artificial knee joint: Secondary | ICD-10-CM | POA: Diagnosis not present

## 2019-12-30 LAB — CBC WITH DIFFERENTIAL (CANCER CENTER ONLY)
Abs Immature Granulocytes: 0.02 10*3/uL (ref 0.00–0.07)
Basophils Absolute: 0 10*3/uL (ref 0.0–0.1)
Basophils Relative: 1 %
Eosinophils Absolute: 0.2 10*3/uL (ref 0.0–0.5)
Eosinophils Relative: 3 %
HCT: 43.8 % (ref 39.0–52.0)
Hemoglobin: 14.2 g/dL (ref 13.0–17.0)
Immature Granulocytes: 0 %
Lymphocytes Relative: 18 %
Lymphs Abs: 0.9 10*3/uL (ref 0.7–4.0)
MCH: 30.5 pg (ref 26.0–34.0)
MCHC: 32.4 g/dL (ref 30.0–36.0)
MCV: 94.2 fL (ref 80.0–100.0)
Monocytes Absolute: 0.5 10*3/uL (ref 0.1–1.0)
Monocytes Relative: 9 %
Neutro Abs: 3.6 10*3/uL (ref 1.7–7.7)
Neutrophils Relative %: 69 %
Platelet Count: 204 10*3/uL (ref 150–400)
RBC: 4.65 MIL/uL (ref 4.22–5.81)
RDW: 13.5 % (ref 11.5–15.5)
WBC Count: 5.2 10*3/uL (ref 4.0–10.5)
nRBC: 0 % (ref 0.0–0.2)

## 2019-12-30 LAB — LACTATE DEHYDROGENASE: LDH: 160 U/L (ref 98–192)

## 2019-12-30 LAB — CMP (CANCER CENTER ONLY)
ALT: 14 U/L (ref 0–44)
AST: 16 U/L (ref 15–41)
Albumin: 3.9 g/dL (ref 3.5–5.0)
Alkaline Phosphatase: 64 U/L (ref 38–126)
Anion gap: 7 (ref 5–15)
BUN: 15 mg/dL (ref 8–23)
CO2: 27 mmol/L (ref 22–32)
Calcium: 9.4 mg/dL (ref 8.9–10.3)
Chloride: 107 mmol/L (ref 98–111)
Creatinine: 0.97 mg/dL (ref 0.61–1.24)
GFR, Est AFR Am: >60 mL/min (ref >60)
GFR, Estimated: >60 mL/min (ref >60)
Glucose, Bld: 87 mg/dL (ref 70–99)
Potassium: 4.1 mmol/L (ref 3.5–5.1)
Sodium: 141 mmol/L (ref 135–145)
Total Bilirubin: 0.5 mg/dL (ref 0.3–1.2)
Total Protein: 6.7 g/dL (ref 6.5–8.1)

## 2019-12-30 NOTE — Progress Notes (Signed)
Referral MD  Reason for Referral: Abnormal SPEP-M spike of 0.5 g/dL  Chief Complaint  Patient presents with  . New Patient (Initial Visit)  : I was told that I had a abnormal protein my blood.  HPI: Joshua Moran is a very nice 74 year old white male.  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  He has multiple health problems.  He has atrial fibrillation.  This actually was found before he had surgery on his left knee for knee replacement.  He is on Eliquis.  He has been complained of some dizziness.  He has orthostatic hypotension.  He sees Dr. Radford Pax of cardiology.  As always, she is incredibly thorough.  She actually had an SPEP done on him.  She was worried about the possibility of amyloidosis.  The SPEP showed that there was a 0.5 g/dL monoclonal protein.  A spot urine protein electrophoresis did not show an M spike.  He is does not have any problem with rashes.  He has no issues with swallowing.  He does not have any pain in his shoulders.  She has him set up with a PYP Scan which I suppose looks for cardiac amyloid.  He has had no obvious change in bowel or bladder habits.  He says last colonoscopy was 4 years ago.  He has had no problems with the coronavirus.  He has had the vaccination.  He does not smoke.  He has a beer every now and then.  There is no problems with weight loss or weight gain.  He is somewhat on the obese side.  He has had no really major surgeries outside of the bilateral knee replacements that he has had.  Overall, I would have to say his performance status is probably ECOG 1.    Past Medical History:  Diagnosis Date  . Abnormal screening CT of chest    coronary calcium in left main and RCA  . Anemia   . Arthritis   . Complication of anesthesia    Pt reports difficult urinating  . Dyspnea    with some activity  . GERD (gastroesophageal reflux disease)    occasional  . Glaucoma   . Hyperlipidemia    statin intolerant at doses >  Crestor 30m 3 times weekly  . PAF (paroxysmal atrial fibrillation) (HCC)    CHADS2VASC score 2 on apixaban  :  Past Surgical History:  Procedure Laterality Date  . EYE SURGERY     bilateral cataracts  . KNEE ARTHROSCOPY W/ DEBRIDEMENT     left   . RETINAL DETACHMENT SURGERY Bilateral   . TOTAL KNEE ARTHROPLASTY Left 01/01/2018   Procedure: LEFT TOTAL KNEE ARTHROPLASTY;  Surgeon: AGaynelle Arabian MD;  Location: [facility];  Service: Orthopedics;  Laterality: Left;  50 mins  . TOTAL KNEE ARTHROPLASTY Right 06/25/2018   Procedure: TOTAL KNEE ARTHROPLASTY;  Surgeon: AGaynelle Arabian MD;  Location: [facility];  Service: Orthopedics;  Laterality: Right;  562m  . VASECTOMY    :   Current Outpatient Medications:  .  apixaban (ELIQUIS) 5 MG TABS tablet, Take 1 tablet (5 mg total) by mouth 2 (two) times daily., Disp: 60 tablet, Rfl: 0 .  brimonidine (ALPHAGAN) 0.15 % ophthalmic solution, Place 1 drop into the right eye 2 (two) times daily., Disp: , Rfl:  .  cetirizine (ZYRTEC) 10 MG tablet, Take 10 mg by mouth every evening., Disp: , Rfl:  .  cholecalciferol (VITAMIN D) 25 MCG (1000 UNIT) tablet, Take 1,000 Units by mouth  daily., Disp: , Rfl:  .  Evolocumab (REPATHA SURECLICK) 338 MG/ML SOAJ, Inject 1 pen into the skin every 14 (fourteen) days., Disp: 2 pen, Rfl: 11 .  ferrous sulfate 325 (65 FE) MG tablet, Take 325 mg by mouth daily with breakfast., Disp: , Rfl:  .  finasteride (PROSCAR) 5 MG tablet, Take 5 mg by mouth daily., Disp: , Rfl:  .  fluticasone (FLONASE) 50 MCG/ACT nasal spray, Place 2 sprays into both nostrils 2 (two) times daily. , Disp: , Rfl:  .  Glucosamine 750 MG TABS, Take 750 mg by mouth daily., Disp: , Rfl:  .  metoprolol succinate (TOPROL-XL) 25 MG 24 hr tablet, Take 1 tablet by mouth as needed., Disp: , Rfl:  .  Multiple Vitamins-Minerals (CENTRUM MEN PO), Take 1 tablet by mouth daily., Disp: , Rfl:  .  rosuvastatin (CRESTOR) 5 MG tablet, Take 5 mg by mouth 3 (three) times a week.  M-W-F, Disp: , Rfl:  .  sildenafil (REVATIO) 20 MG tablet, Take 40-100 mg by mouth daily as needed (erectile dysfunction.). , Disp: , Rfl: 5 .  tamsulosin (FLOMAX) 0.4 MG CAPS capsule, Take 0.4 mg by mouth daily., Disp: , Rfl: :  :  Allergies  Allergen Reactions  . Xarelto [Rivaroxaban] Rash    Patient denies allergy as of 08/07/19  . Penicillins Other (See Comments)    Very flushed  Has patient had a PCN reaction causing immediate rash, facial/tongue/throat swelling, SOB or lightheadedness with hypotension: No Has patient had a PCN reaction causing severe rash involving mucus membranes or skin necrosis: No Has patient had a PCN reaction that required hospitalization No Has patient had a PCN reaction occurring within the last 10 years: No If all of the above answers are "NO", then may proceed with Cephalosporin use.   . Statins Other (See Comments)    Pt c/o muscle aches with use of statins.  . Vancomycin Rash  :  Family History  Problem Relation Age of Onset  . Lung cancer Mother        smoker  :  Social History   Socioeconomic History  . Marital status: Married    Spouse name: Not on file  . Number of children: Not on file  . Years of education: Not on file  . Highest education level: Not on file  Occupational History  . Not on file  Tobacco Use  . Smoking status: Never Smoker  . Smokeless tobacco: Never Used  Vaping Use  . Vaping Use: Never used  Substance and Sexual Activity  . Alcohol use: Yes    Alcohol/week: 1.0 standard drink    Types: 1 Cans of beer per week    Comment: Rare  . Drug use: No  . Sexual activity: Not Currently  Other Topics Concern  . Not on file  Social History Narrative  . Not on file   Social Determinants of Health   Financial Resource Strain:   . Difficulty of Paying Living Expenses:   Food Insecurity:   . Worried About Charity fundraiser in the Last Year:   . Arboriculturist in the Last Year:   Transportation Needs:   . Consulting civil engineer (Medical):   Marland Kitchen Lack of Transportation (Non-Medical):   Physical Activity:   . Days of Exercise per Week:   . Minutes of Exercise per Session:   Stress:   . Feeling of Stress :   Social Connections:   . Frequency of Communication  with Friends and Family:   . Frequency of Social Gatherings with Friends and Family:   . Attends Religious Services:   . Active Member of Clubs or Organizations:   . Attends Archivist Meetings:   Marland Kitchen Marital Status:   Intimate Partner Violence:   . Fear of Current or Ex-Partner:   . Emotionally Abused:   Marland Kitchen Physically Abused:   . Sexually Abused:   : Review of Systems  Constitutional: Positive for malaise/fatigue.  HENT: Negative.   Eyes: Negative.   Respiratory: Positive for shortness of breath.   Cardiovascular: Positive for palpitations and orthopnea.  Gastrointestinal: Negative.   Genitourinary: Negative.   Musculoskeletal: Positive for joint pain.  Skin: Negative.   Neurological: Positive for dizziness and focal weakness.  Endo/Heme/Allergies: Negative.   Psychiatric/Behavioral: Negative.      Exam:  This is a obese white male in no obvious distress.  Vital signs show temperature of 98.  Pulse 92.  Blood pressure 135/91.  Weight is 286 pounds.  Head and neck exam shows no scleral icterus.  He has no ocular or oral lesions.  There is no enlargement of his tongue.  He has no adenopathy in the neck.  Thyroid is nonpalpable.  Lungs are clear bilaterally.  Cardiac exam irregular rate and rhythm consistent with atrial fibrillation.  The rate is well controlled.  Abdomen is obese but soft.  He has good bowel sounds.  There is no guarding or rebound tenderness.  There is no palpable liver or spleen enlargement.  Back exam shows no tenderness over the spine, ribs or hips.  Extremities shows some moderate nonpitting edema in the legs bilaterally.  He has bilateral knee replacement scars.  He has no shoulder issues.  There is no joint  swelling.  Skin exam shows no rashes, ecchymoses or petechia.  Neurological exam shows no focal neurological deficits.   '@IPVITALS' @   Recent Labs    12/30/19 1047  WBC 5.2  HGB 14.2  HCT 43.8  PLT 204   Recent Labs    12/30/19 1047  NA 141  K 4.1  CL 107  CO2 27  GLUCOSE 87  BUN 15  CREATININE 0.97  CALCIUM 9.4    Blood smear review: Normochromic and normocytic population of red blood cells.  There is no rouleaux formation.  There is no schistocytes or spherocytes.  I see no nucleated red blood cells.  White blood cells appear normal in morphology maturation.  He has good maturation of his myeloid cells.  There is no lymphoplasmacytic cells.  I see no blasts.  Platelets are adequate number and size.  Is a rare large platelet.  Platelets are well granulated.  Pathology: None    Assessment and Plan: Joshua Moran is a very nice 74 year old white male.  He has a minimal monoclonal spike.  I suspect that this probably is just on the basis of his age and chronic inflammatory conditions that he is dealt with.  I suppose he could have cardiac amyloid.  I do not see any manifestations of amyloidosis elsewhere.  I do not see anything that looks like amyloid with respect to his joints.  He does not have any diarrhea that would be consistent with GI deposition of amyloid.  He does not have any kind of bladder issues.  If he does have cardiac amyloid, I would suspect that he would haveTTR amyloid.  This is not from plasma cells.  I think this is a protein synthesized by the  liver.  The PYP Scan will look at the possibility of cardiac amyloid.  We will do a formal 24-hour urine on him.  If he does have light chains in the urine that are monoclonal, then we might have to do additional studies.  Ultimately, he might need a bone marrow biopsy.  He might need a rectal biopsy or possibly bladder biopsy.  He and his wife are very very nice.  I really enjoyed talking with them.  He is quite  interesting.  We will plan to see him back depending on what we find with his urine.  We will send off blood for him light chains.

## 2019-12-31 ENCOUNTER — Telehealth: Payer: Self-pay | Admitting: Hematology & Oncology

## 2019-12-31 ENCOUNTER — Encounter: Payer: Self-pay | Admitting: *Deleted

## 2019-12-31 LAB — IGG, IGA, IGM
IgA: 230 mg/dL (ref 61–437)
IgG (Immunoglobin G), Serum: 1169 mg/dL (ref 603–1613)
IgM (Immunoglobulin M), Srm: 92 mg/dL (ref 15–143)

## 2019-12-31 LAB — KAPPA/LAMBDA LIGHT CHAINS
Kappa free light chain: 28.7 mg/L — ABNORMAL HIGH (ref 3.3–19.4)
Kappa, lambda light chain ratio: 1.03 (ref 0.26–1.65)
Lambda free light chains: 27.9 mg/L — ABNORMAL HIGH (ref 5.7–26.3)

## 2019-12-31 NOTE — Progress Notes (Signed)
This navigator was out of the office during new patient appointment. Review of appointment show low likelihood of MM. Will follow results to determine if navigation is needed. No additional needs at this time.

## 2019-12-31 NOTE — Telephone Encounter (Signed)
No los 7/26 

## 2020-01-02 ENCOUNTER — Inpatient Hospital Stay: Payer: PPO

## 2020-01-02 ENCOUNTER — Other Ambulatory Visit: Payer: Self-pay

## 2020-01-02 DIAGNOSIS — D472 Monoclonal gammopathy: Secondary | ICD-10-CM | POA: Diagnosis not present

## 2020-01-03 LAB — PROTEIN ELECTROPHORESIS, SERUM, WITH REFLEX
A/G Ratio: 1.2 (ref 0.7–1.7)
Albumin ELP: 3.3 g/dL (ref 2.9–4.4)
Alpha-1-Globulin: 0.2 g/dL (ref 0.0–0.4)
Alpha-2-Globulin: 0.7 g/dL (ref 0.4–1.0)
Beta Globulin: 0.9 g/dL (ref 0.7–1.3)
Gamma Globulin: 1.2 g/dL (ref 0.4–1.8)
Globulin, Total: 2.8 g/dL (ref 2.2–3.9)
M-Spike, %: 0.4 g/dL — ABNORMAL HIGH
SPEP Interpretation: 0
Total Protein ELP: 6.1 g/dL (ref 6.0–8.5)

## 2020-01-03 LAB — IMMUNOFIXATION REFLEX, SERUM
IgA: 237 mg/dL (ref 61–437)
IgG (Immunoglobin G), Serum: 1282 mg/dL (ref 603–1613)
IgM (Immunoglobulin M), Srm: 102 mg/dL (ref 15–143)

## 2020-01-06 LAB — UPEP/UIFE/LIGHT CHAINS/TP, 24-HR UR
% BETA, Urine: 0 %
ALPHA 1 URINE: 0 %
Albumin, U: 0 %
Alpha 2, Urine: 0 %
Free Kappa Lt Chains,Ur: 1.55 mg/L (ref 0.63–113.79)
Free Kappa/Lambda Ratio: >2.31 (ref 1.03–31.76)
Free Lambda Lt Chains,Ur: <0.67 mg/L (ref 0.47–11.77)
GAMMA GLOBULIN URINE: 0 %
Total Protein, Urine-Ur/day: 162 mg/24 hr — ABNORMAL HIGH (ref 30–150)
Total Protein, Urine: 5.4 mg/dL
Total Volume: 3000

## 2020-01-06 NOTE — Telephone Encounter (Signed)
New orders for PYP scan have been placed. Will send message to [facility] to call and schedule.

## 2020-01-06 NOTE — Telephone Encounter (Signed)
Patient's wife calling back stating the patient was supposed to have a PYP done.

## 2020-01-07 ENCOUNTER — Encounter: Payer: Self-pay | Admitting: *Deleted

## 2020-01-07 ENCOUNTER — Telehealth (HOSPITAL_COMMUNITY): Payer: Self-pay | Admitting: *Deleted

## 2020-01-07 NOTE — Telephone Encounter (Signed)
Patient given detailed instructions per Amyloid Study Information Sheet for the test on 01/09/2020 at 1100. Patient notified to arrive 15 minutes early and that it is imperative to arrive on time for appointment to keep from having the test rescheduled.  If you need to cancel or reschedule your appointment, please call the office within 24 hours of your appointment. . Patient verbalized understanding.Vanesa Renier, Ranae Palms

## 2020-01-09 ENCOUNTER — Ambulatory Visit (HOSPITAL_COMMUNITY): Payer: PPO | Attending: Cardiology

## 2020-01-09 ENCOUNTER — Other Ambulatory Visit: Payer: Self-pay

## 2020-01-09 DIAGNOSIS — C9000 Multiple myeloma not having achieved remission: Secondary | ICD-10-CM | POA: Insufficient documentation

## 2020-01-09 MED ORDER — TECHNETIUM TC 99M PYROPHOSPHATE
21.4000 | Freq: Once | INTRAVENOUS | Status: AC
  Administered 2020-01-09: 21.4 via INTRAVENOUS

## 2020-01-13 ENCOUNTER — Telehealth: Payer: Self-pay

## 2020-01-13 ENCOUNTER — Encounter: Payer: Self-pay | Admitting: *Deleted

## 2020-01-13 DIAGNOSIS — E859 Amyloidosis, unspecified: Secondary | ICD-10-CM

## 2020-01-13 NOTE — Telephone Encounter (Signed)
-----   Message from Sueanne Margarita, MD sent at 01/09/2020  4:39 PM EDT ----- His PYP scan was equivocal.  Discussed with Dr. Gardiner Rhyme and ok to try cardiac MRI even with afib. Please order Cardiac MRI for amyloidosis. Please make sure tech knows patient has atrial fibrillation

## 2020-01-13 NOTE — Progress Notes (Signed)
Oncology Nurse Navigator Documentation  Oncology Nurse Navigator Flowsheets 01/13/2020  Abnormal Finding Date -  Diagnosis Status -  Navigator Follow Up Date: -  Navigator Follow Up Reason: -  Navigation Complete Date: 01/13/2020  Post Navigation: Continue to Follow Patient? No  Reason Not Navigating Patient: No Cancer Diagnosis  Navigator Location [facility]  Referral Date to RadOnc/MedOnc -  Navigator Encounter Type Appt/Treatment Plan Review  Treatment Phase -  Barriers/Navigation Needs -  Education -  Interventions -  Acuity -  Coordination of Care -  Education Method -  Time Spent with Patient 30

## 2020-01-13 NOTE — Telephone Encounter (Signed)
Cardiac MRI has been ordered.

## 2020-01-14 ENCOUNTER — Ambulatory Visit: Payer: PPO | Admitting: Physician Assistant

## 2020-01-14 ENCOUNTER — Other Ambulatory Visit: Payer: Self-pay

## 2020-01-14 ENCOUNTER — Encounter: Payer: Self-pay | Admitting: Physician Assistant

## 2020-01-14 ENCOUNTER — Telehealth: Payer: Self-pay

## 2020-01-14 ENCOUNTER — Encounter: Payer: Self-pay | Admitting: Cardiology

## 2020-01-14 ENCOUNTER — Telehealth: Payer: Self-pay | Admitting: Cardiology

## 2020-01-14 VITALS — BP 124/76 | HR 58 | Ht 72.0 in | Wt 285.2 lb

## 2020-01-14 DIAGNOSIS — I4819 Other persistent atrial fibrillation: Secondary | ICD-10-CM | POA: Diagnosis not present

## 2020-01-14 DIAGNOSIS — I428 Other cardiomyopathies: Secondary | ICD-10-CM | POA: Diagnosis not present

## 2020-01-14 DIAGNOSIS — I2510 Atherosclerotic heart disease of native coronary artery without angina pectoris: Secondary | ICD-10-CM | POA: Diagnosis not present

## 2020-01-14 DIAGNOSIS — I951 Orthostatic hypotension: Secondary | ICD-10-CM | POA: Diagnosis not present

## 2020-01-14 DIAGNOSIS — Z8673 Personal history of transient ischemic attack (TIA), and cerebral infarction without residual deficits: Secondary | ICD-10-CM | POA: Diagnosis not present

## 2020-01-14 DIAGNOSIS — G4733 Obstructive sleep apnea (adult) (pediatric): Secondary | ICD-10-CM | POA: Diagnosis not present

## 2020-01-14 DIAGNOSIS — E785 Hyperlipidemia, unspecified: Secondary | ICD-10-CM | POA: Diagnosis not present

## 2020-01-14 NOTE — Progress Notes (Signed)
Cardiology Office Note    Date:  01/14/2020   ID:  Joshua Manard., DOB 08/06/1945, MRN [id number]  PCP:  Lawerance Cruel, MD  Cardiologist: [name], MD EPS: None  No chief complaint on file.   History of Present Illness:  Joshua Fontan. is a 74 y.o. male with history of  of coronary calcifications of the LM and RCA, PAF, HLD, GERD and retired Teacher, English as a foreign language for [employer].  He has never smoked but has been followed by Dr. Gwenlyn Found in the past for [place] felt related to poor exercise tolerance from inactivity.  Stress myoview in 2016 was normal and 2D echo in 2019 showed normal LVF with G1DD and mild pulmonary HTN.  He saw Kerin Ransom in 07/2018 and was noted to be in afib by EKG but not started on DOAC as CHADS2VASC score he felt was 1.  Then saw Dr. Gwenlyn Found in 12/2018 for preop clearance for knee surgery and was still in afib and Dr. Gwenlyn Found stated that  His CHADS2VASC score was 1 but will increase to 2 in May 2021 when he turned 74yo. 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  He has recovered fully from his CVA.   He had a Chest CT done in 2016 showing 3v ASCAD. He has a hx of HLD on Repatha.  Diagnosed with OSA and started on CPAP. leximyoview Fixed defect at the apex/apical lateral/apical inferior walls, without evidence of ischemia. Similar to prior study. EF with global hypokinesis. No evidence of ischemia     Patient saw Dr. Radford Pax 11/25/19 who recommended cardioversion once he was on CPAP. PYP was equivocal and cardiac MRI ordered.Cardioversion planned once on CPAP.  Patient comes in for f/u. Trying to stay hydrated. Couldn't tolerate Proamatine-nausea and dry heave at night. wearing compression hose sometimes, not using abdominal binder. Dizziness has improved but still occasional dizziness. DOE with little activity.  They are frustrated with scheduling of CPAP and PYP at our office. He's tolerating CPAP and using regularly.  Many questions answered and test  reviewed.  Past Medical History:  Diagnosis Date  . Abnormal screening CT of chest    coronary calcium in left main and RCA  . Anemia   . Arthritis   . Complication of anesthesia    Pt reports difficult urinating  . Dyspnea    with some activity  . GERD (gastroesophageal reflux disease)    occasional  . Glaucoma   . Hyperlipidemia    statin intolerant at doses > Crestor 5mg  3 times weekly  . PAF (paroxysmal atrial fibrillation) (HCC)    CHADS2VASC score 2 on apixaban    Past Surgical History:  Procedure Laterality Date  . EYE SURGERY     bilateral cataracts  . KNEE ARTHROSCOPY W/ DEBRIDEMENT     left   . RETINAL DETACHMENT SURGERY Bilateral   . TOTAL KNEE ARTHROPLASTY Left 01/01/2018   Procedure: LEFT TOTAL KNEE ARTHROPLASTY;  Surgeon: Gaynelle Arabian, MD;  Location: [facility];  Service: Orthopedics;  Laterality: Left;  50 mins  . TOTAL KNEE ARTHROPLASTY Right 06/25/2018   Procedure: TOTAL KNEE ARTHROPLASTY;  Surgeon: Gaynelle Arabian, MD;  Location: [facility];  Service: Orthopedics;  Laterality: Right;  12min  . VASECTOMY      Current Medications: Current Meds  Medication Sig  . apixaban (ELIQUIS) 5 MG TABS tablet Take 1 tablet (5 mg total) by mouth 2 (two) times daily.  . brimonidine (ALPHAGAN)  0.15 % ophthalmic solution Place 1 drop into the right eye 2 (two) times daily.  . cetirizine (ZYRTEC) 10 MG tablet Take 10 mg by mouth every evening.  . cholecalciferol (VITAMIN D) 25 MCG (1000 UNIT) tablet Take 1,000 Units by mouth daily.  . Evolocumab (REPATHA SURECLICK) 379 MG/ML SOAJ Inject 1 pen into the skin every 14 (fourteen) days.  . ferrous sulfate 325 (65 FE) MG tablet Take 325 mg by mouth daily with breakfast.  . finasteride (PROSCAR) 5 MG tablet Take 5 mg by mouth daily.  . fluticasone (FLONASE) 50 MCG/ACT nasal spray Place 2 sprays into both nostrils 2 (two) times daily.   . Glucosamine 750 MG TABS Take 750 mg by mouth daily.  . metoprolol succinate (TOPROL-XL) 25 MG 24 hr  tablet Take 1 tablet by mouth as needed.  . Multiple Vitamins-Minerals (CENTRUM MEN PO) Take 1 tablet by mouth daily.  . rosuvastatin (CRESTOR) 5 MG tablet Take 5 mg by mouth 3 (three) times a week. M-W-F  . sildenafil (REVATIO) 20 MG tablet Take 40-100 mg by mouth daily as needed (erectile dysfunction.).   Marland Kitchen tamsulosin (FLOMAX) 0.4 MG CAPS capsule Take 0.4 mg by mouth daily.     Allergies:   Xarelto [rivaroxaban], Penicillins, Statins, and Vancomycin   Social History   Socioeconomic History  . Marital status: Married    Spouse name: Not on file  . Number of children: Not on file  . Years of education: Not on file  . Highest education level: Not on file  Occupational History  . Not on file  Tobacco Use  . Smoking status: Never Smoker  . Smokeless tobacco: Never Used  Vaping Use  . Vaping Use: Never used  Substance and Sexual Activity  . Alcohol use: Yes    Alcohol/week: 1.0 standard drink    Types: 1 Cans of beer per week    Comment: Rare  . Drug use: No  . Sexual activity: Not Currently  Other Topics Concern  . Not on file  Social History Narrative  . Not on file   Social Determinants of Health   Financial Resource Strain:   . Difficulty of Paying Living Expenses:   Food Insecurity:   . Worried About Charity fundraiser in the Last Year:   . Arboriculturist in the Last Year:   Transportation Needs:   . Film/video editor (Medical):   Marland Kitchen Lack of Transportation (Non-Medical):   Physical Activity:   . Days of Exercise per Week:   . Minutes of Exercise per Session:   Stress:   . Feeling of Stress :   Social Connections:   . Frequency of Communication with Friends and Family:   . Frequency of Social Gatherings with Friends and Family:   . Attends Religious Services:   . Active Member of Clubs or Organizations:   . Attends Archivist Meetings:   Marland Kitchen Marital Status:      Family History:  The patient's family history includes Lung cancer in his mother.     ROS:   Please see the history of present illness.    ROS All other systems reviewed and are negative.   PHYSICAL EXAM:   VS:  BP 124/76   Pulse (!) 58   Ht 6' (1.829 m)   Wt 285 lb 3.2 oz (129.4 kg)   SpO2 96%   BMI 38.68 kg/m   Physical Exam  GEN: Obese, in no acute distress  HEENT: normal  Neck: no JVD, carotid bruits, or masses Cardiac:irreg irreg no murmurs, rubs, or gallops  Respiratory:  clear to auscultation bilaterally, normal work of breathing GI: soft, nontender, nondistended, + BS Ext: without cyanosis, clubbing, or edema, Good distal pulses bilaterally MS: no deformity or atrophy  Skin: warm and dry, no rash Neuro:  Alert and Oriented x 3 Psych: euthymic mood, full affect  Wt Readings from Last 3 Encounters:  01/14/20 285 lb 3.2 oz (129.4 kg)  12/30/19 (!) 286 lb (129.7 kg)  11/25/19 283 lb (128.4 kg)      Studies/Labs Reviewed:   EKG:  EKG is ordered today.  The ekg ordered today demonstrates Atrial fibrillation at 84/m, poor R wave progression ant.  Recent Labs: 08/07/2019: Magnesium 2.0 12/30/2019: ALT 14; BUN 15; Creatinine 0.97; Hemoglobin 14.2; Platelet Count 204; Potassium 4.1; Sodium 141   Lipid Panel    Component Value Date/Time   CHOL 127 11/25/2019 1401   TRIG 94 11/25/2019 1401   HDL 49 11/25/2019 1401   CHOLHDL 2.6 11/25/2019 1401   CHOLHDL 3.7 08/07/2019 0614   VLDL 22 08/07/2019 0614   LDLCALC 60 11/25/2019 1401   LDLDIRECT 62 11/25/2019 1401    Additional studies/ records that were reviewed today include:  PYP 01/09/20 Study Highlights     The study is equivocal. The study is equivocal for TTR amyloidosis (visual score of 1 or H/CL ratio 1-1.5).   Candee Furbish, MD  Carlton Adam 09/2019 Study Highlights     The left ventricular ejection fraction is moderately decreased (30-44%).  Nuclear stress EF: 39%.  There was no ST segment deviation noted during stress.  This is an intermediate risk study.   Fixed defect at the  apex/apical lateral/apical inferior walls, without evidence of ischemia. Similar to prior study. EF with global hypokinesis. No evidence of ischemia.  Echo 08/2019 IMPRESSIONS     1. Left ventricular ejection fraction, by estimation, is 45 to 50%. The  left ventricle has mildly decreased function. The left ventricle  demonstrates global hypokinesis. There is mild left ventricular  hypertrophy. Left ventricular diastolic parameters  are indeterminate.   2. Right ventricular systolic function is mildly reduced. The right  ventricular size is normal. Tricuspid regurgitation signal is inadequate  for assessing PA pressure.   3. Left atrial size was mildly dilated.   4. The mitral valve is normal in structure and function. Trivial mitral  valve regurgitation. No evidence of mitral stenosis.   5. The aortic valve is tricuspid. Aortic valve regurgitation is not  visualized. Mild aortic valve sclerosis is present, with no evidence of  aortic valve stenosis.   6. The inferior vena cava is normal in size with greater than 50%  respiratory variability, suggesting right atrial pressure of 3 mmHg.   7. Technically difficult study with poor acoustic windows.    ASSESSMENT:    1. Persistent atrial fibrillation ([place])   2. Coronary artery disease involving native coronary artery of native heart without angina pectoris   3. NICM (nonischemic cardiomyopathy) ([place])   4. History of CVA (cerebrovascular accident)   5. Hyperlipidemia, unspecified hyperlipidemia type   6. Orthostatic hypotension   7. OSA (obstructive sleep apnea)      PLAN:  In order of problems listed above:  PAF on Eliquis-has not missed any doses.also on Toprol, rate control. Tolerating CPAP. Will schedule for DCCV.  Dilated Cardiomyopathy EF 45-50% on echo NST fixed defect, no ischemia.   CVA felt to be cardioembolic from Afib on lifetime  DOAC 10/2019  HLD LDL goal <70 on repatha and crestor  Orthostatic hypotension  improved with hydration. Not wearing abdominal binder and only wearing compression stockings occasionally. Because of orthostatic hypotension, DCM and Afib he is being worked up for amyloid. PYP equivical and now scheduled for MRI  Coronary art calcification with nonischemic lexiscan  OSA on CPAP-Nina reviewed his numbers and doing well on CPAP.    Medication Adjustments/Labs and Tests Ordered: Current medicines are reviewed at length with the patient today.  Concerns regarding medicines are outlined above.  Medication changes, Labs and Tests ordered today are listed in the Patient Instructions below. Patient Instructions  Medication Instructions:  Your physician recommends that you continue on your current medications as directed. Please refer to the Current Medication list given to you today.  *If you need a refill on your cardiac medications before your next appointment, please call your pharmacy*   Lab Work: TODAY: BMET, CBC  If you have labs (blood work) drawn today and your tests are completely normal, you will receive your results only by: Marland Kitchen MyChart Message (if you have MyChart) OR . A paper copy in the mail If you have any lab test that is abnormal or we need to change your treatment, we will call you to review the results.   Testing/Procedures: Your physician has recommended that you have a Cardioversion (DCCV). Electrical Cardioversion uses a jolt of electricity to your heart either through paddles or wired patches attached to your chest. This is a controlled, usually prescheduled, procedure. Defibrillation is done under light anesthesia in the hospital, and you usually go home the day of the procedure. This is done to get your heart back into a normal rhythm. You are not awake for the procedure. Please see the instruction sheet given to you today.     Follow-Up: At [facility], you and your health needs are our priority.  As part of our continuing mission to provide you  with exceptional heart care, we have created designated Provider Care Teams.  These Care Teams include your primary Cardiologist (physician) and Advanced Practice Providers (APPs -  Physician Assistants and Nurse Practitioners) who all work together to provide you with the care you need, when you need it.  We recommend signing up for the patient portal called "MyChart".  Sign up information is provided on this After Visit Summary.  MyChart is used to connect with patients for Virtual Visits (Telemedicine).  Patients are able to view lab/test results, encounter notes, upcoming appointments, etc.  Non-urgent messages can be sent to your provider as well.   To learn more about what you can do with MyChart, go to [email or web address].    Your next appointment:   01/28/2020  The format for your next appointment:   In Person  Provider:   [name], MD   Other Instructions  Dear Joshua Moran are scheduled for a TEE/Cardioversion/TEE Cardioversion on 01/21/2020 with Dr. Harrell Gave.  Please arrive at the [facility] (Main Entrance A) at [facility]: [address] [place], [place] [address] at 8:30 am/pm. (1 hour prior to procedure unless lab work is needed; if lab work is needed arrive 1.5 hours ahead)  DIET: Nothing to eat or drink after midnight except a sip of water with medications (see medication instructions below)  Medication Instructions:  Continue your anticoagulant: Eliquis You will need to continue your anticoagulant after your procedure until you are told by your Provider that it is safe to stop  You must have a responsible person to drive you home and stay in the waiting area during your procedure. Failure to do so could result in cancellation.  Bring your insurance cards.  *Special Note: Every effort is made to have your procedure done on time. Occasionally there are emergencies that occur at the hospital that may cause delays. Please be patient if a delay does  occur.      Signed, Ermalinda Barrios, PA-C  01/14/2020 2:42 PM    [facility] [address], [place], [place]  [address] Phone: [phone]; Fax: [phone]

## 2020-01-14 NOTE — Telephone Encounter (Signed)
Spoke with patient regarding the scheduling of the Cardiac MRI ordered by Dr. Ihor Gully states any weekday and time is ok---informed patient as soon as we hear from the insurance company regarding the prior authorization, I will be in touch with his appointment----he voiced his understanding.

## 2020-01-14 NOTE — Progress Notes (Signed)
Can you let patient know that Dr. Radford Pax wants to wait until he's 6 months out from his stroke before we do the cardioversion just to make sure everything is done safely. Please keep f/u with her in 2 weeks to reschedule in Sept. Thanks for understanding

## 2020-01-14 NOTE — Telephone Encounter (Signed)
error 

## 2020-01-14 NOTE — Telephone Encounter (Signed)
-----   Message from Imogene Burn, [place] sent at 01/14/2020  4:15 PM EDT -----   ----- Message ----- From: Sueanne Margarita, MD Sent: 01/14/2020   3:06 PM EDT To: Imogene Burn, PA-C  Lets wait another month until he is 6 months out  Traci ----- Message ----- From: Imogene Burn, PA-C Sent: 01/14/2020   2:42 PM EDT To: Sueanne Margarita, MD  Can you verify you are ok with this patient having a cardioversion next Tues? He had the CVA in May. You wanted him on CPAP and Gae Bon checked him and he's doing well. Thanks. Selinda Eon

## 2020-01-14 NOTE — Patient Instructions (Signed)
Medication Instructions:  Your physician recommends that you continue on your current medications as directed. Please refer to the Current Medication list given to you today.  *If you need a refill on your cardiac medications before your next appointment, please call your pharmacy*   Lab Work: TODAY: BMET, CBC  If you have labs (blood work) drawn today and your tests are completely normal, you will receive your results only by: Marland Kitchen MyChart Message (if you have MyChart) OR . A paper copy in the mail If you have any lab test that is abnormal or we need to change your treatment, we will call you to review the results.   Testing/Procedures: Your physician has recommended that you have a Cardioversion (DCCV). Electrical Cardioversion uses a jolt of electricity to your heart either through paddles or wired patches attached to your chest. This is a controlled, usually prescheduled, procedure. Defibrillation is done under light anesthesia in the hospital, and you usually go home the day of the procedure. This is done to get your heart back into a normal rhythm. You are not awake for the procedure. Please see the instruction sheet given to you today.     Follow-Up: At [facility], you and your health needs are our priority.  As part of our continuing mission to provide you with exceptional heart care, we have created designated Provider Care Teams.  These Care Teams include your primary Cardiologist (physician) and Advanced Practice Providers (APPs -  Physician Assistants and Nurse Practitioners) who all work together to provide you with the care you need, when you need it.  We recommend signing up for the patient portal called "MyChart".  Sign up information is provided on this After Visit Summary.  MyChart is used to connect with patients for Virtual Visits (Telemedicine).  Patients are able to view lab/test results, encounter notes, upcoming appointments, etc.  Non-urgent messages can be sent to  your provider as well.   To learn more about what you can do with MyChart, go to [email or web address].    Your next appointment:   01/28/2020  The format for your next appointment:   In Person  Provider:   [name], MD   Other Instructions  Dear [name] are scheduled for a TEE/Cardioversion/TEE Cardioversion on 01/21/2020 with Dr. Harrell Gave.  Please arrive at the [facility] (Main Entrance A) at [facility]: [address] [place], [place] [address] at 8:30 am/pm. (1 hour prior to procedure unless lab work is needed; if lab work is needed arrive 1.5 hours ahead)  DIET: Nothing to eat or drink after midnight except a sip of water with medications (see medication instructions below)  Medication Instructions:  Continue your anticoagulant: Eliquis You will need to continue your anticoagulant after your procedure until you are told by your Provider that it is safe to stop   You must have a responsible person to drive you home and stay in the waiting area during your procedure. Failure to do so could result in cancellation.  Bring your insurance cards.  *Special Note: Every effort is made to have your procedure done on time. Occasionally there are emergencies that occur at the hospital that may cause delays. Please be patient if a delay does occur.

## 2020-01-14 NOTE — Telephone Encounter (Signed)
Spoke to pt's Wife and explained this to her. I cancelled the Cardioversion and Covid test. She was very understanding and appreciated the call.

## 2020-01-15 ENCOUNTER — Ambulatory Visit: Payer: PPO | Admitting: Adult Health

## 2020-01-15 ENCOUNTER — Encounter: Payer: Self-pay | Admitting: Adult Health

## 2020-01-15 VITALS — BP 112/78 | HR 80 | Ht 71.0 in | Wt 287.0 lb

## 2020-01-15 DIAGNOSIS — I639 Cerebral infarction, unspecified: Secondary | ICD-10-CM | POA: Diagnosis not present

## 2020-01-15 DIAGNOSIS — E785 Hyperlipidemia, unspecified: Secondary | ICD-10-CM

## 2020-01-15 DIAGNOSIS — I4819 Other persistent atrial fibrillation: Secondary | ICD-10-CM | POA: Diagnosis not present

## 2020-01-15 DIAGNOSIS — G4733 Obstructive sleep apnea (adult) (pediatric): Secondary | ICD-10-CM | POA: Diagnosis not present

## 2020-01-15 DIAGNOSIS — Z9989 Dependence on other enabling machines and devices: Secondary | ICD-10-CM | POA: Diagnosis not present

## 2020-01-15 LAB — CBC
Hematocrit: 43.5 % (ref 37.5–51.0)
Hemoglobin: 14.3 g/dL (ref 13.0–17.7)
MCH: 30.7 pg (ref 26.6–33.0)
MCHC: 32.9 g/dL (ref 31.5–35.7)
MCV: 93 fL (ref 79–97)
Platelets: 227 10*3/uL (ref 150–450)
RBC: 4.66 x10E6/uL (ref 4.14–5.80)
RDW: 12.7 % (ref 11.6–15.4)
WBC: 6 10*3/uL (ref 3.4–10.8)

## 2020-01-15 LAB — BASIC METABOLIC PANEL
BUN/Creatinine Ratio: 16 (ref 10–24)
BUN: 14 mg/dL (ref 8–27)
CO2: 23 mmol/L (ref 20–29)
Calcium: 9 mg/dL (ref 8.6–10.2)
Chloride: 104 mmol/L (ref 96–106)
Creatinine, Ser: 0.85 mg/dL (ref 0.76–1.27)
GFR calc Af Amer: 99 mL/min/{1.73_m2} (ref >59)
GFR calc non Af Amer: 85 mL/min/{1.73_m2} (ref >59)
Glucose: 91 mg/dL (ref 65–99)
Potassium: 4.6 mmol/L (ref 3.5–5.2)
Sodium: 138 mmol/L (ref 134–144)

## 2020-01-15 NOTE — Progress Notes (Signed)
[facility] [address] [place]. [place] [address] [phone]       STROKE FOLLOW UP NOTE  [name]. Date of Birth:  Mar 21, 1946 Medical Record Number:  [id number]   Reason for Referral: stroke follow up    CHIEF COMPLAINT:  Chief Complaint  Patient presents with  . Follow-up    rm 9 here for a f/u on a stroke. Pt is having no new sx. Everything is the same. Pt is here with his wife Joshua Moran    HPI:  Today, 01/15/2020, Mr. [name] returns for stroke follow-up accompanied by his wife  He has been stable without residual stroke deficits and denies new or recurring stroke/TIA symptoms  He does report intermittent occipital headaches that have been present since his stroke and typically subside with use of Tylenol.  Headaches are not debilitating.  Denies visual changes or photophobia, phonophobia or nausea/vomiting  Remains on Eliquis for secondary stroke prevention history of A. fib without bleeding or bruising Initiating Repatha in addition to Crestor for HLD management with history of statin intolerance managed by cardiology.  Recent lipid panel 11/25/2019 showed LDL 60 Blood pressure today 112/78 Diagnosed with sleep apnea and reports ongoing use of CPAP  No concerns at this time     History provided for reference purposes only Update 09/11/2019 JM: Mr. [name] is being seen for hospital follow-up complaint by his wife.  He has recovered well from a stroke standpoint without residual deficits or new/reoccurring stroke/TIAs.  He continues on Eliquis without bleeding or bruising.  Continues on Crestor 5 mg 3 times weekly but recently referred to lipid clinic for potential initiation of PCSK9 inhibitor with history of statin intolerance.  Blood pressure today 102/64 and reports occasional episodes of lower blood pressure with dizziness and feels as though related to dehydration. Blood pressure at home typically 120-130/80s.  He also plans on undergoing  split-night study for possible sleep apnea at the end of this month.  Continues to follow with cardiology and PCP regularly for chronic disease management.  No concerns at this time.  Stroke admission 08/06/2019: [name]. is a 74 y.o. male with history of atrial fibrillation not on [facility] d/t low CHADSVASC score who presented on 08/06/2019 with recurrent aphasia.  Evaluated by stroke team and Dr. [name] with stroke work-up revealing left frontal lobe infarct embolic pattern in setting of known AF not on AC.  Recommend initiating Eliquis for secondary stroke prevention with CHA2DS2-VASc score 3 (prior 1).  LDL 106 previously on Crestor with history of multiple statin intolerances and recommended consideration of PCSK9 at outpatient follow-up for optimal cholesterol control.  No prior history of HTN or DM.  No prior history of stroke.  Discharged home in stable condition without therapy needs.  Stroke:   L frontal lobe infarct embolic in setting of known AF not on [facility]  Code Stroke CT head No acute abnormality. ASPECTS 10.     CTA head & neck no LVO, mild atherosclerosis head and neck. 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 Sinus dz  2D Echo ejection fraction 45 to 50%.  Mild left ventricular hypertrophy.  No cardiac source of embolism.    LDL 106  HgbA1c 5.8  Eliquis for VTE prophylaxis  aspirin 81 mg daily prior to admission, now on Eliquis (apixaban) daily. Continue Eliquis at d/c  Therapy recommendations: No PT OT needed   disposition: Home        ROS:   14 system review of systems performed and negative with exception of no complaints  PMH:  Past Medical History:  Diagnosis Date  . Abnormal screening CT of chest    coronary calcium in left main and RCA  . Anemia   . Arthritis   . Complication of anesthesia    Pt reports difficult urinating  . Dyspnea     with some activity  . GERD (gastroesophageal reflux disease)    occasional  . Glaucoma   . Hyperlipidemia    statin intolerant at doses > Crestor 5mg  3 times weekly  . PAF (paroxysmal atrial fibrillation) (HCC)    CHADS2VASC score 2 on apixaban    PSH:  Past Surgical History:  Procedure Laterality Date  . EYE SURGERY     bilateral cataracts  . KNEE ARTHROSCOPY W/ DEBRIDEMENT     left   . RETINAL DETACHMENT SURGERY Bilateral   . TOTAL KNEE ARTHROPLASTY Left 01/01/2018   Procedure: LEFT TOTAL KNEE ARTHROPLASTY;  Surgeon: Gaynelle Arabian, MD;  Location: [facility];  Service: Orthopedics;  Laterality: Left;  50 mins  . TOTAL KNEE ARTHROPLASTY Right 06/25/2018   Procedure: TOTAL KNEE ARTHROPLASTY;  Surgeon: Gaynelle Arabian, MD;  Location: [facility];  Service: Orthopedics;  Laterality: Right;  35min  . VASECTOMY      Social History:  Social History   Socioeconomic History  . Marital status: Married    Spouse name: Not on file  . Number of children: Not on file  . Years of education: Not on file  . Highest education level: Not on file  Occupational History  . Not on file  Tobacco Use  . Smoking status: Never Smoker  . Smokeless tobacco: Never Used  Vaping Use  . Vaping Use: Never used  Substance and Sexual Activity  . Alcohol use: Yes    Alcohol/week: 1.0 standard drink    Types: 1 Cans of beer per week    Comment: Rare  . Drug use: No  . Sexual activity: Not Currently  Other Topics Concern  . Not on file  Social History Narrative  . Not on file   Social Determinants of Health   Financial Resource Strain:   . Difficulty of Paying Living Expenses:   Food Insecurity:   . Worried About Charity fundraiser in the Last Year:   . Arboriculturist in the Last Year:   Transportation Needs:   . Film/video editor (Medical):   Marland Kitchen Lack of Transportation (Non-Medical):   Physical Activity:   . Days of Exercise per Week:   . Minutes of Exercise per Session:   Stress:   . Feeling  of Stress :   Social Connections:   . Frequency of Communication with Friends and Family:   . Frequency of Social Gatherings with Friends and Family:   . Attends Religious Services:   . Active Member of Clubs or Organizations:   . Attends Archivist Meetings:   Marland Kitchen Marital Status:   Intimate Partner Violence:   . Fear of Current or Ex-Partner:   . Emotionally Abused:   Marland Kitchen Physically Abused:   . Sexually Abused:     Family History:  Family History  Problem Relation Age of Onset  . Lung cancer Mother        smoker    Medications:   Current Outpatient Medications on File  Prior to Visit  Medication Sig Dispense Refill  . apixaban (ELIQUIS) 5 MG TABS tablet Take 1 tablet (5 mg total) by mouth 2 (two) times daily. 60 tablet 0  . brimonidine (ALPHAGAN) 0.15 % ophthalmic solution Place 1 drop into the right eye 2 (two) times daily.    . cetirizine (ZYRTEC) 10 MG tablet Take 10 mg by mouth every evening.    . cholecalciferol (VITAMIN D) 25 MCG (1000 UNIT) tablet Take 1,000 Units by mouth daily.    . Evolocumab (REPATHA SURECLICK) 094 MG/ML SOAJ Inject 1 pen into the skin every 14 (fourteen) days. 2 pen 11  . ferrous sulfate 325 (65 FE) MG tablet Take 325 mg by mouth daily with breakfast.    . finasteride (PROSCAR) 5 MG tablet Take 5 mg by mouth daily.    . fluticasone (FLONASE) 50 MCG/ACT nasal spray Place 2 sprays into both nostrils 2 (two) times daily.     . Glucosamine 750 MG TABS Take 750 mg by mouth daily.    . Multiple Vitamins-Minerals (CENTRUM MEN PO) Take 1 tablet by mouth daily.    . rosuvastatin (CRESTOR) 5 MG tablet Take 5 mg by mouth 3 (three) times a week. M-W-F    . sildenafil (REVATIO) 20 MG tablet Take 40-100 mg by mouth daily as needed (erectile dysfunction.).   5  . tamsulosin (FLOMAX) 0.4 MG CAPS capsule Take 0.4 mg by mouth daily.     No current facility-administered medications on file prior to visit.    Allergies:   Allergies  Allergen Reactions  .  Xarelto [Rivaroxaban] Rash    Patient denies allergy as of 08/07/19  . Penicillins Other (See Comments)    Very flushed  Has patient had a PCN reaction causing immediate rash, facial/tongue/throat swelling, SOB or lightheadedness with hypotension: No Has patient had a PCN reaction causing severe rash involving mucus membranes or skin necrosis: No Has patient had a PCN reaction that required hospitalization No Has patient had a PCN reaction occurring within the last 10 years: No If all of the above answers are "NO", then may proceed with Cephalosporin use.   . Statins Other (See Comments)    Pt c/o muscle aches with use of statins.  . Vancomycin Rash     Physical Exam  Vitals:   01/15/20 1229  BP: 112/78  Pulse: 80  Weight: 287 lb (130.2 kg)  Height: 5\' 11"  (1.803 m)   Body mass index is 40.03 kg/m. No exam data present  General: well developed, well nourished, pleasant elderly Caucasian male, seated, in no evident distress Head: head normocephalic and atraumatic.   Neck: supple with no carotid or supraclavicular bruits Cardiovascular: irregular rate and rhythm, no murmurs Musculoskeletal: no deformity Skin:  no rash/petichiae Vascular:  Normal pulses all extremities   Neurologic Exam Mental Status: Awake and fully alert. Normal speech and language. Oriented to place and time. Recent and remote memory intact. Attention span, concentration and fund of knowledge appropriate. Mood and affect appropriate.  Cranial Nerves: Pupils equal, briskly reactive to light. Extraocular movements full without nystagmus. Visual fields full to confrontation. Hearing intact. Facial sensation intact. Face, tongue, palate moves normally and symmetrically.  Motor: Normal bulk and tone. Normal strength in all tested extremity muscles. Sensory.: intact to touch , pinprick , position and vibratory sensation.  Coordination: Rapid alternating movements normal in all extremities. Finger-to-nose and  heel-to-shin performed accurately bilaterally. Gait and Station: Arises from chair without difficulty. Stance is normal. Gait demonstrates normal  stride length and balance Reflexes: 1+ and symmetric. Toes downgoing.      ASSESSMENT: Joshua Moran. is a 74 y.o. year old male presented with aphasia on 08/06/2019 with stroke work-up revealing left frontal lobe infarct embolic pattern secondary to known AF not on [facility] therefore Eliquis initiated. Vascular risk factors include HLD, OSA on CPAP and A. fib    PLAN:  1. Left frontal lobe stroke:  a. Recovered well without residual deficits b. Reports occasional occipital headache post stroke currently managed by Tylenol as needed with benefit.  Recommend continuing to monitor at this time but if headaches become more frequent to call office for possible need of scheduled medication management c. Continue Eliquis (apixaban) daily  and Crestor and Repatha.   d. Ensure close PCP and cardiology follow-up for aggressive stroke risk factor management 2. HLD:  a. LDL goal <70.  Recent LDL 60.   b. History of statin intolerance.   c. Continue Repatha and Crestor managed by cardiology  3. Atrial fibrillation:  a. Continuation of Eliquis and ongoing follow-up with cardiology for prescribing, monitoring and management 4. OSA on CPAP:  a. Reports ongoing compliance.   b. Managed by cardiology.    Follow up in 6 months or call earlier if needed   I spent 30 minutes of face-to-face and non-face-to-face time with patient and wife.  This included previsit chart review, lab review, study review, order entry, electronic health record documentation, patient education regarding recent stroke, atrial fibrillation on [facility], recent diagnosis of sleep apnea, discussion regarding headaches, importance of managing stroke risk factors and answered all questions to patient and wife satisfaction    Frann Rider, AGNP-BC  [facility] [address] [place], [place] [address]  Phone [phone] Fax [phone] Note: This document was prepared with digital dictation and possible smart phrase technology. Any transcriptional errors that result from this process are unintentional.

## 2020-01-15 NOTE — Addendum Note (Signed)
Addended by: Gar Ponto on: 01/15/2020 03:45 PM   Modules accepted: Orders

## 2020-01-15 NOTE — Patient Instructions (Signed)
Continue Eliquis (apixaban) daily  and Crestor and Repatha for secondary stroke prevention  Continue to follow with cardiology for atrial fibrillation and cholesterol monitoring and management  Ongoing nightly use of CPAP for sleep apnea management  Continue to follow up with PCP regarding blood pressure management  Maintain strict control of hypertension with blood pressure goal below 130/90 and cholesterol with LDL cholesterol (bad cholesterol) goal below 70 mg/dL.       Followup in the future with me in 6 months or call earlier if needed       Thank you for coming to see [place] at [facility]. I hope we have been able to provide you high quality care today.  You may receive a patient satisfaction survey over the next few weeks. We would appreciate your feedback and comments so that we may continue to improve ourselves and the health of our patients.

## 2020-01-16 DIAGNOSIS — L570 Actinic keratosis: Secondary | ICD-10-CM | POA: Diagnosis not present

## 2020-01-16 DIAGNOSIS — D229 Melanocytic nevi, unspecified: Secondary | ICD-10-CM | POA: Diagnosis not present

## 2020-01-16 DIAGNOSIS — L821 Other seborrheic keratosis: Secondary | ICD-10-CM | POA: Diagnosis not present

## 2020-01-16 DIAGNOSIS — D1801 Hemangioma of skin and subcutaneous tissue: Secondary | ICD-10-CM | POA: Diagnosis not present

## 2020-01-16 NOTE — Progress Notes (Signed)
I agree with the above plan 

## 2020-01-17 ENCOUNTER — Other Ambulatory Visit (HOSPITAL_COMMUNITY): Payer: PPO

## 2020-01-21 ENCOUNTER — Encounter (HOSPITAL_COMMUNITY): Payer: Self-pay

## 2020-01-21 ENCOUNTER — Ambulatory Visit (HOSPITAL_COMMUNITY): Admit: 2020-01-21 | Payer: PPO | Admitting: Cardiology

## 2020-01-21 ENCOUNTER — Encounter: Payer: Self-pay | Admitting: Cardiology

## 2020-01-21 SURGERY — CARDIOVERSION
Anesthesia: General

## 2020-01-21 NOTE — Telephone Encounter (Signed)
Spoke with patient regarding appointment scheduled 02/12/20 at 9:00 am for Cardiac MRI ordered by Dr. Radford Pax.  Arrival time is 8:30 am--1st floor admissions office.  Will mail information to patient and it is also in My Chart.  Patient voiced his understanding.

## 2020-01-27 DIAGNOSIS — N270 Small kidney, unilateral: Secondary | ICD-10-CM | POA: Diagnosis not present

## 2020-01-27 DIAGNOSIS — N5201 Erectile dysfunction due to arterial insufficiency: Secondary | ICD-10-CM | POA: Diagnosis not present

## 2020-01-27 DIAGNOSIS — R972 Elevated prostate specific antigen [PSA]: Secondary | ICD-10-CM | POA: Diagnosis not present

## 2020-01-27 DIAGNOSIS — N200 Calculus of kidney: Secondary | ICD-10-CM | POA: Diagnosis not present

## 2020-01-27 DIAGNOSIS — N401 Enlarged prostate with lower urinary tract symptoms: Secondary | ICD-10-CM | POA: Diagnosis not present

## 2020-01-27 DIAGNOSIS — G4733 Obstructive sleep apnea (adult) (pediatric): Secondary | ICD-10-CM | POA: Diagnosis not present

## 2020-01-27 DIAGNOSIS — R351 Nocturia: Secondary | ICD-10-CM | POA: Diagnosis not present

## 2020-01-28 ENCOUNTER — Ambulatory Visit: Payer: PPO | Admitting: Cardiology

## 2020-02-05 NOTE — Telephone Encounter (Signed)
Patient has a 10 week follow up appointment scheduled for 02/25/20. Patient understands he needs to keep this appointment for insurance compliance. Patient was grateful for the call and thanked me.

## 2020-02-05 NOTE — Telephone Encounter (Signed)
  Patient Consent for Virtual Visit         Joshua Moran. has provided verbal consent on 02/05/2020 for a virtual visit (video or telephone).   CONSENT FOR VIRTUAL VISIT FOR:  Joshua Moran.  By participating in this virtual visit I agree to the following:  I hereby voluntarily request, consent and authorize [facility] and its employed or contracted physicians, physician assistants, nurse practitioners or other licensed health care professionals (the Practitioner), to provide me with telemedicine health care services (the "Services") as deemed necessary by the treating Practitioner. I acknowledge and consent to receive the Services by the Practitioner via telemedicine. I understand that the telemedicine visit will involve communicating with the Practitioner through live audiovisual communication technology and the disclosure of certain medical information by electronic transmission. I acknowledge that I have been given the opportunity to request an in-person assessment or other available alternative prior to the telemedicine visit and am voluntarily participating in the telemedicine visit.  I understand that I have the right to withhold or withdraw my consent to the use of telemedicine in the course of my care at any time, without affecting my right to future care or treatment, and that the Practitioner or I may terminate the telemedicine visit at any time. I understand that I have the right to inspect all information obtained and/or recorded in the course of the telemedicine visit and may receive copies of available information for a reasonable fee.  I understand that some of the potential risks of receiving the Services via telemedicine include:  Marland Kitchen Delay or interruption in medical evaluation due to technological equipment failure or disruption; . Information transmitted may not be sufficient (e.g. poor resolution of images) to allow for appropriate medical decision making by the  Practitioner; and/or  . In rare instances, security protocols could fail, causing a breach of personal health information.  Furthermore, I acknowledge that it is my responsibility to provide information about my medical history, conditions and care that is complete and accurate to the best of my ability. I acknowledge that Practitioner's advice, recommendations, and/or decision may be based on factors not within their control, such as incomplete or inaccurate data provided by me or distortions of diagnostic images or specimens that may result from electronic transmissions. I understand that the practice of medicine is not an exact science and that Practitioner makes no warranties or guarantees regarding treatment outcomes. I acknowledge that a copy of this consent can be made available to me via my patient portal ([place]), or I can request a printed copy by calling the office of [facility].    I understand that my insurance will be billed for this visit.   I have read or had this consent read to me. . I understand the contents of this consent, which adequately explains the benefits and risks of the Services being provided via telemedicine.  . I have been provided ample opportunity to ask questions regarding this consent and the Services and have had my questions answered to my satisfaction. . I give my informed consent for the services to be provided through the use of telemedicine in my medical care

## 2020-02-07 DIAGNOSIS — H401113 Primary open-angle glaucoma, right eye, severe stage: Secondary | ICD-10-CM | POA: Diagnosis not present

## 2020-02-07 DIAGNOSIS — Z961 Presence of intraocular lens: Secondary | ICD-10-CM | POA: Diagnosis not present

## 2020-02-11 ENCOUNTER — Telehealth (HOSPITAL_COMMUNITY): Payer: Self-pay | Admitting: Emergency Medicine

## 2020-02-11 NOTE — Telephone Encounter (Signed)
Reaching out to patient to offer assistance regarding upcoming cardiac imaging study; pt verbalizes understanding of appt date/time, parking situation and where to check in,, and verified current allergies; name and call back number provided for further questions should they arise Joshua Bond RN Navigator Cardiac Imaging [facility] [phone] office [phone] cell   Pt denies implants, denies claustro

## 2020-02-12 ENCOUNTER — Ambulatory Visit (HOSPITAL_COMMUNITY)
Admission: RE | Admit: 2020-02-12 | Discharge: 2020-02-12 | Disposition: A | Discharge status: Home / Self Care | Payer: PPO | Source: Ambulatory Visit | Attending: Cardiology | Admitting: Cardiology

## 2020-02-12 ENCOUNTER — Other Ambulatory Visit: Payer: Self-pay

## 2020-02-12 DIAGNOSIS — E859 Amyloidosis, unspecified: Secondary | ICD-10-CM

## 2020-02-12 MED ORDER — GADOBUTROL 1 MMOL/ML IV SOLN
10.0000 mL | Freq: Once | INTRAVENOUS | Status: AC | PRN
  Administered 2020-02-12: 10 mL via INTRAVENOUS

## 2020-02-18 ENCOUNTER — Telehealth: Payer: Self-pay | Admitting: Cardiology

## 2020-02-18 ENCOUNTER — Telehealth: Payer: PPO | Admitting: Cardiology

## 2020-02-18 DIAGNOSIS — I4819 Other persistent atrial fibrillation: Secondary | ICD-10-CM

## 2020-02-18 DIAGNOSIS — I428 Other cardiomyopathies: Secondary | ICD-10-CM

## 2020-02-18 NOTE — Telephone Encounter (Signed)
[facility] is returning [place] call.

## 2020-02-18 NOTE — Telephone Encounter (Signed)
Pt's daughter called to get the pt's results. Please call back

## 2020-02-18 NOTE — Telephone Encounter (Signed)
"  Sueanne Margarita, MD  Antonieta Iba, RN Cardiac MRI showed mildly dilated LV with mildly thickened heart muscle and moderately reduced heart function. The right side of the heart is moderately decreased function. There is severe enlargement of the atrial. 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 This can cause LV dysfunction and also be associated with arrhythmias. Please get a 3 day Ziopatch. I would like him referred to AHF clinic as his orthostatic hypotension limits addition of HF meds."  The patient has been notified of the result and verbalized understanding.  All questions (if any) were answered. Antonieta Iba, RN 02/18/2020 10:10 AM  Zio patch has been ordered. Referral has been placed for AHF clinic

## 2020-02-18 NOTE — Telephone Encounter (Signed)
Spoke with the patient's daughter and reviewed results from patient's MRI. All questions (if any) were answered. Daughter verbalized understanding and thanked me for returning her call and explaining results.

## 2020-02-20 ENCOUNTER — Other Ambulatory Visit (INDEPENDENT_AMBULATORY_CARE_PROVIDER_SITE_OTHER): Payer: PPO

## 2020-02-20 DIAGNOSIS — I428 Other cardiomyopathies: Secondary | ICD-10-CM

## 2020-02-20 DIAGNOSIS — I4819 Other persistent atrial fibrillation: Secondary | ICD-10-CM | POA: Diagnosis not present

## 2020-02-25 ENCOUNTER — Encounter: Payer: Self-pay | Admitting: Cardiology

## 2020-02-25 ENCOUNTER — Other Ambulatory Visit: Payer: Self-pay

## 2020-02-25 ENCOUNTER — Ambulatory Visit: Payer: PPO | Admitting: Cardiology

## 2020-02-25 VITALS — BP 118/78 | HR 69 | Ht 72.0 in | Wt 290.4 lb

## 2020-02-25 DIAGNOSIS — G4733 Obstructive sleep apnea (adult) (pediatric): Secondary | ICD-10-CM | POA: Diagnosis not present

## 2020-02-25 DIAGNOSIS — I2510 Atherosclerotic heart disease of native coronary artery without angina pectoris: Secondary | ICD-10-CM | POA: Diagnosis not present

## 2020-02-25 DIAGNOSIS — E785 Hyperlipidemia, unspecified: Secondary | ICD-10-CM | POA: Diagnosis not present

## 2020-02-25 DIAGNOSIS — I639 Cerebral infarction, unspecified: Secondary | ICD-10-CM | POA: Diagnosis not present

## 2020-02-25 DIAGNOSIS — I951 Orthostatic hypotension: Secondary | ICD-10-CM

## 2020-02-25 DIAGNOSIS — I4819 Other persistent atrial fibrillation: Secondary | ICD-10-CM

## 2020-02-25 DIAGNOSIS — I428 Other cardiomyopathies: Secondary | ICD-10-CM

## 2020-02-25 DIAGNOSIS — I420 Dilated cardiomyopathy: Secondary | ICD-10-CM | POA: Insufficient documentation

## 2020-02-25 NOTE — Progress Notes (Signed)
Cardiology Office Note:    Date:  02/25/2020   ID:  Joshua Moran., DOB 1945-11-03, MRN [id number]  PCP:  Lawerance Cruel, MD  Cardiologist:  [name], MD    Referring MD: Lawerance Cruel, MD   Chief Complaint  Patient presents with  . Atrial Fibrillation  . Hyperlipidemia  . Sleep Apnea  . Cardiomyopathy    History of Present Illness:    Joshua Moran. is a 74 y.o. male with a hx of coronary calcifications of the LM and RCA, PAF, HLD, GERD and retired Teacher, English as a foreign language for [employer].  He has never smoked but has been followed by Dr. Gwenlyn Moran in the past for [place] felt related to poor exercise tolerance from inactivity.  Stress myoview in 2016 was normal and 2D echo in 2019 showed normal LVF with G1DD and mild pulmonary HTN.  He saw Kerin Ransom in 07/2018 and was noted to be in afib by EKG but not started on DOAC as CHADS2VASC score he felt was 1.  Then saw Dr. Gwenlyn Moran in 12/2018 for preop clearance for knee surgery and was still in afib and Dr. Gwenlyn Moran stated that  His CHADS2VASC score was 1 but will increase to 2 in May 2021 when he turned 74yo. 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  He has recovered fully from his CVA.   He had a Chest CT done in 2016 showing 3v ASCAD. He has a hx of HLD and has been on Crestor 5mg  3 times weekly but has been intolerant to any higher dose.    In followup he was remaining in atrial fibrillation.  It was felt that he should recover from CVA first and then consider DCCV.  Due to coronary artery calcifications on Chest CT, a Lexi myoview was done which showed Fixed defect at the apex/apical lateral/apical inferior walls, without evidence of ischemia. Similar to prior study. EF with global hypokinesis. No evidence of ischemia.  He also has some orthostatic hypotension and wears compression hose occasionally but has not been wearing the abdominal binder.   A sleep study was ordered which showed moderate OSA with an AHI of 26/hr  and was started on auto CPAP from 4-20cm H2O.    He is here today for followup and is doing well.  He denies any chest pain or pressure,  PND, orthopnea, palpitations or syncope.  He has chronic DOE and LE edema which are stable.  He also has some orthostatic hypotension and wears compression hose occasionally but has not been wearing the abdominal binder. He has some significant dizziness a few weeks ago when out running errands but he started drinking gatorade and his sx have improved over the past few weeks. He is compliant with his meds and is tolerating meds with no SE.    He is doing well with his CPAP device and thinks that he has gotten used to it.  He tolerates the mask and feels the pressure is adequate.  He goes to bed around 10:30-11pm and falls asleep around 11:30pm and gets up at from 8-9am.  He wakes up 4-5 times nightly to urinate.  Since going on CPAP he has not noticed much of an improvement in feeling rested in the am.  He denies any significant mouth or nasal dryness or nasal congestion.  He does not think that he snores.     Past Medical History:  Diagnosis Date  . Abnormal screening CT  of chest    coronary calcium in left main and RCA  . Anemia   . Arthritis   . Complication of anesthesia    Pt reports difficult urinating  . Coronary artery calcification seen on CAT scan    no ischemic on nuclear stress test 09/2019  . 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  Cardiac MRI 2021 with EF 39% and moderate RV dysfunction and non-compaction  . Dyspnea    with some activity  . GERD (gastroesophageal reflux disease)    occasional  . Glaucoma   . Hyperlipidemia    statin intolerant at doses > Crestor 5mg  3 times weekly  . OSA on CPAP   . PAF (paroxysmal atrial fibrillation) ([facility])    CHADS2VASC score 2 on apixaban    Past Surgical History:  Procedure Laterality Date  . EYE SURGERY     bilateral cataracts  . KNEE ARTHROSCOPY W/ DEBRIDEMENT      left   . RETINAL DETACHMENT SURGERY Bilateral   . TOTAL KNEE ARTHROPLASTY Left 01/01/2018   Procedure: LEFT TOTAL KNEE ARTHROPLASTY;  Surgeon: Gaynelle Arabian, MD;  Location: [facility];  Service: Orthopedics;  Laterality: Left;  50 mins  . TOTAL KNEE ARTHROPLASTY Right 06/25/2018   Procedure: TOTAL KNEE ARTHROPLASTY;  Surgeon: Gaynelle Arabian, MD;  Location: [facility];  Service: Orthopedics;  Laterality: Right;  19min  . VASECTOMY      Current Medications: Current Meds  Medication Sig  . apixaban (ELIQUIS) 5 MG TABS tablet Take 1 tablet (5 mg total) by mouth 2 (two) times daily.  . brimonidine (ALPHAGAN) 0.15 % ophthalmic solution Place 1 drop into the right eye 2 (two) times daily.  . cetirizine (ZYRTEC) 10 MG tablet Take 10 mg by mouth every evening.  . cholecalciferol (VITAMIN D) 25 MCG (1000 UNIT) tablet Take 1,000 Units by mouth daily.  . Evolocumab (REPATHA SURECLICK) 096 MG/ML SOAJ Inject 1 pen into the skin every 14 (fourteen) days.  . ferrous sulfate 325 (65 FE) MG tablet Take 325 mg by mouth daily with breakfast.  . finasteride (PROSCAR) 5 MG tablet Take 5 mg by mouth daily.  . fluticasone (FLONASE) 50 MCG/ACT nasal spray Place 2 sprays into both nostrils 2 (two) times daily.   . Glucosamine 750 MG TABS Take 750 mg by mouth daily.  . Multiple Vitamins-Minerals (CENTRUM MEN PO) Take 1 tablet by mouth daily.  . rosuvastatin (CRESTOR) 5 MG tablet Take 5 mg by mouth 3 (three) times a week. M-W-F  . sildenafil (REVATIO) 20 MG tablet Take 40-100 mg by mouth daily as needed (erectile dysfunction.).   Marland Kitchen tamsulosin (FLOMAX) 0.4 MG CAPS capsule Take 0.4 mg by mouth daily.     Allergies:   Xarelto [rivaroxaban], Penicillins, Statins, and Vancomycin   Social History   Socioeconomic History  . Marital status: Married    Spouse name: Not on file  . Number of children: Not on file  . Years of education: Not on file  . Highest education level: Not on file  Occupational History  . Not on file   Tobacco Use  . Smoking status: Never Smoker  . Smokeless tobacco: Never Used  Vaping Use  . Vaping Use: Never used  Substance and Sexual Activity  . Alcohol use: Yes    Alcohol/week: 1.0 standard drink    Types: 1 Cans of beer per week    Comment: Rare  . Drug use: No  . Sexual activity: Not Currently  Other Topics Concern  . Not on file  Social History Narrative  . Not on file   Social Determinants of Health   Financial Resource Strain:   . Difficulty of Paying Living Expenses: Not on file  Food Insecurity:   . Worried About Charity fundraiser in the Last Year: Not on file  . Ran Out of Food in the Last Year: Not on file  Transportation Needs:   . Lack of Transportation (Medical): Not on file  . Lack of Transportation (Non-Medical): Not on file  Physical Activity:   . Days of Exercise per Week: Not on file  . Minutes of Exercise per Session: Not on file  Stress:   . Feeling of Stress : Not on file  Social Connections:   . Frequency of Communication with Friends and Family: Not on file  . Frequency of Social Gatherings with Friends and Family: Not on file  . Attends Religious Services: Not on file  . Active Member of Clubs or Organizations: Not on file  . Attends Archivist Meetings: Not on file  . Marital Status: Not on file     Family History: The patient's family history includes Lung cancer in his mother.  ROS:   Please see the history of present illness.    ROS  All other systems reviewed and negative.   EKGs/Labs/Other Studies Reviewed:    The following studies were reviewed today: Sleep study, labs  EKG:  EKG is not ordered today.    Recent Labs: 08/07/2019: Magnesium 2.0 12/30/2019: ALT 14 01/14/2020: BUN 14; Creatinine, Ser 0.85; Hemoglobin 14.3; Platelets 227; Potassium 4.6; Sodium 138   Recent Lipid Panel    Component Value Date/Time   CHOL 127 11/25/2019 1401   TRIG 94 11/25/2019 1401   HDL 49 11/25/2019 1401   CHOLHDL 2.6  11/25/2019 1401   CHOLHDL 3.7 08/07/2019 0614   VLDL 22 08/07/2019 0614   LDLCALC 60 11/25/2019 1401   LDLDIRECT 62 11/25/2019 1401    Physical Exam:    VS:  BP 118/78   Pulse 69   Ht 6' (1.829 m)   Wt 290 lb 6.4 oz (131.7 kg)   SpO2 97%   BMI 39.39 kg/m     Wt Readings from Last 3 Encounters:  02/25/20 290 lb 6.4 oz (131.7 kg)  01/15/20 287 lb (130.2 kg)  01/14/20 285 lb 3.2 oz (129.4 kg)     GEN:  Well nourished, well developed in no acute distress HEENT: Normal NECK: No JVD; No carotid bruits LYMPHATICS: No lymphadenopathy CARDIAC: RRR, no murmurs, rubs, gallops RESPIRATORY:  Clear to auscultation without rales, wheezing or rhonchi  ABDOMEN: Soft, non-tender, non-distended MUSCULOSKELETAL:  No edema; No deformity  SKIN: Warm and dry NEUROLOGIC:  Alert and oriented x 3 PSYCHIATRIC:  Normal affect   ASSESSMENT:    1. Persistent atrial fibrillation ([place])   2. Dyslipidemia, goal LDL below 100   3. Coronary artery calcification seen on CAT scan   4. Cerebrovascular accident (CVA), unspecified mechanism ([place])   5. OSA (obstructive sleep apnea)   6. Nonischemic cardiomyopathy ([place])   7. Orthostatic hypotension    PLAN:    In order of problems listed above:  1.  Persistent atrial fibrillation -remains in atrial fibrillation but rate controlled on the 80's -denies any bleeding problems on apixaban and no palpitations -continue Toprol XL 25mg  daily and apixaban 5mg  BID (CHADS2VASC score is 71 - age >82 in a month and CAD on Chest CT) -  he is now out 6 months from CVA so would like to pursue DCCV to see if SOB improves -he missed a dose of Eliquis a week ago.  -I will set him up in 3 weeks for DCCV  2.  HLD -LDL goal < 70 due to coronary calcifications -last LDL was 62 in June 2021 -continue Crestor 5mg  3 times weekly and Repatha  3.  Coronary artery calcifications -Chest CT in 2016 showed 3 vessel coronary artery calcifications -EF 45-50% on echo earlier this  month -cannot get coronary calcium score due to afib -repeat nuclear stress test with no ischemia 09/2019 -no ASA due to DOAC -continue statin -denies any anginal symptoms  4.  CVA -felt cardioembolic from afib -now on lifetime DOAC  5.  OSA -  The patient is tolerating PAP therapy well without any problems. The PAP download was reviewed today and showed an AHI of 2.4/hr on auto CPAP  with 83% compliance in using more than 4 hours nightly.  The patient has been using and benefiting from PAP use and will continue to benefit from therapy.   6.  DCM -cMRI c/w non-ischemic CM with non-compaction -noted to have coronary calcifications on Chest CT but no ischemia on stress test -cardiac MRI with EF 39% and mild LVH with diffuse HK and no LGE -he just handed his heart monitor in and results are pending -I think he would benefit from restoring NSR which should help improvement in BP as well. -Will arrange DCCV -he has a referral in place for AHF clinic for guidance with biventricular failure as he also has problems with orthostatic hypotension.    7.  Orthostatic hypotension -he has not been compliant with using the Abdominal binder -I have encouraged him to use his compression hose and abd binder daily when out standing for prolonged periods of time   Medication Adjustments/Labs and Tests Ordered: Current medicines are reviewed at length with the patient today.  Concerns regarding medicines are outlined above.  No orders of the defined types were placed in this encounter.  No orders of the defined types were placed in this encounter.   Signed, [name], MD  02/25/2020 12:17 PM    [place]

## 2020-02-25 NOTE — Addendum Note (Signed)
Addended by: Antonieta Iba on: 02/25/2020 12:31 PM   Modules accepted: Orders

## 2020-02-25 NOTE — Patient Instructions (Signed)
Medication Instructions:  Your physician recommends that you continue on your current medications as directed. Please refer to the Current Medication list given to you today.  *If you need a refill on your cardiac medications before your next appointment, please call your pharmacy*   Lab Work: CBC and BMET If you have labs (blood work) drawn today and your tests are completely normal, you will receive your results only by:  [place] (if you have MyChart) OR  A paper copy in the mail If you have any lab test that is abnormal or we need to change your treatment, we will call you to review the results.   Testing/Procedures: Your physician has recommended that you have a Cardioversion (DCCV). Electrical Cardioversion uses a jolt of electricity to your heart either through paddles or wired patches attached to your chest. This is a controlled, usually prescheduled, procedure. Defibrillation is done under light anesthesia in the hospital, and you usually go home the day of the procedure. This is done to get your heart back into a normal rhythm. You are not awake for the procedure. Please see the instruction sheet given to you today.  Follow-Up: At [facility], you and your health needs are our priority.  As part of our continuing mission to provide you with exceptional heart care, we have created designated Provider Care Teams.  These Care Teams include your primary Cardiologist (physician) and Advanced Practice Providers (APPs -  Physician Assistants and Nurse Practitioners) who all work together to provide you with the care you need, when you need it.  Your next appointment:   6 month(s)  The format for your next appointment:   In Person  Provider:   You may see [name], MD or one of the following Advanced Practice Providers on your designated Care Team:    Melina Copa, PA-C  Ermalinda Barrios, PA-C   Other Instructions You are scheduled for a Cardioversion with Dr. Gardiner Rhyme  on Wednesday, October 13th.  Please arrive at the [facility] (Main Entrance A) at [facility]: [address] [place],  [address] at 8 am. (1 hour prior to procedure unless lab work is needed; if lab work is needed arrive 1.5 hours ahead)  DIET: Nothing to eat or drink after midnight except a sip of water with medications (see medication instructions below)  Medication Instructions:   Continue your anticoagulant: Eliquis You will need to continue your anticoagulant after your procedure until you are told by your  Provider that it is safe to stop   Labs:  On Monday, October 11th, 2021 come to the lab at [facility] between the hours of 8:00 am and 4:30 pm. You do not have to be fasting.  COVID Screening: On Monday, October 11th, 2021 come to [address], [place] after having labs drawn. You must quarantine after screening until your procedure.    You must have a responsible person to drive you home and stay in the waiting area during your procedure. Failure to do so could result in cancellation.  Bring your insurance cards.  *Special Note: Every effort is made to have your procedure done on time. Occasionally there are emergencies that occur at the hospital that may cause delays. Please be patient if a delay does occur.

## 2020-02-27 DIAGNOSIS — I4819 Other persistent atrial fibrillation: Secondary | ICD-10-CM | POA: Diagnosis not present

## 2020-02-27 DIAGNOSIS — G4733 Obstructive sleep apnea (adult) (pediatric): Secondary | ICD-10-CM | POA: Diagnosis not present

## 2020-02-27 DIAGNOSIS — I428 Other cardiomyopathies: Secondary | ICD-10-CM | POA: Diagnosis not present

## 2020-03-03 DIAGNOSIS — N401 Enlarged prostate with lower urinary tract symptoms: Secondary | ICD-10-CM | POA: Diagnosis not present

## 2020-03-03 DIAGNOSIS — I4891 Unspecified atrial fibrillation: Secondary | ICD-10-CM | POA: Diagnosis not present

## 2020-03-03 DIAGNOSIS — D649 Anemia, unspecified: Secondary | ICD-10-CM | POA: Diagnosis not present

## 2020-03-03 DIAGNOSIS — M179 Osteoarthritis of knee, unspecified: Secondary | ICD-10-CM | POA: Diagnosis not present

## 2020-03-03 DIAGNOSIS — E7800 Pure hypercholesterolemia, unspecified: Secondary | ICD-10-CM | POA: Diagnosis not present

## 2020-03-03 DIAGNOSIS — I639 Cerebral infarction, unspecified: Secondary | ICD-10-CM | POA: Diagnosis not present

## 2020-03-04 ENCOUNTER — Telehealth: Payer: Self-pay

## 2020-03-04 DIAGNOSIS — I4819 Other persistent atrial fibrillation: Secondary | ICD-10-CM

## 2020-03-04 DIAGNOSIS — I4729 Other ventricular tachycardia: Secondary | ICD-10-CM

## 2020-03-04 NOTE — Telephone Encounter (Signed)
-----   Message from Sueanne Margarita, MD sent at 03/04/2020  1:38 PM EDT ----- Heart monitor showed atrial fibrillation that is rate controlled but now having nonsustained ventricular tachycardia - please get in to see EP ASAP before he has his DCCV

## 2020-03-04 NOTE — Telephone Encounter (Signed)
The patient has been notified of the result and verbalized understanding.  All questions (if any) were answered. Antonieta Iba, RN 03/04/2020 5:40 PM  Urgent referral for EP has been placed and scheduler notified.

## 2020-03-09 NOTE — H&P (View-Only) (Signed)
Electrophysiology Office Note:    Date:  03/10/2020   ID:  Joshua Moran., DOB Nov 03, 1945, MRN [id number]  PCP:  Lawerance Cruel, MD  [facility] Cardiologist:  [name], MD  [facility] Electrophysiologist:  None   Referring MD: Sueanne Margarita, MD   Chief Complaint: AF  History of Present Illness:    Joshua Moran. is a 74 y.o. male who presents for an evaluation of atrial fibrillation, ventricular tachycardia and dilated cardiomyopathy at the request of Dr. Radford Pax. Their medical history includes dilated cardiomyopathy with a recent ejection fraction of 39% by cardiac MRI, CVA, GERD, OSA on CPAP, hyperlipidemia and anemia.  For his atrial fibrillation, he is on Eliquis 5 mg twice daily and Toprol XL 25 mg daily.  He is tolerating the anticoagulant.  He recently wore a ZIO monitor which showed persistent atrial fibrillation and possible nonsustained VT for which he is referred for evaluation.  He tells me is active but is not limited by his atrial fibrillation has with any amount of exertion his heart rate increases rapidly to the 140-150 range.  When his heart rate is as fast he sometimes feels lightheaded.  He has a long history of orthostatic intolerance as well which is atrial fibrillation is likely contributing to.  He uses compression stockings that are knee-high but he is unable to tell if these are truly helping his symptoms.  He does have one episode of syncope that occurred proximally 3 months ago.  He tells me that he stood up from a seated position and started to feel lightheaded he was walking and tried to get back somewhere to safely sit down when he passed out.      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  Cardiac MRI 2021 with EF 39% and moderate RV dysfunction and non-compaction   Dyspnea    with some activity   GERD (gastroesophageal reflux disease)    occasional   Glaucoma    Hyperlipidemia    statin intolerant at doses > Crestor 7m 3 times weekly   OSA on CPAP    PAF (paroxysmal atrial fibrillation) (HCC)    CHADS2VASC score 2 on apixaban    Past Surgical History:  Procedure Laterality Date   EYE SURGERY     bilateral cataracts   KNEE ARTHROSCOPY W/ DEBRIDEMENT     left    RETINAL DETACHMENT SURGERY Bilateral    TOTAL KNEE ARTHROPLASTY Left 01/01/2018   Procedure: LEFT TOTAL KNEE ARTHROPLASTY;  Surgeon: AGaynelle Arabian MD;  Location: [facility];  Service: Orthopedics;  Laterality: Left;  50 mins   TOTAL KNEE ARTHROPLASTY Right 06/25/2018   Procedure: TOTAL KNEE ARTHROPLASTY;  Surgeon: AGaynelle Arabian MD;  Location: [facility];  Service: Orthopedics;  Laterality: Right;  579m   VASECTOMY      Current Medications: Current Meds  Medication Sig   apixaban (ELIQUIS) 5 MG TABS tablet Take 1 tablet (5 mg total) by mouth 2 (two) times daily.   brimonidine (ALPHAGAN) 0.15 % ophthalmic solution Place 1 drop into the right eye 2 (two) times  daily.   cetirizine (ZYRTEC) 10 MG tablet Take 10 mg by mouth every evening.   Cholecalciferol (VITAMIN D3) 50 MCG (2000 UT) TABS Take 2,000 Units by mouth at bedtime.    Evolocumab (REPATHA SURECLICK) 409 MG/ML SOAJ Inject 1 pen into the skin every 14 (fourteen) days. (Patient taking differently: Inject 140 mg into the skin every 14 (fourteen) days. )   ferrous sulfate 325 (65 FE) MG tablet Take 325 mg by mouth daily with breakfast.   finasteride (PROSCAR) 5 MG tablet Take 5 mg by mouth daily.   fluticasone (FLONASE) 50 MCG/ACT nasal spray Place 2 sprays into both nostrils 2 (two) times daily.    Glucosamine 750 MG TABS Take 750 mg by mouth in the  morning and at bedtime.    Multiple Vitamins-Minerals (CENTRUM MEN PO) Take 1 tablet by mouth daily.   rosuvastatin (CRESTOR) 5 MG tablet Take 5 mg by mouth 3 (three) times a week. M-W-F   sildenafil (REVATIO) 20 MG tablet Take 40-100 mg by mouth daily as needed (erectile dysfunction.).    tamsulosin (FLOMAX) 0.4 MG CAPS capsule Take 0.4 mg by mouth at bedtime.      Allergies:   Xarelto [rivaroxaban], Penicillins, Statins, and Vancomycin   Social History   Socioeconomic History   Marital status: Married    Spouse name: Not on file   Number of children: Not on file   Years of education: Not on file   Highest education level: Not on file  Occupational History   Not on file  Tobacco Use   Smoking status: Never Smoker   Smokeless tobacco: Never Used  Vaping Use   Vaping Use: Never used  Substance and Sexual Activity   Alcohol use: Yes    Alcohol/week: 1.0 standard drink    Types: 1 Cans of beer per week    Comment: Rare   Drug use: No   Sexual activity: Not Currently  Other Topics Concern   Not on file  Social History Narrative   Not on file   Social Determinants of Health   Financial Resource Strain:    Difficulty of Paying Living Expenses: Not on file  Food Insecurity:    Worried About [place] in the Last Year: Not on file   Ran Out of Food in the Last Year: Not on file  Transportation Needs:    Lack of Transportation (Medical): Not on file   Lack of Transportation (Non-Medical): Not on file  Physical Activity:    Days of Exercise per Week: Not on file   Minutes of Exercise per Session: Not on file  Stress:    Feeling of Stress : Not on file  Social Connections:    Frequency of Communication with Friends and Family: Not on file   Frequency of Social Gatherings with Friends and Family: Not on file   Attends Religious Services: Not on file   Active Member of Clubs or Organizations: Not on file   Attends Archivist  Meetings: Not on file   Marital Status: Not on file     Family History: The patient's family history includes Lung cancer in his mother.  ROS:   Please see the history of present illness.    All other systems reviewed and are negative.  EKGs/Labs/Other Studies Reviewed:    The following studies were reviewed today: cMR  02/12/2020 cMR 1. Mildly dilated left ventricle with mild concentric hypertrophy and moderately reduced systolic function (LVEF = 39%). There is  diffuse hypokinesis and no late gadolinium enhancement in the left ventricular myocardium. There are prominent trabeculations in the mid and apical segments with the ratio of non-compacted to compacted myocardium 4:1. 2. Mildly dilated right ventricle with moderately decreased systolic function (RVEF = 28%) and no regional wall motion abnormalities. 3. Severely dilated left atrium and moderately dilated right atrial size. 4. Normal size of the aortic root, ascending aorta and pulmonary artery. 5.  Mild mitral and tricuspid regurgitation. 6.  Normal pericardium.  Trivial pericardial effusion.  02/28/2020 Holter personally reviewed AF with ventricular rates 88-200.  100% burden. Most of the events labeled and SVT occurred in the setting of A. fib with rapid ventricular rates.  The average ventricular rate prior to the onset of the wide-complex tachycardia is almost exactly the same rate as the wide-complex tachycardia raising the question of a rate related aberrancy within the right bundle.  Given these episodes only occurred in the setting of RVR, my opinion is that the most likely represent aberrant conduction.  There are other areas within the ZIO report of 1-5 beats of a wide-complex rhythm which is more convincingly ventricular in origin. Rare PVCs  EKGs from March 2021, February 2020, November 2019 all show atrial fibrillation.  EKG from December 13, 2017 shows sinus rhythm.  This is the last time that I see  documentation of the patient being in normal rhythm.  EKG:  The ekg ordered today demonstrates atrial fibrillation with a ventricular rate of 83 bpm.  Recent Labs: 08/07/2019: Magnesium 2.0 12/30/2019: ALT 14 01/14/2020: BUN 14; Creatinine, Ser 0.85; Hemoglobin 14.3; Platelets 227; Potassium 4.6; Sodium 138  Recent Lipid Panel    Component Value Date/Time   CHOL 127 11/25/2019 1401   TRIG 94 11/25/2019 1401   HDL 49 11/25/2019 1401   CHOLHDL 2.6 11/25/2019 1401   CHOLHDL 3.7 08/07/2019 0614   VLDL 22 08/07/2019 0614   LDLCALC 60 11/25/2019 1401   LDLDIRECT 62 11/25/2019 1401    Physical Exam:    VS:  BP 126/68    Pulse 83    Ht 6' (1.829 m)    Wt 291 lb 6.4 oz (132.2 kg)    SpO2 96%    BMI 39.52 kg/m     Wt Readings from Last 3 Encounters:  03/10/20 291 lb 6.4 oz (132.2 kg)  02/25/20 290 lb 6.4 oz (131.7 kg)  01/15/20 287 lb (130.2 kg)     GEN:  Well nourished, well developed in no acute distress.  Obese. HEENT: Normal NECK: No JVD; No carotid bruits LYMPHATICS: No lymphadenopathy CARDIAC: Irregularly irregular, no murmurs, rubs, gallops.  Faint heart sounds. RESPIRATORY:  Clear to auscultation without rales, wheezing or rhonchi  ABDOMEN: Soft, non-tender, non-distended MUSCULOSKELETAL:  No edema; No deformity  SKIN: Warm and dry NEUROLOGIC:  Alert and oriented x 3 PSYCHIATRIC:  Normal affect   ASSESSMENT:    1. Longstanding persistent atrial fibrillation ([place])   2. NICM (nonischemic cardiomyopathy) ([place])   3. Cerebrovascular accident (CVA), unspecified mechanism ([place])   4. OSA (obstructive sleep apnea)    PLAN:    In order of problems listed above:  1. Longstanding persistent atrial fibrillation CHA2DS2-VASc of 6 for CHF, prior stroke, age and coronary disease.  On Eliquis. He has been in atrial fibrillation continuously for over 2 years.  Recent MRI shows a severely dilated left atrium.  I am concerned that given the chronicity of his atrial fibrillation and  left atrial anatomy, restoring sinus rhythm may be  short-lived without antiarrhythmic therapy.  He has normal renal function but has structural heart disease and coronary artery disease that limits his antiarrhythmic therapy options.  I have discussed the options in including continuing the current plan to cardiovert without antiarrhythmic versus starting an antiarrhythmic and delaying his cardioversion for a couple weeks in an attempt to maximize the chances of success.  I think the best option for an antiarrhythmic is amiodarone given his set of comorbidities.  I discussed the risks of amiodarone and the importance of continued close follow-up for monitoring of his liver, thyroid, and lung function.  We will start 400 mg of amiodarone twice daily for 10 days and then decrease the dose to 200 mg once daily.  We will plan to cardiovert 3 weeks from today.  I will plan to see him back in clinic in 3 months.  If we are unable to achieve restoration of normal sinus rhythm, we will stop the amiodarone and assume that he is in permanent atrial fibrillation.  2.  Nonischemic cardiomyopathy MRI raises the question of a noncompaction phenotype.  Likely, his left ventricular dysfunction is multifactorial from atrial fibrillation and possible noncompaction.  3.  Cardioembolic stroke Continue Eliquis.  4.  Obstructive sleep apnea on CPAP Recommended he continue CPAP therapy  Medication Adjustments/Labs and Tests Ordered: Current medicines are reviewed at length with the patient today.  Concerns regarding medicines are outlined above.  Orders Placed This Encounter  Procedures   Comp Met (CMET)   TSH   EKG 12-Lead   No orders of the defined types were placed in this encounter.    Signed, Lars Mage, MD, [facility]  03/10/2020 10:10 AM    Electrophysiology [facility]

## 2020-03-09 NOTE — Progress Notes (Signed)
Electrophysiology Office Note:    Date:  03/10/2020   ID:  Joshua Dunker., DOB 1945-09-26, MRN [id number]  PCP:  Lawerance Cruel, MD  [facility] Cardiologist:  [name], MD  [facility] Electrophysiologist:  None   Referring MD: Sueanne Margarita, MD   Chief Complaint: AF  History of Present Illness:    Joshua Folz. is a 74 y.o. male who presents for an evaluation of atrial fibrillation, ventricular tachycardia and dilated cardiomyopathy at the request of Dr. Radford Pax. Their medical history includes dilated cardiomyopathy with a recent ejection fraction of 39% by cardiac MRI, CVA, GERD, OSA on CPAP, hyperlipidemia and anemia.  For his atrial fibrillation, he is on Eliquis 5 mg twice daily and Toprol XL 25 mg daily.  He is tolerating the anticoagulant.  He recently wore a ZIO monitor which showed persistent atrial fibrillation and possible nonsustained VT for which he is referred for evaluation.  He tells me is active but is not limited by his atrial fibrillation has with any amount of exertion his heart rate increases rapidly to the 140-150 range.  When his heart rate is as fast he sometimes feels lightheaded.  He has a long history of orthostatic intolerance as well which is atrial fibrillation is likely contributing to.  He uses compression stockings that are knee-high but he is unable to tell if these are truly helping his symptoms.  He does have one episode of syncope that occurred proximally 3 months ago.  He tells me that he stood up from a seated position and started to feel lightheaded he was walking and tried to get back somewhere to safely sit down when he passed out.      Past Medical History:  Diagnosis Date  . Abnormal screening CT of chest    coronary calcium in left main and RCA  . Anemia   . Arthritis   . Complication of anesthesia    Pt reports difficult urinating  . Coronary artery calcification seen on CAT scan    no ischemic on nuclear stress  test 09/2019  . 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  Cardiac MRI 2021 with EF 39% and moderate RV dysfunction and non-compaction  . Dyspnea    with some activity  . GERD (gastroesophageal reflux disease)    occasional  . Glaucoma   . Hyperlipidemia    statin intolerant at doses > Crestor 85m 3 times weekly  . OSA on CPAP   . PAF (paroxysmal atrial fibrillation) (HCC)    CHADS2VASC score 2 on apixaban    Past Surgical History:  Procedure Laterality Date  . EYE SURGERY     bilateral cataracts  . KNEE ARTHROSCOPY W/ DEBRIDEMENT     left   . RETINAL DETACHMENT SURGERY Bilateral   . TOTAL KNEE ARTHROPLASTY Left 01/01/2018   Procedure: LEFT TOTAL KNEE ARTHROPLASTY;  Surgeon: AGaynelle Arabian MD;  Location: [facility];  Service: Orthopedics;  Laterality: Left;  50 mins  . TOTAL KNEE ARTHROPLASTY Right 06/25/2018   Procedure: TOTAL KNEE ARTHROPLASTY;  Surgeon: AGaynelle Arabian MD;  Location: [facility];  Service: Orthopedics;  Laterality: Right;  559m  . VASECTOMY      Current Medications: Current Meds  Medication Sig  . apixaban (ELIQUIS) 5 MG TABS tablet Take 1 tablet (5 mg total) by mouth 2 (two) times daily.  . brimonidine (ALPHAGAN) 0.15 % ophthalmic solution Place 1 drop into the right eye 2 (two) times  daily.  . cetirizine (ZYRTEC) 10 MG tablet Take 10 mg by mouth every evening.  . Cholecalciferol (VITAMIN D3) 50 MCG (2000 UT) TABS Take 2,000 Units by mouth at bedtime.   . Evolocumab (REPATHA SURECLICK) 086 MG/ML SOAJ Inject 1 pen into the skin every 14 (fourteen) days. (Patient taking differently: Inject 140 mg into the skin every 14 (fourteen) days. )  . ferrous sulfate 325 (65 FE) MG tablet Take 325 mg by mouth daily with breakfast.  . finasteride (PROSCAR) 5 MG tablet Take 5 mg by mouth daily.  . fluticasone (FLONASE) 50 MCG/ACT nasal spray Place 2 sprays into both nostrils 2 (two) times daily.   . Glucosamine 750 MG TABS Take 750 mg by mouth in the  morning and at bedtime.   . Multiple Vitamins-Minerals (CENTRUM MEN PO) Take 1 tablet by mouth daily.  . rosuvastatin (CRESTOR) 5 MG tablet Take 5 mg by mouth 3 (three) times a week. M-W-F  . sildenafil (REVATIO) 20 MG tablet Take 40-100 mg by mouth daily as needed (erectile dysfunction.).   Marland Kitchen tamsulosin (FLOMAX) 0.4 MG CAPS capsule Take 0.4 mg by mouth at bedtime.      Allergies:   Xarelto [rivaroxaban], Penicillins, Statins, and Vancomycin   Social History   Socioeconomic History  . Marital status: Married    Spouse name: Not on file  . Number of children: Not on file  . Years of education: Not on file  . Highest education level: Not on file  Occupational History  . Not on file  Tobacco Use  . Smoking status: Never Smoker  . Smokeless tobacco: Never Used  Vaping Use  . Vaping Use: Never used  Substance and Sexual Activity  . Alcohol use: Yes    Alcohol/week: 1.0 standard drink    Types: 1 Cans of beer per week    Comment: Rare  . Drug use: No  . Sexual activity: Not Currently  Other Topics Concern  . Not on file  Social History Narrative  . Not on file   Social Determinants of Health   Financial Resource Strain:   . Difficulty of Paying Living Expenses: Not on file  Food Insecurity:   . Worried About [employer] fund[employer] in the Last Year: Not on file  . Ran Out of Food in the Last Year: Not on file  Transportation Needs:   . Lack of Transportation (Medical): Not on file  . Lack of Transportation (Non-Medical): Not on file  Physical Activity:   . Days of Exercise per Week: Not on file  . Minutes of Exercise per Session: Not on file  Stress:   . Feeling of Stress : Not on file  Social Connections:   . Frequency of Communication with Friends and Family: Not on file  . Frequency of Social Gatherings with Friends and Family: Not on file  . Attends Religious Services: Not on file  . Active Member of Clubs or Organizations: Not on file  . Attends Archivist  Meetings: Not on file  . Marital Status: Not on file     Family History: The patient's family history includes Lung cancer in his mother.  ROS:   Please see the history of present illness.    All other systems reviewed and are negative.  EKGs/Labs/Other Studies Reviewed:    The following studies were reviewed today: cMR  02/12/2020 cMR 1. Mildly dilated left ventricle with mild concentric hypertrophy and moderately reduced systolic function (LVEF = 39%). There is  diffuse hypokinesis and no late gadolinium enhancement in the left ventricular myocardium. There are prominent trabeculations in the mid and apical segments with the ratio of non-compacted to compacted myocardium 4:1. 2. Mildly dilated right ventricle with moderately decreased systolic function (RVEF = 28%) and no regional wall motion abnormalities. 3. Severely dilated left atrium and moderately dilated right atrial size. 4. Normal size of the aortic root, ascending aorta and pulmonary artery. 5.  Mild mitral and tricuspid regurgitation. 6.  Normal pericardium.  Trivial pericardial effusion.  02/28/2020 Holter personally reviewed AF with ventricular rates 88-200.  100% burden. Most of the events labeled and SVT occurred in the setting of A. fib with rapid ventricular rates.  The average ventricular rate prior to the onset of the wide-complex tachycardia is almost exactly the same rate as the wide-complex tachycardia raising the question of a rate related aberrancy within the right bundle.  Given these episodes only occurred in the setting of RVR, my opinion is that the most likely represent aberrant conduction.  There are other areas within the ZIO report of 1-5 beats of a wide-complex rhythm which is more convincingly ventricular in origin. Rare PVCs  EKGs from March 2021, February 2020, November 2019 all show atrial fibrillation.  EKG from December 13, 2017 shows sinus rhythm.  This is the last time that I see  documentation of the patient being in normal rhythm.  EKG:  The ekg ordered today demonstrates atrial fibrillation with a ventricular rate of 83 bpm.  Recent Labs: 08/07/2019: Magnesium 2.0 12/30/2019: ALT 14 01/14/2020: BUN 14; Creatinine, Ser 0.85; Hemoglobin 14.3; Platelets 227; Potassium 4.6; Sodium 138  Recent Lipid Panel    Component Value Date/Time   CHOL 127 11/25/2019 1401   TRIG 94 11/25/2019 1401   HDL 49 11/25/2019 1401   CHOLHDL 2.6 11/25/2019 1401   CHOLHDL 3.7 08/07/2019 0614   VLDL 22 08/07/2019 0614   LDLCALC 60 11/25/2019 1401   LDLDIRECT 62 11/25/2019 1401    Physical Exam:    VS:  BP 126/68   Pulse 83   Ht 6' (1.829 m)   Wt 291 lb 6.4 oz (132.2 kg)   SpO2 96%   BMI 39.52 kg/m     Wt Readings from Last 3 Encounters:  03/10/20 291 lb 6.4 oz (132.2 kg)  02/25/20 290 lb 6.4 oz (131.7 kg)  01/15/20 287 lb (130.2 kg)     GEN:  Well nourished, well developed in no acute distress.  Obese. HEENT: Normal NECK: No JVD; No carotid bruits LYMPHATICS: No lymphadenopathy CARDIAC: Irregularly irregular, no murmurs, rubs, gallops.  Faint heart sounds. RESPIRATORY:  Clear to auscultation without rales, wheezing or rhonchi  ABDOMEN: Soft, non-tender, non-distended MUSCULOSKELETAL:  No edema; No deformity  SKIN: Warm and dry NEUROLOGIC:  Alert and oriented x 3 PSYCHIATRIC:  Normal affect   ASSESSMENT:    1. Longstanding persistent atrial fibrillation ([place])   2. NICM (nonischemic cardiomyopathy) ([place])   3. Cerebrovascular accident (CVA), unspecified mechanism ([place])   4. OSA (obstructive sleep apnea)    PLAN:    In order of problems listed above:  1. Longstanding persistent atrial fibrillation CHA2DS2-VASc of 6 for CHF, prior stroke, age and coronary disease.  On Eliquis. He has been in atrial fibrillation continuously for over 2 years.  Recent MRI shows a severely dilated left atrium.  I am concerned that given the chronicity of his atrial fibrillation and  left atrial anatomy, restoring sinus rhythm may be short-lived without antiarrhythmic therapy.  He has normal renal function but has structural heart disease and coronary artery disease that limits his antiarrhythmic therapy options.  I have discussed the options in including continuing the current plan to cardiovert without antiarrhythmic versus starting an antiarrhythmic and delaying his cardioversion for a couple weeks in an attempt to maximize the chances of success.  I think the best option for an antiarrhythmic is amiodarone given his set of comorbidities.  I discussed the risks of amiodarone and the importance of continued close follow-up for monitoring of his liver, thyroid, and lung function.  We will start 400 mg of amiodarone twice daily for 10 days and then decrease the dose to 200 mg once daily.  We will plan to cardiovert 3 weeks from today.  I will plan to see him back in clinic in 3 months.  If we are unable to achieve restoration of normal sinus rhythm, we will stop the amiodarone and assume that he is in permanent atrial fibrillation.  2.  Nonischemic cardiomyopathy MRI raises the question of a noncompaction phenotype.  Likely, his left ventricular dysfunction is multifactorial from atrial fibrillation and possible noncompaction.  3.  Cardioembolic stroke Continue Eliquis.  4.  Obstructive sleep apnea on CPAP Recommended he continue CPAP therapy  Medication Adjustments/Labs and Tests Ordered: Current medicines are reviewed at length with the patient today.  Concerns regarding medicines are outlined above.  Orders Placed This Encounter  Procedures  . Comp Met (CMET)  . TSH  . EKG 12-Lead   No orders of the defined types were placed in this encounter.    Signed, Lars Mage, MD, [facility]  03/10/2020 10:10 AM    Electrophysiology [facility]

## 2020-03-10 ENCOUNTER — Ambulatory Visit (INDEPENDENT_AMBULATORY_CARE_PROVIDER_SITE_OTHER): Payer: PPO | Admitting: Cardiology

## 2020-03-10 ENCOUNTER — Other Ambulatory Visit: Payer: Self-pay

## 2020-03-10 ENCOUNTER — Encounter: Payer: Self-pay | Admitting: Cardiology

## 2020-03-10 VITALS — BP 126/68 | HR 83 | Ht 72.0 in | Wt 291.4 lb

## 2020-03-10 DIAGNOSIS — I4811 Longstanding persistent atrial fibrillation: Secondary | ICD-10-CM | POA: Diagnosis not present

## 2020-03-10 DIAGNOSIS — I428 Other cardiomyopathies: Secondary | ICD-10-CM | POA: Diagnosis not present

## 2020-03-10 DIAGNOSIS — G4733 Obstructive sleep apnea (adult) (pediatric): Secondary | ICD-10-CM | POA: Diagnosis not present

## 2020-03-10 DIAGNOSIS — I639 Cerebral infarction, unspecified: Secondary | ICD-10-CM

## 2020-03-10 LAB — COMPREHENSIVE METABOLIC PANEL
ALT: 14 IU/L (ref 0–44)
AST: 19 IU/L (ref 0–40)
Albumin/Globulin Ratio: 1.4 (ref 1.2–2.2)
Albumin: 3.6 g/dL — ABNORMAL LOW (ref 3.7–4.7)
Alkaline Phosphatase: 95 IU/L (ref 44–121)
BUN/Creatinine Ratio: 15 (ref 10–24)
BUN: 15 mg/dL (ref 8–27)
Bilirubin Total: 0.4 mg/dL (ref 0.0–1.2)
CO2: 23 mmol/L (ref 20–29)
Calcium: 8.8 mg/dL (ref 8.6–10.2)
Chloride: 108 mmol/L — ABNORMAL HIGH (ref 96–106)
Creatinine, Ser: 0.97 mg/dL (ref 0.76–1.27)
GFR calc Af Amer: 88 mL/min/{1.73_m2} (ref >59)
GFR calc non Af Amer: 76 mL/min/{1.73_m2} (ref >59)
Globulin, Total: 2.5 g/dL (ref 1.5–4.5)
Glucose: 100 mg/dL — ABNORMAL HIGH (ref 65–99)
Potassium: 4 mmol/L (ref 3.5–5.2)
Sodium: 139 mmol/L (ref 134–144)
Total Protein: 6.1 g/dL (ref 6.0–8.5)

## 2020-03-10 LAB — TSH: TSH: 2.41 u[IU]/mL (ref 0.450–4.500)

## 2020-03-10 MED ORDER — AMIODARONE HCL 200 MG PO TABS: ORAL_TABLET | ORAL | 3 refills | Status: DC

## 2020-03-10 NOTE — Patient Instructions (Addendum)
Medication Instructions:  Your physician has recommended you make the following change in your medication:   1.  START taking amiodarone 200 mg-  A.  Take 2 tablets by mouth twice a day for 10 days  B.  THEN reduce to 1 tablet by mouth daily  Lab Work: You will get lab work today:  CMP and TSH  If you have labs (blood work) drawn today and your tests are completely normal, you will receive your results only by: Marland Kitchen MyChart Message (if you have MyChart) OR . A paper copy in the mail If you have any lab test that is abnormal or we need to change your treatment, we will call you to review the results.  Testing/Procedures: None ordered.  Follow-Up: At [facility], you and your health needs are our priority.  As part of our continuing mission to provide you with exceptional heart care, we have created designated Provider Care Teams.  These Care Teams include your primary Cardiologist (physician) and Advanced Practice Providers (APPs -  Physician Assistants and Nurse Practitioners) who all work together to provide you with the care you need, when you need it.  Your next appointment:   Your physician wants you to follow-up in: 3 months with Dr. Quentin Ore at the [facility] office.    June 10, 2019 at 11:30 am at the [facility] office   CARDIOVERSION INSTRUCTIONS:  Covid test- March 30, 2020 at 11:30 am --This is a Drive Up Visit at [address] [place], [place] [address] Someone will direct you to the appropriate testing line. Stay in your car and someone will be with you shortly.   You are scheduled for a Cardioversion on Ocotber 27, 2021 with Dr. Oval Linsey.  Please arrive at the [facility] (Main Entrance A) at [facility]: [address] [place], [place] [address] at 10:00 am   DIET: Nothing to eat or drink after midnight except a sip of water with medications (see medication instructions below)  Medication Instructions:  TAKE all AM meds with a sip of water.  You will  need to continue your anticoagulant after your procedure until you  are told by your  Provider that it is safe to stop  You must have a responsible person to drive you home and stay in the waiting area during your procedure. Failure to do so could result in cancellation.  Bring your insurance cards.  *Special Note: Every effort is made to have your procedure done on time. Occasionally there are emergencies that occur at the hospital that may cause delays. Please be patient if a delay does occur.    Amiodarone tablets What is this medicine? AMIODARONE (a MEE oh da rone) is an antiarrhythmic drug. It helps make your heart beat regularly. Because of the side effects caused by this medicine, it is only used when other medicines have not worked. It is usually used for heartbeat problems that may be life threatening. This medicine may be used for other purposes; ask your health care provider or pharmacist if you have questions. COMMON BRAND NAME(S): Cordarone, Pacerone What should I tell my health care provider before I take this medicine? They need to know if you have any of these conditions:  liver disease  lung disease  other heart problems  thyroid disease  an unusual or allergic reaction to amiodarone, iodine, other medicines, foods, dyes, or preservatives  pregnant or trying to get pregnant  breast-feeding How should I use this medicine? Take this  medicine by mouth with a glass of water. Follow the directions on the prescription label. You can take this medicine with or without food. However, you should always take it the same way each time. Take your doses at regular intervals. Do not take your medicine more often than directed. Do not stop taking except on the advice of your doctor or health care professional. A special MedGuide will be given to you by the pharmacist with each prescription and refill. Be sure to read this information carefully each time. Talk to your pediatrician  regarding the use of this medicine in children. Special care may be needed. Overdosage: If you think you have taken too much of this medicine contact a poison control center or emergency room at once. NOTE: This medicine is only for you. Do not share this medicine with others. What if I miss a dose? If you miss a dose, take it as soon as you can. If it is almost time for your next dose, take only that dose. Do not take double or extra doses. What may interact with this medicine? Do not take this medicine with any of the following medications:  abarelix  apomorphine  arsenic trioxide  certain antibiotics like erythromycin, gemifloxacin, levofloxacin, pentamidine  certain medicines for depression like amoxapine, tricyclic antidepressants  certain medicines for fungal infections like fluconazole, itraconazole, ketoconazole, posaconazole, voriconazole  certain medicines for irregular heart beat like disopyramide, dronedarone, ibutilide, propafenone, sotalol  certain medicines for malaria like chloroquine, halofantrine  cisapride  droperidol  haloperidol  hawthorn  maprotiline  methadone  phenothiazines like chlorpromazine, mesoridazine, thioridazine  pimozide  ranolazine  red yeast rice  vardenafil This medicine may also interact with the following medications:  antiviral medicines for HIV or AIDS  certain medicines for blood pressure, heart disease, irregular heart beat  certain medicines for cholesterol like atorvastatin, cerivastatin, lovastatin, simvastatin  certain medicines for hepatitis C like sofosbuvir and ledipasvir; sofosbuvir  certain medicines for seizures like phenytoin  certain medicines for thyroid problems  certain medicines that treat or prevent blood clots like warfarin  cholestyramine  cimetidine  clopidogrel  cyclosporine  dextromethorphan  diuretics  dofetilide  fentanyl  general anesthetics  grapefruit  juice  lidocaine  loratadine  methotrexate  other medicines that prolong the QT interval (cause an abnormal heart rhythm)  procainamide  quinidine  rifabutin, rifampin, or rifapentine  St. John's Wort  trazodone  ziprasidone This list may not describe all possible interactions. Give your health care provider a list of all the medicines, herbs, non-prescription drugs, or dietary supplements you use. Also tell them if you smoke, drink alcohol, or use illegal drugs. Some items may interact with your medicine. What should I watch for while using this medicine? Your condition will be monitored closely when you first begin therapy. Often, this drug is first started in a hospital or other monitored health care setting. Once you are on maintenance therapy, visit your doctor or health care professional for regular checks on your progress. Because your condition and use of this medicine carry some risk, it is a good idea to carry an identification card, necklace or bracelet with details of your condition, medications, and doctor or health care professional. Dennis Bast may get drowsy or dizzy. Do not drive, use machinery, or do anything that needs mental alertness until you know how this medicine affects you. Do not stand or sit up quickly, especially if you are an older patient. This reduces the risk of dizzy or fainting spells.  This medicine can make you more sensitive to the sun. Keep out of the sun. If you cannot avoid being in the sun, wear protective clothing and use sunscreen. Do not use sun lamps or tanning beds/booths. You should have regular eye exams before and during treatment. Call your doctor if you have blurred vision, see halos, or your eyes become sensitive to light. Your eyes may get dry. It may be helpful to use a lubricating eye solution or artificial tears solution. If you are going to have surgery or a procedure that requires contrast dyes, tell your doctor or health care professional  that you are taking this medicine. What side effects may I notice from receiving this medicine? Side effects that you should report to your doctor or health care professional as soon as possible:  allergic reactions like skin rash, itching or hives, swelling of the face, lips, or tongue  blue-gray coloring of the skin  blurred vision, seeing blue green halos, increased sensitivity of the eyes to light  breathing problems  chest pain  dark urine  fast, irregular heartbeat  feeling faint or light-headed  intolerance to heat or cold  nausea or vomiting  pain and swelling of the scrotum  pain, tingling, numbness in feet, hands  redness, blistering, peeling or loosening of the skin, including inside the mouth  spitting up blood  stomach pain  sweating  unusual or uncontrolled movements of body  unusually weak or tired  weight gain or loss  yellowing of the eyes or skin Side effects that usually do not require medical attention (report to your doctor or health care professional if they continue or are bothersome):  change in sex drive or performance  constipation  dizziness  headache  loss of appetite  trouble sleeping This list may not describe all possible side effects. Call your doctor for medical advice about side effects. You may report side effects to FDA at 1-800-FDA-1088. Where should I keep my medicine? Keep out of the reach of children. Store at room temperature between 20 and 25 degrees C (68 and 77 degrees F). Protect from light. Keep container tightly closed. Throw away any unused medicine after the expiration date. NOTE: This sheet is a summary. It may not cover all possible information. If you have questions about this medicine, talk to your doctor, pharmacist, or health care provider.  2020 Elsevier/Gold Standard (2018-04-25 13:44:04)

## 2020-03-16 ENCOUNTER — Other Ambulatory Visit: Payer: PPO

## 2020-03-16 ENCOUNTER — Other Ambulatory Visit (HOSPITAL_COMMUNITY): Payer: PPO

## 2020-03-28 DIAGNOSIS — G4733 Obstructive sleep apnea (adult) (pediatric): Secondary | ICD-10-CM | POA: Diagnosis not present

## 2020-03-30 ENCOUNTER — Other Ambulatory Visit (HOSPITAL_COMMUNITY)
Admission: RE | Admit: 2020-03-30 | Discharge: 2020-03-30 | Disposition: A | Discharge status: Home / Self Care | Payer: PPO | Source: Ambulatory Visit | Attending: Cardiovascular Disease | Admitting: Cardiovascular Disease

## 2020-03-30 DIAGNOSIS — Z01812 Encounter for preprocedural laboratory examination: Secondary | ICD-10-CM | POA: Diagnosis not present

## 2020-03-30 DIAGNOSIS — Z20822 Contact with and (suspected) exposure to covid-19: Secondary | ICD-10-CM | POA: Insufficient documentation

## 2020-03-30 LAB — SARS CORONAVIRUS 2 (TAT 6-24 HRS): SARS Coronavirus 2: NEGATIVE

## 2020-04-01 ENCOUNTER — Ambulatory Visit (HOSPITAL_COMMUNITY): Payer: PPO | Admitting: Certified Registered Nurse Anesthetist

## 2020-04-01 ENCOUNTER — Encounter (HOSPITAL_COMMUNITY)
Admission: RE | Disposition: A | Discharge status: Home / Self Care | Payer: Self-pay | Source: Home / Self Care | Attending: Cardiovascular Disease

## 2020-04-01 ENCOUNTER — Encounter (HOSPITAL_COMMUNITY): Payer: Self-pay | Admitting: Cardiovascular Disease

## 2020-04-01 ENCOUNTER — Other Ambulatory Visit: Payer: Self-pay

## 2020-04-01 ENCOUNTER — Ambulatory Visit (HOSPITAL_COMMUNITY)
Admission: RE | Admit: 2020-04-01 | Discharge: 2020-04-01 | Disposition: A | Discharge status: Home / Self Care | Payer: PPO | Attending: Cardiovascular Disease | Admitting: Cardiovascular Disease

## 2020-04-01 DIAGNOSIS — I2510 Atherosclerotic heart disease of native coronary artery without angina pectoris: Secondary | ICD-10-CM | POA: Insufficient documentation

## 2020-04-01 DIAGNOSIS — I4811 Longstanding persistent atrial fibrillation: Secondary | ICD-10-CM | POA: Diagnosis not present

## 2020-04-01 DIAGNOSIS — Z888 Allergy status to other drugs, medicaments and biological substances status: Secondary | ICD-10-CM | POA: Diagnosis not present

## 2020-04-01 DIAGNOSIS — Z881 Allergy status to other antibiotic agents status: Secondary | ICD-10-CM | POA: Diagnosis not present

## 2020-04-01 DIAGNOSIS — I420 Dilated cardiomyopathy: Secondary | ICD-10-CM | POA: Insufficient documentation

## 2020-04-01 DIAGNOSIS — I4819 Other persistent atrial fibrillation: Secondary | ICD-10-CM

## 2020-04-01 DIAGNOSIS — Z880 Allergy status to penicillin: Secondary | ICD-10-CM | POA: Insufficient documentation

## 2020-04-01 DIAGNOSIS — I480 Paroxysmal atrial fibrillation: Secondary | ICD-10-CM | POA: Diagnosis not present

## 2020-04-01 DIAGNOSIS — I639 Cerebral infarction, unspecified: Secondary | ICD-10-CM | POA: Diagnosis not present

## 2020-04-01 DIAGNOSIS — G4733 Obstructive sleep apnea (adult) (pediatric): Secondary | ICD-10-CM | POA: Insufficient documentation

## 2020-04-01 DIAGNOSIS — Z8673 Personal history of transient ischemic attack (TIA), and cerebral infarction without residual deficits: Secondary | ICD-10-CM | POA: Insufficient documentation

## 2020-04-01 HISTORY — PX: CARDIOVERSION: SHX1299

## 2020-04-01 SURGERY — CARDIOVERSION
Anesthesia: General

## 2020-04-01 MED ORDER — SODIUM CHLORIDE 0.9 % IV SOLN: INTRAVENOUS | Status: DC | PRN

## 2020-04-01 MED ORDER — PROPOFOL 10 MG/ML IV BOLUS
INTRAVENOUS | Status: DC | PRN
  Administered 2020-04-01: 60 mg via INTRAVENOUS
  Administered 2020-04-01: 30 mg via INTRAVENOUS

## 2020-04-01 MED ORDER — LIDOCAINE 2% (20 MG/ML) 5 ML SYRINGE
INTRAMUSCULAR | Status: DC | PRN
  Administered 2020-04-01: 60 mg via INTRAVENOUS

## 2020-04-01 NOTE — CV Procedure (Signed)
Electrical Cardioversion Procedure Note Colm Lyford [id number] 1946/03/09  Procedure: Electrical Cardioversion Indications:  Atrial Fibrillation  Procedure Details Consent: Risks of procedure as well as the alternatives and risks of each were explained to the (patient/caregiver).  Consent for procedure obtained. Time Out: Verified patient identification, verified procedure, site/side was marked, verified correct patient position, special equipment/implants available, medications/allergies/relevent history reviewed, required imaging and test results available.  Performed  Patient placed on cardiac monitor, pulse oximetry, supplemental oxygen as necessary.  Sedation given: propofol Pacer pads placed anterior and posterior chest.  Cardioverted 1 time(s).  Cardioverted at [place].  Evaluation Findings: Post procedure EKG shows: sinus rhythm with PVCs Complications: None Patient did tolerate procedure well.   Skeet Latch, MD 04/01/2020, 11:37 AM

## 2020-04-01 NOTE — Anesthesia Preprocedure Evaluation (Addendum)
Anesthesia Evaluation  Patient identified by MRN, date of birth, ID band Patient awake    Reviewed: Allergy & Precautions, NPO status , Patient's Chart, lab work & pertinent test results  History of Anesthesia Complications Negative for: history of anesthetic complications  Airway Mallampati: II  TM Distance: >3 FB Neck ROM: Full    Dental  (+) Dental Advisory Given, Teeth Intact   Pulmonary sleep apnea and Continuous Positive Airway Pressure Ventilation ,    Pulmonary exam normal        Cardiovascular + CAD and +CHF  + dysrhythmias Atrial Fibrillation  Rhythm:Irregular Rate:Normal   '21 Myoperfusion - The left ventricular ejection fraction is moderately decreased (30-44%). Nuclear stress EF: 39%. There was no ST segment deviation noted during stress. This is an intermediate risk study.  '21 TTE - EF 45 to 50%. Global hypokinesis. There is mild left ventricular hypertrophy. RV systolic function is mildly reduced. LA was mildly dilated. Trivial mitral valve regurgitation. Mild aortic valve sclerosis is present    Neuro/Psych CVA, No Residual Symptoms negative psych ROS   GI/Hepatic Neg liver ROS, GERD  Controlled and Medicated,  Endo/Other  negative endocrine ROS  Renal/GU negative Renal ROS     Musculoskeletal  (+) Arthritis ,   Abdominal   Peds  Hematology negative hematology ROS (+)   Anesthesia Other Findings Covid test negative   Reproductive/Obstetrics                            Anesthesia Physical Anesthesia Plan  ASA: III  Anesthesia Plan: General   Post-op Pain Management:    Induction: Intravenous  PONV Risk Score and Plan: 2 and Treatment may vary due to age or medical condition and Propofol infusion  Airway Management Planned: Mask and Natural Airway  Additional Equipment: None  Intra-op Plan:   Post-operative Plan:   Informed Consent: I have reviewed the  patients History and Physical, chart, labs and discussed the procedure including the risks, benefits and alternatives for the proposed anesthesia with the patient or authorized representative who has indicated his/her understanding and acceptance.       Plan Discussed with: CRNA and Anesthesiologist  Anesthesia Plan Comments:        Anesthesia Quick Evaluation

## 2020-04-01 NOTE — Anesthesia Postprocedure Evaluation (Signed)
Anesthesia Post Note  Patient: Joshua Moran.  Procedure(s) Performed: CARDIOVERSION (N/A )     Patient location during evaluation: PACU Anesthesia Type: General Level of consciousness: awake and alert Pain management: pain level controlled Vital Signs Assessment: post-procedure vital signs reviewed and stable Respiratory status: spontaneous breathing, nonlabored ventilation and respiratory function stable Cardiovascular status: blood pressure returned to baseline and stable Postop Assessment: no apparent nausea or vomiting Anesthetic complications: no   No complications documented.  Last Vitals:  Vitals:   04/01/20 1140 04/01/20 1150  BP: 117/65 134/63  Pulse: (!) 57 (!) 56  Resp: (!) 9 13  Temp: (!) 36.3 C   SpO2: 100% 100%    Last Pain:  Vitals:   04/01/20 1200  TempSrc:   PainSc: 0-No pain                 Audry Pili

## 2020-04-01 NOTE — Discharge Instructions (Signed)
Electrical Cardioversion Electrical cardioversion is the delivery of a jolt of electricity to restore a normal rhythm to the heart. A rhythm that is too fast or is not regular keeps the heart from pumping well. In this procedure, sticky patches or metal paddles are placed on the chest to deliver electricity to the heart from a device. This procedure may be done in an emergency if:  There is low or no blood pressure as a result of the heart rhythm.  Normal rhythm must be restored as fast as possible to protect the brain and heart from further damage.  It may save a life. This may also be a scheduled procedure for irregular or fast heart rhythms that are not immediately life-threatening. Tell a health care provider about:  Any allergies you have.  All medicines you are taking, including vitamins, herbs, eye drops, creams, and over-the-counter medicines.  Any problems you or family members have had with anesthetic medicines.  Any blood disorders you have.  Any surgeries you have had.  Any medical conditions you have.  Whether you are pregnant or may be pregnant. What are the risks? Generally, this is a safe procedure. However, problems may occur, including:  Allergic reactions to medicines.  A blood clot that breaks free and travels to other parts of your body.  The possible return of an abnormal heart rhythm within hours or days after the procedure.  Your heart stopping (cardiac arrest). This is rare. What happens before the procedure? Medicines  Your health care provider may have you start taking: ? Blood-thinning medicines (anticoagulants) so your blood does not clot as easily. ? Medicines to help stabilize your heart rate and rhythm.  Ask your health care provider about: ? Changing or stopping your regular medicines. This is especially important if you are taking diabetes medicines or blood thinners. ? Taking medicines such as aspirin and ibuprofen. These medicines can  thin your blood. Do not take these medicines unless your health care provider tells you to take them. ? Taking over-the-counter medicines, vitamins, herbs, and supplements. General instructions  Follow instructions from your health care provider about eating or drinking restrictions.  Plan to have someone take you home from the hospital or clinic.  If you will be going home right after the procedure, plan to have someone with you for 24 hours.  Ask your health care provider what steps will be taken to help prevent infection. These may include washing your skin with a germ-killing soap. What happens during the procedure?   An IV will be inserted into one of your veins.  Sticky patches (electrodes) or metal paddles may be placed on your chest.  You will be given a medicine to help you relax (sedative).  An electrical shock will be delivered. The procedure may vary among health care providers and hospitals. What can I expect after the procedure?  Your blood pressure, heart rate, breathing rate, and blood oxygen level will be monitored until you leave the hospital or clinic.  Your heart rhythm will be watched to make sure it does not change.  You may have some redness on the skin where the shocks were given. Follow these instructions at home:  Do not drive for 24 hours if you were given a sedative during your procedure.  Take over-the-counter and prescription medicines only as told by your health care provider.  Ask your health care provider how to check your pulse. Check it often.  Rest for 48 hours after the procedure or   as told by your health care provider.  Avoid or limit your caffeine use as told by your health care provider.  Keep all follow-up visits as told by your health care provider. This is important. Contact a health care provider if:  You feel like your heart is beating too quickly or your pulse is not regular.  You have a serious muscle cramp that does not go  away. Get help right away if:  You have discomfort in your chest.  You are dizzy or you feel faint.  You have trouble breathing or you are short of breath.  Your speech is slurred.  You have trouble moving an arm or leg on one side of your body.  Your fingers or toes turn cold or blue. Summary  Electrical cardioversion is the delivery of a jolt of electricity to restore a normal rhythm to the heart.  This procedure may be done right away in an emergency or may be a scheduled procedure if the condition is not an emergency.  Generally, this is a safe procedure.  After the procedure, check your pulse often as told by your health care provider. This information is not intended to replace advice given to you by your health care provider. Make sure you discuss any questions you have with your health care provider. Document Revised: 12/24/2018 Document Reviewed: 12/24/2018 Elsevier Patient Education  2020 Elsevier Inc.  

## 2020-04-01 NOTE — Transfer of Care (Signed)
Immediate Anesthesia Transfer of Care Note  Patient: Joshua Moran.  Procedure(s) Performed: CARDIOVERSION (N/A )  Patient Location: Endoscopy Unit  Anesthesia Type:General  Level of Consciousness: awake, patient cooperative and responds to stimulation  Airway & Oxygen Therapy: Patient Spontanous Breathing and Patient connected to nasal cannula oxygen  Post-op Assessment: Report given to RN and Post -op Vital signs reviewed and stable  Post vital signs: Reviewed and stable  Last Vitals:  Vitals Value Taken Time  BP    Temp    Pulse 57 04/01/20 1139  Resp 9 04/01/20 1139  SpO2 100 % 04/01/20 1139  Vitals shown include unvalidated device data.  Last Pain:  Vitals:   04/01/20 1103  TempSrc: Oral  PainSc: 0-No pain         Complications: No complications documented.

## 2020-04-01 NOTE — Interval H&P Note (Signed)
History and Physical Interval Note:  04/01/2020 11:32 AM  Joshua Moran.  has presented today for surgery, with the diagnosis of AFIB.  The various methods of treatment have been discussed with the patient and family. After consideration of risks, benefits and other options for treatment, the patient has consented to  Procedure(s): CARDIOVERSION (N/A) as a surgical intervention.  The patient's history has been reviewed, patient examined, no change in status, stable for surgery.  I have reviewed the patient's chart and labs.  Questions were answered to the patient's satisfaction.     Skeet Latch, MD

## 2020-04-13 ENCOUNTER — Other Ambulatory Visit: Payer: Self-pay

## 2020-04-13 ENCOUNTER — Other Ambulatory Visit (HOSPITAL_COMMUNITY): Payer: Self-pay | Admitting: *Deleted

## 2020-04-13 ENCOUNTER — Ambulatory Visit (HOSPITAL_COMMUNITY)
Admission: RE | Admit: 2020-04-13 | Discharge: 2020-04-13 | Disposition: A | Discharge status: Home / Self Care | Payer: PPO | Source: Ambulatory Visit | Attending: Cardiology | Admitting: Cardiology

## 2020-04-13 ENCOUNTER — Encounter (HOSPITAL_COMMUNITY): Payer: Self-pay | Admitting: Cardiology

## 2020-04-13 VITALS — BP 150/90 | HR 72 | Wt 294.6 lb

## 2020-04-13 DIAGNOSIS — Z79899 Other long term (current) drug therapy: Secondary | ICD-10-CM | POA: Insufficient documentation

## 2020-04-13 DIAGNOSIS — I5022 Chronic systolic (congestive) heart failure: Secondary | ICD-10-CM | POA: Diagnosis not present

## 2020-04-13 DIAGNOSIS — G4733 Obstructive sleep apnea (adult) (pediatric): Secondary | ICD-10-CM | POA: Diagnosis not present

## 2020-04-13 DIAGNOSIS — I951 Orthostatic hypotension: Secondary | ICD-10-CM | POA: Insufficient documentation

## 2020-04-13 DIAGNOSIS — Z8673 Personal history of transient ischemic attack (TIA), and cerebral infarction without residual deficits: Secondary | ICD-10-CM | POA: Insufficient documentation

## 2020-04-13 DIAGNOSIS — I480 Paroxysmal atrial fibrillation: Secondary | ICD-10-CM | POA: Diagnosis not present

## 2020-04-13 DIAGNOSIS — I428 Other cardiomyopathies: Secondary | ICD-10-CM

## 2020-04-13 DIAGNOSIS — I420 Dilated cardiomyopathy: Secondary | ICD-10-CM

## 2020-04-13 DIAGNOSIS — I4819 Other persistent atrial fibrillation: Secondary | ICD-10-CM | POA: Diagnosis not present

## 2020-04-13 DIAGNOSIS — Z7901 Long term (current) use of anticoagulants: Secondary | ICD-10-CM | POA: Insufficient documentation

## 2020-04-13 HISTORY — DX: Heart failure, unspecified: I50.9

## 2020-04-13 MED ORDER — EMPAGLIFLOZIN 10 MG PO TABS: 10.0000 mg | ORAL_TABLET | Freq: Every day | ORAL | 6 refills | Status: DC

## 2020-04-13 MED ORDER — SPIRONOLACTONE 25 MG PO TABS: 12.5000 mg | ORAL_TABLET | Freq: Every day | ORAL | 3 refills | Status: DC

## 2020-04-13 NOTE — Progress Notes (Signed)
ReDS Vest / Clip - 04/13/20 1500      ReDS Vest / Clip   Station Marker D    Ruler Value 32    ReDS Value Range Low volume    ReDS Actual Value 32

## 2020-04-13 NOTE — Progress Notes (Signed)
Medication Samples have been provided to the patient.  Drug name: Jardiance       Strength: 10 mg        Qty: 4  LOT: [id number], [id number]  Exp.Date: 8/23, 8/23, 2/22, 10/22  Dosing instructions: Take 1 tab Daily  The patient has been instructed regarding the correct time, dose, and frequency of taking this medication, including desired effects and most common side effects.   Tanveer Brammer 4:38 PM 04/13/2020

## 2020-04-13 NOTE — Progress Notes (Signed)
PCP: Lawerance Cruel, MD Cardiology: Dr. Radford Pax HF Cardiology: Dr. Aundra Dubin  74 y.o. with history of chronic systolic CHF and paroxysmal atrial fibrillation was referred by Dr. Radford Pax for evaluation of CHF.  Patient has been short of breath for years, worse over the last 6 months.  He has had an extensive workup.  Echo in 3/21 showed EF 45-50% with mildly decreased RV function.  Cardiolite in 4/21 showed fixed apical defect.  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  PYP scan in 8/21 was not suggestive of TTR cardiac amyloidosis.   He had been in persistent atrial fibrillation for about 2 years.  He saw Dr. Quentin Ore and was started on amiodarone.  DCCV was done in 10/21 back to NSR. He is in NSR today.  He was found to have M-spike on SPEP.  He saw Dr. Marin Olp who thought this was MGUS.   Initially, patient felt better after DCCV.  However, he has started to get exertional dyspnea again.  He is no longer getting orthostatic lightheadedness since DCCV, and BP is actually elevated today. He uses compression stockings and has an abdominal binder which he does not use.  He is able to walk on a treadmill for about 15 minutes.  He is short of breath walking up stairs or a hill.  No orthopnea/PND.  He is short of breath carrying a load and mildly short of breath with dressing.  No BRBPR/melena on Eliquis.    REDS clip: 32%  ECG (personally reviewed): NSR, nonspecific T wave flattening  Labs (7/21): Urine immunofixation with monoclonal IgG protein, SPEP with M-spike.   Labs (10/21): TSH normal, K 4, creatinine 0.97, LFTs normal  PMH: 1. CVA: Occurred in 3/21, he was not on apixaban at the time.  2. GERD 3. OSA: Uses CPAP.  4. Hyperlipidemia 5. Atrial fibrillation: Paroxysmal.  - DCCV to NSR in 10/21.  6. MGUS 7. Chronic systolic CHF: Echo (4/09) with EF 45-50%, mild LVH, mildly decreased RV systolic function.  -  Cardiolite (4/21): Fixed apical defect.  - Cardiac MRI (9/21): Mild LV dilation with mild LV hypertrophy, EF 39%, prominent trabeculations concerning for noncompaction. Mild RV dilation with EF 28%, severe LAE.  No delayed enhancement.  - PYP scan (8/21): grade 1, H/CL 1-1.5.  Unlikely to have cardiac amyloidosis.  8. Orthostatic hypotension.   Social History   Socioeconomic History  . Marital status: Married    Spouse name: Not on file  . Number of children: Not on file  . Years of education: Not on file  . Highest education level: Not on file  Occupational History  . Not on file  Tobacco Use  . Smoking status: Never Smoker  . Smokeless tobacco: Never Used  Vaping Use  . Vaping Use: Never used  Substance and Sexual Activity  . Alcohol use: Yes    Alcohol/week: 1.0 standard drink    Types: 1 Cans of beer per week    Comment: Rare  . Drug use: No  . Sexual activity: Not Currently  Other Topics Concern  . Not on file  Social History Narrative  . Not on file   Social Determinants of Health   Financial Resource Strain:   . Difficulty of Paying Living Expenses: Not on file  Food Insecurity:   . Worried About Charity fundraiser in the Last Year: Not on file  . Ran Out of Food  in the Last Year: Not on file  Transportation Needs:   . Lack of Transportation (Medical): Not on file  . Lack of Transportation (Non-Medical): Not on file  Physical Activity:   . Days of Exercise per Week: Not on file  . Minutes of Exercise per Session: Not on file  Stress:   . Feeling of Stress : Not on file  Social Connections:   . Frequency of Communication with Friends and Family: Not on file  . Frequency of Social Gatherings with Friends and Family: Not on file  . Attends Religious Services: Not on file  . Active Member of Clubs or Organizations: Not on file  . Attends Archivist Meetings: Not on file  . Marital Status: Not on file  Intimate Partner Violence:   . Fear of Current  or Ex-Partner: Not on file  . Emotionally Abused: Not on file  . Physically Abused: Not on file  . Sexually Abused: Not on file   Family History  Problem Relation Age of Onset  . Lung cancer Mother        smoker   ROS: All systems reviewed and negative except as per HPI.   Current Outpatient Medications  Medication Sig Dispense Refill  . amiodarone (PACERONE) 200 MG tablet Take 200 mg by mouth daily.    Marland Kitchen apixaban (ELIQUIS) 5 MG TABS tablet Take 1 tablet (5 mg total) by mouth 2 (two) times daily. 60 tablet 0  . brimonidine (ALPHAGAN) 0.15 % ophthalmic solution Place 1 drop into the right eye 2 (two) times daily.    . cetirizine (ZYRTEC) 10 MG tablet Take 10 mg by mouth every evening.    . Cholecalciferol (VITAMIN D3) 50 MCG (2000 UT) TABS Take 2,000 Units by mouth at bedtime.     . Evolocumab (REPATHA SURECLICK) 073 MG/ML SOAJ Inject 1 pen into the skin every 14 (fourteen) days. (Patient taking differently: Inject 140 mg into the skin every 14 (fourteen) days. ) 2 pen 11  . ferrous sulfate 325 (65 FE) MG tablet Take 325 mg by mouth daily with breakfast.    . finasteride (PROSCAR) 5 MG tablet Take 5 mg by mouth daily.    . fluticasone (FLONASE) 50 MCG/ACT nasal spray Place 2 sprays into both nostrils 2 (two) times daily.     . Glucosamine 750 MG TABS Take 750 mg by mouth in the morning and at bedtime.     . Melatonin 10 MG TABS Take by mouth at bedtime.    . Multiple Vitamins-Minerals (CENTRUM MEN PO) Take 1 tablet by mouth daily.    . rosuvastatin (CRESTOR) 5 MG tablet Take 5 mg by mouth 3 (three) times a week. M-W-F    . sildenafil (REVATIO) 20 MG tablet Take 40-100 mg by mouth daily as needed (erectile dysfunction.).   5  . tamsulosin (FLOMAX) 0.4 MG CAPS capsule Take 0.4 mg by mouth at bedtime.     . empagliflozin (JARDIANCE) 10 MG TABS tablet Take 1 tablet (10 mg total) by mouth daily before breakfast. 30 tablet 6  . spironolactone (ALDACTONE) 25 MG tablet Take 0.5 tablets (12.5 mg  total) by mouth at bedtime. 15 tablet 3   No current facility-administered medications for this encounter.   BP (!) 150/90   Pulse 72   Wt 133.6 kg (294 lb 9.6 oz)   SpO2 95%   BMI 39.95 kg/m  General: NAD Neck: JVP 8 cm, no thyromegaly or thyroid nodule.  Lungs: Clear to auscultation bilaterally  with normal respiratory effort. CV: Nondisplaced PMI.  Heart regular S1/S2, no S3/S4, no murmur.  1+ ankle edema.  No carotid bruit.  Normal pedal pulses.  Abdomen: Soft, nontender, no hepatosplenomegaly, no distention.  Skin: Intact without lesions or rashes.  Neurologic: Alert and oriented x 3.  Psych: Normal affect. Extremities: No clubbing or cyanosis.  HEENT: Normal.   Assessment/Plan: 1. Chronic systolic CHF: Cardiac MRI in 9/21 with EF 39% and evidence for noncompaction cardiomyopathy, no delayed enhancement, significant RV dysfunction with RV EF 22%.  PYP scan was not suggestive of TTR amyloidosis.  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  However, CAD has not been ruled out (fixed apical defect on 4/21 Cardiolite).  On exam, he is not volume overloaded.  REDS clip 32%.  NYHA class III symptoms, some improvement since DCCV.  He has a history of orthostatic hypotension and is not on cardiac meds. However, not having orthostatic symptoms now that he has been cardioverted.  - Start SGLT2 inhibitor => his insurance will cover Jardiance 10 mg daily.  - Start spironolactone 12.5 mg daily.  BMET today and 10 days.  - Will see how BP tolerates these meds, continue Entresto next if not orthostatic symptoms.  - I will arrange for LHC/RHC to assess filling pressures/cardiac output and rule out coronary disease.  This will be done > 4 wks after his cardioversion.  He will need to hold Eliquis the day before and the day of procedure.  We discussed risks/benefits and he agrees to procedure.  2. Atrial fibrillation: Paroxysmal.  He remains in NSR after DCCV in 10/21.  He is  at significant risk for recurrent atrial fibrillation given severe LAE.  - Continue amiodarone 200 mg daily, LFTs today.  - Continue apixaban 5 mg bid.  - Consider ablation in the future, will discuss with Dr. Quentin Ore.  3. OSA: Continue CPAP.  4. Orthostatic hypotension: Not symptomatic since DCCV.   - Continue compression stockings and follow over time.  This may limit cardiac med initiation.   Loralie Champagne 04/13/2020

## 2020-04-13 NOTE — Patient Instructions (Addendum)
Start Spironolactone 12.5 mg (1/2 tab) Daily at Bedtime  Start Jardiance 10 mg Daily  Your provider has prescribed Jardiance for you. Please be aware the most common side effect of this medication is urinary tract infections and yeast infections. Please practice good hygiene and keep this area clean and dry to help prevent this. If you do begin to have symptoms of these infections, such as difficulty urinating or painful urination,  please let us know.  Your physician recommends that you return for lab work in: 10-14 days  Your physician has requested that you have a cardiac catheterization. Cardiac catheterization is used to diagnose and/or treat various heart conditions. Doctors may recommend this procedure for a number of different reasons. The most common reason is to evaluate chest pain. Chest pain can be a symptom of coronary artery disease (CAD), and cardiac catheterization can show whether plaque is narrowing or blocking your heart's arteries. This procedure is also used to evaluate the valves, as well as measure the blood flow and oxygen levels in different parts of your heart. For further information please visit [email or web address]. Please follow instruction sheet, as given.  Your physician recommends that you schedule a follow-up appointment in: 6 weeks  If you have any questions or concerns before your next appointment please send [place] a message through [place] or call our office at [phone].    TO LEAVE A MESSAGE FOR THE NURSE SELECT OPTION 2, PLEASE LEAVE A MESSAGE INCLUDING: . YOUR NAME . DATE OF BIRTH . CALL BACK NUMBER . REASON FOR CALL**this is important as we prioritize the call backs  [place] AS LONG AS YOU CALL BEFORE 4:00 PM  At the [facility], you and your health needs are our priority. As part of our continuing mission to provide you with exceptional heart care, we have created designated Provider Care Teams. These  Care Teams include your primary Cardiologist (physician) and Advanced Practice Providers (APPs- Physician Assistants and Nurse Practitioners) who all work together to provide you with the care you need, when you need it.   You may see any of the following providers on your designated Care Team at your next follow up: Marland Kitchen Dr Glori Bickers . Dr Loralie Champagne . Darrick Grinder, NP . Lyda Jester, PA . Audry Riles, PharmD   Please be sure to bring in all your medications bottles to every appointment.      CATHETERIZATION INSTRUCTIONS:  You are scheduled for a Cardiac Catheterization on Wednesday, December 1 with Dr. Loralie Champagne.  1. Please arrive at the [facility] (Main Entrance A) at [facility]: [address] [place], [place] [address] at 6:30 AM (This time is two hours before your procedure to ensure your preparation). Free valet parking service is available.   Special note: Every effort is made to have your procedure done on time. Please understand that emergencies sometimes delay scheduled procedures.  2. Diet: Do not eat solid foods after midnight.  The patient may have clear liquids until 5am upon the day of the procedure.  3. Labs: ON 04/27/20      COVID TEST: Monday 05/04/20 at 10:15 AM  4. Medication instructions in preparation for your procedure:   DO NOT TAKE ELIQUIS ON Tuesday 11/30 OR WED 12/1 AM  On the morning of your procedure, take any morning medicines NOT listed above.  You may use sips of water.  5. Plan for one night stay--bring personal belongings.  6. Bring a current list of your medications and current insurance cards. 7. You MUST have a responsible person to drive you home. 8. Someone MUST be with you the first 24 hours after you arrive home or your discharge will be delayed. 9. Please wear clothes that are easy to get on and off and wear slip-on shoes.  Thank you for allowing [place] to care for you!   -- [place] [facility]

## 2020-04-20 ENCOUNTER — Telehealth (HOSPITAL_COMMUNITY): Payer: Self-pay | Admitting: *Deleted

## 2020-04-20 NOTE — Telephone Encounter (Signed)
Hold spironolactone for a day and move the medication to the evening before bed.  Move Jardiance to the evening before bed.  Needs BMET.

## 2020-04-20 NOTE — Telephone Encounter (Signed)
Pt left VM stating the past two mornings hes been very dizzy to the point he has two sit down or he feels like he is going to pass out. I called pt back to get more information and he said the dizziness gets worse if he stands still for too long or if he walks and then comes to a stop the dizziness will start.   Routed to [place] for advice

## 2020-04-20 NOTE — Telephone Encounter (Signed)
Pt aware and verbalized understanding. Pt has lab appt 11/22.

## 2020-04-22 DIAGNOSIS — N401 Enlarged prostate with lower urinary tract symptoms: Secondary | ICD-10-CM | POA: Diagnosis not present

## 2020-04-22 DIAGNOSIS — I4891 Unspecified atrial fibrillation: Secondary | ICD-10-CM | POA: Diagnosis not present

## 2020-04-22 DIAGNOSIS — E7800 Pure hypercholesterolemia, unspecified: Secondary | ICD-10-CM | POA: Diagnosis not present

## 2020-04-22 DIAGNOSIS — D649 Anemia, unspecified: Secondary | ICD-10-CM | POA: Diagnosis not present

## 2020-04-22 DIAGNOSIS — I639 Cerebral infarction, unspecified: Secondary | ICD-10-CM | POA: Diagnosis not present

## 2020-04-22 DIAGNOSIS — M179 Osteoarthritis of knee, unspecified: Secondary | ICD-10-CM | POA: Diagnosis not present

## 2020-04-27 ENCOUNTER — Other Ambulatory Visit: Payer: Self-pay

## 2020-04-27 ENCOUNTER — Ambulatory Visit (HOSPITAL_COMMUNITY)
Admission: RE | Admit: 2020-04-27 | Discharge: 2020-04-27 | Disposition: A | Discharge status: Home / Self Care | Payer: PPO | Source: Ambulatory Visit | Attending: Internal Medicine | Admitting: Internal Medicine

## 2020-04-27 ENCOUNTER — Other Ambulatory Visit (HOSPITAL_COMMUNITY): Payer: PPO

## 2020-04-27 DIAGNOSIS — I420 Dilated cardiomyopathy: Secondary | ICD-10-CM | POA: Diagnosis not present

## 2020-04-27 LAB — COMPREHENSIVE METABOLIC PANEL
ALT: 27 U/L (ref 0–44)
AST: 25 U/L (ref 15–41)
Albumin: 3.4 g/dL — ABNORMAL LOW (ref 3.5–5.0)
Alkaline Phosphatase: 78 U/L (ref 38–126)
Anion gap: 8 (ref 5–15)
BUN: 16 mg/dL (ref 8–23)
CO2: 26 mmol/L (ref 22–32)
Calcium: 9 mg/dL (ref 8.9–10.3)
Chloride: 105 mmol/L (ref 98–111)
Creatinine, Ser: 1.07 mg/dL (ref 0.61–1.24)
GFR, Estimated: >60 mL/min (ref >60)
Glucose, Bld: 101 mg/dL — ABNORMAL HIGH (ref 70–99)
Potassium: 4.8 mmol/L (ref 3.5–5.1)
Sodium: 139 mmol/L (ref 135–145)
Total Bilirubin: 0.6 mg/dL (ref 0.3–1.2)
Total Protein: 6.9 g/dL (ref 6.5–8.1)

## 2020-04-27 LAB — CBC
HCT: 46.1 % (ref 39.0–52.0)
Hemoglobin: 14.7 g/dL (ref 13.0–17.0)
MCH: 30.3 pg (ref 26.0–34.0)
MCHC: 31.9 g/dL (ref 30.0–36.0)
MCV: 95.1 fL (ref 80.0–100.0)
Platelets: 240 10*3/uL (ref 150–400)
RBC: 4.85 MIL/uL (ref 4.22–5.81)
RDW: 13.7 % (ref 11.5–15.5)
WBC: 6.6 10*3/uL (ref 4.0–10.5)
nRBC: 0 % (ref 0.0–0.2)

## 2020-04-28 ENCOUNTER — Encounter (HOSPITAL_COMMUNITY): Payer: Self-pay

## 2020-04-28 ENCOUNTER — Telehealth (HOSPITAL_COMMUNITY): Payer: Self-pay | Admitting: *Deleted

## 2020-04-28 DIAGNOSIS — G4733 Obstructive sleep apnea (adult) (pediatric): Secondary | ICD-10-CM | POA: Diagnosis not present

## 2020-04-28 NOTE — Telephone Encounter (Signed)
He can either 1. Try taking Jardiance at night before bed or, if he feels the dizziness is too severe, 2. Stop it altogether.

## 2020-04-28 NOTE — Telephone Encounter (Signed)
Pt left msg stating he has extreme dizziness from Jardiance in the AM but it gets better in the afternoon. Pt said dizziness is so bad he can't walk.    Routed to [place]

## 2020-04-28 NOTE — Telephone Encounter (Signed)
Pt aware.

## 2020-05-04 ENCOUNTER — Other Ambulatory Visit (HOSPITAL_COMMUNITY)
Admission: RE | Admit: 2020-05-04 | Discharge: 2020-05-04 | Disposition: A | Discharge status: Home / Self Care | Payer: PPO | Source: Ambulatory Visit | Attending: Cardiology | Admitting: Cardiology

## 2020-05-04 DIAGNOSIS — Z20822 Contact with and (suspected) exposure to covid-19: Secondary | ICD-10-CM | POA: Diagnosis not present

## 2020-05-04 DIAGNOSIS — Z01818 Encounter for other preprocedural examination: Secondary | ICD-10-CM | POA: Insufficient documentation

## 2020-05-04 LAB — SARS CORONAVIRUS 2 (TAT 6-24 HRS): SARS Coronavirus 2: NEGATIVE

## 2020-05-06 ENCOUNTER — Encounter (HOSPITAL_COMMUNITY)
Admission: RE | Disposition: A | Discharge status: Home / Self Care | Payer: Self-pay | Source: Home / Self Care | Attending: Cardiology

## 2020-05-06 ENCOUNTER — Other Ambulatory Visit: Payer: Self-pay

## 2020-05-06 ENCOUNTER — Encounter (HOSPITAL_COMMUNITY): Payer: Self-pay | Admitting: Cardiology

## 2020-05-06 ENCOUNTER — Ambulatory Visit (HOSPITAL_COMMUNITY)
Admission: RE | Admit: 2020-05-06 | Discharge: 2020-05-06 | Disposition: A | Discharge status: Home / Self Care | Payer: PPO | Attending: Cardiology | Admitting: Cardiology

## 2020-05-06 DIAGNOSIS — I480 Paroxysmal atrial fibrillation: Secondary | ICD-10-CM | POA: Insufficient documentation

## 2020-05-06 DIAGNOSIS — Z79899 Other long term (current) drug therapy: Secondary | ICD-10-CM | POA: Diagnosis not present

## 2020-05-06 DIAGNOSIS — I428 Other cardiomyopathies: Secondary | ICD-10-CM | POA: Diagnosis not present

## 2020-05-06 DIAGNOSIS — G4733 Obstructive sleep apnea (adult) (pediatric): Secondary | ICD-10-CM | POA: Insufficient documentation

## 2020-05-06 DIAGNOSIS — Z7901 Long term (current) use of anticoagulants: Secondary | ICD-10-CM | POA: Insufficient documentation

## 2020-05-06 DIAGNOSIS — I2510 Atherosclerotic heart disease of native coronary artery without angina pectoris: Secondary | ICD-10-CM | POA: Diagnosis not present

## 2020-05-06 DIAGNOSIS — I5022 Chronic systolic (congestive) heart failure: Secondary | ICD-10-CM | POA: Diagnosis not present

## 2020-05-06 HISTORY — PX: RIGHT/LEFT HEART CATH AND CORONARY ANGIOGRAPHY: CATH118266

## 2020-05-06 LAB — POCT I-STAT EG7
Acid-Base Excess: 0 mmol/L (ref 0.0–2.0)
Acid-base deficit: 1 mmol/L (ref 0.0–2.0)
Acid-base deficit: 2 mmol/L (ref 0.0–2.0)
Bicarbonate: 25.1 mmol/L (ref 20.0–28.0)
Bicarbonate: 23.8 mmol/L (ref 20.0–28.0)
Bicarbonate: 25.1 mmol/L (ref 20.0–28.0)
Calcium, Ion: 1.23 mmol/L (ref 1.15–1.40)
Calcium, Ion: 1.11 mmol/L — ABNORMAL LOW (ref 1.15–1.40)
Calcium, Ion: 1.16 mmol/L (ref 1.15–1.40)
HCT: 39 % (ref 39.0–52.0)
HCT: 37 % — ABNORMAL LOW (ref 39.0–52.0)
HCT: 38 % — ABNORMAL LOW (ref 39.0–52.0)
Hemoglobin: 13.3 g/dL (ref 13.0–17.0)
Hemoglobin: 12.6 g/dL — ABNORMAL LOW (ref 13.0–17.0)
Hemoglobin: 12.9 g/dL — ABNORMAL LOW (ref 13.0–17.0)
O2 Saturation: 71 %
O2 Saturation: 76 %
O2 Saturation: 77 %
Potassium: 4.1 mmol/L (ref 3.5–5.1)
Potassium: 3.7 mmol/L (ref 3.5–5.1)
Potassium: 3.9 mmol/L (ref 3.5–5.1)
Sodium: 142 mmol/L (ref 135–145)
Sodium: 143 mmol/L (ref 135–145)
Sodium: 144 mmol/L (ref 135–145)
TCO2: 26 mmol/L (ref 22–32)
TCO2: 25 mmol/L (ref 22–32)
TCO2: 26 mmol/L (ref 22–32)
pCO2, Ven: 44 mmHg (ref 44.0–60.0)
pCO2, Ven: 41.7 mmHg — ABNORMAL LOW (ref 44.0–60.0)
pCO2, Ven: 43.2 mmHg — ABNORMAL LOW (ref 44.0–60.0)
pH, Ven: 7.364 (ref 7.250–7.430)
pH, Ven: 7.363 (ref 7.250–7.430)
pH, Ven: 7.372 (ref 7.250–7.430)
pO2, Ven: 39 mmHg (ref 32.0–45.0)
pO2, Ven: 42 mmHg (ref 32.0–45.0)
pO2, Ven: 43 mmHg (ref 32.0–45.0)

## 2020-05-06 SURGERY — RIGHT/LEFT HEART CATH AND CORONARY ANGIOGRAPHY
Anesthesia: LOCAL

## 2020-05-06 MED ORDER — SODIUM CHLORIDE 0.9% FLUSH: 3.0000 mL | INTRAVENOUS | Status: DC | PRN

## 2020-05-06 MED ORDER — VERAPAMIL HCL 2.5 MG/ML IV SOLN
INTRAVENOUS | Status: AC
  Filled 2020-05-06: qty 2

## 2020-05-06 MED ORDER — HYDRALAZINE HCL 20 MG/ML IJ SOLN: 10.0000 mg | INTRAMUSCULAR | Status: DC | PRN

## 2020-05-06 MED ORDER — IOHEXOL 350 MG/ML SOLN
INTRAVENOUS | Status: DC | PRN
  Administered 2020-05-06: 40 mL

## 2020-05-06 MED ORDER — LIDOCAINE HCL (PF) 1 % IJ SOLN
INTRAMUSCULAR | Status: AC
  Filled 2020-05-06: qty 30

## 2020-05-06 MED ORDER — SODIUM CHLORIDE 0.9% FLUSH: 3.0000 mL | Freq: Two times a day (BID) | INTRAVENOUS | Status: DC

## 2020-05-06 MED ORDER — MIDAZOLAM HCL 2 MG/2ML IJ SOLN
INTRAMUSCULAR | Status: DC | PRN
  Administered 2020-05-06: 1 mg via INTRAVENOUS

## 2020-05-06 MED ORDER — HEPARIN SODIUM (PORCINE) 1000 UNIT/ML IJ SOLN
INTRAMUSCULAR | Status: DC | PRN
  Administered 2020-05-06: 5000 [IU] via INTRAVENOUS

## 2020-05-06 MED ORDER — LABETALOL HCL 5 MG/ML IV SOLN: 10.0000 mg | INTRAVENOUS | Status: DC | PRN

## 2020-05-06 MED ORDER — SODIUM CHLORIDE 0.9 % IV SOLN: 250.0000 mL | INTRAVENOUS | Status: DC | PRN

## 2020-05-06 MED ORDER — ACETAMINOPHEN 325 MG PO TABS: 650.0000 mg | ORAL_TABLET | ORAL | Status: DC | PRN

## 2020-05-06 MED ORDER — FENTANYL CITRATE (PF) 100 MCG/2ML IJ SOLN
INTRAMUSCULAR | Status: DC | PRN
  Administered 2020-05-06 (×2): 25 ug via INTRAVENOUS

## 2020-05-06 MED ORDER — HEPARIN (PORCINE) IN NACL 1000-0.9 UT/500ML-% IV SOLN
INTRAVENOUS | Status: DC | PRN
  Administered 2020-05-06 (×2): 500 mL

## 2020-05-06 MED ORDER — FENTANYL CITRATE (PF) 100 MCG/2ML IJ SOLN
INTRAMUSCULAR | Status: AC
  Filled 2020-05-06: qty 2

## 2020-05-06 MED ORDER — ASPIRIN 81 MG PO CHEW
CHEWABLE_TABLET | ORAL | Status: AC
  Administered 2020-05-06: 81 mg via ORAL
  Filled 2020-05-06: qty 1

## 2020-05-06 MED ORDER — VERAPAMIL HCL 2.5 MG/ML IV SOLN
INTRAVENOUS | Status: DC | PRN
  Administered 2020-05-06: 10 mL via INTRA_ARTERIAL

## 2020-05-06 MED ORDER — MIDAZOLAM HCL 2 MG/2ML IJ SOLN
INTRAMUSCULAR | Status: AC
  Filled 2020-05-06: qty 2

## 2020-05-06 MED ORDER — HEPARIN (PORCINE) IN NACL 1000-0.9 UT/500ML-% IV SOLN
INTRAVENOUS | Status: AC
  Filled 2020-05-06: qty 1000

## 2020-05-06 MED ORDER — ONDANSETRON HCL 4 MG/2ML IJ SOLN: 4.0000 mg | Freq: Four times a day (QID) | INTRAMUSCULAR | Status: DC | PRN

## 2020-05-06 MED ORDER — SODIUM CHLORIDE 0.9 % IV SOLN: INTRAVENOUS | Status: DC

## 2020-05-06 MED ORDER — LIDOCAINE HCL (PF) 1 % IJ SOLN
INTRAMUSCULAR | Status: DC | PRN
  Administered 2020-05-06 (×2): 2 mL

## 2020-05-06 MED ORDER — HEPARIN SODIUM (PORCINE) 1000 UNIT/ML IJ SOLN
INTRAMUSCULAR | Status: AC
  Filled 2020-05-06: qty 1

## 2020-05-06 MED ORDER — ASPIRIN 81 MG PO CHEW: 81.0000 mg | CHEWABLE_TABLET | ORAL | Status: AC

## 2020-05-06 SURGICAL SUPPLY — 10 items
CATH 5FR JL3.5 JR4 ANG PIG MP (CATHETERS) ×2 IMPLANT
CATH SWAN GANZ 7F STRAIGHT (CATHETERS) ×2 IMPLANT
DEVICE RAD COMP TR BAND LRG (VASCULAR PRODUCTS) ×2 IMPLANT
GLIDESHEATH SLEND SS 6F .021 (SHEATH) ×2 IMPLANT
GLIDESHEATH SLENDER 7FR .021G (SHEATH) ×2 IMPLANT
GUIDEWIRE INQWIRE 1.5J.035X260 (WIRE) ×1 IMPLANT
INQWIRE 1.5J .035X260CM (WIRE) ×2
KIT HEART LEFT (KITS) ×2 IMPLANT
PACK CARDIAC CATHETERIZATION (CUSTOM PROCEDURE TRAY) ×2 IMPLANT
TRANSDUCER W/STOPCOCK (MISCELLANEOUS) ×2 IMPLANT

## 2020-05-06 NOTE — Discharge Instructions (Signed)
Radial Site Care  This sheet gives you information about how to care for yourself after your procedure. Your health care provider may also give you more specific instructions. If you have problems or questions, contact your health care provider. What can I expect after the procedure? After the procedure, it is common to have:  Bruising and tenderness at the catheter insertion area. Follow these instructions at home: Medicines  Take over-the-counter and prescription medicines only as told by your health care provider. Insertion site care  Follow instructions from your health care provider about how to take care of your insertion site. Make sure you: ? Wash your hands with soap and water before you change your bandage (dressing). If soap and water are not available, use hand sanitizer. ? Change your dressing as told by your health care provider. ? Leave stitches (sutures), skin glue, or adhesive strips in place. These skin closures may need to stay in place for 2 weeks or longer. If adhesive strip edges start to loosen and curl up, you may trim the loose edges. Do not remove adhesive strips completely unless your health care provider tells you to do that.  Check your insertion site every day for signs of infection. Check for: ? Redness, swelling, or pain. ? Fluid or blood. ? Pus or a bad smell. ? Warmth.  Do not take baths, swim, or use a hot tub until your health care provider approves.  You may shower 24-48 hours after the procedure, or as directed by your health care provider. ? Remove the dressing and gently wash the site with plain soap and water. ? Pat the area dry with a clean towel. ? Do not rub the site. That could cause bleeding.  Do not apply powder or lotion to the site. Activity   For 24 hours after the procedure, or as directed by your health care provider: ? Do not flex or bend the affected arm. ? Do not push or pull heavy objects with the affected arm. ? Do not  drive yourself home from the hospital or clinic. You may drive 24 hours after the procedure unless your health care provider tells you not to. ? Do not operate machinery or power tools.  Do not lift anything that is heavier than 10 lb (4.5 kg), or the limit that you are told, until your health care provider says that it is safe.  Ask your health care provider when it is okay to: ? Return to work or school. ? Resume usual physical activities or sports. ? Resume sexual activity. General instructions  If the catheter site starts to bleed, raise your arm and put firm pressure on the site. If the bleeding does not stop, get help right away. This is a medical emergency.  If you went home on the same day as your procedure, a responsible adult should be with you for the first 24 hours after you arrive home.  Keep all follow-up visits as told by your health care provider. This is important. Contact a health care provider if:  You have a fever.  You have redness, swelling, or yellow drainage around your insertion site. Get help right away if:  You have unusual pain at the radial site.  The catheter insertion area swells very fast.  The insertion area is bleeding, and the bleeding does not stop when you hold steady pressure on the area.  Your arm or hand becomes pale, cool, tingly, or numb. These symptoms may represent a serious problem   that is an emergency. Do not wait to see if the symptoms will go away. Get medical help right away. Call your local emergency services (911 in the [place] Do not drive yourself to the hospital. Summary  After the procedure, it is common to have bruising and tenderness at the site.  Follow instructions from your health care provider about how to take care of your radial site wound. Check the wound every day for signs of infection.  Do not lift anything that is heavier than 10 lb (4.5 kg), or the limit that you are told, until your health care provider says  that it is safe. This information is not intended to replace advice given to you by your health care provider. Make sure you discuss any questions you have with your health care provider. Document Revised: 06/28/2017 Document Reviewed: 06/28/2017 Elsevier Patient Education  2020 [employer].    May resume apixaban at 8 pm tonight.

## 2020-05-20 ENCOUNTER — Encounter (HOSPITAL_COMMUNITY): Payer: Self-pay | Admitting: Cardiology

## 2020-05-20 ENCOUNTER — Ambulatory Visit (HOSPITAL_COMMUNITY)
Admission: RE | Admit: 2020-05-20 | Discharge: 2020-05-20 | Disposition: A | Discharge status: Home / Self Care | Payer: PPO | Source: Ambulatory Visit | Attending: Cardiology | Admitting: Cardiology

## 2020-05-20 ENCOUNTER — Other Ambulatory Visit: Payer: Self-pay

## 2020-05-20 VITALS — BP 140/90 | HR 87 | Wt 292.8 lb

## 2020-05-20 DIAGNOSIS — I420 Dilated cardiomyopathy: Secondary | ICD-10-CM | POA: Diagnosis not present

## 2020-05-20 DIAGNOSIS — E785 Hyperlipidemia, unspecified: Secondary | ICD-10-CM | POA: Diagnosis not present

## 2020-05-20 DIAGNOSIS — I429 Cardiomyopathy, unspecified: Secondary | ICD-10-CM | POA: Diagnosis not present

## 2020-05-20 DIAGNOSIS — I5022 Chronic systolic (congestive) heart failure: Secondary | ICD-10-CM | POA: Diagnosis not present

## 2020-05-20 DIAGNOSIS — Z7901 Long term (current) use of anticoagulants: Secondary | ICD-10-CM | POA: Diagnosis not present

## 2020-05-20 DIAGNOSIS — I951 Orthostatic hypotension: Secondary | ICD-10-CM | POA: Insufficient documentation

## 2020-05-20 DIAGNOSIS — Z79899 Other long term (current) drug therapy: Secondary | ICD-10-CM | POA: Insufficient documentation

## 2020-05-20 DIAGNOSIS — Z8673 Personal history of transient ischemic attack (TIA), and cerebral infarction without residual deficits: Secondary | ICD-10-CM | POA: Diagnosis not present

## 2020-05-20 DIAGNOSIS — I480 Paroxysmal atrial fibrillation: Secondary | ICD-10-CM | POA: Insufficient documentation

## 2020-05-20 DIAGNOSIS — I2510 Atherosclerotic heart disease of native coronary artery without angina pectoris: Secondary | ICD-10-CM | POA: Diagnosis not present

## 2020-05-20 DIAGNOSIS — G4733 Obstructive sleep apnea (adult) (pediatric): Secondary | ICD-10-CM | POA: Insufficient documentation

## 2020-05-20 LAB — BASIC METABOLIC PANEL
Anion gap: 8 (ref 5–15)
BUN: 15 mg/dL (ref 8–23)
CO2: 25 mmol/L (ref 22–32)
Calcium: 8.8 mg/dL — ABNORMAL LOW (ref 8.9–10.3)
Chloride: 105 mmol/L (ref 98–111)
Creatinine, Ser: 1.06 mg/dL (ref 0.61–1.24)
GFR, Estimated: >60 mL/min (ref >60)
Glucose, Bld: 96 mg/dL (ref 70–99)
Potassium: 4.6 mmol/L (ref 3.5–5.1)
Sodium: 138 mmol/L (ref 135–145)

## 2020-05-20 LAB — LIPID PANEL
Cholesterol: 134 mg/dL (ref 0–200)
HDL: 45 mg/dL (ref >40)
LDL Cholesterol: 66 mg/dL (ref 0–99)
Total CHOL/HDL Ratio: 3 RATIO
Triglycerides: 117 mg/dL (ref <150)
VLDL: 23 mg/dL (ref 0–40)

## 2020-05-20 MED ORDER — LOSARTAN POTASSIUM 25 MG PO TABS: 12.5000 mg | ORAL_TABLET | Freq: Every day | ORAL | 3 refills | Status: DC

## 2020-05-20 NOTE — Patient Instructions (Addendum)
START Losartan 12.5mg  (1/2 tablet) Daily  Labs done today, your results will be available in MyChart, we will contact you for abnormal readings.  Your physician recommends that you schedule repeat labs in 10 days  Your physician recommends that you schedule a follow-up appointment in: 3 weeks with Pharmacy  Your physician recommends that you schedule a follow-up appointment in: 6 weeks with Dr. Aundra Dubin  If you have any questions or concerns before your next appointment please send [place] a message through [facility] or call our office at [phone].    TO LEAVE A MESSAGE FOR THE NURSE SELECT OPTION 2, PLEASE LEAVE A MESSAGE INCLUDING: . YOUR NAME . DATE OF BIRTH . CALL BACK NUMBER . REASON FOR CALL**this is important as we prioritize the call backs  YOU WILL RECEIVE A CALL BACK THE SAME DAY AS LONG AS YOU CALL BEFORE 4:00 PM

## 2020-05-21 DIAGNOSIS — E7800 Pure hypercholesterolemia, unspecified: Secondary | ICD-10-CM | POA: Diagnosis not present

## 2020-05-21 DIAGNOSIS — I4891 Unspecified atrial fibrillation: Secondary | ICD-10-CM | POA: Diagnosis not present

## 2020-05-21 DIAGNOSIS — M179 Osteoarthritis of knee, unspecified: Secondary | ICD-10-CM | POA: Diagnosis not present

## 2020-05-21 DIAGNOSIS — I639 Cerebral infarction, unspecified: Secondary | ICD-10-CM | POA: Diagnosis not present

## 2020-05-21 DIAGNOSIS — D649 Anemia, unspecified: Secondary | ICD-10-CM | POA: Diagnosis not present

## 2020-05-21 DIAGNOSIS — N401 Enlarged prostate with lower urinary tract symptoms: Secondary | ICD-10-CM | POA: Diagnosis not present

## 2020-05-21 NOTE — Progress Notes (Signed)
PCP: Lawerance Cruel, MD Cardiology: Dr. Radford Pax HF Cardiology: Dr. Aundra Dubin  74 y.o. with history of chronic systolic CHF and paroxysmal atrial fibrillation was referred by Dr. Radford Pax for evaluation of CHF.  Patient has been short of breath for years, worse over the last 6 months.  He has had an extensive workup.  Echo in 3/21 showed EF 45-50% with mildly decreased RV function.  Cardiolite in 4/21 showed fixed apical defect.  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  PYP scan in 8/21 was not suggestive of TTR cardiac amyloidosis.   He had been in persistent atrial fibrillation for about 2 years.  He saw Dr. Quentin Ore and was started on amiodarone.  DCCV was done in 10/21 back to NSR. He is in NSR today.  He was found to have M-spike on SPEP.  He saw Dr. Marin Olp who thought this was MGUS.   LHC/RHC in 12/21 showed occluded small OM2 and 80-90% stenosis mid PLOM (small caliber), no interventional target; normal filling pressures and preserved cardiac output.   Patient returns for followup of CHF.  He was unable to tolerate Jardiance due to extreme dizziness.  Currently doing pretty well, dyspneic only with stairs or walking long distances.  No orthopnea/PND.  He is in NSR today.  No chest pain. Using CPAP.  Weight is down 2 lbs.   Labs (7/21): Urine immunofixation with monoclonal IgG protein, SPEP with M-spike.   Labs (10/21): TSH normal, K 4, creatinine 0.97, LFTs normal Labs (11/21): K 4.8, creatinine 1.07, hgb 14.7, LFTs normal, TSH normal  PMH: 1. CVA: Occurred in 3/21, he was not on apixaban at the time.  2. GERD 3. OSA: Uses CPAP.  4. Hyperlipidemia 5. Atrial fibrillation: Paroxysmal.  - DCCV to NSR in 10/21.  6. MGUS 7. Chronic systolic CHF: Echo (0/03) with EF 45-50%, mild LVH, mildly decreased RV systolic function. Primarily nonischemic cardiomyopathy.  - Cardiolite (4/21): Fixed apical defect.  -  Cardiac MRI (9/21): Mild LV dilation with mild LV hypertrophy, EF 39%, prominent trabeculations concerning for noncompaction. Mild RV dilation with EF 28%, severe LAE.  No delayed enhancement.  - PYP scan (8/21): grade 1, H/CL 1-1.5.  Unlikely to have cardiac amyloidosis.  - LHC/RHC (12/21): small OM2 totally occluded, 80-90% mid small PLOM (no interventional target); mean RA 5, PA 25/9, mean PCWP 5, CI 2.77 Fick/3.29 Thermo.  8. Orthostatic hypotension.  9. CAD: Coronary angiography 12/21 with small OM2 totally occluded, 80-90% mid small PLOM (no interventional target)  Social History   Socioeconomic History  . Marital status: Married    Spouse name: Not on file  . Number of children: Not on file  . Years of education: Not on file  . Highest education level: Not on file  Occupational History  . Not on file  Tobacco Use  . Smoking status: Never Smoker  . Smokeless tobacco: Never Used  Vaping Use  . Vaping Use: Never used  Substance and Sexual Activity  . Alcohol use: Yes    Alcohol/week: 1.0 standard drink    Types: 1 Cans of beer per week    Comment: Rare  . Drug use: No  . Sexual activity: Not Currently  Other Topics Concern  . Not on file  Social History Narrative  . Not on file   Social Determinants of Health   Financial Resource Strain: Not on file  Food Insecurity: Not on file  Transportation Needs: Not on file  Physical Activity: Not on file  Stress: Not on file  Social Connections: Not on file  Intimate Partner Violence: Not on file   Family History  Problem Relation Age of Onset  . Lung cancer Mother        smoker   ROS: All systems reviewed and negative except as per HPI.   Current Outpatient Medications  Medication Sig Dispense Refill  . amiodarone (PACERONE) 200 MG tablet Take 200 mg by mouth daily.    Marland Kitchen apixaban (ELIQUIS) 5 MG TABS tablet Take 1 tablet (5 mg total) by mouth 2 (two) times daily. 60 tablet 0  . brimonidine (ALPHAGAN) 0.15 % ophthalmic  solution Place 1 drop into the right eye 2 (two) times daily.    . cetirizine (ZYRTEC) 10 MG tablet Take 10 mg by mouth every evening.    . Cholecalciferol (VITAMIN D3) 50 MCG (2000 UT) TABS Take 2,000 Units by mouth at bedtime.     . Evolocumab (REPATHA SURECLICK) 384 MG/ML SOAJ Inject 1 pen into the skin every 14 (fourteen) days. (Patient taking differently: Inject 140 mg into the skin every 14 (fourteen) days.) 2 pen 11  . ferrous sulfate 325 (65 FE) MG tablet Take 325 mg by mouth daily with breakfast.    . finasteride (PROSCAR) 5 MG tablet Take 5 mg by mouth daily.    . fluticasone (FLONASE) 50 MCG/ACT nasal spray Place 2 sprays into both nostrils 2 (two) times daily.     . Glucosamine 750 MG TABS Take 750 mg by mouth in the morning and at bedtime.     . Melatonin 10 MG TABS Take 10 mg by mouth at bedtime.     . Multiple Vitamins-Minerals (CENTRUM MEN PO) Take 1 tablet by mouth daily.    . rosuvastatin (CRESTOR) 5 MG tablet Take 5 mg by mouth at bedtime.     . sildenafil (REVATIO) 20 MG tablet Take 40-100 mg by mouth daily as needed (erectile dysfunction.).   5  . spironolactone (ALDACTONE) 25 MG tablet Take 0.5 tablets (12.5 mg total) by mouth at bedtime. 15 tablet 3  . tamsulosin (FLOMAX) 0.4 MG CAPS capsule Take 0.4 mg by mouth at bedtime.     Marland Kitchen losartan (COZAAR) 25 MG tablet Take 0.5 tablets (12.5 mg total) by mouth daily. 15 tablet 3   No current facility-administered medications for this encounter.   BP 140/90   Pulse 87   Wt 132.8 kg (292 lb 12.8 oz)   SpO2 98%   BMI 39.71 kg/m  General: NAD Neck: No JVD, no thyromegaly or thyroid nodule.  Lungs: Clear to auscultation bilaterally with normal respiratory effort. CV: Nondisplaced PMI.  Heart regular S1/S2, no S3/S4, no murmur.  No peripheral edema.  No carotid bruit.  Normal pedal pulses.  Abdomen: Soft, nontender, no hepatosplenomegaly, no distention.  Skin: Intact without lesions or rashes.  Neurologic: Alert and oriented x  3.  Psych: Normal affect. Extremities: No clubbing or cyanosis.  HEENT: Normal.   Assessment/Plan: 1. Chronic systolic CHF: Cardiac MRI in 9/21 with EF 39% and evidence for noncompaction cardiomyopathy, no delayed enhancement, significant RV dysfunction with RV EF 22%.  PYP scan was not suggestive of TTR amyloidosis.  12/21 LHC showed CAD but does not explain cardiomyopathy, no interventional target.  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  On exam, he is not volume overloaded.  REDS clip 32%.  NYHA class II symptoms, improvement in NSR.  He has a history of orthostatic hypotension which has limited medication titration but this is better in NSR as well.  Will try to gently titrate up HF meds.  - He did not tolerate Jardiance due to dizziness.  - Continue spironolactone 12.5 mg daily.  - Add losartan 12.5 mg qhs.  BMET today and in 10 days.  2. Atrial fibrillation: Paroxysmal.  He remains in NSR after DCCV in 10/21.  He is at significant risk for recurrent atrial fibrillation given severe LAE.  - Continue amiodarone 200 mg daily, recent LFTs and TSH normal.  He will need regular eye exam.  - Continue apixaban 5 mg bid.  - Consider ablation in the future => seeing Dr Quentin Ore.   3. OSA: Continue CPAP.  4. Orthostatic hypotension: Not symptomatic since DCCV.   - Continue compression stockings and follow over time.  This may limit cardiac med initiation.  5. CAD: Moderate disease on cath in 12/21, no intervention target.  No chest pain.  Extent of disease does not explain cardiomyopathy.   - Continue Crestor and Repatha, check lipids. - He is on apixaban with stable CAD, so no ASA.   Followup with HF pharmacist in 3 wks and me in 6 wks.   Loralie Champagne 05/21/2020

## 2020-05-22 ENCOUNTER — Encounter (HOSPITAL_COMMUNITY): Payer: PPO | Admitting: Cardiology

## 2020-05-25 NOTE — Progress Notes (Signed)
PCP: Lawerance Cruel, MD Cardiology: Dr. Radford Pax HF Cardiology: Dr. Aundra Dubin  HPI:  74 y.o. with history of chronic systolic CHF and paroxysmal atrial fibrillation was referred by Dr. Radford Pax for evaluation of CHF.  Patient had been short of breath for years, worse over the last 6 months.  He has had an extensive workup.  Echo in 3/21 showed EF 45-50% with mildly decreased RV function.  Cardiolite in 4/21 showed fixed apical defect.  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  PYP scan in 8/21 was not suggestive of TTR cardiac amyloidosis.   He had been in persistent atrial fibrillation for about 2 years.  He saw Dr. Quentin Ore and was started on amiodarone.  DCCV was done in 10/21 back to NSR. He was found to have M-spike on SPEP.  He saw Dr. Marin Olp who thought this was MGUS.   LHC/RHC in 12/21 showed occluded small OM2 and 80-90% stenosis mid PLOM (small caliber), no interventional target; normal filling pressures and preserved cardiac output.   Patient recently returned to [facility] for follow up on 05/20/20.  He was unable to tolerate Jardiance due to extreme dizziness.  Was doing pretty well, dyspneic only with stairs or walking long distances.  No orthopnea/PND.  He was in NSR in clinic.  No chest pain. Using CPAP.  Weight was down 2 lbs.   Today he returns to HF clinic for pharmacist medication titration. At last visit with MD losartan 12.5 mg daily was initiated. Overall he is feeling well today. Has occasional dizziness in the morning, which is worse upon standing. This is not new. Notes palpitations that occur several times per week, but do not last long. Can walk 1/2 mile on the treadmill before getting SOB. Gets SOB going up inclines or upstairs. Weight has been stable. No LEE, PND or orthopnea. Taking all medications as prescribed and tolerating all medications.     HF Medications: Losartan 12.5 mg  daily Spironolactone 12.5 mg daily  Has the patient been experiencing any side effects to the medications prescribed?  no  Does the patient have any problems obtaining medications due to transportation or finances?   No - [employer]  Understanding of regimen: good Understanding of indications: good Potential of compliance: good Patient understands to avoid NSAIDs. Patient understands to avoid decongestants.    Pertinent Lab Values (06/01/20): Marland Kitchen Serum creatinine 1.01, BUN 12, Potassium 3.9, Sodium 139  Vital Signs: . Weight: 295 lbs (last clinic weight: 292 lbs) . Blood pressure: 108/60  . Heart rate: 81   Assessment: 1. Chronic systolic CHF: Cardiac MRI in 9/21 with EF 39% and evidence for noncompaction cardiomyopathy, no delayed enhancement, significant RV dysfunction with RV EF 22%.  PYP scan was not suggestive of TTR amyloidosis.  12/21 LHC showed CAD but does not explain cardiomyopathy, no interventional target.  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  - On exam, he is not volume overloaded.   NYHA class II symptoms, improvement in NSR.   - He has a history of orthostatic hypotension which has limited medication titration but this is better in NSR as well.  Will try to gently titrate up HF meds.  - Continue losartan 12.5 mg every evening  - Increase spironolactone to 25 mg daily.  Repeat BMET in 10-14 days.  - He did not tolerate Jardiance due to dizziness 2. Atrial fibrillation: Paroxysmal.  He  remains in NSR after DCCV in 10/21.  He is at significant risk for recurrent atrial fibrillation given severe LAE.  - Continue amiodarone 200 mg daily, recent LFTs and TSH normal.  He will need regular eye exam.  - Continue apixaban 5 mg bid.  - Consider ablation in the future => seeing Dr Quentin Ore.   3. OSA: Continue CPAP.  4. Orthostatic hypotension: Not symptomatic since DCCV.   - Continue compression stockings and follow over time.   This may limit cardiac med initiation.  5. CAD: Moderate disease on cath in 12/21, no intervention target.  No chest pain.  Extent of disease does not explain cardiomyopathy.   - Continue Crestor and Repatha, check lipids. - He is on apixaban with stable CAD, so no ASA.    Plan: 1) Medication changes: Based on clinical presentation, vital signs and recent labs will increase spironolactone to 25 mg daily 2) Follow-up: 1 month with Dr. Rush Farmer, PharmD, BCPS, [facility], [place] [facility] Pharmacist [phone]

## 2020-05-28 DIAGNOSIS — G4733 Obstructive sleep apnea (adult) (pediatric): Secondary | ICD-10-CM | POA: Diagnosis not present

## 2020-06-01 ENCOUNTER — Ambulatory Visit (HOSPITAL_COMMUNITY)
Admission: RE | Admit: 2020-06-01 | Discharge: 2020-06-01 | Disposition: A | Discharge status: Home / Self Care | Payer: PPO | Source: Ambulatory Visit | Attending: Cardiology | Admitting: Cardiology

## 2020-06-01 ENCOUNTER — Other Ambulatory Visit: Payer: Self-pay

## 2020-06-01 DIAGNOSIS — I420 Dilated cardiomyopathy: Secondary | ICD-10-CM | POA: Insufficient documentation

## 2020-06-01 LAB — BASIC METABOLIC PANEL
Anion gap: 8 (ref 5–15)
BUN: 12 mg/dL (ref 8–23)
CO2: 24 mmol/L (ref 22–32)
Calcium: 8.5 mg/dL — ABNORMAL LOW (ref 8.9–10.3)
Chloride: 107 mmol/L (ref 98–111)
Creatinine, Ser: 1.01 mg/dL (ref 0.61–1.24)
GFR, Estimated: >60 mL/min (ref >60)
Glucose, Bld: 119 mg/dL — ABNORMAL HIGH (ref 70–99)
Potassium: 3.9 mmol/L (ref 3.5–5.1)
Sodium: 139 mmol/L (ref 135–145)

## 2020-06-09 ENCOUNTER — Ambulatory Visit: Payer: PPO | Admitting: Cardiology

## 2020-06-09 ENCOUNTER — Encounter: Payer: Self-pay | Admitting: Cardiology

## 2020-06-09 ENCOUNTER — Other Ambulatory Visit: Payer: Self-pay

## 2020-06-09 VITALS — BP 112/72 | HR 67 | Ht 72.0 in | Wt 295.0 lb

## 2020-06-09 DIAGNOSIS — Z79899 Other long term (current) drug therapy: Secondary | ICD-10-CM | POA: Diagnosis not present

## 2020-06-09 DIAGNOSIS — I472 Ventricular tachycardia: Secondary | ICD-10-CM | POA: Diagnosis not present

## 2020-06-09 DIAGNOSIS — I428 Other cardiomyopathies: Secondary | ICD-10-CM | POA: Diagnosis not present

## 2020-06-09 DIAGNOSIS — I4819 Other persistent atrial fibrillation: Secondary | ICD-10-CM

## 2020-06-09 DIAGNOSIS — I5022 Chronic systolic (congestive) heart failure: Secondary | ICD-10-CM

## 2020-06-09 DIAGNOSIS — I4729 Other ventricular tachycardia: Secondary | ICD-10-CM

## 2020-06-09 NOTE — Progress Notes (Signed)
Electrophysiology Office Follow up Visit Note:    Date:  06/09/2020   ID:  Joshua Moran., DOB December 10, 1945, MRN [id number]  PCP:  Lawerance Cruel, MD  [facility] Cardiologist:  [name], MD  [facility] Electrophysiologist:  Vickie Epley, MD    Interval History:    Joshua Moran. is a 75 y.o. male who presents for a follow up visit. They were last seen in clinic March 10, 2020 for persistent atrial fibrillation.  At that appointment, he was loaded on amiodarone and scheduled for a cardioversion.  He underwent a successful cardioversion on April 01, 2020.  He last saw Dr. Aundra Dubin on May 20, 2020.  At that appointment he was maintaining normal rhythm.    Past Medical History:  Diagnosis Date  . Abnormal screening CT of chest    coronary calcium in left main and RCA  . Anemia   . Arthritis   . CHF (congestive heart failure) ([place])   . Complication of anesthesia    Pt reports difficult urinating  . Coronary artery calcification seen on CAT scan    no ischemic on nuclear stress test 09/2019  . DCM (dilated cardiomyopathy) ([place])    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  Cardiac MRI 2021 with EF 39% and moderate RV dysfunction and non-compaction  . Dyspnea    with some activity  . GERD (gastroesophageal reflux disease)    occasional  . Glaucoma   . Hyperlipidemia    statin intolerant at doses > Crestor 5mg  3 times weekly  . OSA on CPAP   . PAF (paroxysmal atrial fibrillation) ([facility])    CHADS2VASC score 2 on apixaban    Past Surgical History:  Procedure Laterality Date  . CARDIOVERSION N/A 04/01/2020   Procedure: CARDIOVERSION;  Surgeon: Skeet Latch, MD;  Location: [facility];  Service: Cardiovascular;  Laterality: N/A;  . EYE SURGERY     bilateral cataracts  . KNEE ARTHROSCOPY W/ DEBRIDEMENT     left   . RETINAL DETACHMENT SURGERY Bilateral   . RIGHT/LEFT HEART CATH AND CORONARY ANGIOGRAPHY N/A 05/06/2020   Procedure: RIGHT/LEFT HEART CATH AND  CORONARY ANGIOGRAPHY;  Surgeon: Larey Dresser, MD;  Location: [facility];  Service: Cardiovascular;  Laterality: N/A;  . TOTAL KNEE ARTHROPLASTY Left 01/01/2018   Procedure: LEFT TOTAL KNEE ARTHROPLASTY;  Surgeon: Gaynelle Arabian, MD;  Location: [facility];  Service: Orthopedics;  Laterality: Left;  50 mins  . TOTAL KNEE ARTHROPLASTY Right 06/25/2018   Procedure: TOTAL KNEE ARTHROPLASTY;  Surgeon: Gaynelle Arabian, MD;  Location: [facility];  Service: Orthopedics;  Laterality: Right;  59min  . VASECTOMY      Current Medications: Current Meds  Medication Sig  . amiodarone (PACERONE) 200 MG tablet Take 200 mg by mouth daily.  Marland Kitchen apixaban (ELIQUIS) 5 MG TABS tablet Take 1 tablet (5 mg total) by mouth 2 (two) times daily.  . brimonidine (ALPHAGAN) 0.15 % ophthalmic solution Place 1 drop into the right eye 2 (two) times daily.  . cetirizine (ZYRTEC) 10 MG tablet Take 10 mg by mouth every evening.  . Cholecalciferol (VITAMIN D3) 50 MCG (2000 UT) TABS Take 2,000 Units by mouth at bedtime.   . Evolocumab (REPATHA SURECLICK) XX123456 MG/ML SOAJ Inject 1 pen into the skin every 14 (fourteen) days. (Patient taking differently: Inject 140 mg into the skin every 14 (fourteen) days.)  . ferrous sulfate 325 (65 FE) MG tablet Take 325 mg by mouth daily with breakfast.  . finasteride (  PROSCAR) 5 MG tablet Take 5 mg by mouth daily.  . fluticasone (FLONASE) 50 MCG/ACT nasal spray Place 2 sprays into both nostrils 2 (two) times daily.   . Glucosamine 750 MG TABS Take 750 mg by mouth in the morning and at bedtime.   Marland Kitchen losartan (COZAAR) 25 MG tablet Take 0.5 tablets (12.5 mg total) by mouth daily.  . Melatonin 10 MG TABS Take 10 mg by mouth at bedtime.   . Multiple Vitamins-Minerals (CENTRUM MEN PO) Take 1 tablet by mouth daily.  . rosuvastatin (CRESTOR) 5 MG tablet Take 5 mg by mouth at bedtime.   . sildenafil (REVATIO) 20 MG tablet Take 40-100 mg by mouth daily as needed (erectile dysfunction.).   Marland Kitchen spironolactone  (ALDACTONE) 25 MG tablet Take 0.5 tablets (12.5 mg total) by mouth at bedtime.  . tamsulosin (FLOMAX) 0.4 MG CAPS capsule Take 0.4 mg by mouth at bedtime.      Allergies:   Xarelto [rivaroxaban], Penicillins, Statins, and Vancomycin   Social History   Socioeconomic History  . Marital status: Married    Spouse name: Not on file  . Number of children: Not on file  . Years of education: Not on file  . Highest education level: Not on file  Occupational History  . Not on file  Tobacco Use  . Smoking status: Never Smoker  . Smokeless tobacco: Never Used  Vaping Use  . Vaping Use: Never used  Substance and Sexual Activity  . Alcohol use: Yes    Alcohol/week: 1.0 standard drink    Types: 1 Cans of beer per week    Comment: Rare  . Drug use: No  . Sexual activity: Not Currently  Other Topics Concern  . Not on file  Social History Narrative  . Not on file   Social Determinants of Health   Financial Resource Strain: Not on file  Food Insecurity: Not on file  Transportation Needs: Not on file  Physical Activity: Not on file  Stress: Not on file  Social Connections: Not on file     Family History: The patient's family history includes Lung cancer in his mother.  ROS:   Please see the history of present illness.    All other systems reviewed and are negative.  EKGs/Labs/Other Studies Reviewed:    The following studies were reviewed today: Prior notes  EKG:  The ekg ordered today demonstrates normal sinus rhythm  Recent Labs: 08/07/2019: Magnesium 2.0 03/10/2020: TSH 2.410 04/27/2020: ALT 27; Platelets 240 05/06/2020: Hemoglobin 12.9 06/01/2020: BUN 12; Creatinine, Ser 1.01; Potassium 3.9; Sodium 139  Recent Lipid Panel    Component Value Date/Time   CHOL 134 05/20/2020 1632   CHOL 127 11/25/2019 1401   TRIG 117 05/20/2020 1632   HDL 45 05/20/2020 1632   HDL 49 11/25/2019 1401   CHOLHDL 3.0 05/20/2020 1632   VLDL 23 05/20/2020 1632   LDLCALC 66 05/20/2020 1632    LDLCALC 60 11/25/2019 1401   LDLDIRECT 62 11/25/2019 1401    Physical Exam:    VS:  BP 112/72   Pulse 67   Ht 6' (1.829 m)   Wt 295 lb (133.8 kg)   BMI 40.01 kg/m     Wt Readings from Last 3 Encounters:  06/09/20 295 lb (133.8 kg)  05/20/20 292 lb 12.8 oz (132.8 kg)  05/06/20 280 lb (127 kg)     GEN:  Well nourished, well developed in no acute distress HEENT: Normal NECK: No JVD; No carotid bruits LYMPHATICS: No  lymphadenopathy CARDIAC: RRR, no murmurs, rubs, gallops RESPIRATORY:  Clear to auscultation without rales, wheezing or rhonchi  ABDOMEN: Soft, non-tender, non-distended MUSCULOSKELETAL:  No edema; No deformity  SKIN: Warm and dry NEUROLOGIC:  Alert and oriented x 3 PSYCHIATRIC:  Normal affect   ASSESSMENT:    1. Persistent atrial fibrillation ([place])   2. Nonischemic cardiomyopathy ([place])   3. Chronic systolic CHF (congestive heart failure) ([place])   4. Ventricular tachycardia, non-sustained ([place])   5. Medication management    PLAN:    In order of problems listed above:  1. Persistent atrial fibrillation Maintaining normal rhythm on amiodarone 200 mg daily.  On Eliquis for stroke prophylaxis.  Not a good candidate for ablation given comorbidities including left ventricular noncompaction and BMI. Had liver and thyroid blood tests in October 2021 and were within normal limits. PFTs were performed in June 2021.  We will plan to repeat in June 2022.  2.  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 Aundra Dubin in the heart failure clinic  3.  Nonsustained ventricular tachycardia Continue amiodarone  Follow-up 6 months with PFTs and repeat thyroid/liver studies.  Medication Adjustments/Labs and Tests Ordered: Current medicines are reviewed at length with the patient today.  Concerns regarding medicines are outlined above.  Orders Placed This Encounter  Procedures  . TSH  . Comprehensive  metabolic panel  . EKG 12-Lead  . Pulmonary Function Test   No orders of the defined types were placed in this encounter.    Signed, Lars Mage, MD, [facility]  06/09/2020 6:44 PM    Electrophysiology [place]

## 2020-06-09 NOTE — Patient Instructions (Signed)
Medication Instructions:  Your physician recommends that you continue on your current medications as directed. Please refer to the Current Medication list given to you today.  *If you need a refill on your cardiac medications before your next appointment, please call your pharmacy*   Lab Work: CMET and TSH 11/2020 - orders have been placed.  If you have labs (blood work) drawn today and your tests are completely normal, you will receive your results only by: Marland Kitchen MyChart Message (if you have MyChart) OR . A paper copy in the mail If you have any lab test that is abnormal or we need to change your treatment, we will call you to review the results.   Testing/Procedures: Your physician has recommended that you have a pulmonary function test. Pulmonary Function Tests are a group of tests that measure how well air moves in and out of your lungs.    Follow-Up: At [facility], you and your health needs are our priority.  As part of our continuing mission to provide you with exceptional heart care, we have created designated Provider Care Teams.  These Care Teams include your primary Cardiologist (physician) and Advanced Practice Providers (APPs -  Physician Assistants and Nurse Practitioners) who all work together to provide you with the care you need, when you need it.  We recommend signing up for the patient portal called "MyChart".  Sign up information is provided on this After Visit Summary.  MyChart is used to connect with patients for Virtual Visits (Telemedicine).  Patients are able to view lab/test results, encounter notes, upcoming appointments, etc.  Non-urgent messages can be sent to your provider as well.   To learn more about what you can do with MyChart, go to [email or web address].    Your next appointment:   11/2020 with Dr Lalla Brothers

## 2020-06-10 ENCOUNTER — Ambulatory Visit (HOSPITAL_COMMUNITY)
Admission: RE | Admit: 2020-06-10 | Discharge: 2020-06-10 | Disposition: A | Discharge status: Home / Self Care | Payer: PPO | Source: Ambulatory Visit | Attending: Internal Medicine | Admitting: Internal Medicine

## 2020-06-10 VITALS — BP 108/60 | HR 81 | Wt 295.0 lb

## 2020-06-10 DIAGNOSIS — I429 Cardiomyopathy, unspecified: Secondary | ICD-10-CM | POA: Insufficient documentation

## 2020-06-10 DIAGNOSIS — I5022 Chronic systolic (congestive) heart failure: Secondary | ICD-10-CM

## 2020-06-10 DIAGNOSIS — I2510 Atherosclerotic heart disease of native coronary artery without angina pectoris: Secondary | ICD-10-CM | POA: Insufficient documentation

## 2020-06-10 DIAGNOSIS — I480 Paroxysmal atrial fibrillation: Secondary | ICD-10-CM | POA: Insufficient documentation

## 2020-06-10 DIAGNOSIS — G4733 Obstructive sleep apnea (adult) (pediatric): Secondary | ICD-10-CM | POA: Diagnosis not present

## 2020-06-10 DIAGNOSIS — Z79899 Other long term (current) drug therapy: Secondary | ICD-10-CM | POA: Diagnosis not present

## 2020-06-10 DIAGNOSIS — I951 Orthostatic hypotension: Secondary | ICD-10-CM | POA: Insufficient documentation

## 2020-06-10 DIAGNOSIS — Z7901 Long term (current) use of anticoagulants: Secondary | ICD-10-CM | POA: Insufficient documentation

## 2020-06-10 MED ORDER — SPIRONOLACTONE 25 MG PO TABS
25.0000 mg | ORAL_TABLET | Freq: Every day | ORAL | 3 refills | Status: DC
Start: 2020-06-10 — End: 2021-05-11

## 2020-06-10 NOTE — Patient Instructions (Signed)
It was a pleasure seeing you today! ° °MEDICATIONS: °-We are changing your medications today °-Increase spironolactone to 25 mg (1 tablet) daily. °-Call if you have questions about your medications. ° ° °NEXT APPOINTMENT: °Return to clinic in 1 month with Dr. McLean. ° °In general, to take care of your heart failure: °-Limit your fluid intake to 2 Liters (half-gallon) per day.   °-Limit your salt intake to ideally 2-3 grams (2000-3000 mg) per day. °-Weigh yourself daily and record, and bring that "weight diary" to your next appointment.  (Weight gain of 2-3 pounds in 1 day typically means fluid weight.) °-The medications for your heart are to help your heart and help you live longer.   °-Please contact us before stopping any of your heart medications. ° °Call the clinic at [phone] with questions or to reschedule future appointments.  °

## 2020-06-17 DIAGNOSIS — E7800 Pure hypercholesterolemia, unspecified: Secondary | ICD-10-CM | POA: Diagnosis not present

## 2020-06-17 DIAGNOSIS — I5022 Chronic systolic (congestive) heart failure: Secondary | ICD-10-CM | POA: Diagnosis not present

## 2020-06-17 DIAGNOSIS — M179 Osteoarthritis of knee, unspecified: Secondary | ICD-10-CM | POA: Diagnosis not present

## 2020-06-17 DIAGNOSIS — I639 Cerebral infarction, unspecified: Secondary | ICD-10-CM | POA: Diagnosis not present

## 2020-06-17 DIAGNOSIS — D649 Anemia, unspecified: Secondary | ICD-10-CM | POA: Diagnosis not present

## 2020-06-17 DIAGNOSIS — N401 Enlarged prostate with lower urinary tract symptoms: Secondary | ICD-10-CM | POA: Diagnosis not present

## 2020-06-17 DIAGNOSIS — I4891 Unspecified atrial fibrillation: Secondary | ICD-10-CM | POA: Diagnosis not present

## 2020-06-18 ENCOUNTER — Other Ambulatory Visit: Payer: Self-pay

## 2020-06-18 ENCOUNTER — Ambulatory Visit (INDEPENDENT_AMBULATORY_CARE_PROVIDER_SITE_OTHER): Payer: PPO | Admitting: Internal Medicine

## 2020-06-18 DIAGNOSIS — I4819 Other persistent atrial fibrillation: Secondary | ICD-10-CM

## 2020-06-18 LAB — PULMONARY FUNCTION TEST
DL/VA % pred: 91 %
DL/VA: 3.61 ml/min/mmHg/L
DLCO cor % pred: 98 %
DLCO cor: 26.4 ml/min/mmHg
DLCO unc % pred: 98 %
DLCO unc: 26.4 ml/min/mmHg
FEF 25-75 Post: 3.9 L/sec
FEF 25-75 Pre: 3.13 L/sec
FEF2575-%Change-Post: 24 %
FEF2575-%Pred-Post: 161 %
FEF2575-%Pred-Pre: 129 %
FEV1-%Change-Post: 7 %
FEV1-%Pred-Post: 118 %
FEV1-%Pred-Pre: 110 %
FEV1-Post: 3.94 L
FEV1-Pre: 3.68 L
FEV1FVC-%Change-Post: 2 %
FEV1FVC-%Pred-Pre: 105 %
FEV6-%Change-Post: 4 %
FEV6-%Pred-Post: 116 %
FEV6-%Pred-Pre: 111 %
FEV6-Post: 5.02 L
FEV6-Pre: 4.82 L
FEV6FVC-%Change-Post: 0 %
FEV6FVC-%Pred-Post: 106 %
FEV6FVC-%Pred-Pre: 106 %
FVC-%Change-Post: 3 %
FVC-%Pred-Post: 109 %
FVC-%Pred-Pre: 105 %
FVC-Post: 5.02 L
FVC-Pre: 4.83 L
Post FEV1/FVC ratio: 78 %
Post FEV6/FVC ratio: 100 %
Pre FEV1/FVC ratio: 76 %
Pre FEV6/FVC Ratio: 100 %
RV % pred: 14 %
RV: 0.39 L
TLC % pred: 70 %
TLC: 5.26 L

## 2020-06-18 NOTE — Progress Notes (Signed)
Full PFT performed today. °

## 2020-06-22 ENCOUNTER — Other Ambulatory Visit (HOSPITAL_COMMUNITY): Payer: PPO

## 2020-06-24 ENCOUNTER — Other Ambulatory Visit (HOSPITAL_COMMUNITY): Payer: Self-pay

## 2020-06-24 DIAGNOSIS — I5022 Chronic systolic (congestive) heart failure: Secondary | ICD-10-CM

## 2020-06-25 ENCOUNTER — Ambulatory Visit (HOSPITAL_COMMUNITY)
Admission: RE | Admit: 2020-06-25 | Discharge: 2020-06-25 | Disposition: A | Discharge status: Home / Self Care | Payer: PPO | Source: Ambulatory Visit | Attending: Cardiology | Admitting: Cardiology

## 2020-06-25 ENCOUNTER — Other Ambulatory Visit: Payer: Self-pay

## 2020-06-25 DIAGNOSIS — I5022 Chronic systolic (congestive) heart failure: Secondary | ICD-10-CM | POA: Diagnosis not present

## 2020-06-25 LAB — BASIC METABOLIC PANEL
Anion gap: 9 (ref 5–15)
BUN: 14 mg/dL (ref 8–23)
CO2: 24 mmol/L (ref 22–32)
Calcium: 8.6 mg/dL — ABNORMAL LOW (ref 8.9–10.3)
Chloride: 109 mmol/L (ref 98–111)
Creatinine, Ser: 1.07 mg/dL (ref 0.61–1.24)
GFR, Estimated: >60 mL/min (ref >60)
Glucose, Bld: 85 mg/dL (ref 70–99)
Potassium: 3.9 mmol/L (ref 3.5–5.1)
Sodium: 142 mmol/L (ref 135–145)

## 2020-06-26 DIAGNOSIS — G4733 Obstructive sleep apnea (adult) (pediatric): Secondary | ICD-10-CM | POA: Diagnosis not present

## 2020-06-28 DIAGNOSIS — G4733 Obstructive sleep apnea (adult) (pediatric): Secondary | ICD-10-CM | POA: Diagnosis not present

## 2020-07-20 ENCOUNTER — Encounter: Payer: Self-pay | Admitting: Adult Health

## 2020-07-20 ENCOUNTER — Ambulatory Visit: Payer: PPO | Admitting: Adult Health

## 2020-07-20 ENCOUNTER — Other Ambulatory Visit: Payer: Self-pay

## 2020-07-20 VITALS — BP 106/72 | HR 66 | Ht 72.0 in | Wt 299.0 lb

## 2020-07-20 DIAGNOSIS — I639 Cerebral infarction, unspecified: Secondary | ICD-10-CM

## 2020-07-20 NOTE — Progress Notes (Signed)
[facility] [address] [place]. [place] [address] [phone]       STROKE FOLLOW UP NOTE  [name]. Date of Birth:  02-27-1946 Medical Record Number:  [id number]   Reason for Referral: stroke follow up    CHIEF COMPLAINT:  Chief Complaint  Patient presents with  . Follow-up    RM 44 with wife (fran) Pt is well, has some dizziness per wife     HPI:  Today, July 20, 2020, [name] returns for 49-month stroke follow-up accompanied by his wife.  He has been doing well since prior visit without new stroke/TIA symptoms.  He does c/o dizziness especially after initiating Jardiance and has improved since discontinuing but will continue to have occasional dizziness in relation to lower blood pressure readings.  He continues to follow with cardiology routinely for blood pressure management as this has been difficult to control.  Blood pressure today 106/72.  Reports compliance with Eliquis, Repatha and Crestor tolerating without side effects.  Reports ongoing compliance with CPAP for OSA management.  No further concerns at this time.   History provided for reference purposes only Update 01/15/2020 JM: [name] returns for stroke follow-up accompanied by his wife He has been stable without residual stroke deficits and denies new or recurring stroke/TIA symptoms He does report intermittent occipital headaches that have been present since his stroke and typically subside with use of Tylenol.  Headaches are not debilitating.  Denies visual changes or photophobia, phonophobia or nausea/vomiting Remains on Eliquis for secondary stroke prevention history of A. fib without bleeding or bruising Initiating Repatha in addition to Crestor for HLD management with history of statin intolerance managed by cardiology.  Recent lipid panel 11/25/2019 showed LDL 60 Blood pressure today 112/78 Diagnosed with sleep apnea and reports ongoing use of CPAP No concerns at this  time  Update 09/11/2019 JM: [name] is being seen for hospital follow-up complaint by his wife.  He has recovered well from a stroke standpoint without residual deficits or new/reoccurring stroke/TIAs.  He continues on Eliquis without bleeding or bruising.  Continues on Crestor 5 mg 3 times weekly but recently referred to lipid clinic for potential initiation of PCSK9 inhibitor with history of statin intolerance.  Blood pressure today 102/64 and reports occasional episodes of lower blood pressure with dizziness and feels as though related to dehydration. Blood pressure at home typically 120-130/80s.  He also plans on undergoing split-night study for possible sleep apnea at the end of this month.  Continues to follow with cardiology and PCP regularly for chronic disease management.  No concerns at this time.  Stroke admission 08/06/2019: Joshua Moran. is a 75 y.o. male with history of atrial fibrillation not on [facility] d/t low CHADSVASC score who presented on 08/06/2019 with recurrent aphasia.  Evaluated by stroke team and Dr. [name] with stroke work-up revealing left frontal lobe infarct embolic pattern in setting of known AF not on AC.  Recommend initiating Eliquis for secondary stroke prevention with CHA2DS2-VASc score 3 (prior 1).  LDL 106 previously on Crestor with history of multiple statin intolerances and recommended consideration of PCSK9 at outpatient follow-up for optimal cholesterol control.  No prior history of HTN or DM.  No prior history of stroke.  Discharged home in stable condition without therapy needs.  Stroke:   L frontal lobe infarct embolic in setting of known AF not on [facility]  Code Stroke CT head No acute abnormality. ASPECTS 10.  CTA head & neck no LVO, mild atherosclerosis head and neck. 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 Sinus dz  2D Echo ejection fraction 45 to  50%.  Mild left ventricular hypertrophy.  No cardiac source of embolism.    LDL 106  HgbA1c 5.8  Eliquis for VTE prophylaxis  aspirin 81 mg daily prior to admission, now on Eliquis (apixaban) daily. Continue Eliquis at d/c  Therapy recommendations: No PT OT needed   disposition: Home        ROS:   14 system review of systems performed and negative with exception of dizziness  PMH:  Past Medical History:  Diagnosis Date  . Abnormal screening CT of chest    coronary calcium in left main and RCA  . Anemia   . Arthritis   . CHF (congestive heart failure) ([facility])   . Complication of anesthesia    Pt reports difficult urinating  . Coronary artery calcification seen on CAT scan    no ischemic on nuclear stress test 09/2019  . 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  Cardiac MRI 2021 with EF 39% and moderate RV dysfunction and non-compaction  . Dyspnea    with some activity  . GERD (gastroesophageal reflux disease)    occasional  . Glaucoma   . Hyperlipidemia    statin intolerant at doses > Crestor 5mg  3 times weekly  . OSA on CPAP   . PAF (paroxysmal atrial fibrillation) ([facility])    CHADS2VASC score 2 on apixaban    PSH:  Past Surgical History:  Procedure Laterality Date  . CARDIOVERSION N/A 04/01/2020   Procedure: CARDIOVERSION;  Surgeon: Skeet Latch, MD;  Location: [facility];  Service: Cardiovascular;  Laterality: N/A;  . EYE SURGERY     bilateral cataracts  . KNEE ARTHROSCOPY W/ DEBRIDEMENT     left   . RETINAL DETACHMENT SURGERY Bilateral   . RIGHT/LEFT HEART CATH AND CORONARY ANGIOGRAPHY N/A 05/06/2020   Procedure: RIGHT/LEFT HEART CATH AND CORONARY ANGIOGRAPHY;  Surgeon: Larey Dresser, MD;  Location: [facility];  Service: Cardiovascular;  Laterality: N/A;  . TOTAL KNEE ARTHROPLASTY Left 01/01/2018   Procedure: LEFT TOTAL KNEE ARTHROPLASTY;  Surgeon: Gaynelle Arabian, MD;  Location: [facility];  Service: Orthopedics;   Laterality: Left;  50 mins  . TOTAL KNEE ARTHROPLASTY Right 06/25/2018   Procedure: TOTAL KNEE ARTHROPLASTY;  Surgeon: Gaynelle Arabian, MD;  Location: [facility];  Service: Orthopedics;  Laterality: Right;  45min  . VASECTOMY      Social History:  Social History   Socioeconomic History  . Marital status: Married    Spouse name: Not on file  . Number of children: Not on file  . Years of education: Not on file  . Highest education level: Not on file  Occupational History  . Not on file  Tobacco Use  . Smoking status: Never Smoker  . Smokeless tobacco: Never Used  Vaping Use  . Vaping Use: Never used  Substance and Sexual Activity  . Alcohol use: Yes    Alcohol/week: 1.0 standard drink    Types: 1 Cans of beer per week    Comment: Rare  . Drug use: No  . Sexual activity: Not Currently  Other Topics Concern  . Not on file  Social History Narrative  . Not on file   Social Determinants of Health  Financial Resource Strain: Not on file  Food Insecurity: Not on file  Transportation Needs: Not on file  Physical Activity: Not on file  Stress: Not on file  Social Connections: Not on file  Intimate Partner Violence: Not on file    Family History:  Family History  Problem Relation Age of Onset  . Lung cancer Mother        smoker    Medications:   Current Outpatient Medications on File Prior to Visit  Medication Sig Dispense Refill  . amiodarone (PACERONE) 200 MG tablet Take 200 mg by mouth daily.    Marland Kitchen apixaban (ELIQUIS) 5 MG TABS tablet Take 1 tablet (5 mg total) by mouth 2 (two) times daily. 60 tablet 0  . brimonidine (ALPHAGAN) 0.15 % ophthalmic solution Place 1 drop into the right eye 2 (two) times daily.    . cetirizine (ZYRTEC) 10 MG tablet Take 10 mg by mouth every evening.    . Cholecalciferol (VITAMIN D3) 50 MCG (2000 UT) TABS Take 2,000 Units by mouth at bedtime.     . Evolocumab (REPATHA SURECLICK) 163 MG/ML SOAJ Inject 1 pen into the skin every 14 (fourteen) days.  (Patient taking differently: Inject 140 mg into the skin every 14 (fourteen) days.) 2 pen 11  . ferrous sulfate 325 (65 FE) MG tablet Take 325 mg by mouth daily with breakfast.    . finasteride (PROSCAR) 5 MG tablet Take 5 mg by mouth daily.    . fluticasone (FLONASE) 50 MCG/ACT nasal spray Place 2 sprays into both nostrils 2 (two) times daily.     . Glucosamine 750 MG TABS Take 750 mg by mouth in the morning and at bedtime.     Marland Kitchen losartan (COZAAR) 25 MG tablet Take 0.5 tablets (12.5 mg total) by mouth daily. (Patient taking differently: Take 12.5 mg by mouth daily. PT taking 1 tablet daily) 15 tablet 3  . Melatonin 10 MG TABS Take 10 mg by mouth at bedtime.     . Multiple Vitamins-Minerals (CENTRUM MEN PO) Take 1 tablet by mouth daily.    . rosuvastatin (CRESTOR) 5 MG tablet Take 5 mg by mouth every Monday, Wednesday, and Friday.    . sildenafil (REVATIO) 20 MG tablet Take 40-100 mg by mouth daily as needed (erectile dysfunction.).   5  . spironolactone (ALDACTONE) 25 MG tablet Take 1 tablet (25 mg total) by mouth at bedtime. 90 tablet 3  . tamsulosin (FLOMAX) 0.4 MG CAPS capsule Take 0.4 mg by mouth at bedtime.      No current facility-administered medications on file prior to visit.    Allergies:   Allergies  Allergen Reactions  . Xarelto [Rivaroxaban] Rash    Patient denies allergy as of 08/07/19  . Penicillins Other (See Comments)    Very flushed  Has patient had a PCN reaction causing immediate rash, facial/tongue/throat swelling, SOB or lightheadedness with hypotension: No Has patient had a PCN reaction causing severe rash involving mucus membranes or skin necrosis: No Has patient had a PCN reaction that required hospitalization No Has patient had a PCN reaction occurring within the last 10 years: No If all of the above answers are "NO", then may proceed with Cephalosporin use.   . Statins Other (See Comments)    Pt c/o muscle aches with use of statins.  . Vancomycin Rash      Physical Exam  Vitals:   07/20/20 1334  BP: 106/72  Pulse: 66  Weight: 299 lb (135.6 kg)  Height:  6' (1.829 m)   Body mass index is 40.55 kg/m. No exam data present  General: well developed, well nourished, pleasant elderly Caucasian male, seated, in no evident distress Head: head normocephalic and atraumatic.   Neck: supple with no carotid or supraclavicular bruits Cardiovascular: regular rate and rhythm, no murmurs Musculoskeletal: no deformity Skin:  no rash/petichiae Vascular:  Normal pulses all extremities   Neurologic Exam Mental Status: Awake and fully alert. Normal speech and language. Oriented to place and time. Recent and remote memory intact. Attention span, concentration and fund of knowledge appropriate. Mood and affect appropriate.  Cranial Nerves: Pupils equal, briskly reactive to light. Extraocular movements full without nystagmus. Visual fields full to confrontation. Hearing intact. Facial sensation intact. Face, tongue, palate moves normally and symmetrically.  Motor: Normal bulk and tone. Normal strength in all tested extremity muscles. Sensory.: intact to touch , pinprick , position and vibratory sensation.  Coordination: Rapid alternating movements normal in all extremities. Finger-to-nose and heel-to-shin performed accurately bilaterally. Gait and Station: Arises from chair without difficulty. Stance is normal. Gait demonstrates normal stride length and balance without use of assistive device Reflexes: 1+ and symmetric. Toes downgoing.      ASSESSMENT: Joshua Moran. is a 75 y.o. year old male presented with aphasia on 08/06/2019 with stroke work-up revealing left frontal lobe infarct embolic pattern secondary to known AF not on [facility] therefore Eliquis initiated. Vascular risk factors include HLD, OSA on CPAP and A. fib    PLAN:  1. Left frontal lobe stroke:  a. Recovered well without residual deficits b. Continue Eliquis (apixaban) daily  and  Crestor and Repatha.   c. Ensure close PCP and cardiology follow-up for aggressive stroke risk factor management 2. Dizziness:  a. Unknown etiology but likely multifactorial b. Reports occasional dizziness associated with low blood pressures -advised to continue to monitor at home and he plans on following up with cardiology for further discussion c. Unable to appreciate any focal deficits on today's exam 3. HLD:  a. LDL goal <70.  Recent LDL 60.   b. History of statin intolerance.   c. Continue Repatha and Crestor managed by cardiology  4. Atrial fibrillation:  a. S/p DCCV to NSR in 03/2020- NSR today  b. On Eliquis per cardiology with CHA2DS2-VASc score of at least 8 5. OSA on CPAP:  a. Reports ongoing compliance monitored/managed by cardiology    Overall stable from stroke standpoint routinely followed by cardiology and PCP for secondary stroke prevention management therefore recommend follow-up on an as-needed basis with pt and wife agree with plan  CC:  [facility] provider: Dr. Wyatt Portela, Dwyane Luo, MD    I spent 30 minutes of face-to-face and non-face-to-face time with patient and wife.  This included previsit chart review, lab review, study review, order entry, electronic health record documentation, patient education regarding hx of stroke, atrial fibrillation on AC, dizziness complaints and possible etiologies, importance of managing stroke risk factors and answered all other questions to patient and wife satisfaction  Frann Rider, AGNP-BC  [facility] [address] [place], [place] [address]  Phone [phone] Fax [phone] Note: This document was prepared with digital dictation and possible smart phrase technology. Any transcriptional errors that result from this process are unintentional.

## 2020-07-20 NOTE — Patient Instructions (Signed)
Continue Eliquis (apixaban) daily  and Repatha and Crestor  for secondary stroke prevention  Continue to follow with cardiology for atrial fibrillation and Eliquis management as well as ongoing use of CPAP for sleep apnea management  Continue to follow up with PCP/cardiology regarding cholesterol, blood pressure (including low blood pressure) and diabetes management  Maintain strict control of hypertension with blood pressure goal below 130/90, diabetes with hemoglobin A1c goal below 6.5% and cholesterol with LDL cholesterol (bad cholesterol) goal below 70 mg/dL.       Thank you for coming to see [place] at [facility]. I hope we have been able to provide you high quality care today.  You may receive a patient satisfaction survey over the next few weeks. We would appreciate your feedback and comments so that we may continue to improve ourselves and the health of our patients.

## 2020-07-21 NOTE — Progress Notes (Signed)
I agree with the above plan 

## 2020-07-22 DIAGNOSIS — R351 Nocturia: Secondary | ICD-10-CM | POA: Diagnosis not present

## 2020-07-22 DIAGNOSIS — N281 Cyst of kidney, acquired: Secondary | ICD-10-CM | POA: Diagnosis not present

## 2020-07-22 DIAGNOSIS — N5201 Erectile dysfunction due to arterial insufficiency: Secondary | ICD-10-CM | POA: Diagnosis not present

## 2020-07-22 DIAGNOSIS — N200 Calculus of kidney: Secondary | ICD-10-CM | POA: Diagnosis not present

## 2020-07-22 DIAGNOSIS — R3915 Urgency of urination: Secondary | ICD-10-CM | POA: Diagnosis not present

## 2020-07-22 DIAGNOSIS — N401 Enlarged prostate with lower urinary tract symptoms: Secondary | ICD-10-CM | POA: Diagnosis not present

## 2020-07-23 DIAGNOSIS — H401114 Primary open-angle glaucoma, right eye, indeterminate stage: Secondary | ICD-10-CM | POA: Diagnosis not present

## 2020-07-23 DIAGNOSIS — H33033 Retinal detachment with giant retinal tear, bilateral: Secondary | ICD-10-CM | POA: Diagnosis not present

## 2020-07-24 ENCOUNTER — Encounter (HOSPITAL_COMMUNITY): Payer: Self-pay | Admitting: Cardiology

## 2020-07-24 ENCOUNTER — Ambulatory Visit (HOSPITAL_COMMUNITY)
Admission: RE | Admit: 2020-07-24 | Discharge: 2020-07-24 | Disposition: A | Discharge status: Home / Self Care | Payer: PPO | Source: Ambulatory Visit | Attending: Cardiology | Admitting: Cardiology

## 2020-07-24 ENCOUNTER — Other Ambulatory Visit: Payer: Self-pay

## 2020-07-24 VITALS — BP 140/88 | HR 60 | Wt 295.4 lb

## 2020-07-24 DIAGNOSIS — I951 Orthostatic hypotension: Secondary | ICD-10-CM | POA: Diagnosis not present

## 2020-07-24 DIAGNOSIS — G4733 Obstructive sleep apnea (adult) (pediatric): Secondary | ICD-10-CM | POA: Insufficient documentation

## 2020-07-24 DIAGNOSIS — E785 Hyperlipidemia, unspecified: Secondary | ICD-10-CM | POA: Diagnosis not present

## 2020-07-24 DIAGNOSIS — Z79899 Other long term (current) drug therapy: Secondary | ICD-10-CM | POA: Diagnosis not present

## 2020-07-24 DIAGNOSIS — I2510 Atherosclerotic heart disease of native coronary artery without angina pectoris: Secondary | ICD-10-CM | POA: Insufficient documentation

## 2020-07-24 DIAGNOSIS — Z7901 Long term (current) use of anticoagulants: Secondary | ICD-10-CM | POA: Insufficient documentation

## 2020-07-24 DIAGNOSIS — I4819 Other persistent atrial fibrillation: Secondary | ICD-10-CM | POA: Diagnosis not present

## 2020-07-24 DIAGNOSIS — I480 Paroxysmal atrial fibrillation: Secondary | ICD-10-CM | POA: Diagnosis not present

## 2020-07-24 DIAGNOSIS — I5022 Chronic systolic (congestive) heart failure: Secondary | ICD-10-CM | POA: Insufficient documentation

## 2020-07-24 DIAGNOSIS — I420 Dilated cardiomyopathy: Secondary | ICD-10-CM | POA: Diagnosis not present

## 2020-07-24 LAB — COMPREHENSIVE METABOLIC PANEL
ALT: 20 U/L (ref 0–44)
AST: 22 U/L (ref 15–41)
Albumin: 3.4 g/dL — ABNORMAL LOW (ref 3.5–5.0)
Alkaline Phosphatase: 66 U/L (ref 38–126)
Anion gap: 8 (ref 5–15)
BUN: 12 mg/dL (ref 8–23)
CO2: 24 mmol/L (ref 22–32)
Calcium: 8.9 mg/dL (ref 8.9–10.3)
Chloride: 103 mmol/L (ref 98–111)
Creatinine, Ser: 1.02 mg/dL (ref 0.61–1.24)
GFR, Estimated: >60 mL/min (ref >60)
Glucose, Bld: 99 mg/dL (ref 70–99)
Potassium: 4.5 mmol/L (ref 3.5–5.1)
Sodium: 135 mmol/L (ref 135–145)
Total Bilirubin: 1 mg/dL (ref 0.3–1.2)
Total Protein: 6.4 g/dL — ABNORMAL LOW (ref 6.5–8.1)

## 2020-07-24 LAB — CBC
HCT: 41.5 % (ref 39.0–52.0)
Hemoglobin: 13.4 g/dL (ref 13.0–17.0)
MCH: 30.5 pg (ref 26.0–34.0)
MCHC: 32.3 g/dL (ref 30.0–36.0)
MCV: 94.5 fL (ref 80.0–100.0)
Platelets: 207 10*3/uL (ref 150–400)
RBC: 4.39 MIL/uL (ref 4.22–5.81)
RDW: 13.5 % (ref 11.5–15.5)
WBC: 5.9 10*3/uL (ref 4.0–10.5)
nRBC: 0 % (ref 0.0–0.2)

## 2020-07-24 LAB — TSH: TSH: 4.038 u[IU]/mL (ref 0.350–4.500)

## 2020-07-24 MED ORDER — CARVEDILOL 3.125 MG PO TABS: 3.1250 mg | ORAL_TABLET | Freq: Two times a day (BID) | ORAL | 11 refills | Status: DC

## 2020-07-24 NOTE — Patient Instructions (Signed)
Labs done today. We will contact you only if your labs are abnormal.  START taking Carvedilol 3.125mg  (1 tablet) by mouth 2 times daily.  No other medication changes were made. Please continue all current medications as prescribed.  Your physician recommends that you schedule a follow-up appointment in: 3 weeks with our Clinic Pharmacist, Lauren and in 3 months with Dr. Aundra Dubin   If you have any questions or concerns before your next appointment please send [place] a message through [facility] or call our office at [phone].    TO LEAVE A MESSAGE FOR THE NURSE SELECT OPTION 2, PLEASE LEAVE A MESSAGE INCLUDING: . YOUR NAME . DATE OF BIRTH . CALL BACK NUMBER . REASON FOR CALL**this is important as we prioritize the call backs  YOU WILL RECEIVE A CALL BACK THE SAME DAY AS LONG AS YOU CALL BEFORE 4:00 PM   Do the following things EVERYDAY: 1) Weigh yourself in the morning before breakfast. Write it down and keep it in a log. 2) Take your medicines as prescribed 3) Eat low salt foods--Limit salt (sodium) to 2000 mg per day.  4) Stay as active as you can everyday 5) Limit all fluids for the day to less than 2 liters   At the [facility], you and your health needs are our priority. As part of our continuing mission to provide you with exceptional heart care, we have created designated Provider Care Teams. These Care Teams include your primary Cardiologist (physician) and Advanced Practice Providers (APPs- Physician Assistants and Nurse Practitioners) who all work together to provide you with the care you need, when you need it.   You may see any of the following providers on your designated Care Team at your next follow up: Marland Kitchen Dr Glori Bickers . Dr Loralie Champagne . Darrick Grinder, NP . Lyda Jester, PA . Audry Riles, PharmD   Please be sure to bring in all your medications bottles to every appointment.

## 2020-07-25 NOTE — Progress Notes (Signed)
PCP: Lawerance Cruel, MD Cardiology: Dr. Radford Pax HF Cardiology: Dr. Aundra Dubin  75 y.o. with history of chronic systolic CHF and paroxysmal atrial fibrillation was referred by Dr. Radford Pax for evaluation of CHF.  Patient has been short of breath for years, worse over the last 6 months.  He has had an extensive workup.  Echo in 3/21 showed EF 45-50% with mildly decreased RV function.  Cardiolite in 4/21 showed fixed apical defect.  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  PYP scan in 8/21 was not suggestive of TTR cardiac amyloidosis.   He had been in persistent atrial fibrillation for about 2 years.  He saw Dr. Quentin Ore and was started on amiodarone.  DCCV was done in 10/21 back to NSR. He is in NSR today.  He was found to have M-spike on SPEP.  He saw Dr. Marin Olp who thought this was MGUS.   LHC/RHC in 12/21 showed occluded small OM2 and 80-90% stenosis mid PLOM (small caliber), no interventional target; normal filling pressures and preserved cardiac output.   He was unable to tolerate Jardiance due to extreme dizziness.   Patient returns for followup of CHF.  Using CPAP.  Weight up about 3 lbs.  He walks on a treadmill for exercise, mild exertional dyspnea on the treadmill.  Dyspnea with stairs but can climb up them without stopping.  No orthopnea/PND.  No chest pain.  Wearing compression stockings.  No lightheadedness.    Labs (7/21): Urine immunofixation with monoclonal IgG protein, SPEP with M-spike.   Labs (10/21): TSH normal, K 4, creatinine 0.97, LFTs normal Labs (11/21): K 4.8, creatinine 1.07, hgb 14.7, LFTs normal, TSH normal Labs (12/21): LDL 66, HDL 45 Labs (1/22): K 3.9, creatinine 1.07  ECG (personally reviewed): NSR, normal  PMH: 1. CVA: Occurred in 3/21, he was not on apixaban at the time.  2. GERD 3. OSA: Uses CPAP.  4. Hyperlipidemia 5. Atrial fibrillation: Paroxysmal.  - DCCV to NSR in  10/21.  6. MGUS 7. Chronic systolic CHF: Echo (3/24) with EF 45-50%, mild LVH, mildly decreased RV systolic function. Primarily nonischemic cardiomyopathy.  - Cardiolite (4/21): Fixed apical defect.  - Cardiac MRI (9/21): Mild LV dilation with mild LV hypertrophy, EF 39%, prominent trabeculations concerning for noncompaction. Mild RV dilation with EF 28%, severe LAE.  No delayed enhancement.  - PYP scan (8/21): grade 1, H/CL 1-1.5.  Unlikely to have cardiac amyloidosis.  - LHC/RHC (12/21): small OM2 totally occluded, 80-90% mid small PLOM (no interventional target); mean RA 5, PA 25/9, mean PCWP 5, CI 2.77 Fick/3.29 Thermo.  8. Orthostatic hypotension.  9. CAD: Coronary angiography 12/21 with small OM2 totally occluded, 80-90% mid small PLOM (no interventional target)  Social History   Socioeconomic History  . Marital status: Married    Spouse name: Not on file  . Number of children: Not on file  . Years of education: Not on file  . Highest education level: Not on file  Occupational History  . Not on file  Tobacco Use  . Smoking status: Never Smoker  . Smokeless tobacco: Never Used  Vaping Use  . Vaping Use: Never used  Substance and Sexual Activity  . Alcohol use: Yes    Alcohol/week: 1.0 standard drink    Types: 1 Cans of beer per week    Comment: Rare  . Drug use: No  . Sexual activity: Not Currently  Other Topics Concern  .  Not on file  Social History Narrative  . Not on file   Social Determinants of Health   Financial Resource Strain: Not on file  Food Insecurity: Not on file  Transportation Needs: Not on file  Physical Activity: Not on file  Stress: Not on file  Social Connections: Not on file  Intimate Partner Violence: Not on file   Family History  Problem Relation Age of Onset  . Lung cancer Mother        smoker   ROS: All systems reviewed and negative except as per HPI.   Current Outpatient Medications  Medication Sig Dispense Refill  . amiodarone  (PACERONE) 200 MG tablet Take 200 mg by mouth daily.    Marland Kitchen apixaban (ELIQUIS) 5 MG TABS tablet Take 1 tablet (5 mg total) by mouth 2 (two) times daily. 60 tablet 0  . brimonidine (ALPHAGAN) 0.15 % ophthalmic solution Place 1 drop into the right eye 2 (two) times daily.    . carvedilol (COREG) 3.125 MG tablet Take 1 tablet (3.125 mg total) by mouth 2 (two) times daily. 60 tablet 11  . cetirizine (ZYRTEC) 10 MG tablet Take 10 mg by mouth every evening.    . Cholecalciferol (VITAMIN D3) 50 MCG (2000 UT) TABS Take 2,000 Units by mouth at bedtime.     . Evolocumab (REPATHA SURECLICK) 419 MG/ML SOAJ Inject 1 pen into the skin every 14 (fourteen) days. 2 pen 11  . ferrous sulfate 325 (65 FE) MG tablet Take 325 mg by mouth daily with breakfast.    . finasteride (PROSCAR) 5 MG tablet Take 5 mg by mouth daily.    . fluticasone (FLONASE) 50 MCG/ACT nasal spray Place 2 sprays into both nostrils 2 (two) times daily.     . Glucosamine 750 MG TABS Take 750 mg by mouth in the morning and at bedtime.     Marland Kitchen losartan (COZAAR) 25 MG tablet Take 25 mg by mouth daily.    . Melatonin 10 MG TABS Take 10 mg by mouth at bedtime.     . Multiple Vitamins-Minerals (CENTRUM MEN PO) Take 1 tablet by mouth daily.    . rosuvastatin (CRESTOR) 5 MG tablet Take 5 mg by mouth every Monday, Wednesday, and Friday.    . sildenafil (REVATIO) 20 MG tablet Take 40-100 mg by mouth daily as needed (erectile dysfunction.).   5  . spironolactone (ALDACTONE) 25 MG tablet Take 1 tablet (25 mg total) by mouth at bedtime. 90 tablet 3  . tamsulosin (FLOMAX) 0.4 MG CAPS capsule Take 0.4 mg by mouth at bedtime.      No current facility-administered medications for this encounter.   BP 140/88   Pulse 60   Wt 134 kg (295 lb 6.4 oz)   SpO2 96%   BMI 40.06 kg/m  General: NAD Neck: No JVD, no thyromegaly or thyroid nodule.  Lungs: Clear to auscultation bilaterally with normal respiratory effort. CV: Nondisplaced PMI.  Heart regular S1/S2, no  S3/S4, no murmur.  No peripheral edema.  No carotid bruit.  Normal pedal pulses.  Abdomen: Soft, nontender, no hepatosplenomegaly, no distention.  Skin: Intact without lesions or rashes.  Neurologic: Alert and oriented x 3.  Psych: Normal affect. Extremities: No clubbing or cyanosis.  HEENT: Normal.   Assessment/Plan: 1. Chronic systolic CHF: Cardiac MRI in 9/21 with EF 39% and evidence for noncompaction cardiomyopathy, no delayed enhancement, significant RV dysfunction with RV EF 22%.  PYP scan was not suggestive of TTR amyloidosis.  12/21 LHC showed  CAD but does not explain cardiomyopathy, no interventional target.  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  On exam, he is not volume overloaded.  NYHA class II symptoms, feels better in NSR.  He has a history of orthostatic hypotension which has limited medication titration but this is better in NSR as well.  Will continue to try to gently titrate up HF meds.  - He did not tolerate Jardiance due to dizziness.  - Continue spironolactone 25 mg daily.  - Continue losartan to 25 mg daily. May be able to transition to [facility] soon, BP looks better.  - Add Coreg 3.125 mg bid.  2. Atrial fibrillation: Paroxysmal.  He remains in NSR after DCCV in 10/21.  He is at significant risk for recurrent atrial fibrillation given severe LAE. Not good candidate for atrial fibrillation ablation per EP.  - Continue amiodarone 200 mg daily, check LFTs and TSH normal.  He will need regular eye exam.  - Continue apixaban 5 mg bid.  3. OSA: Continue CPAP.  4. Orthostatic hypotension: Not symptomatic in NSR.   - Continue compression stockings and follow over time.  This has limited cardiac med initiation.  5. CAD: Moderate disease on cath in 12/21, no intervention target.  No chest pain.  Extent of disease does not explain cardiomyopathy.   - Continue Crestor and Repatha, good lipids in 12/21.  - He is on apixaban with stable CAD, so no ASA.    Followup with HF pharmacist in 3 wks (hopefully transition to [facility]) and me in 3 months.   Loralie Champagne 07/25/2020

## 2020-07-29 ENCOUNTER — Other Ambulatory Visit: Payer: Self-pay

## 2020-07-29 ENCOUNTER — Ambulatory Visit (INDEPENDENT_AMBULATORY_CARE_PROVIDER_SITE_OTHER): Payer: PPO | Admitting: Otolaryngology

## 2020-07-29 ENCOUNTER — Encounter (INDEPENDENT_AMBULATORY_CARE_PROVIDER_SITE_OTHER): Payer: Self-pay | Admitting: Otolaryngology

## 2020-07-29 VITALS — Temp 94.8°F

## 2020-07-29 DIAGNOSIS — J343 Hypertrophy of nasal turbinates: Secondary | ICD-10-CM | POA: Diagnosis not present

## 2020-07-29 DIAGNOSIS — J310 Chronic rhinitis: Secondary | ICD-10-CM | POA: Diagnosis not present

## 2020-07-29 DIAGNOSIS — G4733 Obstructive sleep apnea (adult) (pediatric): Secondary | ICD-10-CM | POA: Diagnosis not present

## 2020-07-29 NOTE — Progress Notes (Signed)
HPI: Joshua Minix. is a 75 y.o. male who presents for evaluation of nasal obstruction that comes and goes.  He presently uses Flonase 2 sprays twice daily on a regular basis.  Just recently 6 months ago was started on nasal CPAP as he has obstructive sleep apnea.  Sometimes is nose blocks up to the point where he cannot breathe through the nose.  He has mild nasal congestion the day.  After decongesting the nose he felt like his breathing was normal. His weight has been relatively stable. He apparently has had "sinus issues" for years has previously been told that he had nasal polyps. He has also had some recent bouts of dizziness that last for several hours and wondered if he had "crystals".  Past Medical History:  Diagnosis Date  . Abnormal screening CT of chest    coronary calcium in left main and RCA  . Anemia   . Arthritis   . CHF (congestive heart failure) ([place])   . Complication of anesthesia    Pt reports difficult urinating  . Coronary artery calcification seen on CAT scan    no ischemic on nuclear stress test 09/2019  . DCM (dilated cardiomyopathy) ([place])    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  Cardiac MRI 2021 with EF 39% and moderate RV dysfunction and non-compaction  . Dyspnea    with some activity  . GERD (gastroesophageal reflux disease)    occasional  . Glaucoma   . Hyperlipidemia    statin intolerant at doses > Crestor 5mg  3 times weekly  . OSA on CPAP   . PAF (paroxysmal atrial fibrillation) ([facility])    CHADS2VASC score 2 on apixaban   Past Surgical History:  Procedure Laterality Date  . CARDIOVERSION N/A 04/01/2020   Procedure: CARDIOVERSION;  Surgeon: Skeet Latch, MD;  Location: [place];  Service: Cardiovascular;  Laterality: N/A;  . EYE SURGERY     bilateral cataracts  . KNEE ARTHROSCOPY W/ DEBRIDEMENT     left   . RETINAL DETACHMENT SURGERY Bilateral   . RIGHT/LEFT HEART CATH AND CORONARY ANGIOGRAPHY N/A 05/06/2020   Procedure: RIGHT/LEFT HEART CATH  AND CORONARY ANGIOGRAPHY;  Surgeon: Larey Dresser, MD;  Location: [facility];  Service: Cardiovascular;  Laterality: N/A;  . TOTAL KNEE ARTHROPLASTY Left 01/01/2018   Procedure: LEFT TOTAL KNEE ARTHROPLASTY;  Surgeon: Gaynelle Arabian, MD;  Location: [facility];  Service: Orthopedics;  Laterality: Left;  50 mins  . TOTAL KNEE ARTHROPLASTY Right 06/25/2018   Procedure: TOTAL KNEE ARTHROPLASTY;  Surgeon: Gaynelle Arabian, MD;  Location: [facility];  Service: Orthopedics;  Laterality: Right;  12min  . VASECTOMY     Social History   Socioeconomic History  . Marital status: Married    Spouse name: Not on file  . Number of children: Not on file  . Years of education: Not on file  . Highest education level: Not on file  Occupational History  . Not on file  Tobacco Use  . Smoking status: Never Smoker  . Smokeless tobacco: Never Used  Vaping Use  . Vaping Use: Never used  Substance and Sexual Activity  . Alcohol use: Yes    Alcohol/week: 1.0 standard drink    Types: 1 Cans of beer per week    Comment: Rare  . Drug use: No  . Sexual activity: Not Currently  Other Topics Concern  . Not on file  Social History Narrative  . Not on file   Social Determinants of Health  Financial Resource Strain: Not on file  Food Insecurity: Not on file  Transportation Needs: Not on file  Physical Activity: Not on file  Stress: Not on file  Social Connections: Not on file   Family History  Problem Relation Age of Onset  . Lung cancer Mother        smoker   Allergies  Allergen Reactions  . Xarelto [Rivaroxaban] Rash    Patient denies allergy as of 08/07/19  . Penicillins Other (See Comments)    Very flushed  Has patient had a PCN reaction causing immediate rash, facial/tongue/throat swelling, SOB or lightheadedness with hypotension: No Has patient had a PCN reaction causing severe rash involving mucus membranes or skin necrosis: No Has patient had a PCN reaction that required hospitalization  No Has patient had a PCN reaction occurring within the last 10 years: No If all of the above answers are "NO", then may proceed with Cephalosporin use.   . Statins Other (See Comments)    Pt c/o muscle aches with use of statins.  . Vancomycin Rash   Prior to Admission medications   Medication Sig Start Date End Date Taking? Authorizing Provider  amiodarone (PACERONE) 200 MG tablet Take 200 mg by mouth daily.    [provider]  apixaban (ELIQUIS) 5 MG TABS tablet Take 1 tablet (5 mg total) by mouth 2 (two) times daily. 08/08/19   Amin, Jeanella Flattery, MD  brimonidine (ALPHAGAN) 0.15 % ophthalmic solution Place 1 drop into the right eye 2 (two) times daily.    [provider]  carvedilol (COREG) 3.125 MG tablet Take 1 tablet (3.125 mg total) by mouth 2 (two) times daily. 07/24/20 07/24/21  Larey Dresser, MD  cetirizine (ZYRTEC) 10 MG tablet Take 10 mg by mouth every evening.    [provider]  Cholecalciferol (VITAMIN D3) 50 MCG (2000 UT) TABS Take 2,000 Units by mouth at bedtime.     [provider]  Evolocumab (REPATHA SURECLICK) 956 MG/ML SOAJ Inject 1 pen into the skin every 14 (fourteen) days. 09/19/19   Sueanne Margarita, MD  ferrous sulfate 325 (65 FE) MG tablet Take 325 mg by mouth daily with breakfast.    [provider]  finasteride (PROSCAR) 5 MG tablet Take 5 mg by mouth daily.    [provider]  fluticasone (FLONASE) 50 MCG/ACT nasal spray Place 2 sprays into both nostrils 2 (two) times daily.     [provider]  Glucosamine 750 MG TABS Take 750 mg by mouth in the morning and at bedtime.     [provider]  losartan (COZAAR) 25 MG tablet Take 25 mg by mouth daily.    [provider]  Melatonin 10 MG TABS Take 10 mg by mouth at bedtime.     [provider]  Multiple Vitamins-Minerals (CENTRUM MEN PO) Take 1 tablet by mouth daily.    [provider]  rosuvastatin (CRESTOR) 5 MG tablet  Take 5 mg by mouth every Monday, Wednesday, and Friday.    [provider]  sildenafil (REVATIO) 20 MG tablet Take 40-100 mg by mouth daily as needed (erectile dysfunction.).  09/28/17   [provider]  spironolactone (ALDACTONE) 25 MG tablet Take 1 tablet (25 mg total) by mouth at bedtime. 06/10/20   Larey Dresser, MD  tamsulosin (FLOMAX) 0.4 MG CAPS capsule Take 0.4 mg by mouth at bedtime.  12/05/19   [provider]     Positive ROS: Otherwise negative  All other systems have been reviewed and were otherwise negative with the exception of those mentioned in the HPI and as above.  Physical Exam: Constitutional: Alert, well-appearing, no acute distress.  Patient is moderately obese Ears: External ears without lesions or tenderness. Ear canals are clear bilaterally with intact, clear TMs.  No middle ear effusion noted. Nasal: External nose without lesions. Septum slightly deviated to the left.  Has large swollen inferior turbinates bilaterally..  On nasal endoscopy the right middle meatus was widely patent with no evidence of mucopurulent discharge.  Perhaps a small polyp up within the middle meatus but this does not extend into the nasal cavity.  The nasopharynx was clear.  The nasal left nasal cavity was little bit narrowed because of septal deviation to the left but the middle meatus region was clear again but no drainage and no polyps.  The nasopharynx was clear. Oral: Lips and gums without lesions. Tongue and palate mucosa without lesions. Posterior oropharynx clear. Neck: No palpable adenopathy or masses Respiratory: Breathing comfortably  Skin: No facial/neck lesions or rash noted.  Procedures  Assessment: Nasal obstruction secondary to chronic rhinitis and turbinate hypertrophy.  No clinical evidence of nasal polyps. His description of his dizziness is not consistent with BPPV or inner ear "crystals"  Plan: Would recommend regular use of nasal steroid  spray Flonase 2 sprays each nostril at night.  If nasal obstruction is severe could occasionally use Afrin but no more than 2 or 3 times a week as this can be addictive. Also recommended weight loss as this will help with obstructive sleep apnea. Briefly discussed possibility of turbinate reductions which would help improve his nasal breathing however this would require surgery and he has history of cardiac problems and is presently on Eliquis.  This would have to be cleared by his cardiologist and he would have to stop Eliquis for 4 to 5 days in order to have the surgery to reduce the turbinates.  Radene Journey, MD

## 2020-08-03 ENCOUNTER — Other Ambulatory Visit: Payer: Self-pay | Admitting: Cardiology

## 2020-08-04 ENCOUNTER — Encounter (HOSPITAL_COMMUNITY): Payer: Self-pay

## 2020-08-07 ENCOUNTER — Other Ambulatory Visit (HOSPITAL_COMMUNITY): Payer: Self-pay | Admitting: *Deleted

## 2020-08-15 ENCOUNTER — Other Ambulatory Visit (HOSPITAL_COMMUNITY): Payer: Self-pay | Admitting: Cardiology

## 2020-08-16 NOTE — Progress Notes (Incomplete)
***In Progress*** OIZ:TIWP, Dwyane Luo, MD Cardiology: Dr. Radford Pax HF Cardiology: Dr. Aundra Dubin  HPI:  75 y.o.with history of chronic systolic CHF and paroxysmal atrial fibrillation was referred by Dr. Radford Pax for evaluation of CHF. Patient had been short of breath for years, worse over the last 6 months. He has had an extensive workup. Echo in 3/21 showed EF 45-50% with mildly decreased RV function. Cardiolite in 4/21 showed fixed apical defect. 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 PYP scan in 8/21 was not suggestive of TTR cardiac amyloidosis. He had been in persistent atrial fibrillation for about 2 years. He saw Dr. Quentin Ore and was started on amiodarone. DCCV was done in 10/21 back to NSR.He was found to have M-spike on SPEP. He saw Dr. Marin Olp who thought this was MGUS.   LHC/RHC in 12/21 showed occluded small OM2 and 80-90% stenosis mid PLOM (small caliber), no interventional target; normal filling pressures and preserved cardiac output.   Patient returned to HF Clinic for follow up on 05/20/20.He was unable to tolerate Jardiance due to extreme dizziness.Was doing pretty well, dyspneic only with stairs or walking long distances.He was in NSR in clinic. Was using CPAP. Weight was down 2 lbs. Losartan 12.5 mg daily was started at this visit.  Patient returned to HF clinic on 06/10/20 for pharmacist medication titration. Was feeling well. Had occasional dizziness in the morning, which was worse upon standing. This was not new. Noted palpitations that occurred several times per week, but did not last long. Could walk 1/2 mile on the treadmill before getting SOB. Got SOB going up inclines or upstairs. Spironolactone was increased to 25 mg daily at this visit.  Recently returned to HF clinic for follow up with Dr. Aundra Dubin on 07/24/20. Was using CPAP. Weight was up 3 lbs. Mild exertional dyspnea on  the treadmill. Dyspnea with stairs but could climb them without stopping. No orthopnea/PND. No chest pain. Was wearing compression stockings. No lightheadedness.   Today he returns to HF clinic for pharmacist medication titration. At last visit with MD, carvedilol 3.125 mg BID was started. Patient reported low BP 89/65 and elevated HR 89 (usually 60s) with carvedilol, so he stopped carvedilol.  140/88, 60, 295 lbs - Has some lows recently 102-108 SBP A) Try Metoprolol 12.5 daily B or if BP up) switch losartan > Entresto 24/26 F/u: 3 weeks BP up and I can do multiple things, otherwise 5/24 DM   Overall feeling ***. Dizziness, lightheadedness, fatigue:  Chest pain or palpitations:  How is your breathing?: *** SOB: Able to complete all ADLs. Activity level ***  Weight at home pounds. Takes furosemide/torsemide/bumex *** mg *** daily.  LEE PND/Orthopnea  Appetite *** Low-salt diet:   Physical Exam Cost/affordability of meds    HF Medications: Losartan 25 mg daily Spironolactone 25 mg daily  Has the patient been experiencing any side effects to the medications prescribed?  {YES NO:22349}  Does the patient have any problems obtaining medications due to transportation or finances?   {YES NO:22349} - Has [employer] Medicare  Understanding of regimen: {excellent/good/fair/poor:19665} Understanding of indications: {excellent/good/fair/poor:19665} Potential of compliance: {excellent/good/fair/poor:19665} Patient understands to avoid NSAIDs. Patient understands to avoid decongestants.    Pertinent Lab Values: . Serum creatinine ***, BUN ***, Potassium ***, Sodium ***, BNP ***, Magnesium ***, Digoxin ***   Vital Signs: . Weight: *** (last clinic weight: ***) . Blood pressure: ***  . Heart rate: ***  Assessment/Plan: 1. Chronic systolic CHF: Cardiac MRI in 9/21 with EF 39% and evidence for noncompaction cardiomyopathy, no delayed enhancement, significant RV  dysfunction with RV EF 22%.  PYP scan was not suggestive of TTR amyloidosis.  12/21 LHC showed CAD but does not explain cardiomyopathy, no interventional target.  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  On exam, he is not volume overloaded.  NYHA class II symptoms, feels better in NSR.  He has a history of orthostatic hypotension which has limited medication titration but this is better in NSR as well.  Will continue to try to gently titrate up HF meds.  - Continue losartan to 25 mg daily. May be able to transition to [facility] soon, BP looks better.  - Continue spironolactone 25 mg daily.  - He did not tolerate Jardiance due to dizziness. - Follow-up ***  2. Atrial fibrillation: Paroxysmal.  He remains in NSR after DCCV in 10/21.  He is at significant risk for recurrent atrial fibrillation given severe LAE. Not good candidate for atrial fibrillation ablation per EP.  - Continue amiodarone 200 mg daily - Continue apixaban 5 mg BID  3. OSA: Continue CPAP.   4. Orthostatic hypotension: Not symptomatic.  - Continue compression stockings and follow over time.  This has limited cardiac med initiation.   5. CAD: Moderate disease on cath in 12/21, no intervention target.  No chest pain.  Extent of disease does not explain cardiomyopathy.   - Continue rosuvastatin and Repatha, good lipids in 12/21.  - He is on apixaban with stable CAD, so no ASA.   Richardine Service, PharmD, [place] PGY2 Cardiology Pharmacy Resident  Audry Riles, PharmD, BCPS, [facility], CPP Heart Failure Clinic Pharmacist [phone]

## 2020-08-17 ENCOUNTER — Other Ambulatory Visit: Payer: Self-pay

## 2020-08-17 ENCOUNTER — Ambulatory Visit (HOSPITAL_COMMUNITY)
Admission: RE | Admit: 2020-08-17 | Discharge: 2020-08-17 | Disposition: A | Discharge status: Home / Self Care | Payer: PPO | Source: Ambulatory Visit | Attending: Pharmacist | Admitting: Pharmacist

## 2020-08-17 VITALS — BP 108/72 | HR 77 | Wt 295.6 lb

## 2020-08-17 DIAGNOSIS — I480 Paroxysmal atrial fibrillation: Secondary | ICD-10-CM | POA: Insufficient documentation

## 2020-08-17 DIAGNOSIS — I951 Orthostatic hypotension: Secondary | ICD-10-CM | POA: Diagnosis not present

## 2020-08-17 DIAGNOSIS — Z7901 Long term (current) use of anticoagulants: Secondary | ICD-10-CM | POA: Diagnosis not present

## 2020-08-17 DIAGNOSIS — I2510 Atherosclerotic heart disease of native coronary artery without angina pectoris: Secondary | ICD-10-CM | POA: Diagnosis not present

## 2020-08-17 DIAGNOSIS — I5022 Chronic systolic (congestive) heart failure: Secondary | ICD-10-CM | POA: Diagnosis not present

## 2020-08-17 DIAGNOSIS — G4733 Obstructive sleep apnea (adult) (pediatric): Secondary | ICD-10-CM | POA: Insufficient documentation

## 2020-08-17 MED ORDER — METOPROLOL SUCCINATE ER 25 MG PO TB24: 12.5000 mg | ORAL_TABLET | Freq: Every day | ORAL | 11 refills | Status: DC

## 2020-08-17 NOTE — Patient Instructions (Addendum)
It was a pleasure seeing you today!  MEDICATIONS: -We are changing your medications today -Start taking metoprolol succinate 12.5 mg (1/2 tablet) daily  -Call if you have questions about your medications.  NEXT APPOINTMENT: Return to clinic in 2 months with Dr. Aundra Dubin.  In general, to take care of your heart failure: -Limit your fluid intake to 2 Liters (half-gallon) per day.   -Limit your salt intake to ideally 2-3 grams (2000-3000 mg) per day. -Weigh yourself daily and record, and bring that "weight diary" to your next appointment.  (Weight gain of 2-3 pounds in 1 day typically means fluid weight.) -The medications for your heart are to help your heart and help you live longer.   -Please contact us before stopping any of your heart medications.  Call the clinic at [phone] with questions or to reschedule future appointments.

## 2020-08-17 NOTE — Progress Notes (Signed)
GQQ:PYPP, Dwyane Luo, MD Cardiology: Dr. Radford Pax HF Cardiology: Dr. Aundra Dubin  HPI:  75 y.o.with history of chronic systolic CHF and paroxysmal atrial fibrillation was referred by Dr. Radford Pax for evaluation of CHF. Patient had been short of breath for years, worse over the last 6 months. He has had an extensive workup. Echo in 3/21 showed EF 45-50% with mildly decreased RV function. Cardiolite in 4/21 showed fixed apical defect. 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 PYP scan in 8/21 was not suggestive of TTR cardiac amyloidosis. He had been in persistent atrial fibrillation for about 2 years. He saw Dr. Quentin Ore and was started on amiodarone. DCCV was done in 10/21 back to NSR.He was found to have M-spike on SPEP. He saw Dr. Marin Olp who thought this was MGUS.   LHC/RHC in 12/21 showed occluded small OM2 and 80-90% stenosis mid PLOM (small caliber), no interventional target; normal filling pressures and preserved cardiac output.   Patient returned to HF Clinic for follow up on 05/20/20.He was unable to tolerate Jardiance due to extreme dizziness.Was doing pretty well, dyspneic only with stairs or walking long distances.He was in NSR in clinic. Was using CPAP. Weight was down 2 lbs. Losartan 12.5 mg daily was started at this visit.  Patient returned to HF clinic on 06/10/20 for pharmacist medication titration. Was feeling well. Had occasional dizziness in the morning, which was worse upon standing. This was not new. Noted palpitations that occurred several times per week, but did not last long. Could walk 1/2 mile on the treadmill before getting SOB. Got SOB going up inclines or upstairs. Spironolactone was increased to 25 mg daily at this visit.  Recently returned to HF clinic for follow up with Dr. Aundra Dubin on 07/24/20. Was using CPAP. Weight was up 3 lbs. Mild exertional dyspnea on the treadmill.  Dyspnea with stairs but could climb them without stopping. No orthopnea/PND. No chest pain. Was wearing compression stockings. No lightheadedness.   Today he returns to HF clinic for pharmacist medication titration. At last visit with MD, carvedilol 3.125 mg BID was started. Patient reported low BP 89/65 and elevated HR 89 (usually 60s) with carvedilol, so he stopped carvedilol on 08/07/20. He states that BP improved after stopping carvedilol with SBP 100-110s with one low BP reading this morning ~80/60s. He is concerned about his HR that remains above his baseline in 70-80s. He reports dizziness/lightheadedness when standing up, however he does not get dizzy when walking on the treadmill and on flat surfaces. He has not fallen due to dizziness. Has chronic fatigue that is stable. No chest pain or palpitations. Is able to walk 20-30 minutes before getting SOB. Weight stable at home ~288 lbs. No LEE, PND, or orthopnea. Appetite is great and adheres to low-salt diet.  HF Medications: Losartan 12.5 mg daily Spironolactone 25 mg daily  Has the patient been experiencing any side effects to the medications prescribed?  no  Does the patient have any problems obtaining medications due to transportation or finances?   no - Has [employer] Medicare  Understanding of regimen: good Understanding of indications: good Potential of compliance: good Patient understands to avoid NSAIDs. Patient understands to avoid decongestants.    Pertinent Lab Values: . 07/24/20: Serum creatinine 1.02, BUN 12, Potassium 4.5, Sodium 135   Vital Signs: . Weight: 295.6 lbs (last clinic weight: 295 lbs) . Blood pressure: 108/72  . Heart rate: 77  Assessment/Plan: 1. Chronic systolic CHF: Cardiac MRI in 9/21 with EF 39% and evidence for noncompaction cardiomyopathy, no delayed enhancement, significant RV dysfunction with RV EF 22%.  PYP scan was not suggestive of TTR amyloidosis.  12/21 LHC showed CAD but does not  explain cardiomyopathy, no interventional target.  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 He has a history of orthostatic hypotension which has limited medication titration but this is better in NSR as well.  Will continue to try to gently titrate up HF meds.  - Euvolemic on exam.  NYHA class II symptoms, feels better in NSR. - Start metoprolol succinate 12.5 mg daily. Previously did not tolerate carvedilol.  - Continue losartan 12.5 mg daily. Unable to switch to [facility] at this time given low BP and dizziness/lightheadedness. - Continue spironolactone 25 mg daily.  - He did not tolerate Jardiance due to dizziness. - Follow-up on 10/27/20 with Dr. Aundra Dubin  2. Atrial fibrillation: Paroxysmal.  He remains in NSR after DCCV in 10/21.  He is at significant risk for recurrent atrial fibrillation given severe LAE. Not good candidate for atrial fibrillation ablation per EP.  - Continue amiodarone 200 mg daily - Continue apixaban 5 mg BID  3. OSA: Continue CPAP.   4. Orthostatic hypotension: This has limited cardiac med initiation.  - Continue compression stockings and follow over time.  - Encouraged patient to drink more fluids since this helps with his symptoms and he is euvolemic on exam today.   5. CAD: Moderate disease on cath in 12/21, no intervention target.  No chest pain.  Extent of disease does not explain cardiomyopathy.   - Continue rosuvastatin and Repatha, good lipids in 05/2020.  - He is on apixaban with stable CAD, so no ASA.   Richardine Service, PharmD, [place] PGY2 Cardiology Pharmacy Resident  Audry Riles, PharmD, BCPS, [facility], CPP Heart Failure Clinic Pharmacist [phone]

## 2020-08-25 ENCOUNTER — Ambulatory Visit: Payer: PPO | Admitting: Cardiology

## 2020-08-25 ENCOUNTER — Other Ambulatory Visit: Payer: Self-pay

## 2020-08-25 ENCOUNTER — Encounter: Payer: Self-pay | Admitting: Cardiology

## 2020-08-25 VITALS — BP 88/60 | HR 87 | Ht 72.0 in | Wt 294.6 lb

## 2020-08-25 DIAGNOSIS — I951 Orthostatic hypotension: Secondary | ICD-10-CM

## 2020-08-25 DIAGNOSIS — E785 Hyperlipidemia, unspecified: Secondary | ICD-10-CM | POA: Diagnosis not present

## 2020-08-25 DIAGNOSIS — I2510 Atherosclerotic heart disease of native coronary artery without angina pectoris: Secondary | ICD-10-CM

## 2020-08-25 DIAGNOSIS — I420 Dilated cardiomyopathy: Secondary | ICD-10-CM

## 2020-08-25 DIAGNOSIS — G4733 Obstructive sleep apnea (adult) (pediatric): Secondary | ICD-10-CM

## 2020-08-25 DIAGNOSIS — I639 Cerebral infarction, unspecified: Secondary | ICD-10-CM | POA: Diagnosis not present

## 2020-08-25 DIAGNOSIS — I4819 Other persistent atrial fibrillation: Secondary | ICD-10-CM | POA: Diagnosis not present

## 2020-08-25 NOTE — Addendum Note (Signed)
Addended by: Antonieta Iba on: 08/25/2020 10:06 AM   Modules accepted: Orders

## 2020-08-25 NOTE — Patient Instructions (Addendum)
Medication Instructions:  Your physician has recommended you make the following change in your medication:  1) STOP taking Toprol  *If you need a refill on your cardiac medications before your next appointment, please call your pharmacy*  Follow-Up: At [facility], you and your health needs are our priority.  As part of our continuing mission to provide you with exceptional heart care, we have created designated Provider Care Teams.  These Care Teams include your primary Cardiologist (physician) and Advanced Practice Providers (APPs -  Physician Assistants and Nurse Practitioners) who all work together to provide you with the care you need, when you need it.  Your next appointment:   4 week(s)  The format for your next appointment:   In Person  Provider:   You may see [name], MD or one of the following Advanced Practice Providers on your designated Care Team:    Melina Copa, PA-C  Ermalinda Barrios, PA-C  Other Instructions Follow up with At[facility] Fibrillation Clinic on Friday, March 25th at 9:00am  Follow up with [facility]

## 2020-08-25 NOTE — Progress Notes (Signed)
Cardiology Office Note:    Date:  08/25/2020   ID:  Joshua Moran., DOB Feb 28, 1946, MRN [id number]  PCP:  Lawerance Cruel, MD  Cardiologist:  Joshua Him, MD    Referring MD: Lawerance Cruel, MD   Chief Complaint  Patient presents with   Atrial Fibrillation   Hyperlipidemia   Cardiomyopathy   Sleep Apnea    History of Present Illness:    Joshua Moran. is a 75 y.o. male with a hx of coronary calcifications of the LM and RCA, PAF, HLD, GERD and retired Teacher, English as a foreign language for [employer].  He has never smoked but has been followed by Dr. Gwenlyn Found in the past for DOE felt related to poor exercise tolerance from inactivity.  Stress myoview in 2016 was normal and 2D echo in 2019 showed normal LVF with G1DD and mild pulmonary HTN.  He saw Kerin Ransom in 07/2018 and was noted to be in afib by EKG but not started on DOAC as CHADS2VASC score he felt was 1.  Then saw Dr. Gwenlyn Found in 12/2018 for preop clearance for knee surgery and was still in afib and Dr. Gwenlyn Found stated that  His CHADS2VASC score was 1 but will increase to 2 in May 2021 when he turned 74yo. 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  He has recovered fully from his CVA.   He had a Chest CT done in 2016 showing 3v ASCAD. He has a hx of HLD and has been on Crestor 5mg  3 times weekly but has been intolerant to any higher dose.    In followup he was remaining in atrial fibrillation.  It was felt that he should recover from CVA first and then consider DCCV.  Due to coronary artery calcifications on Chest CT, a Lexi myoview was done which showed Fixed defect at the apex/apical lateral/apical inferior walls, without evidence of ischemia. Similar to prior study. EF with global hypokinesis. No evidence of ischemia.  He also has some orthostatic hypotension and wears compression hose occasionally but has not been wearing the abdominal binder.   A sleep study was ordered which showed moderate OSA with an AHI of 26/hr  and was started on auto CPAP from 4-20cm H2O.  He underwent DCCV to NSR in 03/2020.  He is followed in AHF clinic by Dr. Aundra Dubin and has NYHA class 2 symptoms.   He is here today for followup and is doing well.  He has chronic DOE that he thinks has gotten worse.  He denies any  PND, orthopnea, LE edema, palpitations or syncope. He is SOB when he gets up to go to the bathroom.  Since I saw Moran last he has been having increased frequency of dizzy spells.  His BP has been running systolic of [id number].  He is compliant with his meds and is tolerating meds with no SE.    He is doing well with his CPAP device and thinks that he has gotten used to it.  He tolerates the mask and feels the pressure is adequate.  Since going on CPAP he feels rested in the am and has no significant daytime sleepiness.  He denies any significant mouth or nasal dryness or nasal congestion.  He does not think that he snores.    Past Medical History:  Diagnosis Date   Abnormal screening CT of chest    coronary calcium in left main and RCA   Anemia    Arthritis  CHF (congestive heart failure) (HCC)    Complication of anesthesia    Pt reports difficult urinating   Coronary artery calcification seen on CAT scan    no ischemic on nuclear stress test 09/2019   DCM (dilated cardiomyopathy) ([place])    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  Cardiac MRI 2021 with EF 39% and moderate RV dysfunction and non-compaction   Dyspnea    with some activity   GERD (gastroesophageal reflux disease)    occasional   Glaucoma    Hyperlipidemia    statin intolerant at doses > Crestor 5mg  3 times weekly   OSA on CPAP    PAF (paroxysmal atrial fibrillation) ([place])    CHADS2VASC score 2 on apixaban    Past Surgical History:  Procedure Laterality Date   CARDIOVERSION N/A 04/01/2020   Procedure: CARDIOVERSION;  Surgeon: Skeet Latch, MD;  Location: [facility];  Service: Cardiovascular;  Laterality: N/A;   EYE SURGERY      bilateral cataracts   KNEE ARTHROSCOPY W/ DEBRIDEMENT     left    RETINAL DETACHMENT SURGERY Bilateral    RIGHT/LEFT HEART CATH AND CORONARY ANGIOGRAPHY N/A 05/06/2020   Procedure: RIGHT/LEFT HEART CATH AND CORONARY ANGIOGRAPHY;  Surgeon: Larey Dresser, MD;  Location: [facility];  Service: Cardiovascular;  Laterality: N/A;   TOTAL KNEE ARTHROPLASTY Left 01/01/2018   Procedure: LEFT TOTAL KNEE ARTHROPLASTY;  Surgeon: Gaynelle Arabian, MD;  Location: [facility];  Service: Orthopedics;  Laterality: Left;  50 mins   TOTAL KNEE ARTHROPLASTY Right 06/25/2018   Procedure: TOTAL KNEE ARTHROPLASTY;  Surgeon: Gaynelle Arabian, MD;  Location: [facility];  Service: Orthopedics;  Laterality: Right;  96min   VASECTOMY      Current Medications: Current Meds  Medication Sig   amiodarone (PACERONE) 200 MG tablet Take 200 mg by mouth daily.   apixaban (ELIQUIS) 5 MG TABS tablet Take 1 tablet (5 mg total) by mouth 2 (two) times daily.   brimonidine (ALPHAGAN) 0.15 % ophthalmic solution Place 1 drop into the right eye 2 (two) times daily.   cetirizine (ZYRTEC) 10 MG tablet Take 10 mg by mouth every evening.   Cholecalciferol (VITAMIN D3) 50 MCG (2000 UT) TABS Take 2,000 Units by mouth at bedtime.    ferrous sulfate 325 (65 FE) MG tablet Take 325 mg by mouth daily with breakfast.   finasteride (PROSCAR) 5 MG tablet Take 5 mg by mouth every Monday, Wednesday, and Friday.   fluticasone (FLONASE) 50 MCG/ACT nasal spray Place 2 sprays into both nostrils 2 (two) times daily.    Glucosamine 750 MG TABS Take 750 mg by mouth in the morning and at bedtime.    losartan (COZAAR) 25 MG tablet TAKE 1/2 TABLET BY MOUTH ONCE DAILY   Melatonin 10 MG TABS Take 10 mg by mouth at bedtime.    metoprolol succinate (TOPROL XL) 25 MG 24 hr tablet Take 0.5 tablets (12.5 mg total) by mouth at bedtime.   Multiple Vitamins-Minerals (CENTRUM MEN PO) Take 1 tablet by mouth daily.   REPATHA SURECLICK 194 MG/ML SOAJ INJECT  ONE pen into THE SKIN every 14 DAYS   rosuvastatin (CRESTOR) 5 MG tablet Take 5 mg by mouth every Monday, Wednesday, and Friday.   sildenafil (REVATIO) 20 MG tablet Take 40-100 mg by mouth daily as needed (erectile dysfunction.).    spironolactone (ALDACTONE) 25 MG tablet Take 1 tablet (25 mg total) by mouth at bedtime.   tamsulosin (FLOMAX) 0.4 MG CAPS capsule Take 0.4 mg by mouth  at bedtime.      Allergies:   Xarelto [rivaroxaban], Penicillins, Statins, and Vancomycin   Social History   Socioeconomic History   Marital status: Married    Spouse name: Not on file   Number of children: Not on file   Years of education: Not on file   Highest education level: Not on file  Occupational History   Not on file  Tobacco Use   Smoking status: Never Smoker   Smokeless tobacco: Never Used  Vaping Use   Vaping Use: Never used  Substance and Sexual Activity   Alcohol use: Yes    Alcohol/week: 1.0 standard drink    Types: 1 Cans of beer per week    Comment: Rare   Drug use: No   Sexual activity: Not Currently  Other Topics Concern   Not on file  Social History Narrative   Not on file   Social Determinants of Health   Financial Resource Strain: Not on file  Food Insecurity: Not on file  Transportation Needs: Not on file  Physical Activity: Not on file  Stress: Not on file  Social Connections: Not on file     Family History: The patient's family history includes Lung cancer in his mother.  ROS:   Please see the history of present illness.    ROS  All other systems reviewed and negative.   EKGs/Labs/Other Studies Reviewed:    The following studies were reviewed today: Sleep study, labs  EKG:  EKG is ordered today and showed atrial fibrillation with CVR  Recent Labs: 07/24/2020: ALT 20; BUN 12; Creatinine, Ser 1.02; Hemoglobin 13.4; Platelets 207; Potassium 4.5; Sodium 135; TSH 4.038   Recent Lipid Panel    Component Value Date/Time   CHOL 134  05/20/2020 1632   CHOL 127 11/25/2019 1401   TRIG 117 05/20/2020 1632   HDL 45 05/20/2020 1632   HDL 49 11/25/2019 1401   CHOLHDL 3.0 05/20/2020 1632   VLDL 23 05/20/2020 1632   LDLCALC 66 05/20/2020 1632   LDLCALC 60 11/25/2019 1401   LDLDIRECT 62 11/25/2019 1401    Physical Exam:    VS:  BP (!) 88/60    Pulse 87    Ht 6' (1.829 m)    Wt 294 lb 9.6 oz (133.6 kg)    SpO2 95%    BMI 39.95 kg/m     Wt Readings from Last 3 Encounters:  08/25/20 294 lb 9.6 oz (133.6 kg)  08/17/20 295 lb 9.6 oz (134.1 kg)  07/24/20 295 lb 6.4 oz (134 kg)     GEN:  Well nourished, well developed in no acute distress HEENT: Normal NECK: No JVD; No carotid bruits LYMPHATICS: No lymphadenopathy CARDIAC: RRR, no murmurs, rubs, gallops RESPIRATORY:  Clear to auscultation without rales, wheezing or rhonchi  ABDOMEN: Soft, non-tender, non-distended MUSCULOSKELETAL:  No edema; No deformity  SKIN: Warm and dry NEUROLOGIC:  Alert and oriented x 3 PSYCHIATRIC:  Normal affect   ASSESSMENT:    1. Persistent atrial fibrillation ([place])   2. Dyslipidemia, goal LDL below 100   3. Coronary artery calcification seen on CAT scan   4. Acute ischemic stroke ([place])   5. OSA (obstructive sleep apnea)   6. DCM (dilated cardiomyopathy) ([place])   7. Orthostatic hypotension    PLAN:    In order of problems listed above:  1.  Persistent atrial fibrillation -S/P DCCV 03/2020 -unfortunately he is back in atrial fibrillation today with CVR -denies any bleeding problems on apixaban and  no palpitations -continue apixaban 5mg  BID (CHADS2VASC score is 90 - age >46 in a month and CAD on Chest CT) -stop Toprol due to hypotension -continue amiodarone 200mg  daily -SCr stable at 1.02, TSH 4, ALT 20 and Hbg 13.4 in Feb 2022 -PFTs showed mild restriction due to obesity but DLCO was normal -will get in with afib clinic this week>>he does not tolerate afib due to exacerbation of hypotension and SOB so need to try to maintain  NSR  2.  HLD -LDL goal < 70 due to coronary calcifications -last LDL was 66 in Dec 2021 -continue Crestor 5mg  3 times weekly and Repatha  3.  Coronary artery calcifications -Chest CT in 2016 showed 3 vessel coronary artery calcifications -EF 45-50% on echo -cannot get coronary calcium score due to afib -repeat nuclear stress test with no ischemia 09/2019 -no ASA due to DOAC -continue statin -he has had some chest pain that he describes as a pressure that lasts a few minutes but is atypical in that it occurs at rest and exertional and has no associated symptoms of diaphoresis or nausea -he also has had increased SOB which I suspect is related to going back into atrial fibrillation with loss of atrial kick -He has had a nuclear stress test a year ago that showed no ischemia but did show infact -He is able to walk on the treadmill and does not have chest pain when he is on the stress test and his SOB is not very bad on the treadmill>>he does the treadmill for 20 minutes at 41mph -at this time continue medical therapy  4.  CVA -felt cardioembolic from afib -now on lifetime DOAC  5.  OSA - The patient is tolerating PAP therapy well without any problems. The PAP download was reviewed today and showed an AHI of 1.8/hr on auto CPAP with 87% compliance in using more than 4 hours nightly.  The patient has been using and benefiting from PAP use and will continue to benefit from therapy.   6.  DCM -cMRI c/w non-ischemic CM with non-compaction -noted to have coronary calcifications on Chest CT but no ischemia on stress test -cardiac MRI with EF 39% and mild LVH with diffuse HK and no LGE  7.  Orthostatic hypotension -he has not been compliant with using the Abdominal binder -I have encouraged Moran to use his compression hose and abd binder daily when out standing for prolonged periods of time -I will change Moran to thigh high compression hose since he is having problems keeping the knee highs  up   Medication Adjustments/Labs and Tests Ordered: Current medicines are reviewed at length with the patient today.  Concerns regarding medicines are outlined above.  No orders of the defined types were placed in this encounter.  No orders of the defined types were placed in this encounter.   Signed, Joshua Him, MD  08/25/2020 9:23 AM    [place]

## 2020-08-26 DIAGNOSIS — G4733 Obstructive sleep apnea (adult) (pediatric): Secondary | ICD-10-CM | POA: Diagnosis not present

## 2020-08-27 DIAGNOSIS — E7800 Pure hypercholesterolemia, unspecified: Secondary | ICD-10-CM | POA: Diagnosis not present

## 2020-08-27 DIAGNOSIS — I5022 Chronic systolic (congestive) heart failure: Secondary | ICD-10-CM | POA: Diagnosis not present

## 2020-08-27 DIAGNOSIS — I4891 Unspecified atrial fibrillation: Secondary | ICD-10-CM | POA: Diagnosis not present

## 2020-08-27 DIAGNOSIS — D649 Anemia, unspecified: Secondary | ICD-10-CM | POA: Diagnosis not present

## 2020-08-27 DIAGNOSIS — I639 Cerebral infarction, unspecified: Secondary | ICD-10-CM | POA: Diagnosis not present

## 2020-08-27 DIAGNOSIS — M179 Osteoarthritis of knee, unspecified: Secondary | ICD-10-CM | POA: Diagnosis not present

## 2020-08-27 DIAGNOSIS — N401 Enlarged prostate with lower urinary tract symptoms: Secondary | ICD-10-CM | POA: Diagnosis not present

## 2020-08-28 ENCOUNTER — Ambulatory Visit (HOSPITAL_COMMUNITY)
Admission: RE | Admit: 2020-08-28 | Discharge: 2020-08-28 | Disposition: A | Discharge status: Home / Self Care | Payer: PPO | Source: Ambulatory Visit | Attending: Nurse Practitioner | Admitting: Nurse Practitioner

## 2020-08-28 ENCOUNTER — Encounter (HOSPITAL_COMMUNITY): Payer: Self-pay | Admitting: Nurse Practitioner

## 2020-08-28 ENCOUNTER — Other Ambulatory Visit: Payer: Self-pay

## 2020-08-28 VITALS — BP 106/66 | HR 84 | Ht 72.0 in | Wt 297.4 lb

## 2020-08-28 DIAGNOSIS — Z9989 Dependence on other enabling machines and devices: Secondary | ICD-10-CM | POA: Diagnosis not present

## 2020-08-28 DIAGNOSIS — I4819 Other persistent atrial fibrillation: Secondary | ICD-10-CM | POA: Insufficient documentation

## 2020-08-28 DIAGNOSIS — Z96653 Presence of artificial knee joint, bilateral: Secondary | ICD-10-CM | POA: Insufficient documentation

## 2020-08-28 DIAGNOSIS — D6869 Other thrombophilia: Secondary | ICD-10-CM | POA: Diagnosis not present

## 2020-08-28 DIAGNOSIS — Z79899 Other long term (current) drug therapy: Secondary | ICD-10-CM | POA: Diagnosis not present

## 2020-08-28 DIAGNOSIS — Z7901 Long term (current) use of anticoagulants: Secondary | ICD-10-CM | POA: Diagnosis not present

## 2020-08-28 DIAGNOSIS — I420 Dilated cardiomyopathy: Secondary | ICD-10-CM | POA: Insufficient documentation

## 2020-08-28 DIAGNOSIS — G4733 Obstructive sleep apnea (adult) (pediatric): Secondary | ICD-10-CM | POA: Insufficient documentation

## 2020-08-28 MED ORDER — AMIODARONE HCL 200 MG PO TABS: 200.0000 mg | ORAL_TABLET | Freq: Two times a day (BID) | ORAL | Status: DC

## 2020-08-28 NOTE — Progress Notes (Signed)
Primary Care Physician: Lawerance Cruel, MD Referring Physician: Dr. Radford Pax EP: Dr. Tonie Griffith. is a 75 y.o. male with a h/o persistent afib, v tach , dilated cardiomyopathy with an EF of 39%,  seen by Dr. Quentin Ore in fall of 2021 for persistent afib x 2 years. He was felt not to be an ablation candidate and he felt amiodarone was his antiarrythmic of choice. He was loaded on amiodarone and had an successful cardioversion. Dr. Quentin Ore did note that he had a severely dilated  left atrium and it may be difficult for him to maintain SR. When the pt saw Dr. Radford Pax recently he was back in rate controlled afib and was referred here. Ekg now shows afib at 84 bpm. He does have some chronic issues with fatigue, lightheadedness and exertional dyspnea. He feels fatigue was better right after CV last fall. He is sedentary.   Today, he denies symptoms of palpitations, chest pain, shortness of breath, orthopnea, PND, lower extremity edema, dizziness, presyncope, syncope, or neurologic sequela. The patient is tolerating medications without difficulties and is otherwise without complaint today.   Past Medical History:  Diagnosis Date  . Abnormal screening CT of chest    coronary calcium in left main and RCA  . Anemia   . Arthritis   . CHF (congestive heart failure) ([place])   . Complication of anesthesia    Pt reports difficult urinating  . Coronary artery calcification seen on CAT scan    no ischemic on nuclear stress test 09/2019  . 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  Cardiac MRI 2021 with EF 39% and moderate RV dysfunction and non-compaction  . Dyspnea    with some activity  . GERD (gastroesophageal reflux disease)    occasional  . Glaucoma   . Hyperlipidemia    statin intolerant at doses > Crestor 5mg  3 times weekly  . OSA on CPAP   . PAF (paroxysmal atrial fibrillation) ([facility])    CHADS2VASC score 2 on apixaban   Past Surgical History:   Procedure Laterality Date  . CARDIOVERSION N/A 04/01/2020   Procedure: CARDIOVERSION;  Surgeon: Skeet Latch, MD;  Location: [facility];  Service: Cardiovascular;  Laterality: N/A;  . EYE SURGERY     bilateral cataracts  . KNEE ARTHROSCOPY W/ DEBRIDEMENT     left   . RETINAL DETACHMENT SURGERY Bilateral   . RIGHT/LEFT HEART CATH AND CORONARY ANGIOGRAPHY N/A 05/06/2020   Procedure: RIGHT/LEFT HEART CATH AND CORONARY ANGIOGRAPHY;  Surgeon: Larey Dresser, MD;  Location: [facility];  Service: Cardiovascular;  Laterality: N/A;  . TOTAL KNEE ARTHROPLASTY Left 01/01/2018   Procedure: LEFT TOTAL KNEE ARTHROPLASTY;  Surgeon: Gaynelle Arabian, MD;  Location: [facility];  Service: Orthopedics;  Laterality: Left;  50 mins  . TOTAL KNEE ARTHROPLASTY Right 06/25/2018   Procedure: TOTAL KNEE ARTHROPLASTY;  Surgeon: Gaynelle Arabian, MD;  Location: [facility];  Service: Orthopedics;  Laterality: Right;  80min  . VASECTOMY      Current Outpatient Medications  Medication Sig Dispense Refill  . amiodarone (PACERONE) 200 MG tablet Take 200 mg by mouth daily.    Marland Kitchen apixaban (ELIQUIS) 5 MG TABS tablet Take 1 tablet (5 mg total) by mouth 2 (two) times daily. 60 tablet 0  . brimonidine (ALPHAGAN) 0.15 % ophthalmic solution Place 1 drop into the right eye 2 (two) times daily.    . cetirizine (ZYRTEC) 10 MG tablet Take 10 mg by  mouth every evening.    . Cholecalciferol (VITAMIN D3) 50 MCG (2000 UT) TABS Take 2,000 Units by mouth at bedtime.     . ferrous sulfate 325 (65 FE) MG tablet Take 325 mg by mouth daily with breakfast.    . finasteride (PROSCAR) 5 MG tablet Take 5 mg by mouth every Monday, Wednesday, and Friday.    . fluticasone (FLONASE) 50 MCG/ACT nasal spray Place 2 sprays into both nostrils 2 (two) times daily.     . Glucosamine 750 MG TABS Take 750 mg by mouth in the morning and at bedtime.     Marland Kitchen losartan (COZAAR) 25 MG tablet TAKE 1/2 TABLET BY MOUTH ONCE DAILY 45 tablet 3  . Melatonin 10 MG TABS  Take 10 mg by mouth at bedtime.     . Multiple Vitamins-Minerals (CENTRUM MEN PO) Take 1 tablet by mouth daily.    Marland Kitchen REPATHA SURECLICK 193 MG/ML SOAJ INJECT ONE pen into THE SKIN every 14 DAYS 6 mL 3  . rosuvastatin (CRESTOR) 5 MG tablet Take 5 mg by mouth every Monday, Wednesday, and Friday.    . sildenafil (REVATIO) 20 MG tablet Take 40-100 mg by mouth daily as needed (erectile dysfunction.).   5  . spironolactone (ALDACTONE) 25 MG tablet Take 1 tablet (25 mg total) by mouth at bedtime. 90 tablet 3  . tamsulosin (FLOMAX) 0.4 MG CAPS capsule Take 0.4 mg by mouth at bedtime.      No current facility-administered medications for this encounter.    Allergies  Allergen Reactions  . Xarelto [Rivaroxaban] Rash    Patient denies allergy as of 08/07/19  . Empagliflozin     Other reaction(s): dizziness  . Penicillins Other (See Comments)    Very flushed  Has patient had a PCN reaction causing immediate rash, facial/tongue/throat swelling, SOB or lightheadedness with hypotension: No Has patient had a PCN reaction causing severe rash involving mucus membranes or skin necrosis: No Has patient had a PCN reaction that required hospitalization No Has patient had a PCN reaction occurring within the last 10 years: No If all of the above answers are "NO", then may proceed with Cephalosporin use.   . Statins Other (See Comments)    Pt c/o muscle aches with use of statins.  . Vancomycin Rash    Social History   Socioeconomic History  . Marital status: Married    Spouse name: Not on file  . Number of children: Not on file  . Years of education: Not on file  . Highest education level: Not on file  Occupational History  . Not on file  Tobacco Use  . Smoking status: Never Smoker  . Smokeless tobacco: Never Used  Vaping Use  . Vaping Use: Never used  Substance and Sexual Activity  . Alcohol use: Yes    Alcohol/week: 1.0 standard drink    Types: 1 Cans of beer per week    Comment: Rare  . Drug  use: No  . Sexual activity: Not Currently  Other Topics Concern  . Not on file  Social History Narrative  . Not on file   Social Determinants of Health   Financial Resource Strain: Not on file  Food Insecurity: Not on file  Transportation Needs: Not on file  Physical Activity: Not on file  Stress: Not on file  Social Connections: Not on file  Intimate Partner Violence: Not on file    Family History  Problem Relation Age of Onset  . Lung cancer Mother  smoker    ROS- All systems are reviewed and negative except as per the HPI above  Physical Exam: There were no vitals filed for this visit. Wt Readings from Last 3 Encounters:  08/25/20 133.6 kg  08/17/20 134.1 kg  07/24/20 134 kg    Labs: Lab Results  Component Value Date   NA 135 07/24/2020   K 4.5 07/24/2020   CL 103 07/24/2020   CO2 24 07/24/2020   GLUCOSE 99 07/24/2020   BUN 12 07/24/2020   CREATININE 1.02 07/24/2020   CALCIUM 8.9 07/24/2020   PHOS 4.0 08/07/2019   MG 2.0 08/07/2019   Lab Results  Component Value Date   INR 1.0 08/06/2019   Lab Results  Component Value Date   CHOL 134 05/20/2020   HDL 45 05/20/2020   LDLCALC 66 05/20/2020   TRIG 117 05/20/2020     GEN- The patient is well appearing, alert and oriented x 3 today.   Head- normocephalic, atraumatic Eyes-  Sclera clear, conjunctiva pink Ears- hearing intact Oropharynx- clear Neck- supple, no JVP Lymph- no cervical lymphadenopathy Lungs- Clear to ausculation bilaterally, normal work of breathing Heart- Regular rate and rhythm, no murmurs, rubs or gallops, PMI not laterally displaced GI- soft, NT, ND, + BS Extremities- no clubbing, cyanosis, or edema MS- no significant deformity or atrophy Skin- no rash or lesion Psych- euthymic mood, full affect Neuro- strength and sensation are intact  EKG- afib at 84 bpm, qrs int 106 ms, qtc 463 ms  Epic records reviewed      Assessment and Plan: 1. Long standing persistent afib   H/o of persistent afib x 2 years prior to being evaluated by Dr. Quentin Ore last fall Enjoyed SR with amiodarone loading and cardioversion in October until recent  Per Dr. Quentin Ore may be difficult to keep in SR for severly dilated left atrium  He would like another attempt to restore SR as he feels he has more energy in SR  He does not tolerate BB's Will increase amiodarone to 200 mg bid and set up CV April 5th, he will come by afib clinic am of cardioversion for  labs and EKG to make sure he is not returned to SR with the extra loading of amiodarone.  Tsh/cmt/cbc am of cardioversion/covid testing scheduled    2. CHA2DS2VASc score of at least 6 States no missed doses of eliquis 5 mg bid, reminded not to miss does with pending cardioversion   3. OSA Continue CPAP  I will see back one week after CV  Donna C. Carroll, [facility] [address] [place], [place] [address] [phone]

## 2020-08-28 NOTE — Patient Instructions (Signed)
Cardioversion scheduled for Tuesday, April 5th  - Come to afib clinic for labs/ekg at [place] at the [facility] and go to admitting at 1030AM  - Do not eat or drink anything after midnight the night prior to your procedure.  - Take all your morning medication (except diabetic medications) with a sip of water prior to arrival.  - You will not be able to drive home after your procedure.  - Do NOT miss any doses of your blood thinner - if you should miss a dose please notify our office immediately.  - If you feel as if you go back into normal rhythm prior to scheduled cardioversion, please notify our office immediately. If your procedure is canceled in the cardioversion suite you will be charged a cancellation fee.  I  ncrease Amiodarone to 200mg  twice a day until follow up

## 2020-08-28 NOTE — H&P (View-Only) (Signed)
Primary Care Physician: Lawerance Cruel, MD Referring Physician: Dr. Radford Pax EP: Dr. Tonie Griffith. is a 75 y.o. male with a h/o persistent afib, v tach , dilated cardiomyopathy with an EF of 39%,  seen by Dr. Quentin Ore in fall of 2021 for persistent afib x 2 years. He was felt not to be an ablation candidate and he felt amiodarone was his antiarrythmic of choice. He was loaded on amiodarone and had an successful cardioversion. Dr. Quentin Ore did note that he had a severely dilated  left atrium and it may be difficult for him to maintain SR. When the pt saw Dr. Radford Pax recently he was back in rate controlled afib and was referred here. Ekg now shows afib at 84 bpm. He does have some chronic issues with fatigue, lightheadedness and exertional dyspnea. He feels fatigue was better right after CV last fall. He is sedentary.   Today, he denies symptoms of palpitations, chest pain, shortness of breath, orthopnea, PND, lower extremity edema, dizziness, presyncope, syncope, or neurologic sequela. The patient is tolerating medications without difficulties and is otherwise without complaint today.   Past Medical History:  Diagnosis Date  . Abnormal screening CT of chest    coronary calcium in left main and RCA  . Anemia   . Arthritis   . CHF (congestive heart failure) ([place])   . Complication of anesthesia    Pt reports difficult urinating  . Coronary artery calcification seen on CAT scan    no ischemic on nuclear stress test 09/2019  . 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  Cardiac MRI 2021 with EF 39% and moderate RV dysfunction and non-compaction  . Dyspnea    with some activity  . GERD (gastroesophageal reflux disease)    occasional  . Glaucoma   . Hyperlipidemia    statin intolerant at doses > Crestor 5mg  3 times weekly  . OSA on CPAP   . PAF (paroxysmal atrial fibrillation) ([facility])    CHADS2VASC score 2 on apixaban   Past Surgical History:   Procedure Laterality Date  . CARDIOVERSION N/A 04/01/2020   Procedure: CARDIOVERSION;  Surgeon: Skeet Latch, MD;  Location: [facility];  Service: Cardiovascular;  Laterality: N/A;  . EYE SURGERY     bilateral cataracts  . KNEE ARTHROSCOPY W/ DEBRIDEMENT     left   . RETINAL DETACHMENT SURGERY Bilateral   . RIGHT/LEFT HEART CATH AND CORONARY ANGIOGRAPHY N/A 05/06/2020   Procedure: RIGHT/LEFT HEART CATH AND CORONARY ANGIOGRAPHY;  Surgeon: Larey Dresser, MD;  Location: [facility];  Service: Cardiovascular;  Laterality: N/A;  . TOTAL KNEE ARTHROPLASTY Left 01/01/2018   Procedure: LEFT TOTAL KNEE ARTHROPLASTY;  Surgeon: Gaynelle Arabian, MD;  [facility];  Service: Orthopedics;  Laterality: Left;  50 mins  . TOTAL KNEE ARTHROPLASTY Right 06/25/2018   Procedure: TOTAL KNEE ARTHROPLASTY;  Surgeon: Gaynelle Arabian, MD;  [facility];  Service: Orthopedics;  Laterality: Right;  39min  . VASECTOMY      Current Outpatient Medications  Medication Sig Dispense Refill  . amiodarone (PACERONE) 200 MG tablet Take 200 mg by mouth daily.    Marland Kitchen apixaban (ELIQUIS) 5 MG TABS tablet Take 1 tablet (5 mg total) by mouth 2 (two) times daily. 60 tablet 0  . brimonidine (ALPHAGAN) 0.15 % ophthalmic solution Place 1 drop into the right eye 2 (two) times daily.    . cetirizine (ZYRTEC) 10 MG tablet Take 10 mg by  mouth every evening.    . Cholecalciferol (VITAMIN D3) 50 MCG (2000 UT) TABS Take 2,000 Units by mouth at bedtime.     . ferrous sulfate 325 (65 FE) MG tablet Take 325 mg by mouth daily with breakfast.    . finasteride (PROSCAR) 5 MG tablet Take 5 mg by mouth every Monday, Wednesday, and Friday.    . fluticasone (FLONASE) 50 MCG/ACT nasal spray Place 2 sprays into both nostrils 2 (two) times daily.     . Glucosamine 750 MG TABS Take 750 mg by mouth in the morning and at bedtime.     Marland Kitchen losartan (COZAAR) 25 MG tablet TAKE 1/2 TABLET BY MOUTH ONCE DAILY 45 tablet 3  . Melatonin 10 MG TABS  Take 10 mg by mouth at bedtime.     . Multiple Vitamins-Minerals (CENTRUM MEN PO) Take 1 tablet by mouth daily.    Marland Kitchen REPATHA SURECLICK 709 MG/ML SOAJ INJECT ONE pen into THE SKIN every 14 DAYS 6 mL 3  . rosuvastatin (CRESTOR) 5 MG tablet Take 5 mg by mouth every Monday, Wednesday, and Friday.    . sildenafil (REVATIO) 20 MG tablet Take 40-100 mg by mouth daily as needed (erectile dysfunction.).   5  . spironolactone (ALDACTONE) 25 MG tablet Take 1 tablet (25 mg total) by mouth at bedtime. 90 tablet 3  . tamsulosin (FLOMAX) 0.4 MG CAPS capsule Take 0.4 mg by mouth at bedtime.      No current facility-administered medications for this encounter.    Allergies  Allergen Reactions  . Xarelto [Rivaroxaban] Rash    Patient denies allergy as of 08/07/19  . Empagliflozin     Other reaction(s): dizziness  . Penicillins Other (See Comments)    Very flushed  Has patient had a PCN reaction causing immediate rash, facial/tongue/throat swelling, SOB or lightheadedness with hypotension: No Has patient had a PCN reaction causing severe rash involving mucus membranes or skin necrosis: No Has patient had a PCN reaction that required hospitalization No Has patient had a PCN reaction occurring within the last 10 years: No If all of the above answers are "NO", then may proceed with Cephalosporin use.   . Statins Other (See Comments)    Pt c/o muscle aches with use of statins.  . Vancomycin Rash    Social History   Socioeconomic History  . Marital status: Married    Spouse name: Not on file  . Number of children: Not on file  . Years of education: Not on file  . Highest education level: Not on file  Occupational History  . Not on file  Tobacco Use  . Smoking status: Never Smoker  . Smokeless tobacco: Never Used  Vaping Use  . Vaping Use: Never used  Substance and Sexual Activity  . Alcohol use: Yes    Alcohol/week: 1.0 standard drink    Types: 1 Cans of beer per week    Comment: Rare  . Drug  use: No  . Sexual activity: Not Currently  Other Topics Concern  . Not on file  Social History Narrative  . Not on file   Social Determinants of Health   Financial Resource Strain: Not on file  Food Insecurity: Not on file  Transportation Needs: Not on file  Physical Activity: Not on file  Stress: Not on file  Social Connections: Not on file  Intimate Partner Violence: Not on file    Family History  Problem Relation Age of Onset  . Lung cancer Mother  smoker    ROS- All systems are reviewed and negative except as per the HPI above  Physical Exam: There were no vitals filed for this visit. Wt Readings from Last 3 Encounters:  08/25/20 133.6 kg  08/17/20 134.1 kg  07/24/20 134 kg    Labs: Lab Results  Component Value Date   NA 135 07/24/2020   K 4.5 07/24/2020   CL 103 07/24/2020   CO2 24 07/24/2020   GLUCOSE 99 07/24/2020   BUN 12 07/24/2020   CREATININE 1.02 07/24/2020   CALCIUM 8.9 07/24/2020   PHOS 4.0 08/07/2019   MG 2.0 08/07/2019   Lab Results  Component Value Date   INR 1.0 08/06/2019   Lab Results  Component Value Date   CHOL 134 05/20/2020   HDL 45 05/20/2020   LDLCALC 66 05/20/2020   TRIG 117 05/20/2020     GEN- The patient is well appearing, alert and oriented x 3 today.   Head- normocephalic, atraumatic Eyes-  Sclera clear, conjunctiva pink Ears- hearing intact Oropharynx- clear Neck- supple, no JVP Lymph- no cervical lymphadenopathy Lungs- Clear to ausculation bilaterally, normal work of breathing Heart- Regular rate and rhythm, no murmurs, rubs or gallops, PMI not laterally displaced GI- soft, NT, ND, + BS Extremities- no clubbing, cyanosis, or edema MS- no significant deformity or atrophy Skin- no rash or lesion Psych- euthymic mood, full affect Neuro- strength and sensation are intact  EKG- afib at 84 bpm, qrs int 106 ms, qtc 463 ms  Epic records reviewed      Assessment and Plan: 1. Long standing persistent afib   H/o of persistent afib x 2 years prior to being evaluated by Dr. Quentin Ore last fall Enjoyed SR with amiodarone loading and cardioversion in October until recent  Per Dr. Quentin Ore may be difficult to keep in SR for severly dilated left atrium  He would like another attempt to restore SR as he feels he has more energy in SR  He does not tolerate BB's Will increase amiodarone to 200 mg bid and set up CV April 5th, he will come by afib clinic am of cardioversion for  labs and EKG to make sure he is not returned to SR with the extra loading of amiodarone.  Tsh/cmt/cbc am of cardioversion/covid testing scheduled    2. CHA2DS2VASc score of at least 6 States no missed doses of eliquis 5 mg bid, reminded not to miss does with pending cardioversion   3. OSA Continue CPAP  I will see back one week after CV  Treavor Blomquist C. Elai Vanwyk, [facility] [address] [place], [place] [address] [phone]

## 2020-09-05 ENCOUNTER — Other Ambulatory Visit (HOSPITAL_COMMUNITY)
Admission: RE | Admit: 2020-09-05 | Discharge: 2020-09-05 | Disposition: A | Discharge status: Home / Self Care | Payer: PPO | Source: Ambulatory Visit | Attending: Internal Medicine | Admitting: Internal Medicine

## 2020-09-05 DIAGNOSIS — Z01812 Encounter for preprocedural laboratory examination: Secondary | ICD-10-CM | POA: Diagnosis not present

## 2020-09-05 DIAGNOSIS — Z20822 Contact with and (suspected) exposure to covid-19: Secondary | ICD-10-CM | POA: Insufficient documentation

## 2020-09-05 LAB — SARS CORONAVIRUS 2 (TAT 6-24 HRS): SARS Coronavirus 2: NEGATIVE

## 2020-09-07 ENCOUNTER — Other Ambulatory Visit (HOSPITAL_COMMUNITY): Payer: Self-pay | Admitting: *Deleted

## 2020-09-08 ENCOUNTER — Ambulatory Visit (HOSPITAL_COMMUNITY)
Admission: RE | Admit: 2020-09-08 | Discharge: 2020-09-08 | Disposition: A | Discharge status: Home / Self Care | Payer: PPO | Source: Ambulatory Visit | Attending: Physician Assistant | Admitting: Physician Assistant

## 2020-09-08 ENCOUNTER — Ambulatory Visit (HOSPITAL_COMMUNITY): Payer: PPO | Admitting: Certified Registered Nurse Anesthetist

## 2020-09-08 ENCOUNTER — Other Ambulatory Visit: Payer: Self-pay

## 2020-09-08 ENCOUNTER — Ambulatory Visit (HOSPITAL_COMMUNITY)
Admission: RE | Admit: 2020-09-08 | Discharge: 2020-09-08 | Disposition: A | Discharge status: Home / Self Care | Payer: PPO | Attending: Internal Medicine | Admitting: Internal Medicine

## 2020-09-08 ENCOUNTER — Encounter (HOSPITAL_COMMUNITY): Payer: Self-pay | Admitting: Internal Medicine

## 2020-09-08 ENCOUNTER — Encounter (HOSPITAL_COMMUNITY)
Admission: RE | Disposition: A | Discharge status: Home / Self Care | Payer: Self-pay | Source: Home / Self Care | Attending: Internal Medicine

## 2020-09-08 DIAGNOSIS — I420 Dilated cardiomyopathy: Secondary | ICD-10-CM | POA: Diagnosis not present

## 2020-09-08 DIAGNOSIS — Z7901 Long term (current) use of anticoagulants: Secondary | ICD-10-CM | POA: Diagnosis not present

## 2020-09-08 DIAGNOSIS — Z888 Allergy status to other drugs, medicaments and biological substances status: Secondary | ICD-10-CM | POA: Diagnosis not present

## 2020-09-08 DIAGNOSIS — I4811 Longstanding persistent atrial fibrillation: Secondary | ICD-10-CM

## 2020-09-08 DIAGNOSIS — Z79899 Other long term (current) drug therapy: Secondary | ICD-10-CM | POA: Diagnosis not present

## 2020-09-08 DIAGNOSIS — Z9989 Dependence on other enabling machines and devices: Secondary | ICD-10-CM | POA: Diagnosis not present

## 2020-09-08 DIAGNOSIS — G4733 Obstructive sleep apnea (adult) (pediatric): Secondary | ICD-10-CM | POA: Insufficient documentation

## 2020-09-08 DIAGNOSIS — I447 Left bundle-branch block, unspecified: Secondary | ICD-10-CM | POA: Insufficient documentation

## 2020-09-08 DIAGNOSIS — I480 Paroxysmal atrial fibrillation: Secondary | ICD-10-CM | POA: Diagnosis not present

## 2020-09-08 DIAGNOSIS — Z881 Allergy status to other antibiotic agents status: Secondary | ICD-10-CM | POA: Diagnosis not present

## 2020-09-08 DIAGNOSIS — Z880 Allergy status to penicillin: Secondary | ICD-10-CM | POA: Diagnosis not present

## 2020-09-08 DIAGNOSIS — I4819 Other persistent atrial fibrillation: Secondary | ICD-10-CM | POA: Diagnosis not present

## 2020-09-08 HISTORY — PX: CARDIOVERSION: SHX1299

## 2020-09-08 LAB — COMPREHENSIVE METABOLIC PANEL
ALT: 18 U/L (ref 0–44)
AST: 20 U/L (ref 15–41)
Albumin: 3.4 g/dL — ABNORMAL LOW (ref 3.5–5.0)
Alkaline Phosphatase: 59 U/L (ref 38–126)
Anion gap: 3 — ABNORMAL LOW (ref 5–15)
BUN: 17 mg/dL (ref 8–23)
CO2: 27 mmol/L (ref 22–32)
Calcium: 8.9 mg/dL (ref 8.9–10.3)
Chloride: 110 mmol/L (ref 98–111)
Creatinine, Ser: 1.14 mg/dL (ref 0.61–1.24)
GFR, Estimated: >60 mL/min (ref >60)
Glucose, Bld: 93 mg/dL (ref 70–99)
Potassium: 4.5 mmol/L (ref 3.5–5.1)
Sodium: 140 mmol/L (ref 135–145)
Total Bilirubin: 0.7 mg/dL (ref 0.3–1.2)
Total Protein: 6.4 g/dL — ABNORMAL LOW (ref 6.5–8.1)

## 2020-09-08 LAB — POCT I-STAT, CHEM 8
BUN: 28 mg/dL — ABNORMAL HIGH (ref 8–23)
Calcium, Ion: 1.08 mmol/L — ABNORMAL LOW (ref 1.15–1.40)
Chloride: 108 mmol/L (ref 98–111)
Creatinine, Ser: 1.1 mg/dL (ref 0.61–1.24)
Glucose, Bld: 91 mg/dL (ref 70–99)
HCT: 43 % (ref 39.0–52.0)
Hemoglobin: 14.6 g/dL (ref 13.0–17.0)
Potassium: 7.7 mmol/L (ref 3.5–5.1)
Sodium: 139 mmol/L (ref 135–145)
TCO2: 26 mmol/L (ref 22–32)

## 2020-09-08 LAB — CBC
HCT: 43 % (ref 39.0–52.0)
Hemoglobin: 13.8 g/dL (ref 13.0–17.0)
MCH: 31.3 pg (ref 26.0–34.0)
MCHC: 32.1 g/dL (ref 30.0–36.0)
MCV: 97.5 fL (ref 80.0–100.0)
Platelets: 224 10*3/uL (ref 150–400)
RBC: 4.41 MIL/uL (ref 4.22–5.81)
RDW: 13.8 % (ref 11.5–15.5)
WBC: 5.4 10*3/uL (ref 4.0–10.5)
nRBC: 0 % (ref 0.0–0.2)

## 2020-09-08 LAB — TSH: TSH: 3.638 u[IU]/mL (ref 0.350–4.500)

## 2020-09-08 SURGERY — CARDIOVERSION
Anesthesia: General

## 2020-09-08 MED ORDER — SODIUM CHLORIDE 0.9 % IV SOLN: INTRAVENOUS | Status: DC | PRN

## 2020-09-08 MED ORDER — LIDOCAINE 2% (20 MG/ML) 5 ML SYRINGE
INTRAMUSCULAR | Status: DC | PRN
  Administered 2020-09-08: 60 mg via INTRAVENOUS

## 2020-09-08 MED ORDER — PHENYLEPHRINE 40 MCG/ML (10ML) SYRINGE FOR IV PUSH (FOR BLOOD PRESSURE SUPPORT)
PREFILLED_SYRINGE | INTRAVENOUS | Status: DC | PRN
  Administered 2020-09-08: 80 ug via INTRAVENOUS
  Administered 2020-09-08: 40 ug via INTRAVENOUS

## 2020-09-08 MED ORDER — PROPOFOL 10 MG/ML IV BOLUS
INTRAVENOUS | Status: DC | PRN
  Administered 2020-09-08: 70 mg via INTRAVENOUS
  Administered 2020-09-08: 60 mg via INTRAVENOUS

## 2020-09-08 NOTE — Anesthesia Preprocedure Evaluation (Addendum)
Anesthesia Evaluation  Patient identified by MRN, date of birth, ID band Patient awake    Reviewed: Allergy & Precautions, NPO status , Patient's Chart, lab work & pertinent test results  History of Anesthesia Complications Negative for: history of anesthetic complications  Airway Mallampati: II  TM Distance: >3 FB Neck ROM: Full    Dental no notable dental hx.    Pulmonary sleep apnea and Continuous Positive Airway Pressure Ventilation ,    Pulmonary exam normal        Cardiovascular hypertension, Pt. on medications + CAD and +CHF  Normal cardiovascular exam+ dysrhythmias (on Eliquis and amiodarone) Atrial Fibrillation   TTE 08/2019: EF 45-50%, global hypokinesis, mild LVH, mildly reduced RV systolic function, mild LAE, valves ok   Neuro/Psych CVA negative psych ROS   GI/Hepatic Neg liver ROS, GERD  Controlled,  Endo/Other  Morbid obesity  Renal/GU negative Renal ROS  negative genitourinary   Musculoskeletal  (+) Arthritis ,   Abdominal   Peds  Hematology negative hematology ROS (+)   Anesthesia Other Findings Day of surgery medications reviewed with patient.  Reproductive/Obstetrics negative OB ROS                           Anesthesia Physical Anesthesia Plan  ASA: III  Anesthesia Plan: General   Post-op Pain Management:    Induction: Intravenous  PONV Risk Score and Plan: Treatment may vary due to age or medical condition and Propofol infusion  Airway Management Planned: Mask  Additional Equipment: None  Intra-op Plan:   Post-operative Plan:   Informed Consent: I have reviewed the patients History and Physical, chart, labs and discussed the procedure including the risks, benefits and alternatives for the proposed anesthesia with the patient or authorized representative who has indicated his/her understanding and acceptance.       Plan Discussed with: CRNA  Anesthesia  Plan Comments:        Anesthesia Quick Evaluation

## 2020-09-08 NOTE — Interval H&P Note (Signed)
History and Physical Interval Note:  09/08/2020 11:33 AM  Joshua Moran.  has presented today for surgery, with the diagnosis of AFIB.  The various methods of treatment have been discussed with the patient and family. After consideration of risks, benefits and other options for treatment, the patient has consented to  Procedure(s): CARDIOVERSION (N/A) as a surgical intervention.  The patient's history has been reviewed, patient examined, no change in status, stable for surgery.  I have reviewed the patient's chart and labs.  Questions were answered to the patient's satisfaction.     Pixie Casino

## 2020-09-08 NOTE — H&P (View-Only) (Signed)
Patient returns for ECG and blood work prior to [place]. ECG shows rate controlled afib, LBBB, HR 91, QRS 168, QTc 531. Proceed with DCCV as scheduled. F/u in AF clinic as scheduled.

## 2020-09-08 NOTE — Transfer of Care (Signed)
Immediate Anesthesia Transfer of Care Note  Patient: Joshua Moran.  Procedure(s) Performed: CARDIOVERSION (N/A )  Patient Location: Endoscopy Unit  Anesthesia Type:General  Level of Consciousness: awake and patient cooperative  Airway & Oxygen Therapy: Patient Spontanous Breathing  Post-op Assessment: Report given to RN and Post -op Vital signs reviewed and stable  Post vital signs: Reviewed  Last Vitals:  Vitals Value Taken Time  BP 103/58   Temp    Pulse 68 09/08/20 1150  Resp 16 09/08/20 1150  SpO2 98 % 09/08/20 1150    Last Pain:  Vitals:   09/08/20 1030  TempSrc: Temporal  PainSc: 0-No pain         Complications: No complications documented.

## 2020-09-08 NOTE — Anesthesia Procedure Notes (Signed)
Procedure Name: General with mask airway Date/Time: 09/08/2020 11:35 AM Performed by: Janene Harvey, CRNA Pre-anesthesia Checklist: Patient identified, Emergency Drugs available, Suction available and Patient being monitored Patient Re-evaluated:Patient Re-evaluated prior to induction Oxygen Delivery Method: Ambu bag Preoxygenation: Pre-oxygenation with 100% oxygen Induction Type: IV induction Placement Confirmation: positive ETCO2 Dental Injury: Teeth and Oropharynx as per pre-operative assessment

## 2020-09-08 NOTE — Discharge Instructions (Signed)
Electrical Cardioversion Electrical cardioversion is the delivery of a jolt of electricity to restore a normal rhythm to the heart. A rhythm that is too fast or is not regular keeps the heart from pumping well. In this procedure, sticky patches or metal paddles are placed on the chest to deliver electricity to the heart from a device. This procedure may be done in an emergency if:  There is low or no blood pressure as a result of the heart rhythm.  Normal rhythm must be restored as fast as possible to protect the brain and heart from further damage.  It may save a life. This may also be a scheduled procedure for irregular or fast heart rhythms that are not immediately life-threatening. Tell a health care provider about:  Any allergies you have.  All medicines you are taking, including vitamins, herbs, eye drops, creams, and over-the-counter medicines.  Any problems you or family members have had with anesthetic medicines.  Any blood disorders you have.  Any surgeries you have had.  Any medical conditions you have.  Whether you are pregnant or may be pregnant. What are the risks? Generally, this is a safe procedure. However, problems may occur, including:  Allergic reactions to medicines.  A blood clot that breaks free and travels to other parts of your body.  The possible return of an abnormal heart rhythm within hours or days after the procedure.  Your heart stopping (cardiac arrest). This is rare. What happens before the procedure? Medicines  Your health care provider may have you start taking: ? Blood-thinning medicines (anticoagulants) so your blood does not clot as easily. ? Medicines to help stabilize your heart rate and rhythm.  Ask your health care provider about: ? Changing or stopping your regular medicines. This is especially important if you are taking diabetes medicines or blood thinners. ? Taking medicines such as aspirin and ibuprofen. These medicines can  thin your blood. Do not take these medicines unless your health care provider tells you to take them. ? Taking over-the-counter medicines, vitamins, herbs, and supplements. General instructions  Follow instructions from your health care provider about eating or drinking restrictions.  Plan to have someone take you home from the hospital or clinic.  If you will be going home right after the procedure, plan to have someone with you for 24 hours.  Ask your health care provider what steps will be taken to help prevent infection. These may include washing your skin with a germ-killing soap. What happens during the procedure?  An IV will be inserted into one of your veins.  Sticky patches (electrodes) or metal paddles may be placed on your chest.  You will be given a medicine to help you relax (sedative).  An electrical shock will be delivered. The procedure may vary among health care providers and hospitals.   What can I expect after the procedure?  Your blood pressure, heart rate, breathing rate, and blood oxygen level will be monitored until you leave the hospital or clinic.  Your heart rhythm will be watched to make sure it does not change.  You may have some redness on the skin where the shocks were given. Follow these instructions at home:  Do not drive for 24 hours if you were given a sedative during your procedure.  Take over-the-counter and prescription medicines only as told by your health care provider.  Ask your health care provider how to check your pulse. Check it often.  Rest for 48 hours after the procedure   or as told by your health care provider.  Avoid or limit your caffeine use as told by your health care provider.  Keep all follow-up visits as told by your health care provider. This is important. Contact a health care provider if:  You feel like your heart is beating too quickly or your pulse is not regular.  You have a serious muscle cramp that does not go  away. Get help right away if:  You have discomfort in your chest.  You are dizzy or you feel faint.  You have trouble breathing or you are short of breath.  Your speech is slurred.  You have trouble moving an arm or leg on one side of your body.  Your fingers or toes turn cold or blue. Summary  Electrical cardioversion is the delivery of a jolt of electricity to restore a normal rhythm to the heart.  This procedure may be done right away in an emergency or may be a scheduled procedure if the condition is not an emergency.  Generally, this is a safe procedure.  After the procedure, check your pulse often as told by your health care provider. This information is not intended to replace advice given to you by your health care provider. Make sure you discuss any questions you have with your health care provider. Document Revised: 12/24/2018 Document Reviewed: 12/24/2018 Elsevier Patient Education  2021 Elsevier Inc.  

## 2020-09-08 NOTE — Anesthesia Postprocedure Evaluation (Signed)
Anesthesia Post Note  Patient: Joshua Moran.  Procedure(s) Performed: CARDIOVERSION (N/A )     Patient location during evaluation: PACU Anesthesia Type: General Level of consciousness: awake and alert and oriented Pain management: pain level controlled Vital Signs Assessment: post-procedure vital signs reviewed and stable Respiratory status: spontaneous breathing, nonlabored ventilation and respiratory function stable Cardiovascular status: blood pressure returned to baseline Postop Assessment: no apparent nausea or vomiting Anesthetic complications: no   No complications documented.  Last Vitals:  Vitals:   09/08/20 1200 09/08/20 1210  BP: 99/61 112/72  Pulse: 67 (!) 57  Resp: 12 13  Temp:    SpO2: 95% 99%    Last Pain:  Vitals:   09/08/20 1210  TempSrc:   PainSc: 0-No pain                 [name]

## 2020-09-08 NOTE — Progress Notes (Signed)
Patient returns for ECG and blood work prior to [place]. ECG shows rate controlled afib, LBBB, HR 91, QRS 168, QTc 531. Proceed with DCCV as scheduled. F/u in AF clinic as scheduled.

## 2020-09-08 NOTE — CV Procedure (Signed)
   CARDIOVERSION NOTE  Procedure: Electrical Cardioversion Indications:  Atrial Fibrillation  Procedure Details:  Consent: Risks of procedure as well as the alternatives and risks of each were explained to the (patient/caregiver).  Consent for procedure obtained.  Time Out: Verified patient identification, verified procedure, site/side was marked, verified correct patient position, special equipment/implants available, medications/allergies/relevent history reviewed, required imaging and test results available.  Performed  Patient placed on cardiac monitor, pulse oximetry, supplemental oxygen as necessary.  Sedation given: propofol per anesthesia Pacer pads placed anterior and posterior chest.  Cardioverted 2 time(s).  Cardioverted at 150J and 200J biphasic.  Impression: Findings: Post procedure EKG shows: Afib with rate-dependent LBBB Complications: None Patient did tolerate procedure well.  Plan: 1. Unsuccessful DCCV after 2 stacked shocks - noted to have no sinus beats even after a 3 second post-DCCV pause. Rate-dependent LBBB noted.  2. Will likely need further EP evaluation as he is currently on amiodarone - noted to have severe LAE.  Time Spent Directly with the Patient:  30 minutes   Joshua Casino, MD, [facility], [place] Director of the Advanced Lipid Disorders &  Cardiovascular Risk Reduction Clinic Diplomate of the American Board of Clinical Lipidology Attending Cardiologist  Direct Dial: [phone]  Fax: [phone]  Website:  [email or web address] Joshua Moran 09/08/2020, 11:50 AM

## 2020-09-09 ENCOUNTER — Encounter (HOSPITAL_COMMUNITY): Payer: Self-pay | Admitting: Internal Medicine

## 2020-09-15 ENCOUNTER — Other Ambulatory Visit: Payer: Self-pay

## 2020-09-15 ENCOUNTER — Ambulatory Visit (HOSPITAL_COMMUNITY)
Admission: RE | Admit: 2020-09-15 | Discharge: 2020-09-15 | Disposition: A | Discharge status: Home / Self Care | Payer: PPO | Source: Ambulatory Visit | Attending: Nurse Practitioner | Admitting: Nurse Practitioner

## 2020-09-15 ENCOUNTER — Encounter (HOSPITAL_COMMUNITY): Payer: Self-pay | Admitting: Nurse Practitioner

## 2020-09-15 VITALS — BP 110/74 | HR 79 | Ht 72.0 in | Wt 297.0 lb

## 2020-09-15 DIAGNOSIS — R42 Dizziness and giddiness: Secondary | ICD-10-CM | POA: Insufficient documentation

## 2020-09-15 DIAGNOSIS — D6869 Other thrombophilia: Secondary | ICD-10-CM

## 2020-09-15 DIAGNOSIS — I420 Dilated cardiomyopathy: Secondary | ICD-10-CM | POA: Diagnosis not present

## 2020-09-15 DIAGNOSIS — I472 Ventricular tachycardia: Secondary | ICD-10-CM | POA: Diagnosis not present

## 2020-09-15 DIAGNOSIS — G4733 Obstructive sleep apnea (adult) (pediatric): Secondary | ICD-10-CM | POA: Insufficient documentation

## 2020-09-15 DIAGNOSIS — I4811 Longstanding persistent atrial fibrillation: Secondary | ICD-10-CM | POA: Diagnosis not present

## 2020-09-15 DIAGNOSIS — I951 Orthostatic hypotension: Secondary | ICD-10-CM

## 2020-09-15 DIAGNOSIS — Z79899 Other long term (current) drug therapy: Secondary | ICD-10-CM | POA: Diagnosis not present

## 2020-09-15 DIAGNOSIS — Z7901 Long term (current) use of anticoagulants: Secondary | ICD-10-CM | POA: Diagnosis not present

## 2020-09-15 MED ORDER — AMIODARONE HCL 200 MG PO TABS: 200.0000 mg | ORAL_TABLET | Freq: Every day | ORAL | Status: DC

## 2020-09-15 NOTE — Progress Notes (Signed)
Primary Care Physician: Lawerance Cruel, MD Referring Physician: Dr. Radford Pax EP: Dr. Tonie Griffith. is a 75 y.o. male with a h/o persistent afib, v tach , dilated cardiomyopathy with an EF of 39%,  seen by Dr. Quentin Ore in fall of 2021 for persistent afib x 2 years. He was felt not to be an ablation candidate and he felt amiodarone was his antiarrythmic of choice. He was loaded on amiodarone and had an successful cardioversion. Dr. Quentin Ore did note that he had a severely dilated  left atrium and it may be difficult for him to maintain SR. When the pt saw Dr. Radford Pax recently he was back in rate controlled afib and was referred here. Ekg now shows afib at 84 bpm. He does have some chronic issues with fatigue, lightheadedness and exertional dyspnea. He feels fatigue was better right after CV last fall. He is sedentary. He also has chronic dizziness which the pt reports has gotten worse over the last 6 months.   On last visit, I increased his amiodarone to 200 mg bid and set up cardioversion. He did not shock out. Again, Dr. Quentin Ore had concerns re maintaining  SR long term. He again mentions dizziness worse first thing in the am. Better in the pm. He takes both antihypertensives at hs. Will try moving losartan to am. He reports decreased physical tolerance. Feels ok with sitting, symptomatic with moving around.   Today, he denies symptoms of palpitations, chest pain, shortness of breath, orthopnea, PND, lower extremity edema, dizziness, presyncope, syncope, or neurologic sequela. The patient is tolerating medications without difficulties and is otherwise without complaint today.   Past Medical History:  Diagnosis Date  . Abnormal screening CT of chest    coronary calcium in left main and RCA  . Anemia   . Arthritis   . CHF (congestive heart failure) ([place])   . Complication of anesthesia    Pt reports difficult urinating  . Coronary artery calcification seen on CAT scan    no  ischemic on nuclear stress test 09/2019  . 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  Cardiac MRI 2021 with EF 39% and moderate RV dysfunction and non-compaction  . Dyspnea    with some activity  . GERD (gastroesophageal reflux disease)    occasional  . Glaucoma   . Hyperlipidemia    statin intolerant at doses > Crestor 5mg  3 times weekly  . OSA on CPAP   . PAF (paroxysmal atrial fibrillation) ([facility])    CHADS2VASC score 2 on apixaban   Past Surgical History:  Procedure Laterality Date  . CARDIOVERSION N/A 04/01/2020   Procedure: CARDIOVERSION;  Surgeon: Skeet Latch, MD;  Location: [place];  Service: Cardiovascular;  Laterality: N/A;  . CARDIOVERSION N/A 09/08/2020   Procedure: CARDIOVERSION;  Surgeon: Pixie Casino, MD;  [facility] ENDOSCOPY;  Service: Cardiovascular;  Laterality: N/A;  . EYE SURGERY     bilateral cataracts  . KNEE ARTHROSCOPY W/ DEBRIDEMENT     left   . RETINAL DETACHMENT SURGERY Bilateral   . RIGHT/LEFT HEART CATH AND CORONARY ANGIOGRAPHY N/A 05/06/2020   Procedure: RIGHT/LEFT HEART CATH AND CORONARY ANGIOGRAPHY;  Surgeon: Larey Dresser, MD;  Location: [place] CV LAB;  Service: Cardiovascular;  Laterality: N/A;  . TOTAL KNEE ARTHROPLASTY Left 01/01/2018   Procedure: LEFT TOTAL KNEE ARTHROPLASTY;  Surgeon: Gaynelle Arabian, MD;  Location: [facility] ORS;  Service: Orthopedics;  Laterality: Left;  50 mins  .  TOTAL KNEE ARTHROPLASTY Right 06/25/2018   Procedure: TOTAL KNEE ARTHROPLASTY;  Surgeon: Gaynelle Arabian, MD;  Location: [facility] ORS;  Service: Orthopedics;  Laterality: Right;  40min  . VASECTOMY      Current Outpatient Medications  Medication Sig Dispense Refill  . acetaminophen (TYLENOL) 500 MG tablet Take 500 mg by mouth every 6 (six) hours as needed (PAIN).    Marland Kitchen apixaban (ELIQUIS) 5 MG TABS tablet Take 1 tablet (5 mg total) by mouth 2 (two) times daily. 60 tablet 0  . brimonidine (ALPHAGAN) 0.15 % ophthalmic solution Place  1 drop into the right eye at bedtime.    . cetirizine (ZYRTEC) 10 MG tablet Take 10 mg by mouth every evening.    . Cholecalciferol (VITAMIN D3) 50 MCG (2000 UT) TABS Take 2,000 Units by mouth at bedtime.     . ferrous sulfate 325 (65 FE) MG tablet Take 325 mg by mouth in the morning.    . finasteride (PROSCAR) 5 MG tablet Take 5 mg by mouth every Monday, Wednesday, and Friday. IN THE MORNING    . fluticasone (FLONASE) 50 MCG/ACT nasal spray Place 2 sprays into both nostrils 2 (two) times daily.     . Glucosamine Sulfate 750 MG TABS Take 750 mg by mouth in the morning and at bedtime.    Marland Kitchen losartan (COZAAR) 25 MG tablet TAKE 1/2 TABLET BY MOUTH ONCE DAILY 45 tablet 3  . Melatonin 10 MG TABS Take 10 mg by mouth at bedtime.     . Multiple Vitamin (MULTIVITAMIN WITH MINERALS) TABS tablet Take 1 tablet by mouth in the morning.    Marland Kitchen REPATHA SURECLICK 196 MG/ML SOAJ INJECT ONE pen into THE SKIN every 14 DAYS 6 mL 3  . rosuvastatin (CRESTOR) 5 MG tablet Take 5 mg by mouth every Monday, Wednesday, and Friday. IN THE EVENING    . sildenafil (REVATIO) 20 MG tablet Take 40-100 mg by mouth daily as needed (erectile dysfunction.).   5  . spironolactone (ALDACTONE) 25 MG tablet Take 1 tablet (25 mg total) by mouth at bedtime. 90 tablet 3  . tamsulosin (FLOMAX) 0.4 MG CAPS capsule Take 0.4 mg by mouth at bedtime.     Marland Kitchen amiodarone (PACERONE) 200 MG tablet Take 1 tablet (200 mg total) by mouth daily.     No current facility-administered medications for this encounter.    Allergies  Allergen Reactions  . Xarelto [Rivaroxaban] Rash    Patient denies allergy as of 08/07/19  . Empagliflozin Other (See Comments)    (JARDIANCE) dizziness  . Penicillins Other (See Comments)    Very flushed  Has patient had a PCN reaction causing immediate rash, facial/tongue/throat swelling, SOB or lightheadedness with hypotension: No Has patient had a PCN reaction causing severe rash involving mucus membranes or skin necrosis:  No Has patient had a PCN reaction that required hospitalization No Has patient had a PCN reaction occurring within the last 10 years: No If all of the above answers are "NO", then may proceed with Cephalosporin use.   . Statins Other (See Comments)    Pt c/o muscle aches with use of statins.  . Vancomycin Rash    Social History   Socioeconomic History  . Marital status: Married    Spouse name: Not on file  . Number of children: Not on file  . Years of education: Not on file  . Highest education level: Not on file  Occupational History  . Not on file  Tobacco Use  .  Smoking status: Never Smoker  . Smokeless tobacco: Never Used  Vaping Use  . Vaping Use: Never used  Substance and Sexual Activity  . Alcohol use: Yes    Alcohol/week: 1.0 standard drink    Types: 1 Cans of beer per week    Comment: Rare  . Drug use: No  . Sexual activity: Not Currently  Other Topics Concern  . Not on file  Social History Narrative  . Not on file   Social Determinants of Health   Financial Resource Strain: Not on file  Food Insecurity: Not on file  Transportation Needs: Not on file  Physical Activity: Not on file  Stress: Not on file  Social Connections: Not on file  Intimate Partner Violence: Not on file    Family History  Problem Relation Age of Onset  . Lung cancer Mother        smoker    ROS- All systems are reviewed and negative except as per the HPI above  Physical Exam: Vitals:   09/15/20 0936  BP: 110/74  Pulse: 79  Weight: 134.7 kg  Height: 6' (1.829 m)   Wt Readings from Last 3 Encounters:  09/15/20 134.7 kg  09/08/20 134.9 kg  08/28/20 134.9 kg    Labs: Lab Results  Component Value Date   NA 139 09/08/2020   K 7.7 (HH) 09/08/2020   CL 108 09/08/2020   CO2 27 09/08/2020   GLUCOSE 91 09/08/2020   BUN 28 (H) 09/08/2020   CREATININE 1.10 09/08/2020   CALCIUM 8.9 09/08/2020   PHOS 4.0 08/07/2019   MG 2.0 08/07/2019   Lab Results  Component Value  Date   INR 1.0 08/06/2019   Lab Results  Component Value Date   CHOL 134 05/20/2020   HDL 45 05/20/2020   LDLCALC 66 05/20/2020   TRIG 117 05/20/2020     GEN- The patient is well appearing, alert and oriented x 3 today.   Head- normocephalic, atraumatic Eyes-  Sclera clear, conjunctiva pink Ears- hearing intact Oropharynx- clear Neck- supple, no JVP Lymph- no cervical lymphadenopathy Lungs- Clear to ausculation bilaterally, normal work of breathing Heart- irregular rate and rhythm, no murmurs, rubs or gallops, PMI not laterally displaced GI- soft, NT, ND, + BS Extremities- no clubbing, cyanosis, or edema MS- no significant deformity or atrophy Skin- no rash or lesion Psych- euthymic mood, full affect Neuro- strength and sensation are intact  EKG- afib at 79  Bpm, LBBB,  qrs int 168 ms, qtc 516  ms  Epic records reviewed      Assessment and Plan: 1. Long standing persistent afib  H/o of persistent afib x 2 years prior to being evaluated by Dr. Quentin Ore last fall Enjoyed SR with amiodarone loading and cardioversion in October until recent  Per Dr. Quentin Ore may be difficult to keep in SR for severly dilated left atrium, not an ablation candidate    I increased amiodarone to 200 mg bid for CV, reduce back to one tablet daily now  Unsuccessful cardioversion 09/08/20 Remains in rate controlled afib  Decrease amio back to 200 mg daily and I will move up appointment with Dr. Quentin Ore to further discuss if amiodarone  will be stopped due to inability to keep pt in rhythm  He does not tolerate BB's   2. CHA2DS2VASc score of at least 6 Continue  eliquis 5 mg bid   3. OSA Continue CPAP  4. Dizziness Pt reports worse in the am  Takes both losartan and  spironolactone in the pm Try moving losartan to after breakfast   I will move up appointment with Dr. Quentin Ore from  June to  make further decisions re amiodarone and pt requested appointment with Dr. Aundra Dubin  to be moved up form  late May.   Geroge Baseman Janeece Blok, [facility] [address] [place], [place] [address] [phone]

## 2020-09-15 NOTE — Patient Instructions (Signed)
Decrease Amiodarone to 200mg  once a day  Move losartan to the morning time  Will have Dr. Quentin Ore & Dr. Aundra Dubin appts moved up.

## 2020-09-25 ENCOUNTER — Encounter: Payer: Self-pay | Admitting: Cardiology

## 2020-09-25 ENCOUNTER — Telehealth (HOSPITAL_COMMUNITY): Payer: Self-pay | Admitting: *Deleted

## 2020-09-25 ENCOUNTER — Ambulatory Visit (INDEPENDENT_AMBULATORY_CARE_PROVIDER_SITE_OTHER): Payer: PPO

## 2020-09-25 ENCOUNTER — Other Ambulatory Visit: Payer: Self-pay

## 2020-09-25 ENCOUNTER — Encounter: Payer: Self-pay | Admitting: Radiology

## 2020-09-25 ENCOUNTER — Ambulatory Visit: Payer: PPO | Admitting: Cardiology

## 2020-09-25 VITALS — BP 102/50 | HR 64 | Ht 72.0 in | Wt 296.0 lb

## 2020-09-25 DIAGNOSIS — I480 Paroxysmal atrial fibrillation: Secondary | ICD-10-CM | POA: Diagnosis not present

## 2020-09-25 DIAGNOSIS — I2510 Atherosclerotic heart disease of native coronary artery without angina pectoris: Secondary | ICD-10-CM | POA: Diagnosis not present

## 2020-09-25 DIAGNOSIS — I951 Orthostatic hypotension: Secondary | ICD-10-CM | POA: Diagnosis not present

## 2020-09-25 DIAGNOSIS — G4733 Obstructive sleep apnea (adult) (pediatric): Secondary | ICD-10-CM | POA: Diagnosis not present

## 2020-09-25 DIAGNOSIS — R072 Precordial pain: Secondary | ICD-10-CM

## 2020-09-25 DIAGNOSIS — I428 Other cardiomyopathies: Secondary | ICD-10-CM | POA: Diagnosis not present

## 2020-09-25 DIAGNOSIS — E785 Hyperlipidemia, unspecified: Secondary | ICD-10-CM

## 2020-09-25 NOTE — Telephone Encounter (Signed)
Patient given detailed instructions per Myocardial Perfusion Study Information Sheet for the test on 09/29/20 at 10:45. Patient notified to arrive 15 minutes early and that it is imperative to arrive on time for appointment to keep from having the test rescheduled.  If you need to cancel or reschedule your appointment, please call the office within 24 hours of your appointment. . Patient verbalized understanding.Joshua Moran

## 2020-09-25 NOTE — Patient Instructions (Signed)
Medication Instructions:  Your physician recommends that you continue on your current medications as directed. Please refer to the Current Medication list given to you today.  *If you need a refill on your cardiac medications before your next appointment, please call your pharmacy*  Testing/Procedures: Your physician has recommended that you wear an event monitor. Event monitors are medical devices that record the heart's electrical activity. Doctors most often [place] these monitors to diagnose arrhythmias. Arrhythmias are problems with the speed or rhythm of the heartbeat. The monitor is a small, portable device. You can wear one while you do your normal daily activities. This is usually used to diagnose what is causing palpitations/syncope (passing out).  Your physician has requested that you have an echocardiogram. Echocardiography is a painless test that uses sound waves to create images of your heart. It provides your doctor with information about the size and shape of your heart and how well your heart's chambers and valves are working. This procedure takes approximately one hour. There are no restrictions for this procedure.  Your physician has requested that you have a lexiscan myoview. For further information please visit [email or web address]. Please follow instruction sheet, as given.   Follow-Up: At [facility], you and your health needs are our priority.  As part of our continuing mission to provide you with exceptional heart care, we have created designated Provider Care Teams.  These Care Teams include your primary Cardiologist (physician) and Advanced Practice Providers (APPs -  Physician Assistants and Nurse Practitioners) who all work together to provide you with the care you need, when you need it.  Your next appointment:   1 year(s)  The format for your next appointment:   In Person  Provider:   You may see Joshua Him, MD or one of the following Advanced Practice Providers  on your designated Care Team:    Joshua Copa, PA-C  Joshua Barrios, PA-C  Other Instructions Joshua Moran- Long Term Monitor Instructions   Your physician has requested you wear a ZIO patch monitor for _3_ days.  This is a single patch monitor.   [employer] supplies one patch monitor per enrollment. Additional stickers are not available. Please do not apply patch if you will be having a Nuclear Stress Test, Echocardiogram, Cardiac CT, MRI, or Chest Xray during the period you would be wearing the monitor. The patch cannot be worn during these tests. You cannot remove and re-apply the ZIO XT patch monitor.  Your ZIO patch monitor will be sent Fed Ex from [employer] directly to your home address. It may take 3-5 days to receive your monitor after you have been enrolled.  Once you have received your monitor, please review the enclosed instructions. Your monitor has already been registered assigning a specific monitor serial # to you.  Billing and Patient Assistance Program Information   We have supplied [employer] with any of your insurance information on file for billing purposes. [employer] offers a sliding scale Patient Assistance Program for patients that do not have insurance, or whose insurance does not completely cover the cost of the ZIO monitor.   You must apply for the Patient Assistance Program to qualify for this discounted rate.     To apply, please call [employer] at [phone], select option 4, then select option 2, and ask to apply for Patient Assistance Program.  Joshua Moran will ask your household income, and how many people are in your household.  They will quote your out-of-pocket cost based on that information.  [employer]  will also be able to set up a 37-month, interest-free payment plan if needed.  Applying the monitor   Shave hair from upper left chest.  Hold abrader disc by orange tab. Rub abrader in 40 strokes over the upper left chest as indicated in your monitor instructions.  Clean  area with 4 enclosed alcohol pads. Let dry.  Apply patch as indicated in monitor instructions. Patch will be placed under collarbone on left side of chest with arrow pointing upward.  Rub patch adhesive wings for 2 minutes. Remove white label marked "1". Remove the white label marked "2". Rub patch adhesive wings for 2 additional minutes.  While looking in a mirror, press and release button in center of patch. A small green light will flash 3-4 times. This will be your only indicator that the monitor has been turned on. ?  Do not shower for the first 24 hours. You may shower after the first 24 hours.  Press the button if you feel a symptom. You will hear a small click. Record Date, Time and Symptom in the Patient Logbook.  When you are ready to remove the patch, follow instructions on the last 2 pages of the Patient Logbook. Stick patch monitor onto the last page of Patient Logbook.  Place Patient Logbook in the blue and white box.  Use locking tab on box and tape box closed securely.  The blue and white box has prepaid postage on it. Please place it in the mailbox as soon as possible. Your physician should have your test results approximately 7 days after the monitor has been mailed back to [facility].  Call Joshua Moran at [phone] if you have questions regarding your ZIO XT patch monitor. Call them immediately if you see an orange light blinking on your monitor.  If your monitor falls off in less than 4 days, contact our Monitor department at [phone]. ?If your monitor becomes loose or falls off after 4 days call [employer] at [phone] for suggestions on securing your monitor.?

## 2020-09-25 NOTE — Progress Notes (Signed)
Enrolled patient for a 3 day Zio XT Monitor to be mailed to patients home.  °

## 2020-09-25 NOTE — Progress Notes (Signed)
Cardiology Office Note:    Date:  09/25/2020   ID:  Joshua Dunker., DOB 01-05-1946, MRN [id number]  PCP:  Lawerance Cruel, MD  Cardiologist:  [name], MD    Referring MD: Lawerance Cruel, MD   Chief Complaint  Patient presents with  . Cardiomyopathy  . Hypertension  . Atrial Fibrillation  . Sleep Apnea    History of Present Illness:    Joshua Coxwell. is a 75 y.o. male with a hx of coronary calcifications of the LM and RCA, PAF, HLD, GERD and retired Teacher, English as a foreign language for [employer].  He has never smoked but has been followed by Dr. Gwenlyn Found in the past for [place] felt related to poor exercise tolerance from inactivity.  Stress myoview in 2016 was normal and 2D echo in 2019 showed normal LVF with G1DD and mild pulmonary HTN.  He saw Kerin Ransom in 07/2018 and was noted to be in afib by EKG but not started on DOAC as CHADS2VASC score he felt was 1.  Then saw Dr. Gwenlyn Found in 12/2018 for preop clearance for knee surgery and was still in afib and Dr. Gwenlyn Found stated that  His CHADS2VASC score was 1 but will increase to 2 in May 2021 when he turned 74yo. 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  He has recovered fully from his CVA.   He had a Chest CT done in 2016 showing 3v ASCAD. He has a hx of HLD and has been on Crestor 5mg  3 times weekly but has been intolerant to any higher dose.    In followup he was remaining in atrial fibrillation.  It was felt that he should recover from CVA first and then consider DCCV.  Due to coronary artery calcifications on Chest CT, a Lexi myoview was done which showed Fixed defect at the apex/apical lateral/apical inferior walls, without evidence of ischemia. Similar to prior study. EF with global hypokinesis. No evidence of ischemia.  He also has some orthostatic hypotension and wears compression hose occasionally but has not been wearing the abdominal binder.   A sleep study was ordered which showed moderate OSA with an AHI of 26/hr and  was started on auto CPAP from 4-20cm H2O.  He underwent DCCV to NSR in 03/2020.  He is followed in AHF clinic by Dr. Aundra Dubin and has NYHA class 2 symptoms.   He recently went back into atrial fibrillation and underwent DCCV but was unsuccessful at restoring NSR.  He was seen back by Roderic Palau, NP in afib clinic and per Dr. Quentin Ore may be difficult to keep in SR for severly dilated left atrium, not an ablation candidate.  He has an appt soon with Dr. Quentin Ore to discuss his options going forward as he feels SOB and fatigued when in atrial fibrillation.   He is here today for followup and is doing well.  He tells me that he has been having some mild pressure in  His chest midsternal with no radiation.  There is no associated sx of nausea or diaphoresis.  Has chronic DOE that is worse when he went back into atrial fibrillation.  He denies any PND, orthopnea, LE edema, dizziness, palpitations or syncope. He is compliant with his meds and is tolerating meds with no SE.    He is doing well with his CPAP device and thinks that he has gotten used to it.  He tolerates the mask and feels the pressure is adequate.  He feels tired when he gets up in the am and occasionally has some daytime sleepiness.  He goes to bed at 10:30pm and get up at 8-9am.  He wakes up frequently at night.  He denies any significant mouth or nasal dryness or nasal congestion.  He does not think that he snores.     Past Medical History:  Diagnosis Date  . Abnormal screening CT of chest    coronary calcium in left main and RCA  . Anemia   . Arthritis   . CHF (congestive heart failure) ([place])   . Complication of anesthesia    Pt reports difficult urinating  . Coronary artery calcification seen on CAT scan    no ischemic on nuclear stress test 09/2019  . DCM (dilated cardiomyopathy) ([place])    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  Cardiac MRI 2021 with EF 39% and moderate RV dysfunction and non-compaction  . Dyspnea    with some activity   . GERD (gastroesophageal reflux disease)    occasional  . Glaucoma   . Hyperlipidemia    statin intolerant at doses > Crestor 5mg  3 times weekly  . OSA on CPAP   . PAF (paroxysmal atrial fibrillation) (H[facility])    CHADS2VASC score 2 on apixaban    Past Surgical History:  Procedure Laterality Date  . CARDIOVERSION N/A 04/01/2020   Procedure: CARDIOVERSION;  Surgeon: Skeet Latch, MD;  Location: [facility];  Service: Cardiovascular;  Laterality: N/A;  . CARDIOVERSION N/A 09/08/2020   Procedure: CARDIOVERSION;  Surgeon: Pixie Casino, MD;  Location[facility] ENDOSCOPY;  Service: Cardiovascular;  Laterality: N/A;  . EYE SURGERY     bilateral cataracts  . KNEE ARTHROSCOPY W/ DEBRIDEMENT     left   . RETINAL DETACHMENT SURGERY Bilateral   . RIGHT/LEFT HEART CATH AND CORONARY ANGIOGRAPHY N/A 05/06/2020   Procedure: RIGHT/LEFT HEART CATH AND CORONARY ANGIOGRAPHY;  Surgeon: Larey Dresser, MD;  Location: [facility];  Service: Cardiovascular;  Laterality: N/A;  . TOTAL KNEE ARTHROPLASTY Left 01/01/2018   Procedure: LEFT TOTAL KNEE ARTHROPLASTY;  Surgeon: Gaynelle Arabian, MD;  Location: [facility] ORS;  Service: Orthopedics;  Laterality: Left;  50 mins  . TOTAL KNEE ARTHROPLASTY Right 06/25/2018   Procedure: TOTAL KNEE ARTHROPLASTY;  Surgeon: Gaynelle Arabian, MD;  Location: [facility];  Service: Orthopedics;  Laterality: Right;  72min  . VASECTOMY      Current Medications: Current Meds  Medication Sig  . acetaminophen (TYLENOL) 500 MG tablet Take 500 mg by mouth every 6 (six) hours as needed (PAIN).  Marland Kitchen amiodarone (PACERONE) 200 MG tablet Take 1 tablet (200 mg total) by mouth daily.  Marland Kitchen apixaban (ELIQUIS) 5 MG TABS tablet Take 1 tablet (5 mg total) by mouth 2 (two) times daily.  . brimonidine (ALPHAGAN) 0.15 % ophthalmic solution Place 1 drop into the right eye at bedtime.  . cetirizine (ZYRTEC) 10 MG tablet Take 10 mg by mouth every evening.  . Cholecalciferol (VITAMIN D3) 50 MCG (2000 UT) TABS  Take 2,000 Units by mouth at bedtime.   . ferrous sulfate 325 (65 FE) MG tablet Take 325 mg by mouth in the morning.  . finasteride (PROSCAR) 5 MG tablet Take 5 mg by mouth every Monday, Wednesday, and Friday. IN THE MORNING  . fluticasone (FLONASE) 50 MCG/ACT nasal spray Place 2 sprays into both nostrils 2 (two) times daily.   . Glucosamine Sulfate 750 MG TABS Take 750 mg by mouth in the morning and at bedtime.  Marland Kitchen losartan (  COZAAR) 25 MG tablet TAKE 1/2 TABLET BY MOUTH ONCE DAILY  . Melatonin 10 MG TABS Take 10 mg by mouth at bedtime.   . Multiple Vitamin (MULTIVITAMIN WITH MINERALS) TABS tablet Take 1 tablet by mouth in the morning.  Marland Kitchen REPATHA SURECLICK [id number] MG/ML SOAJ INJECT ONE pen into THE SKIN every 14 DAYS  . rosuvastatin (CRESTOR) 5 MG tablet Take 5 mg by mouth every Monday, Wednesday, and Friday. IN THE EVENING  . sildenafil (REVATIO) 20 MG tablet Take 40-100 mg by mouth daily as needed (erectile dysfunction.).   Marland Kitchen spironolactone (ALDACTONE) 25 MG tablet Take 1 tablet (25 mg total) by mouth at bedtime.  . tamsulosin (FLOMAX) 0.4 MG CAPS capsule Take 0.4 mg by mouth at bedtime.      Allergies:   Xarelto [rivaroxaban], Empagliflozin, Penicillins, Statins, and Vancomycin   Social History   Socioeconomic History  . Marital status: Married    Spouse name: Not on file  . Number of children: Not on file  . Years of education: Not on file  . Highest education level: Not on file  Occupational History  . Not on file  Tobacco Use  . Smoking status: Never Smoker  . Smokeless tobacco: Never Used  Vaping Use  . Vaping Use: Never used  Substance and Sexual Activity  . Alcohol use: Yes    Alcohol/week: 1.0 standard drink    Types: 1 Cans of beer per week    Comment: Rare  . Drug use: No  . Sexual activity: Not Currently  Other Topics Concern  . Not on file  Social History Narrative  . Not on file   Social Determinants of Health   Financial Resource Strain: Not on file  Food  Insecurity: Not on file  Transportation Needs: Not on file  Physical Activity: Not on file  Stress: Not on file  Social Connections: Not on file     Family History: The patient's family history includes Lung cancer in his mother.  ROS:   Please see the history of present illness.    ROS  All other systems reviewed and negative.   EKGs/Labs/Other Studies Reviewed:    The following studies were reviewed today: Sleep study, labs  EKG:  EKG is not ordered today  Recent Labs: 09/08/2020: ALT 18; BUN 28; Creatinine, Ser 1.10; Hemoglobin 14.6; Platelets 224; Potassium 7.7; Sodium 139; TSH 3.638   Recent Lipid Panel    Component Value Date/Time   CHOL 134 05/20/2020 1632   CHOL 127 11/25/2019 1401   TRIG 117 05/20/2020 1632   HDL 45 05/20/2020 1632   HDL 49 11/25/2019 1401   CHOLHDL 3.0 05/20/2020 1632   VLDL 23 05/20/2020 1632   LDLCALC 66 05/20/2020 1632   LDLCALC 60 11/25/2019 1401   LDLDIRECT 62 11/25/2019 1401    Physical Exam:    VS:  BP (!) 102/50   Pulse 64   Ht 6' (1.829 m)   Wt 296 lb (134.3 kg)   SpO2 96%   BMI 40.14 kg/m     Wt Readings from Last 3 Encounters:  09/25/20 296 lb (134.3 kg)  09/15/20 297 lb (134.7 kg)  09/08/20 297 lb 6.4 oz (134.9 kg)     GEN: Well nourished, well developed in no acute distress HEENT: Normal NECK: No JVD; No carotid bruits LYMPHATICS: No lymphadenopathy CARDIAC:irregularly irregular, no murmurs, rubs, gallops RESPIRATORY:  Clear to auscultation without rales, wheezing or rhonchi  ABDOMEN: Soft, non-tender, non-distended MUSCULOSKELETAL:  No edema; No deformity  SKIN: Warm and dry NEUROLOGIC:  Alert and oriented x 3 PSYCHIATRIC:  Normal affect    ASSESSMENT:    1. Paroxysmal atrial fibrillation (HCC)   2. Dyslipidemia, goal LDL below 100   3. Coronary artery calcification seen on CAT scan   4. OSA (obstructive sleep apnea)   5. 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 Orthostatic hypotension    PLAN:     In order of problems listed above:  1.  Persistent atrial fibrillation -S/P DCCV 03/2020 -reverted to atrial fibrillation and underwent unsuccessful DCCV earlier this month -seen back by afib clinic and felt not many options due to severe LA size and not a candidate for ablation -he has an appt with Dr. Quentin Ore coming up -continue apixaban 5mg  BID (CHADS2VASC score is 3 - age >29 in a month and CAD on Chest CT) -continue amiodarone 200mg  daily -He denies any bleeding problems on DOAC -SCr stable at 1.1 and Hbg 14.6 on 09/08/2020  2.  HLD -LDL goal < 70 due to coronary calcifications -last LDL was 66 in Dec 2021 -continue Crestor 5mg  3 times weekly and Repatha  3.  Coronary artery calcifications -Chest CT in 2016 showed 3 vessel coronary artery calcifications -EF 45-50% on echo -cannot get coronary CTA score due to afib -repeat nuclear stress test with no ischemia 09/2019 -no ASA due to [place] -continue statin -nuclear stress test in April 2021 showed a Fixed defect at the apex/apical lateral/apical inferior walls, without evidence of ischemia. Similar to prior study. EF with global hypokinis. No evidence of ischemia. -he has chronic nonexertional SOB and  he feels is related to his afib as it is much improved when he is in NSR -his chest pain is new and sporadic>>unclear etiology -I will repeat a nuclear stress test to rule out ischemia -Shared Decision Making/Informed Consent{ The risks [chest pain, shortness of breath, cardiac arrhythmias, dizziness, blood pressure fluctuations, myocardial infarction, stroke/transient ischemic attack, nausea, vomiting, allergic reaction, radiation exposure, metallic taste sensation and life-threatening complications (estimated to be 1 in 10,000)], benefits (risk stratification, diagnosing coronary artery disease, treatment guidance) and alternatives of a nuclear stress test were discussed in detail with Mr. Seiger and he agrees to proceed. -at this  time continue medical therapy with statin  4.  OSA -  The patient is tolerating PAP therapy well without any problems. The PAP download was reviewed today and showed an AHI of 1.7/hr on auto PAP from 4 to 20 cm H2O with 93% compliance in using more than 4 hours nightly.  The patient has been using and benefiting from PAP use and will continue to benefit from therapy.   5.  DCM with Non Compaction -cMRI c/w non-ischemic CM with non-compaction -noted to have coronary calcifications on Chest CT but no ischemia on stress test -cardiac MRI with EF 39% and mild LVH with diffuse HK and no LGE -2D echo with EF 45-50% on echo 08/2019 -repeat echo to make sure LVF is stable and get a 3 day ziopatch to rule out ventricular arrhythmias -continue low dose ARB>>cannot titrate further due to soft BP  6.  Orthostatic hypotension -he has not been compliant with using the Abdominal binder -I have encouraged him to use his compression hose and abd binder daily when out standing for prolonged periods of time -stop spiro due to low BP   Medication Adjustments/Labs and Tests Ordered: Current medicines are reviewed at length with the patient today.  Concerns regarding medicines are outlined above.  No  orders of the defined types were placed in this encounter.  No orders of the defined types were placed in this encounter.   Signed, [name], MD  09/25/2020 8:57 AM    [place]

## 2020-09-25 NOTE — Addendum Note (Signed)
Addended by: Antonieta Iba on: 09/25/2020 09:06 AM   Modules accepted: Orders

## 2020-09-25 NOTE — Addendum Note (Signed)
Addended by: [name] R on: 09/25/2020 10:29 AM   Modules accepted: Orders

## 2020-09-26 DIAGNOSIS — G4733 Obstructive sleep apnea (adult) (pediatric): Secondary | ICD-10-CM | POA: Diagnosis not present

## 2020-09-29 ENCOUNTER — Ambulatory Visit: Payer: PPO | Admitting: Cardiology

## 2020-09-29 ENCOUNTER — Ambulatory Visit (HOSPITAL_COMMUNITY): Payer: PPO | Attending: Cardiology

## 2020-09-29 ENCOUNTER — Encounter: Payer: Self-pay | Admitting: Cardiology

## 2020-09-29 ENCOUNTER — Other Ambulatory Visit: Payer: Self-pay

## 2020-09-29 VITALS — BP 112/74 | HR 73 | Ht 72.0 in | Wt 297.0 lb

## 2020-09-29 DIAGNOSIS — I4819 Other persistent atrial fibrillation: Secondary | ICD-10-CM

## 2020-09-29 DIAGNOSIS — R072 Precordial pain: Secondary | ICD-10-CM | POA: Diagnosis not present

## 2020-09-29 DIAGNOSIS — I420 Dilated cardiomyopathy: Secondary | ICD-10-CM

## 2020-09-29 DIAGNOSIS — Z86010 Personal history of colonic polyps: Secondary | ICD-10-CM | POA: Diagnosis not present

## 2020-09-29 DIAGNOSIS — I5042 Chronic combined systolic (congestive) and diastolic (congestive) heart failure: Secondary | ICD-10-CM

## 2020-09-29 DIAGNOSIS — Z7901 Long term (current) use of anticoagulants: Secondary | ICD-10-CM | POA: Diagnosis not present

## 2020-09-29 MED ORDER — REGADENOSON 0.4 MG/5ML IV SOLN
0.4000 mg | Freq: Once | INTRAVENOUS | Status: AC
  Administered 2020-09-29: 0.4 mg via INTRAVENOUS

## 2020-09-29 MED ORDER — TECHNETIUM TC 99M TETROFOSMIN IV KIT
32.8000 | PACK | Freq: Once | INTRAVENOUS | Status: AC | PRN
  Administered 2020-09-29: 32.8 via INTRAVENOUS
  Filled 2020-09-29: qty 33

## 2020-09-29 NOTE — Patient Instructions (Addendum)
Medication Instructions:  Your physician has recommended you make the following change in your medication:   1.  STOP taking amiodarone   *If you need a refill on your cardiac medications before your next appointment, please call your pharmacy*  Lab Work: None ordered. If you have labs (blood work) drawn today and your tests are completely normal, you will receive your results only by: Marland Kitchen MyChart Message (if you have MyChart) OR . A paper copy in the mail If you have any lab test that is abnormal or we need to change your treatment, we will call you to review the results.  Testing/Procedures:  Your physician has recommended that you have an ablation. Catheter ablation is a medical procedure used to treat some cardiac arrhythmias (irregular heartbeats). During catheter ablation, a long, thin, flexible tube is put into a blood vessel in your groin (upper thigh), or neck. This tube is called an ablation catheter. It is then guided to your heart through the blood vessel. Radio frequency waves destroy small areas of heart tissue where abnormal heartbeats may cause an arrhythmia to start. Please see the instruction sheet given to you today.   Your physician has recommended that you have a pacemaker inserted. A pacemaker is a small device that is placed under the skin of your chest or abdomen to help control abnormal heart rhythms. This device uses electrical pulses to prompt the heart to beat at a normal rate. Pacemakers are used to treat heart rhythms that are too slow. Wire (leads) are attached to the pacemaker that goes into the chambers of you heart. This is done in the hospital and usually requires and overnight stay. Please see the instruction sheet given to you today for more information.   Follow-Up:  If you decide to proceed with AV node ablation and biventricular pacemaker implant please contact me.  Sonia Baller RN  Via MyChart or call the office at [phone]    Biventricular  Pacemaker Implantation A biventricular pacemaker implantation is a procedure to place (implant) a pacemaker near the heart. A pacemaker is a small, battery-powered device that helps control the heartbeat. If the heart beats irregularly or too slowly (bradycardia), the pacemaker will pace the heart so that it beats at a normal rate or a programmed rate. The parts of a biventricular pacemaker include:  The pulse generator. The pulse generator contains a small computer and a memory system that is programmed to keep the heart beating at a certain rate. The pulse generator also produces the electrical signal that triggers the heart to beat. This is implanted under the skin of the upper chest, near the collarbone.  Wires (leads). There may be two or three leads placed in the heart--one to the right atrium, one to the right ventricle, and one through the coronary sinus to reach the left ventricle of the heart. The leads are connected to the pulse generator. They transmit electrical pulses from the pulse generator to the heart. A biventricular pacemaker is used in people with heart failure due to weak heart muscles. The pacemaker can help the heart chambers pump more efficiently. This procedure may be done to treat:  Symptoms of severe heart failure, such as shortness of breath (dyspnea).  Loss of consciousness that happens repeatedly (syncope) because of an irregular heart rate. Tell a health care provider about:  Any allergies you have.  All medicines you are taking, including vitamins, herbs, eye drops, creams, and over-the-counter medicines.  Any problems you or family members have  had with anesthetic medicines.  Any blood disorders you have.  Any surgeries you have had.  Any medical conditions you have.  Whether you are pregnant or may be pregnant. What are the risks? Generally, this is a safe procedure. However, problems may occur, including:  Infection.  Swelling, bruising, or bleeding  at the pacemaker site, especially if you take blood thinners.  Allergic reactions to medicines or dyes.  Damage to blood vessels or nerves near the pacemaker.  Failure of the pacemaker to improve your condition.  Collapsed lung.  Blood clots.  Lead failures. This may require more surgery. What happens before the procedure? Medicines Ask your health care provider about:  Changing or stopping your regular medicines. This is especially important if you are taking diabetes medicines or blood thinners.  Taking medicines such as aspirin and ibuprofen. These medicines can thin your blood. Do not take these medicines unless your health care provider tells you to take them.  Taking over-the-counter medicines, vitamins, herbs, and supplements. Eating and drinking Follow instructions from your health care provider about eating and drinking, which may include:  8 hours before the procedure - stop eating heavy meals or foods, such as meat, fried foods, or fatty foods.  6 hours before the procedure - stop eating light meals or foods, such as toast or cereal.  6 hours before the procedure - stop drinking milk or drinks that contain milk.  2 hours before the procedure - stop drinking clear liquids. Staying hydrated Follow instructions from your health care provider about hydration, which may include:  Up to 2 hours before the procedure - you may continue to drink clear liquids, such as water, clear fruit juice, black coffee, and plain tea.   General instructions  Do not use any products that contain nicotine or tobacco for at least 4 weeks before the procedure. These products include cigarettes, e-cigarettes, and chewing tobacco. If you need help quitting, ask your health care provider.  You may have tests, including: ? Blood tests. ? Chest X-rays. ? Echocardiogram. This is a test that uses sound waves (ultrasound) to produce an image of the heart.  Ask your health care provider: ? How  your surgery site will be marked. ? What steps will be taken to help prevent infection. These may include:  Removing hair at the surgery site.  Washing skin with a germ-killing soap.  Taking antibiotic medicine.  Plan to have someone take you home from the hospital or clinic.  If you will be going home right after the procedure, plan to have someone with you for 24 hours. What happens during the procedure?  An IV will be inserted into one of your veins.  You will be connected to a heart monitor. Large electrode pads will be placed on the front and back of your chest.  You will be given one or more of the following: ? A medicine to help you relax (sedative). ? A medicine to make you fall asleep (general anesthetic). ? A medicine that is injected into an area of your body to numb the area (local anesthetic).  An incision will be made in your upper chest, near your heart.  The leads will be guided into your incision, through your blood vessels, and into your heart. Your health care provider will use an X-ray machine (fluoroscope) to guide the leads into your heart.  The leads will be attached to your heart muscles and to the pulse generator.  The leads will be tested  to make sure that they work correctly.  The pulse generator will be implanted under your skin, near your incision.  The heart monitor will be watched to ensure that the pacer is working correctly.  Your incision will be closed with stitches (sutures), skin glue, or adhesive tape.  A bandage (dressing) will be placed over your incision. The procedure may vary among health care providers and hospitals.   What happens after the procedure?  Your blood pressure, heart rate, breathing rate, and blood oxygen level will be monitored until you leave the hospital or clinic.  You may continue to receive fluids and medicines through an IV.  You will be given pain medicine as needed.  You will have a chest X-ray done. This  is to make sure that your pacemaker is in the right place.  You will be given a pacemaker identification card. This card lists the implant date, device model, and manufacturer of your pacemaker.  Do not drive for 24 hours if you were given a sedative during your procedure. Summary  A pacemaker is a small, battery-powered device that helps control the heartbeat.  A biventricular pacemaker is used in people with heart failure due to weak heart muscles.  Follow instructions from your health care provider about taking medicines and about eating and drinking before the procedure.  You will be given a pacemaker identification card that lists the implant date, device model, and manufacturer of your pacemaker. This information is not intended to replace advice given to you by your health care provider. Make sure you discuss any questions you have with your health care provider. Document Revised: 04/25/2018 Document Reviewed: 04/25/2018 Elsevier Patient Education  2021 [employer].

## 2020-09-29 NOTE — Progress Notes (Signed)
Electrophysiology Office Follow up Visit Note:    Date:  09/29/2020   ID:  Joshua Moran., DOB May 26, 1946, MRN [id number]  PCP:  Lawerance Cruel, MD  [facility] Cardiologist:  [name], MD  [facility] Electrophysiologist:  Vickie Epley, MD    Interval History:    Joshua Moran. is a 75 y.o. male who presents for a follow up visit.   I last saw the patient June 09, 2020 for his persistent atrial fibrillation.  At that appointment he was maintaining normal rhythm on amiodarone.  He was on Eliquis for stroke prophylaxis.  Since that time he returned to atrial fibrillation which was highly symptomatic with fatigue.  He underwent a cardioversion on September 08, 2020.  This cardioversion was unsuccessful.  Since that time he has continued to be symptomatic with fatigue and decreased exercise tolerance.  He is with his wife again today in clinic.    Past Medical History:  Diagnosis Date  . Abnormal screening CT of chest    coronary calcium in left main and RCA  . Anemia   . Arthritis   . CHF (congestive heart failure) ([place])   . Complication of anesthesia    Pt reports difficult urinating  . Coronary artery calcification seen on CAT scan    no ischemic on nuclear stress test 09/2019  . 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  Cardiac MRI 2021 with EF 39% and moderate RV dysfunction and non-compaction  . Dyspnea    with some activity  . GERD (gastroesophageal reflux disease)    occasional  . Glaucoma   . Hyperlipidemia    statin intolerant at doses > Crestor 5mg  3 times weekly  . OSA on CPAP   . PAF (paroxysmal atrial fibrillation) (HCC)    CHADS2VASC score 2 on apixaban    Past Surgical History:  Procedure Laterality Date  . CARDIOVERSION N/A 04/01/2020   Procedure: CARDIOVERSION;  Surgeon: Skeet Latch, MD;  Location: [place];  Service: Cardiovascular;  Laterality: N/A;  . CARDIOVERSION N/A 09/08/2020    Procedure: CARDIOVERSION;  Surgeon: Pixie Casino, MD;  Location: [facility] ENDOSCOPY;  Service: Cardiovascular;  Laterality: N/A;  . EYE SURGERY     bilateral cataracts  . KNEE ARTHROSCOPY W/ DEBRIDEMENT     left   . RETINAL DETACHMENT SURGERY Bilateral   . RIGHT/LEFT HEART CATH AND CORONARY ANGIOGRAPHY N/A 05/06/2020   Procedure: RIGHT/LEFT HEART CATH AND CORONARY ANGIOGRAPHY;  Surgeon: Larey Dresser, MD;  Location: [place] CV LAB;  Service: Cardiovascular;  Laterality: N/A;  . TOTAL KNEE ARTHROPLASTY Left 01/01/2018   Procedure: LEFT TOTAL KNEE ARTHROPLASTY;  Surgeon: Gaynelle Arabian, MD;  Location: [facility];  Service: Orthopedics;  Laterality: Left;  50 mins  . TOTAL KNEE ARTHROPLASTY Right 06/25/2018   Procedure: TOTAL KNEE ARTHROPLASTY;  Surgeon: Gaynelle Arabian, MD;  Location: [facility];  Service: Orthopedics;  Laterality: Right;  67min  . VASECTOMY      Current Medications: Current Meds  Medication Sig  . acetaminophen (TYLENOL) 500 MG tablet Take 500 mg by mouth every 6 (six) hours as needed (PAIN).  Marland Kitchen apixaban (ELIQUIS) 5 MG TABS tablet Take 1 tablet (5 mg total) by mouth 2 (two) times daily.  . brimonidine (ALPHAGAN) 0.15 % ophthalmic solution Place 1 drop into the right eye at bedtime.  . cetirizine (ZYRTEC) 10 MG tablet Take 10 mg by mouth every evening.  . Cholecalciferol (VITAMIN D3) 50 MCG (  2000 UT) TABS Take 2,000 Units by mouth at bedtime.   . ferrous sulfate 325 (65 FE) MG tablet Take 325 mg by mouth in the morning.  . finasteride (PROSCAR) 5 MG tablet Take 5 mg by mouth every Monday, Wednesday, and Friday. IN THE MORNING  . fluticasone (FLONASE) 50 MCG/ACT nasal spray Place 2 sprays into both nostrils 2 (two) times daily.   . Glucosamine Sulfate 750 MG TABS Take 750 mg by mouth in the morning and at bedtime.  Marland Kitchen losartan (COZAAR) 25 MG tablet TAKE 1/2 TABLET BY MOUTH ONCE DAILY  . Melatonin 10 MG TABS Take 10 mg by mouth at bedtime.   . Multiple Vitamin (MULTIVITAMIN WITH  MINERALS) TABS tablet Take 1 tablet by mouth in the morning.  Marland Kitchen REPATHA SURECLICK 875 MG/ML SOAJ INJECT ONE pen into THE SKIN every 14 DAYS  . rosuvastatin (CRESTOR) 5 MG tablet Take 5 mg by mouth every Monday, Wednesday, and Friday. IN THE EVENING  . sildenafil (REVATIO) 20 MG tablet Take 40-100 mg by mouth daily as needed (erectile dysfunction.).   Marland Kitchen spironolactone (ALDACTONE) 25 MG tablet Take 1 tablet (25 mg total) by mouth at bedtime.  . tamsulosin (FLOMAX) 0.4 MG CAPS capsule Take 0.4 mg by mouth at bedtime.   . [DISCONTINUED] amiodarone (PACERONE) 200 MG tablet Take 1 tablet (200 mg total) by mouth daily.     Allergies:   Xarelto [rivaroxaban], Empagliflozin, Penicillins, Statins, and Vancomycin   Social History   Socioeconomic History  . Marital status: Married    Spouse name: Not on file  . Number of children: Not on file  . Years of education: Not on file  . Highest education level: Not on file  Occupational History  . Not on file  Tobacco Use  . Smoking status: Never Smoker  . Smokeless tobacco: Never Used  Vaping Use  . Vaping Use: Never used  Substance and Sexual Activity  . Alcohol use: Yes    Alcohol/week: 1.0 standard drink    Types: 1 Cans of beer per week    Comment: Rare  . Drug use: No  . Sexual activity: Not Currently  Other Topics Concern  . Not on file  Social History Narrative  . Not on file   Social Determinants of Health   Financial Resource Strain: Not on file  Food Insecurity: Not on file  Transportation Needs: Not on file  Physical Activity: Not on file  Stress: Not on file  Social Connections: Not on file     Family History: The patient's family history includes Lung cancer in his mother.  ROS:   Please see the history of present illness.    All other systems reviewed and are negative.  EKGs/Labs/Other Studies Reviewed:    The following studies were reviewed today:  Cardioversion records  EKG:  The ekg ordered today  demonstrates atrial fibrillation  Recent Labs: 09/08/2020: ALT 18; BUN 28; Creatinine, Ser 1.10; Hemoglobin 14.6; Platelets 224; Potassium 7.7; Sodium 139; TSH 3.638  Recent Lipid Panel    Component Value Date/Time   CHOL 134 05/20/2020 1632   CHOL 127 11/25/2019 1401   TRIG 117 05/20/2020 1632   HDL 45 05/20/2020 1632   HDL 49 11/25/2019 1401   CHOLHDL 3.0 05/20/2020 1632   VLDL 23 05/20/2020 1632   LDLCALC 66 05/20/2020 1632   LDLCALC 60 11/25/2019 1401   LDLDIRECT 62 11/25/2019 1401    Physical Exam:    VS:  BP 112/74   Pulse  73   Ht 6' (1.829 m)   Wt 297 lb (134.7 kg)   SpO2 98%   BMI 40.28 kg/m     Wt Readings from Last 3 Encounters:  09/29/20 297 lb (134.7 kg)  09/29/20 296 lb (134.3 kg)  09/25/20 296 lb (134.3 kg)     GEN:  Well nourished, well developed in no acute distress.  Obese HEENT: Normal NECK: No JVD; No carotid bruits LYMPHATICS: No lymphadenopathy CARDIAC: Irregularly irregular, no murmurs, rubs, gallops RESPIRATORY:  Clear to auscultation without rales, wheezing or rhonchi  ABDOMEN: Soft, non-tender, non-distended MUSCULOSKELETAL:  No edema; No deformity  SKIN: Warm and dry NEUROLOGIC:  Alert and oriented x 3 PSYCHIATRIC:  Normal affect   ASSESSMENT:    1. Persistent atrial fibrillation (HCC)   2. Class 2 severe obesity with serious comorbidity in adult, unspecified BMI, unspecified obesity type ([place])   3. DCM (dilated cardiomyopathy) ([place])   4. Chronic combined systolic and diastolic heart failure (HCC)    PLAN:    In order of problems listed above:  1. Persistent atrial fibrillation Continues to be symptomatic while in atrial fibrillation.  The patient is not maintaining rhythm even on amiodarone.  At this point, I do not think there is a reason to continue his amiodarone.  We will stop it today.  I discussed treatment options moving forward for him including implantation of a CRT with AV junction ablation versus continued rate control.   The patient's EKG shows that he has an intermittent left bundle branch block.  For this reason, I do think he could benefit from CRT implant even outside of the AV junction ablation.  We discussed this procedure in detail during today's visit including the risks, expected recovery and possibility that even after AV junction ablation and CRT-P implant he would remain symptomatic.  He and his wife would like some time to think about it and will let us know if they would like to proceed with scheduling.  In the meantime, he will continue taking his Eliquis.  2.  Morbid obesity Discussed the link between his obesity and poor success rates of atrial fibrillation ablation.  3.  Chronic systolic and diastolic heart failure NYHA class II-III.  Warm and dry on exam.  I do think there is an element of atrial fibrillation contributing to his symptoms.  Left bundle branch block that is intermittent on his EKG raises the question about whether or not he could symptomatically benefit from CRT-P therapy.   Medication Adjustments/Labs and Tests Ordered: Current medicines are reviewed at length with the patient today.  Concerns regarding medicines are outlined above.  Orders Placed This Encounter  Procedures  . EKG 12-Lead   No orders of the defined types were placed in this encounter.    Signed, Lars Mage, MD, [facility], [facility] 09/29/2020 1:43 PM    Electrophysiology [facility]

## 2020-09-30 ENCOUNTER — Ambulatory Visit (HOSPITAL_COMMUNITY): Payer: PPO | Attending: Cardiovascular Disease

## 2020-09-30 DIAGNOSIS — I428 Other cardiomyopathies: Secondary | ICD-10-CM | POA: Diagnosis not present

## 2020-09-30 DIAGNOSIS — I480 Paroxysmal atrial fibrillation: Secondary | ICD-10-CM

## 2020-09-30 LAB — MYOCARDIAL PERFUSION IMAGING
LV dias vol: 159 mL (ref 62–150)
LV sys vol: 93 mL
Peak HR: 80 {beats}/min
Rest HR: 73 {beats}/min
SDS: 3
SRS: 5
SSS: 8
TID: 1.07

## 2020-09-30 MED ORDER — TECHNETIUM TC 99M TETROFOSMIN IV KIT
31.5000 | PACK | Freq: Once | INTRAVENOUS | Status: AC | PRN
Start: 2020-09-30 — End: 2020-09-30
  Administered 2020-09-30: 31.5 via INTRAVENOUS
  Filled 2020-09-30: qty 32

## 2020-10-07 ENCOUNTER — Other Ambulatory Visit: Payer: Self-pay

## 2020-10-07 ENCOUNTER — Telehealth (INDEPENDENT_AMBULATORY_CARE_PROVIDER_SITE_OTHER): Payer: PPO | Admitting: Cardiology

## 2020-10-07 DIAGNOSIS — I5042 Chronic combined systolic (congestive) and diastolic (congestive) heart failure: Secondary | ICD-10-CM | POA: Diagnosis not present

## 2020-10-07 DIAGNOSIS — I4821 Permanent atrial fibrillation: Secondary | ICD-10-CM

## 2020-10-07 DIAGNOSIS — I447 Left bundle-branch block, unspecified: Secondary | ICD-10-CM | POA: Diagnosis not present

## 2020-10-07 NOTE — Progress Notes (Signed)
Virtual Visit via Telephone Note   This visit type was conducted due to national recommendations for restrictions regarding the COVID-19 Pandemic (e.g. social distancing) in an effort to limit this patient's exposure and mitigate transmission in our community.  Due to his co-morbid illnesses, this patient is at least at moderate risk for complications without adequate follow up.  This format is felt to be most appropriate for this patient at this time.  The patient did not have access to video technology/had technical difficulties with video requiring transitioning to audio format only (telephone).  All issues noted in this document were discussed and addressed.  No physical exam could be performed with this format.  Please refer to the patient's chart for his  consent to telehealth for [facility].    Date:  10/07/2020   ID:  Joshua Moran., DOB [birth year], MRN [id number] The patient was identified using 2 identifiers.  Patient Location: Home Provider Location: Office/Clinic   PCP:  Lawerance Cruel, MD   [facility] Providers Cardiologist:  [name], MD Electrophysiologist:  Vickie Epley, MD {  Chief Complaint: Permanent atrial fibrillation  History of Present Illness:    Joshua Moran. is a 75 y.o. male with permanent atrial fibrillation who presents via televisit to discuss proceeding with CRT-P implant and AV junction ablation.  The patient has failed amiodarone and continues to have symptoms related to his atrial fibrillation.  At her previous appointment on September 29, 2020 we discussed CRT-P implant and AV junction ablation but the patient would not dispense them time thinking about it.  He would now like to proceed with implant.  He has a vacation scheduled for June and would like to think about doing the procedure after he returns from his vacation.   Past Medical History:  Diagnosis Date  . Abnormal screening CT of chest    coronary calcium in left main  and RCA  . Anemia   . Arthritis   . CHF (congestive heart failure) ([place])   . Complication of anesthesia    Pt reports difficult urinating  . Coronary artery calcification seen on CAT scan    no ischemic on nuclear stress test 09/2019  . 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  Cardiac MRI 2021 with EF 39% and moderate RV dysfunction and non-compaction  . Dyspnea    with some activity  . GERD (gastroesophageal reflux disease)    occasional  . Glaucoma   . Hyperlipidemia    statin intolerant at doses > Crestor 5mg  3 times weekly  . OSA on CPAP   . PAF (paroxysmal atrial fibrillation) (HCC)    CHADS2VASC score 2 on apixaban   Past Surgical History:  Procedure Laterality Date  . CARDIOVERSION N/A 04/01/2020   Procedure: CARDIOVERSION;  Surgeon: Skeet Latch, MD;  Location: [place];  Service: Cardiovascular;  Laterality: N/A;  . CARDIOVERSION N/A 09/08/2020   Procedure: CARDIOVERSION;  Surgeon: Pixie Casino, MD;  Location: [facility] ENDOSCOPY;  Service: Cardiovascular;  Laterality: N/A;  . EYE SURGERY     bilateral cataracts  . KNEE ARTHROSCOPY W/ DEBRIDEMENT     left   . RETINAL DETACHMENT SURGERY Bilateral   . RIGHT/LEFT HEART CATH AND CORONARY ANGIOGRAPHY N/A 05/06/2020   Procedure: RIGHT/LEFT HEART CATH AND CORONARY ANGIOGRAPHY;  Surgeon: Larey Dresser, MD;  Location: [place] CV LAB;  Service: Cardiovascular;  Laterality: N/A;  . TOTAL KNEE ARTHROPLASTY Left 01/01/2018   Procedure:  LEFT TOTAL KNEE ARTHROPLASTY;  Surgeon: Gaynelle Arabian, MD;  [facility]: [facility];  Service: Orthopedics;  Laterality: Left;  50 mins  . TOTAL KNEE ARTHROPLASTY Right 06/25/2018   Procedure: TOTAL KNEE ARTHROPLASTY;  Surgeon: Gaynelle Arabian, MD;  Location: [facility];  Service: Orthopedics;  Laterality: Right;  55min  . VASECTOMY       No outpatient medications have been marked as taking for the 10/07/20 encounter (Telemedicine) with Vickie Epley, MD.      Allergies:   Xarelto [rivaroxaban], Empagliflozin, Penicillins, Statins, and Vancomycin   Social History   Tobacco Use  . Smoking status: Never Smoker  . Smokeless tobacco: Never Used  Vaping Use  . Vaping Use: Never used  Substance Use Topics  . Alcohol use: Yes    Alcohol/week: 1.0 standard drink    Types: 1 Cans of beer per week    Comment: Rare  . Drug use: No     Family Hx: The patient's family history includes Lung cancer in his mother.  ROS:   Please see the history of present illness.     All other systems reviewed and are negative.   Labs/Other Tests and Data Reviewed:    EKG:  No ECG reviewed.  Recent Labs: 09/08/2020: ALT 18; BUN 28; Creatinine, Ser 1.10; Hemoglobin 14.6; Platelets 224; Potassium 7.7; Sodium 139; TSH 3.638   Recent Lipid Panel Lab Results  Component Value Date/Time   CHOL 134 05/20/2020 04:32 PM   CHOL 127 11/25/2019 02:01 PM   TRIG 117 05/20/2020 04:32 PM   HDL 45 05/20/2020 04:32 PM   HDL 49 11/25/2019 02:01 PM   CHOLHDL 3.0 05/20/2020 04:32 PM   LDLCALC 66 05/20/2020 04:32 PM   LDLCALC 60 11/25/2019 02:01 PM   LDLDIRECT 62 11/25/2019 02:01 PM    Wt Readings from Last 3 Encounters:  09/29/20 297 lb (134.7 kg)  09/29/20 296 lb (134.3 kg)  09/25/20 296 lb (134.3 kg)      Objective:    Vital Signs:  There were no vitals taken for this visit.    ASSESSMENT & PLAN:    1. Chronic combined systolic and diastolic heart failure with a left bundle branch block NYHA class II-III.  Ejection fraction 45%.  Nonischemic.  Continue current medications.  2.  Permanent atrial fibrillation Symptomatic.  Failed amiodarone and cardioversion.  Plan to proceed with CRT-P implant and AV junction ablation.  I discussed the procedures in detail during her last visit and again today including the risks, expected recovery time and he wishes to proceed.  Risks, benefits, and alternatives to CRT-D and AVJ ablation were discussed in detail today.   The patient understands that risks include but are not limited to bleeding, infection, pneumothorax, perforation, tamponade, vascular damage, renal failure, MI, stroke, death, inappropriate shocks and lead dislodgement and wishes to proceed.  We will therefore schedule the procedure at the next available time.  We will plan for him to hold his Eliquis for 2 days prior to the procedure.  3.  Left bundle-branch block CRT-P as above.  4.  Obesity   Total time of encounter: 20 minutes total time of encounter, including face-to-face patient care, coordination of care and counseling regarding high complexity medical decision making re: atrial fibrillation.      Medication Adjustments/Labs and Tests Ordered: Current medicines are reviewed at length with the patient today.  Concerns regarding medicines are outlined above.   Tests Ordered: No orders of the defined types were placed in this  encounter.   Medication Changes: No orders of the defined types were placed in this encounter.   Signed, Vickie Epley, MD  10/07/2020 1:14 PM    [facility]

## 2020-10-08 ENCOUNTER — Encounter: Payer: Self-pay | Admitting: Adult Health

## 2020-10-08 DIAGNOSIS — H539 Unspecified visual disturbance: Secondary | ICD-10-CM | POA: Diagnosis not present

## 2020-10-08 DIAGNOSIS — I428 Other cardiomyopathies: Secondary | ICD-10-CM | POA: Diagnosis not present

## 2020-10-08 DIAGNOSIS — R42 Dizziness and giddiness: Secondary | ICD-10-CM | POA: Diagnosis not present

## 2020-10-08 DIAGNOSIS — T148XXA Other injury of unspecified body region, initial encounter: Secondary | ICD-10-CM | POA: Diagnosis not present

## 2020-10-08 DIAGNOSIS — E7800 Pure hypercholesterolemia, unspecified: Secondary | ICD-10-CM | POA: Diagnosis not present

## 2020-10-08 DIAGNOSIS — I480 Paroxysmal atrial fibrillation: Secondary | ICD-10-CM | POA: Diagnosis not present

## 2020-10-08 DIAGNOSIS — Z6841 Body Mass Index (BMI) 40.0 and over, adult: Secondary | ICD-10-CM | POA: Diagnosis not present

## 2020-10-08 DIAGNOSIS — I4891 Unspecified atrial fibrillation: Secondary | ICD-10-CM | POA: Diagnosis not present

## 2020-10-08 DIAGNOSIS — D649 Anemia, unspecified: Secondary | ICD-10-CM | POA: Diagnosis not present

## 2020-10-09 ENCOUNTER — Telehealth: Payer: Self-pay | Admitting: Cardiology

## 2020-10-09 DIAGNOSIS — I4821 Permanent atrial fibrillation: Secondary | ICD-10-CM

## 2020-10-09 DIAGNOSIS — I5042 Chronic combined systolic (congestive) and diastolic (congestive) heart failure: Secondary | ICD-10-CM

## 2020-10-09 DIAGNOSIS — I447 Left bundle-branch block, unspecified: Secondary | ICD-10-CM

## 2020-10-09 NOTE — Telephone Encounter (Signed)
Please advise on timeframe for follow up .

## 2020-10-09 NOTE — Telephone Encounter (Signed)
Pt agreeable to 7/8 for procedure. Aware I will be in touch to schedule pre procedure labs, Covid screening and review procedure instructions. Patient verbalized understanding and agreeable to plan.

## 2020-10-12 ENCOUNTER — Telehealth: Payer: Self-pay | Admitting: Cardiology

## 2020-10-12 DIAGNOSIS — H18033 Corneal deposits in metabolic disorders, bilateral: Secondary | ICD-10-CM | POA: Diagnosis not present

## 2020-10-12 DIAGNOSIS — H401113 Primary open-angle glaucoma, right eye, severe stage: Secondary | ICD-10-CM | POA: Diagnosis not present

## 2020-10-12 NOTE — Telephone Encounter (Signed)
Patient is returning call to discuss monitor results. 

## 2020-10-12 NOTE — Telephone Encounter (Signed)
Pt scheduled for 12/11/20   Labs/covid test scheduled.  Instruction letter sent via mychart.  Work up complete.  Await further needs.

## 2020-10-12 NOTE — Telephone Encounter (Signed)
The patient has been notified of the result and verbalized understanding.  All questions (if any) were answered. Antonieta Iba, RN 10/12/2020 4:54 PM

## 2020-10-13 ENCOUNTER — Encounter (HOSPITAL_COMMUNITY): Payer: Self-pay | Admitting: Cardiology

## 2020-10-13 ENCOUNTER — Other Ambulatory Visit: Payer: Self-pay

## 2020-10-13 ENCOUNTER — Ambulatory Visit (HOSPITAL_COMMUNITY)
Admission: RE | Admit: 2020-10-13 | Discharge: 2020-10-13 | Disposition: A | Discharge status: Home / Self Care | Payer: PPO | Source: Ambulatory Visit | Attending: Cardiology | Admitting: Cardiology

## 2020-10-13 VITALS — BP 108/70 | HR 75 | Wt 297.8 lb

## 2020-10-13 DIAGNOSIS — I429 Cardiomyopathy, unspecified: Secondary | ICD-10-CM | POA: Diagnosis not present

## 2020-10-13 DIAGNOSIS — I5042 Chronic combined systolic (congestive) and diastolic (congestive) heart failure: Secondary | ICD-10-CM

## 2020-10-13 DIAGNOSIS — I420 Dilated cardiomyopathy: Secondary | ICD-10-CM | POA: Diagnosis not present

## 2020-10-13 DIAGNOSIS — I2510 Atherosclerotic heart disease of native coronary artery without angina pectoris: Secondary | ICD-10-CM | POA: Diagnosis not present

## 2020-10-13 DIAGNOSIS — I951 Orthostatic hypotension: Secondary | ICD-10-CM | POA: Diagnosis not present

## 2020-10-13 DIAGNOSIS — Z79899 Other long term (current) drug therapy: Secondary | ICD-10-CM | POA: Insufficient documentation

## 2020-10-13 DIAGNOSIS — R0602 Shortness of breath: Secondary | ICD-10-CM | POA: Insufficient documentation

## 2020-10-13 DIAGNOSIS — I447 Left bundle-branch block, unspecified: Secondary | ICD-10-CM | POA: Diagnosis not present

## 2020-10-13 DIAGNOSIS — Z7901 Long term (current) use of anticoagulants: Secondary | ICD-10-CM | POA: Diagnosis not present

## 2020-10-13 DIAGNOSIS — Z8673 Personal history of transient ischemic attack (TIA), and cerebral infarction without residual deficits: Secondary | ICD-10-CM | POA: Diagnosis not present

## 2020-10-13 DIAGNOSIS — I4819 Other persistent atrial fibrillation: Secondary | ICD-10-CM | POA: Diagnosis not present

## 2020-10-13 DIAGNOSIS — R42 Dizziness and giddiness: Secondary | ICD-10-CM | POA: Diagnosis not present

## 2020-10-13 DIAGNOSIS — I5022 Chronic systolic (congestive) heart failure: Secondary | ICD-10-CM | POA: Insufficient documentation

## 2020-10-13 DIAGNOSIS — G4733 Obstructive sleep apnea (adult) (pediatric): Secondary | ICD-10-CM | POA: Insufficient documentation

## 2020-10-13 LAB — BASIC METABOLIC PANEL
Anion gap: 6 (ref 5–15)
BUN: 17 mg/dL (ref 8–23)
CO2: 25 mmol/L (ref 22–32)
Calcium: 8.7 mg/dL — ABNORMAL LOW (ref 8.9–10.3)
Chloride: 106 mmol/L (ref 98–111)
Creatinine, Ser: 1.07 mg/dL (ref 0.61–1.24)
GFR, Estimated: >60 mL/min (ref >60)
Glucose, Bld: 81 mg/dL (ref 70–99)
Potassium: 4.1 mmol/L (ref 3.5–5.1)
Sodium: 137 mmol/L (ref 135–145)

## 2020-10-13 LAB — BRAIN NATRIURETIC PEPTIDE: B Natriuretic Peptide: 100.3 pg/mL — ABNORMAL HIGH (ref 0.0–100.0)

## 2020-10-13 MED ORDER — FUROSEMIDE 20 MG PO TABS: 20.0000 mg | ORAL_TABLET | Freq: Every day | ORAL | 2 refills | Status: DC

## 2020-10-13 NOTE — Progress Notes (Signed)
PCP: Lawerance Cruel, MD Cardiology: Dr. Radford Pax HF Cardiology: Dr. Aundra Dubin  75 y.o. with history of chronic systolic CHF and paroxysmal atrial fibrillation was referred by Dr. Radford Pax for evaluation of CHF.  Patient has been short of breath for years, worse over the last 6 months.  He has had an extensive workup.  Echo in 3/21 showed EF 45-50% with mildly decreased RV function.  Cardiolite in 4/21 showed fixed apical defect.  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  PYP scan in 8/21 was not suggestive of TTR cardiac amyloidosis.   He had been in persistent atrial fibrillation for about 2 years.  He saw Dr. Quentin Ore and was started on amiodarone.  DCCV was done in 10/21 back to NSR.  He was found to have M-spike on SPEP.  He saw Dr. Marin Olp who thought this was MGUS.   LHC/RHC in 12/21 showed occluded small OM2 and 80-90% stenosis mid PLOM (small caliber), no interventional target; normal filling pressures and preserved cardiac output.   He was unable to tolerate Jardiance due to extreme dizziness. He is off both Coreg and Toprol XL due to lightheadedness/hypotension.   He developed recurrent atrial fibrillation and failed DCCV in 4/22.  He has been in persistent atrial fibrillation, Dr Quentin Ore stopped his amiodarone due to futility.    Patient returns for followup of CHF.  Using CPAP.  He is in atrial fibrillation today.  Weight is up 2 lbs.  He is short of breath walking about 200-300 feet.  Generally ok walking around the house.  No chest pain.  No orthopnea/PND.  Feels worse overall, more fatigued, in atrial fibrillation. Not getting lightheaded or dizzy currently.   ECG (personally reviewed): atrial fibrillation, LAFB, QRS 102 msec  REDS clip 33%  Labs (7/21): Urine immunofixation with monoclonal IgG protein, SPEP with M-spike.   Labs (10/21): TSH normal, K 4, creatinine 0.97, LFTs normal Labs (11/21):  K 4.8, creatinine 1.07, hgb 14.7, LFTs normal, TSH normal Labs (12/21): LDL 66, HDL 45 Labs (1/22): K 3.9, creatinine 1.07 Labs (4/22): K 4.5, creatinine 1.14, TSH normal, hgb 13.8  PMH: 1. CVA: Occurred in 3/21, he was not on apixaban at the time.  2. GERD 3. OSA: Uses CPAP.  4. Hyperlipidemia 5. Atrial fibrillation: Paroxysmal.  - DCCV to NSR in 10/21.  - Failed DCCV in 4/22, stopped amiodarone.  6. MGUS 7. Chronic systolic CHF: Echo (4/33) with EF 45-50%, mild LVH, mildly decreased RV systolic function. Primarily nonischemic cardiomyopathy.  - Cardiolite (4/21): Fixed apical defect.  - Cardiac MRI (9/21): Mild LV dilation with mild LV hypertrophy, EF 39%, prominent trabeculations concerning for noncompaction. Mild RV dilation with EF 28%, severe LAE.  No delayed enhancement.  - PYP scan (8/21): grade 1, H/CL 1-1.5.  Unlikely to have cardiac amyloidosis.  - LHC/RHC (12/21): small OM2 totally occluded, 80-90% mid small PLOM (no interventional target); mean RA 5, PA 25/9, mean PCWP 5, CI 2.77 Fick/3.29 Thermo.  8. Orthostatic hypotension.  9. CAD: Coronary angiography 12/21 with small OM2 totally occluded, 80-90% mid small PLOM (no interventional target)  Social History   Socioeconomic History  . Marital status: Married    Spouse name: Not on file  . Number of children: Not on file  . Years of education: Not on file  . Highest education level: Not on file  Occupational History  . Not on file  Tobacco Use  .  Smoking status: Never Smoker  . Smokeless tobacco: Never Used  Vaping Use  . Vaping Use: Never used  Substance and Sexual Activity  . Alcohol use: Yes    Alcohol/week: 1.0 standard drink    Types: 1 Cans of beer per week    Comment: Rare  . Drug use: No  . Sexual activity: Not Currently  Other Topics Concern  . Not on file  Social History Narrative  . Not on file   Social Determinants of Health   Financial Resource Strain: Not on file  Food Insecurity: Not on  file  Transportation Needs: Not on file  Physical Activity: Not on file  Stress: Not on file  Social Connections: Not on file  Intimate Partner Violence: Not on file   Family History  Problem Relation Age of Onset  . Lung cancer Mother        smoker   ROS: All systems reviewed and negative except as per HPI.   Current Outpatient Medications  Medication Sig Dispense Refill  . acetaminophen (TYLENOL) 500 MG tablet Take 500 mg by mouth every 6 (six) hours as needed (PAIN).    Marland Kitchen apixaban (ELIQUIS) 5 MG TABS tablet Take 1 tablet (5 mg total) by mouth 2 (two) times daily. 60 tablet 0  . brimonidine (ALPHAGAN) 0.15 % ophthalmic solution Place 1 drop into the right eye at bedtime.    . cetirizine (ZYRTEC) 10 MG tablet Take 10 mg by mouth every evening.    . Cholecalciferol (VITAMIN D3) 50 MCG (2000 UT) TABS Take 2,000 Units by mouth at bedtime.     . ferrous sulfate 325 (65 FE) MG tablet Take 325 mg by mouth in the morning.    . finasteride (PROSCAR) 5 MG tablet Take 5 mg by mouth every Monday, Wednesday, and Friday. IN THE MORNING    . fluticasone (FLONASE) 50 MCG/ACT nasal spray Place 2 sprays into both nostrils 2 (two) times daily.     . furosemide (LASIX) 20 MG tablet Take 1 tablet (20 mg total) by mouth daily. 30 tablet 2  . Glucosamine Sulfate 750 MG TABS Take 750 mg by mouth in the morning and at bedtime.    Marland Kitchen losartan (COZAAR) 25 MG tablet TAKE 1/2 TABLET BY MOUTH ONCE DAILY 45 tablet 3  . Melatonin 10 MG TABS Take 10 mg by mouth at bedtime.     . Multiple Vitamin (MULTIVITAMIN WITH MINERALS) TABS tablet Take 1 tablet by mouth in the morning.    Marland Kitchen REPATHA SURECLICK 481 MG/ML SOAJ INJECT ONE pen into THE SKIN every 14 DAYS 6 mL 3  . rosuvastatin (CRESTOR) 5 MG tablet Take 5 mg by mouth every Monday, Wednesday, and Friday. IN THE EVENING    . sildenafil (REVATIO) 20 MG tablet Take 40-100 mg by mouth daily as needed (erectile dysfunction.).   5  . spironolactone (ALDACTONE) 25 MG tablet  Take 1 tablet (25 mg total) by mouth at bedtime. 90 tablet 3  . tamsulosin (FLOMAX) 0.4 MG CAPS capsule Take 0.4 mg by mouth at bedtime.      No current facility-administered medications for this encounter.   BP 108/70   Pulse 75   Wt 135.1 kg (297 lb 12.8 oz)   SpO2 97%   BMI 40.39 kg/m  General: NAD Neck: No JVD, no thyromegaly or thyroid nodule.  Lungs: Clear to auscultation bilaterally with normal respiratory effort. CV: Nondisplaced PMI.  Heart irregular S1/S2, no S3/S4, no murmur.  No peripheral edema.  No carotid bruit.  Normal pedal pulses.  Abdomen: Soft, nontender, no hepatosplenomegaly, no distention.  Skin: Intact without lesions or rashes.  Neurologic: Alert and oriented x 3.  Psych: Normal affect. Extremities: No clubbing or cyanosis.  HEENT: Normal.   Assessment/Plan: 1. Chronic systolic CHF: Cardiac MRI in 9/21 with EF 39% and evidence for noncompaction cardiomyopathy, no delayed enhancement, significant RV dysfunction with RV EF 22%.  PYP scan was not suggestive of TTR amyloidosis.  12/21 LHC showed CAD but does not explain cardiomyopathy, no interventional target.  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  He has a history of orthostatic hypotension which has limited medication titration but this is better in NSR as well.  He is feels worse, more fatigue and dyspnea, in atrial fibrillation.  Has been in atrial fibrillation now at least for a couple of months and failed DCCV on amiodarone.  On exam, he is not markedly volume overloaded.  REDS clip is somewhat borderline at 33%.  - I will try him on Lasix 20 mg daily to see if it helps his symptoms.  BMET/BNP today and BMET in 10 days.  - He did not tolerate Jardiance due to dizziness.  - Continue spironolactone 25 mg daily.  - Continue losartan to 12.5 mg daily. Suspect BP will not tolerate transition to Entresto.  - Unable to tolerate Coreg or Toprol XL due to lightheadedness/low BP.  2.  Atrial fibrillation: Persistent.  He has severe LAE. Not good candidate for atrial fibrillation ablation per EP.  He failed DCCV on amiodarone in 4/22, amiodarone stopped.  He feels much worse in atrial fibrillation. He has intermittent LBBB, and Dr. Quentin Ore has offered him AV nodal ablation with CRT-P.  This is planned for 7/22.  - Off amiodarone given inability to maintain NSR.  - Continue apixaban 5 mg bid.  - AV nodal ablation with CRT-P in 7/22.  3. OSA: Continue CPAP.  4. Orthostatic hypotension: Limits medications.   - Continue compression stockings and follow over time.   5. CAD: Moderate disease on cath in 12/21, no intervention target.  No chest pain.  Extent of disease does not explain cardiomyopathy.   - Continue Crestor and Repatha, good lipids in 12/21.  - He is on apixaban with stable CAD, so no ASA.   Loralie Champagne 10/13/2020

## 2020-10-13 NOTE — Telephone Encounter (Signed)
Yes but he stated "she suggested I let you know" - not sure if this means just as an FYI or if PCP requested additional work-up from our office?

## 2020-10-13 NOTE — Telephone Encounter (Signed)
Did PCP do any work up?  Could be multiple contributing factors  but if he wishes, we can have him come in to the office for further evaluation and discussion.

## 2020-10-13 NOTE — Patient Instructions (Addendum)
EKG done today.  Labs done today. We will contact you only if your labs are abnormal.  START Lasix 20mg  (1 tablet) by mouth daily.   No other medication changes were made. Please continue all current medications as prescribed.  Your physician recommends that you schedule a follow-up appointment in: 10 days for a lab only appointment and in 3 months with Dr. Aundra Dubin.  If you have any questions or concerns before your next appointment please send [place] a message through [place] or call our office at [phone].    TO LEAVE A MESSAGE FOR THE NURSE SELECT OPTION 2, PLEASE LEAVE A MESSAGE INCLUDING: . YOUR NAME . DATE OF BIRTH . CALL BACK NUMBER . REASON FOR CALL**this is important as we prioritize the call backs  YOU WILL RECEIVE A CALL BACK THE SAME DAY AS LONG AS YOU CALL BEFORE 4:00 PM   Do the following things EVERYDAY: 1) Weigh yourself in the morning before breakfast. Write it down and keep it in a log. 2) Take your medicines as prescribed 3) Eat low salt foods--Limit salt (sodium) to 2000 mg per day.  4) Stay as active as you can everyday 5) Limit all fluids for the day to less than 2 liters   At the [facility], you and your health needs are our priority. As part of our continuing mission to provide you with exceptional heart care, we have created designated Provider Care Teams. These Care Teams include your primary Cardiologist (physician) and Advanced Practice Providers (APPs- Physician Assistants and Nurse Practitioners) who all work together to provide you with the care you need, when you need it.   You may see any of the following providers on your designated Care Team at your next follow up: Marland Kitchen Dr Glori Bickers . Dr Loralie Champagne . Darrick Grinder, NP . Lyda Jester, PA . Audry Riles, PharmD   Please be sure to bring in all your medications bottles to every appointment.

## 2020-10-14 ENCOUNTER — Telehealth: Payer: Self-pay | Admitting: *Deleted

## 2020-10-14 NOTE — Telephone Encounter (Signed)
Spoke to pt.  He has seen pcp and CD.  pcp mentioned thought neuro related.  He states that this has been going on for over a year,  more at night  Lying down.  Can last few seconds or all day.  Can be occasional .  It is not related to hypotension.  I made appt to evaluate.  Ok to come in early am, as not other availability until next month.  He appreciated this.

## 2020-10-15 ENCOUNTER — Encounter: Payer: Self-pay | Admitting: Adult Health

## 2020-10-15 ENCOUNTER — Ambulatory Visit: Payer: PPO | Admitting: Adult Health

## 2020-10-15 VITALS — BP 105/69 | HR 93 | Ht 72.0 in | Wt 297.0 lb

## 2020-10-15 DIAGNOSIS — G4733 Obstructive sleep apnea (adult) (pediatric): Secondary | ICD-10-CM

## 2020-10-15 DIAGNOSIS — I4819 Other persistent atrial fibrillation: Secondary | ICD-10-CM | POA: Diagnosis not present

## 2020-10-15 DIAGNOSIS — H5319 Other subjective visual disturbances: Secondary | ICD-10-CM | POA: Diagnosis not present

## 2020-10-15 DIAGNOSIS — Z9989 Dependence on other enabling machines and devices: Secondary | ICD-10-CM

## 2020-10-15 DIAGNOSIS — E785 Hyperlipidemia, unspecified: Secondary | ICD-10-CM | POA: Diagnosis not present

## 2020-10-15 DIAGNOSIS — I639 Cerebral infarction, unspecified: Secondary | ICD-10-CM

## 2020-10-15 MED ORDER — BACLOFEN 5 MG PO TABS: 2.5000 mg | ORAL_TABLET | Freq: Three times a day (TID) | ORAL | 5 refills | Status: DC

## 2020-10-15 NOTE — Patient Instructions (Signed)
Concern of possible oscillopsia  Referral placed to see Dr. Hassell Done who is a neuro-ophthalmologist at [facility] - you will be called to schedule  Recommend trial of baclofen 2.5mg  3 times daily and may increase to 5mg  3 times daily if needed for acute episodes     Follow-up in 3 months with Dr. [name] or call earlier if needed

## 2020-10-15 NOTE — Progress Notes (Signed)
[facility] [address] [place]. [place] [address] [phone]       STROKE FOLLOW UP NOTE  [name]. Date of Birth:  [birth year] Medical Record Number:  [id number]   Reason for visit: Vertigo    CHIEF COMPLAINT:  Chief Complaint  Patient presents with  . Follow-up    RM 21 with wife (Joshua Moran) Pt is well, having symptoms of things moving like on the wall, seen PCP referred him back here.    HPI:  Today, 10/15/2020, Joshua Moran returns for acute visit accompanied by his wife due to recent onset of vertigo different from his dizziness typically related to low blood pressure.    Reports longstanding history of episodes at least over the past couple of years.  Can occur every couple of months and at times can last for only 5 minutes but other times can last for couple of days.  Describes episode as objects in his environment will be moving such as a poster on the wall or the words on a book. At times he can stop the symptoms when he closes his eyes for a few minutes.  Denies double or blurred vision or room spinning type sensation.  Denies symptoms with quick head movement or position changes.  Denies associated headache or N/V or weakness, numbness, speech changes or cognitive changes. chronic imbalance with worsening imbalance during this time.  Chronic hearing loss and tinnitus as well as chronic sinus issues routinely followed by ENT.  History of retinal detachment s/p repair 2005 and 2008.      History provided for reference purposes only Update 07/20/2020 JM: Joshua Moran returns for 73-month stroke follow-up accompanied by his wife.  He has been doing well since prior visit without new stroke/TIA symptoms.  He does c/o dizziness especially after initiating Jardiance and has improved since discontinuing but will continue to have occasional dizziness in relation to lower blood pressure readings.  He continues to follow with cardiology routinely for blood pressure  management as this has been difficult to control.  Blood pressure today 106/72.  Reports compliance with Eliquis, Repatha and Crestor tolerating without side effects.  Reports ongoing compliance with CPAP for OSA management.  No further concerns at this time.  Update 01/15/2020 JM: Joshua Moran returns for stroke follow-up accompanied by his wife He has been stable without residual stroke deficits and denies new or recurring stroke/TIA symptoms He does report intermittent occipital headaches that have been present since his stroke and typically subside with use of Tylenol.  Headaches are not debilitating.  Denies visual changes or photophobia, phonophobia or nausea/vomiting Remains on Eliquis for secondary stroke prevention history of A. fib without bleeding or bruising Initiating Repatha in addition to Crestor for HLD management with history of statin intolerance managed by cardiology.  Recent lipid panel 11/25/2019 showed LDL 60 Blood pressure today 112/78 Diagnosed with sleep apnea and reports ongoing use of CPAP No concerns at this time  Update 09/11/2019 JM: Joshua Moran is being seen for hospital follow-up complaint by his wife.  He has recovered well from a stroke standpoint without residual deficits or new/reoccurring stroke/TIAs.  He continues on Eliquis without bleeding or bruising.  Continues on Crestor 5 mg 3 times weekly but recently referred to lipid clinic for potential initiation of PCSK9 inhibitor with history of statin intolerance.  Blood pressure today 102/64 and reports occasional episodes of lower blood pressure with dizziness and feels as though related to dehydration. Blood pressure at home  typically 120-130/80s.  He also plans on undergoing split-night study for possible sleep apnea at the end of this month.  Continues to follow with cardiology and PCP regularly for chronic disease management.  No concerns at this time.  Stroke admission 08/06/2019: [name]. is a 75 y.o. male  with history of atrial fibrillation not on [facility] d/t low CHADSVASC score who presented on 08/06/2019 with recurrent aphasia.  Evaluated by stroke team and Dr. Leonie Moran with stroke work-up revealing left frontal lobe infarct embolic pattern in setting of known AF not on AC.  Recommend initiating Eliquis for secondary stroke prevention with CHA2DS2-VASc score 3 (prior 1).  LDL 106 previously on Crestor with history of multiple statin intolerances and recommended consideration of PCSK9 at outpatient follow-up for optimal cholesterol control.  No prior history of HTN or DM.  No prior history of stroke.  Discharged home in stable condition without therapy needs.  Stroke:   L frontal lobe infarct embolic in setting of known AF not on [facility]  Code Stroke CT head No acute abnormality. ASPECTS 10.     CTA head & neck no LVO, mild atherosclerosis head and neck. 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 Sinus dz  2D Echo ejection fraction 45 to 50%.  Mild left ventricular hypertrophy.  No cardiac source of embolism.    LDL 106  HgbA1c 5.8  Eliquis for VTE prophylaxis  aspirin 81 mg daily prior to admission, now on Eliquis (apixaban) daily. Continue Eliquis at d/c  Therapy recommendations: No PT OT needed   disposition: Home        ROS:   14 system review of systems performed and negative with exception of those listed in HPI  PMH:  Past Medical History:  Diagnosis Date  . Abnormal screening CT of chest    coronary calcium in left main and RCA  . Anemia   . Arthritis   . CHF (congestive heart failure) ([facility])   . Complication of anesthesia    Pt reports difficult urinating  . Coronary artery calcification seen on CAT scan    no ischemic on nuclear stress test 09/2019  . 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  Cardiac MRI 2021 with EF 39% and moderate RV  dysfunction and non-compaction  . Dyspnea    with some activity  . GERD (gastroesophageal reflux disease)    occasional  . Glaucoma   . Hyperlipidemia    statin intolerant at doses > Crestor 5mg  3 times weekly  . OSA on CPAP   . PAF (paroxysmal atrial fibrillation) ([facility])    CHADS2VASC score 2 on apixaban    PSH:  Past Surgical History:  Procedure Laterality Date  . CARDIOVERSION N/A 04/01/2020   Procedure: CARDIOVERSION;  Surgeon: Skeet Latch, MD;  Location: [facility];  Service: Cardiovascular;  Laterality: N/A;  . CARDIOVERSION N/A 09/08/2020   Procedure: CARDIOVERSION;  Surgeon: Pixie Casino, MD;  [facility] ENDOSCOPY;  Service: Cardiovascular;  Laterality: N/A;  . EYE SURGERY     bilateral cataracts  . KNEE ARTHROSCOPY W/ DEBRIDEMENT     left   . RETINAL DETACHMENT SURGERY Bilateral   . RIGHT/LEFT HEART CATH AND CORONARY ANGIOGRAPHY N/A 05/06/2020   Procedure: RIGHT/LEFT HEART CATH AND CORONARY ANGIOGRAPHY;  Surgeon: Larey Dresser, MD;  Location: [facility] CV LAB;  Service: Cardiovascular;  Laterality: N/A;  . TOTAL KNEE ARTHROPLASTY Left 01/01/2018   Procedure: LEFT TOTAL KNEE ARTHROPLASTY;  Surgeon: Gaynelle Arabian, MD;  Location: [facility] ORS;  Service: Orthopedics;  Laterality: Left;  50 mins  . TOTAL KNEE ARTHROPLASTY Right 06/25/2018   Procedure: TOTAL KNEE ARTHROPLASTY;  Surgeon: Gaynelle Arabian, MD;  Location: [facility] ORS;  Service: Orthopedics;  Laterality: Right;  30min  . VASECTOMY      Social History:  Social History   Socioeconomic History  . Marital status: Married    Spouse name: Not on file  . Number of children: Not on file  . Years of education: Not on file  . Highest education level: Not on file  Occupational History  . Not on file  Tobacco Use  . Smoking status: Never Smoker  . Smokeless tobacco: Never Used  Vaping Use  . Vaping Use: Never used  Substance and Sexual Activity  . Alcohol use: Yes    Alcohol/week: 1.0 standard drink    Types:  1 Cans of beer per week    Comment: Rare  . Drug use: No  . Sexual activity: Not Currently  Other Topics Concern  . Not on file  Social History Narrative  . Not on file   Social Determinants of Health   Financial Resource Strain: Not on file  Food Insecurity: Not on file  Transportation Needs: Not on file  Physical Activity: Not on file  Stress: Not on file  Social Connections: Not on file  Intimate Partner Violence: Not on file    Family History:  Family History  Problem Relation Age of Onset  . Lung cancer Mother        smoker    Medications:   Current Outpatient Medications on File Prior to Visit  Medication Sig Dispense Refill  . acetaminophen (TYLENOL) 500 MG tablet Take 500 mg by mouth every 6 (six) hours as needed (PAIN).    Marland Kitchen apixaban (ELIQUIS) 5 MG TABS tablet Take 1 tablet (5 mg total) by mouth 2 (two) times daily. 60 tablet 0  . brimonidine (ALPHAGAN) 0.15 % ophthalmic solution Place 1 drop into the right eye at bedtime.    . cetirizine (ZYRTEC) 10 MG tablet Take 10 mg by mouth every evening.    . Cholecalciferol (VITAMIN D3) 50 MCG (2000 UT) TABS Take 2,000 Units by mouth at bedtime.     . ferrous sulfate 325 (65 FE) MG tablet Take 325 mg by mouth in the morning.    . finasteride (PROSCAR) 5 MG tablet Take 5 mg by mouth every Monday, Wednesday, and Friday. IN THE MORNING    . fluticasone (FLONASE) 50 MCG/ACT nasal spray Place 2 sprays into both nostrils 2 (two) times daily.     . furosemide (LASIX) 20 MG tablet Take 1 tablet (20 mg total) by mouth daily. 30 tablet 2  . Glucosamine Sulfate 750 MG TABS Take 750 mg by mouth in the morning and at bedtime.    Marland Kitchen losartan (COZAAR) 25 MG tablet TAKE 1/2 TABLET BY MOUTH ONCE DAILY 45 tablet 3  . Melatonin 10 MG TABS Take 10 mg by mouth at bedtime.     . Multiple Vitamin (MULTIVITAMIN WITH MINERALS) TABS tablet Take 1 tablet by mouth in the morning.    Marland Kitchen REPATHA SURECLICK [id number] MG/ML SOAJ INJECT ONE pen into THE SKIN every  14 DAYS 6 mL 3  . rosuvastatin (CRESTOR) 5 MG tablet Take 5 mg by mouth every Monday, Wednesday, and Friday. IN THE EVENING    .  sildenafil (REVATIO) 20 MG tablet Take 40-100 mg by mouth daily as needed (erectile dysfunction.).   5  . spironolactone (ALDACTONE) 25 MG tablet Take 1 tablet (25 mg total) by mouth at bedtime. 90 tablet 3  . tamsulosin (FLOMAX) 0.4 MG CAPS capsule Take 0.4 mg by mouth at bedtime.      No current facility-administered medications on file prior to visit.    Allergies:   Allergies  Allergen Reactions  . Xarelto [Rivaroxaban] Rash    Patient denies allergy as of 08/07/19  . Empagliflozin Other (See Comments)    (JARDIANCE) dizziness  . Penicillins Other (See Comments)    Very flushed  Has patient had a PCN reaction causing immediate rash, facial/tongue/throat swelling, SOB or lightheadedness with hypotension: No Has patient had a PCN reaction causing severe rash involving mucus membranes or skin necrosis: No Has patient had a PCN reaction that required hospitalization No Has patient had a PCN reaction occurring within the last 10 years: No If all of the above answers are "NO", then may proceed with Cephalosporin use.   . Statins Other (See Comments)    Pt c/o muscle aches with use of statins.  . Vancomycin Rash     Physical Exam  Vitals:   10/15/20 0756  BP: 105/69  Pulse: 93  Weight: 297 lb (134.7 kg)  Height: 6' (1.829 m)   Body mass index is 40.28 kg/m. No exam data present  General: well developed, well nourished, pleasant elderly Caucasian male, seated, in no evident distress Head: head normocephalic and atraumatic.   Neck: supple with no carotid or supraclavicular bruits Cardiovascular: regular rate and rhythm, no murmurs Musculoskeletal: no deformity Skin:  no rash/petichiae Vascular:  Normal pulses all extremities   Neurologic Exam Mental Status: Awake and fully alert. Normal speech and language. Oriented to place and time. Recent and  remote memory intact. Attention span, concentration and fund of knowledge appropriate. Mood and affect appropriate.  Cranial Nerves: Pupils equal, briskly reactive to light. Extraocular movements full without nystagmus. Visual fields full to confrontation. Right eye ptosis - hx of detached retina. Hearing intact. Facial sensation intact. Face, tongue, palate moves normally and symmetrically.  Motor: Normal bulk and tone. Normal strength in all tested extremity muscles. Sensory.: intact to touch , pinprick , position and vibratory sensation.  Coordination: Rapid alternating movements normal in all extremities. Finger-to-nose and heel-to-shin performed accurately bilaterally. Gait and Station: Arises from chair without difficulty. Stance is normal. Gait demonstrates normal stride length and balance without use of assistive device. Mild difficulty with tandem walk and heel toe. Romberg negative.  Reflexes: 1+ and symmetric. Toes downgoing.      ASSESSMENT: Joshua Moran. is a 75 y.o. year old male presented with aphasia on 08/06/2019 with stroke work-up revealing left frontal lobe infarct embolic pattern secondary to known AF not on [facility] therefore Eliquis initiated. Vascular risk factors include HLD, OSA on CPAP and A. fib    PLAN:  1. CC: vertigo: a. Concern of possible oscillopsia (as discussed with Dr. Felecia Shelling) - recommend trial of baclofen 2.5mg  TID and referral placed to neurophthalmology Dr. Hassell Done. Also recommended evaluation by ENT to assess for inner ear or sinus conditions contributing to symptoms b. As symptoms are chronic present over the past few years, no indication for imaging at this present time as prior imaging completed 2021 c. Recent lab work by PCP including CBC and CMP satisfactory 2. Left frontal lobe stroke:  a. Recovered well without residual deficits  b. Continue Eliquis (apixaban) daily  and Crestor and Repatha.   c. Ensure close PCP and cardiology follow-up for aggressive  stroke risk factor management 3. HLD:  a. LDL goal <70.  Recent LDL 56 (10/08/2020). History of statin intolerance.   b. Continue Repatha and Crestor managed by cardiology  4. Atrial fibrillation:  a. S/p DCCV to NSR in 03/2020- NSR today  b. On Eliquis per cardiology with CHA2DS2-VASc score of at least 8 5. OSA on CPAP:  a. Reports ongoing compliance monitored/managed by cardiology    Follow up with Dr. Leonie Moran in 3 months for further evaluation or call earlier if needed  CC:  [place] provider: Dr. Wyatt Portela, Dwyane Luo, MD     Frann Rider, [facility]  [facility] [address] [place],  [address]  Phone [phone] Fax [phone] Note: This document was prepared with digital dictation and possible smart phrase technology. Any transcriptional errors that result from this process are unintentional.

## 2020-10-16 NOTE — Progress Notes (Signed)
I agree with the above plan 

## 2020-10-21 DIAGNOSIS — N62 Hypertrophy of breast: Secondary | ICD-10-CM | POA: Diagnosis not present

## 2020-10-21 DIAGNOSIS — N644 Mastodynia: Secondary | ICD-10-CM | POA: Diagnosis not present

## 2020-10-22 ENCOUNTER — Other Ambulatory Visit: Payer: Self-pay | Admitting: Home Modifications

## 2020-10-22 DIAGNOSIS — N644 Mastodynia: Secondary | ICD-10-CM

## 2020-10-26 ENCOUNTER — Ambulatory Visit (HOSPITAL_COMMUNITY)
Admission: RE | Admit: 2020-10-26 | Discharge: 2020-10-26 | Disposition: A | Discharge status: Home / Self Care | Payer: PPO | Source: Ambulatory Visit | Attending: Cardiology | Admitting: Cardiology

## 2020-10-26 ENCOUNTER — Other Ambulatory Visit: Payer: Self-pay

## 2020-10-26 ENCOUNTER — Ambulatory Visit (HOSPITAL_BASED_OUTPATIENT_CLINIC_OR_DEPARTMENT_OTHER): Payer: PPO

## 2020-10-26 DIAGNOSIS — I517 Cardiomegaly: Secondary | ICD-10-CM | POA: Diagnosis not present

## 2020-10-26 DIAGNOSIS — I428 Other cardiomyopathies: Secondary | ICD-10-CM | POA: Insufficient documentation

## 2020-10-26 DIAGNOSIS — G4733 Obstructive sleep apnea (adult) (pediatric): Secondary | ICD-10-CM | POA: Diagnosis not present

## 2020-10-26 DIAGNOSIS — R072 Precordial pain: Secondary | ICD-10-CM

## 2020-10-26 DIAGNOSIS — R0600 Dyspnea, unspecified: Secondary | ICD-10-CM | POA: Diagnosis not present

## 2020-10-26 DIAGNOSIS — I420 Dilated cardiomyopathy: Secondary | ICD-10-CM

## 2020-10-26 LAB — BASIC METABOLIC PANEL
Anion gap: 6 (ref 5–15)
BUN: 16 mg/dL (ref 8–23)
CO2: 24 mmol/L (ref 22–32)
Calcium: 8.8 mg/dL — ABNORMAL LOW (ref 8.9–10.3)
Chloride: 107 mmol/L (ref 98–111)
Creatinine, Ser: 1.17 mg/dL (ref 0.61–1.24)
GFR, Estimated: >60 mL/min (ref >60)
Glucose, Bld: 105 mg/dL — ABNORMAL HIGH (ref 70–99)
Potassium: 4.4 mmol/L (ref 3.5–5.1)
Sodium: 137 mmol/L (ref 135–145)

## 2020-10-26 LAB — ECHOCARDIOGRAM COMPLETE
Area-P 1/2: 3.99 cm2
S' Lateral: 4.6 cm

## 2020-10-26 MED ORDER — PERFLUTREN LIPID MICROSPHERE
1.0000 mL | INTRAVENOUS | Status: AC | PRN
  Administered 2020-10-26: 2 mL via INTRAVENOUS

## 2020-10-26 NOTE — Progress Notes (Signed)
Patient ID: Joshua Attar., male   DOB: 08-Oct-1945, 75 y.o.   MRN: [id number]  Echocardiogram 2D Echocardiogram has been performed.  Jennette Dubin 10/26/20

## 2020-10-27 ENCOUNTER — Telehealth: Payer: Self-pay | Admitting: Cardiology

## 2020-10-27 ENCOUNTER — Encounter (HOSPITAL_COMMUNITY): Payer: PPO | Admitting: Cardiology

## 2020-10-27 NOTE — Telephone Encounter (Signed)
The patient has been notified of the result and verbalized understanding.  All questions (if any) were answered. Antonieta Iba, RN 10/27/2020 12:11 PM

## 2020-10-27 NOTE — Telephone Encounter (Signed)
Joshua Moran is returning [name] call in regards to his Echo results. Please advise.

## 2020-10-30 ENCOUNTER — Encounter: Payer: Self-pay | Admitting: Adult Health

## 2020-11-03 NOTE — Telephone Encounter (Signed)
This encounter was created in error - please disregard.

## 2020-11-04 ENCOUNTER — Other Ambulatory Visit: Payer: PPO

## 2020-11-11 ENCOUNTER — Ambulatory Visit: Payer: PPO

## 2020-11-11 ENCOUNTER — Ambulatory Visit
Admission: RE | Admit: 2020-11-11 | Discharge: 2020-11-11 | Disposition: A | Discharge status: Home / Self Care | Payer: PPO | Source: Ambulatory Visit | Attending: Home Modifications | Admitting: Home Modifications

## 2020-11-11 ENCOUNTER — Other Ambulatory Visit: Payer: Self-pay

## 2020-11-11 DIAGNOSIS — R928 Other abnormal and inconclusive findings on diagnostic imaging of breast: Secondary | ICD-10-CM | POA: Diagnosis not present

## 2020-11-11 DIAGNOSIS — N644 Mastodynia: Secondary | ICD-10-CM

## 2020-11-12 ENCOUNTER — Ambulatory Visit: Payer: PPO | Admitting: Cardiology

## 2020-11-16 ENCOUNTER — Other Ambulatory Visit: Payer: Self-pay | Admitting: Home Modifications

## 2020-11-16 ENCOUNTER — Telehealth: Payer: Self-pay | Admitting: *Deleted

## 2020-11-16 NOTE — Telephone Encounter (Signed)
S/w Tiffany, surgery scheduler for Dr. Paulita Fujita. We discussed if colonoscopy was planned to be done before 12/11/20. Tiffany, states not scheduled yet at this time. We discussed if not able to be done before 7/8 procedure; pre op provider: If not he will need to wait until after he is cleared from EP to hold anticoagulation for the colonoscopy. Per Tiffany, Dr. Paulita Fujita is out of the office this week, though she will d/w MD one back in the office. Tiffany said she will call me back Monday 11/23/20 with input from Dr. Paulita Fujita.

## 2020-11-16 NOTE — Telephone Encounter (Signed)
   [facility] Pre-operative Risk Assessment    Patient Name: Joshua Moran.  DOB: March 23, 1946  MRN: [id number]   HEARTCARE STAFF: - Please ensure there is not already an duplicate clearance open for this procedure. - Under Visit Info/Reason for Call, type in Other and utilize the format Clearance MM/DD/YY or Clearance TBD. Do not use dashes or single digits. - If request is for dental extraction, please clarify the # of teeth to be extracted. - If the patient is currently at the dentist's office, call Pre-Op APP to address. If the patient is not currently in the dentist office, please route to the Pre-Op pool  Request for surgical clearance: LOOKS LIKE PT IS SCHEDULED FOR AV NODE ABLATION 12/11/20 WITH DR. Quentin Ore  What type of surgery is being performed? COLONOSCOPY (PERSONAL H/O COLON POLYPS)   When is this surgery scheduled? TBD   What type of clearance is required (medical clearance vs. Pharmacy clearance to hold med vs. Both)? BOTH  Are there any medications that need to be held prior to surgery and how long? [place] name and name of physician performing surgery? [facility]; DR. Paulita Fujita   What is the office phone number? [phone]   7.   What is the office fax number? [phone]  8.   Anesthesia type (None, local, MAC, general) ? PROPOFOL   Julaine Hua 11/16/2020, 12:11 PM  _________________________________________________________________   (provider comments below)

## 2020-11-16 NOTE — Telephone Encounter (Signed)
Can we confirm that the procedure will be performed prior to the patients ablation? If not he will need to wait until after he is cleared from EP to hold anticoagulation for the colonoscopy

## 2020-11-16 NOTE — Telephone Encounter (Signed)
Left verbal message for surgery scheduler to call the office to confirm if procedure being done be 12/11/20. Otherwise procedure will need to wait until after pt has had his ablation 12/11/20, see notes.

## 2020-11-19 ENCOUNTER — Other Ambulatory Visit (HOSPITAL_COMMUNITY): Payer: Self-pay | Admitting: Cardiology

## 2020-11-23 ENCOUNTER — Encounter (HOSPITAL_COMMUNITY): Payer: Self-pay

## 2020-11-24 NOTE — Telephone Encounter (Signed)
ADDENDUM: SEE PREVIOUS NOTES.  Kathyrn Drown D, NP 8 days ago   Can we confirm that the procedure will be performed prior to the patients ablation? If not he will need to wait until after he is cleared from EP to hold anticoagulation for the colonoscopy       I S/W TIFFANY FROM [facility] TODAY WHO STATES SHE CAN SQUEEZE THE PT IN FOR PROCEDURE 12/09/20 @ 4:30. TIFFANY WANTS TO MAKE SURE SURE OK FOR PT TO HAVE COLONOSCOPY 7/6 AS HE IS SCHEDULED FOR AN ABLATION ON 12/11/20. I ASSURED TIFFANY, I WILL SEND THESE NOTES FROM TODAY TO PRE OP PROVIDER FOR FURTHER ADVICE, IF CLEARED TO PROCEED WE WILL THEN FAX CLEARANCE NOTES OVER. TIFFANY, THANKED ME FOR THE HELP. I ASSURED TIFFANY, THAT I WILL CALL HER TOMORROW WITH AN UPDATE.

## 2020-11-25 ENCOUNTER — Telehealth: Payer: Self-pay | Admitting: Physician Assistant

## 2020-11-25 NOTE — Telephone Encounter (Signed)
Patient with diagnosis of afib on Eliquis for anticoagulation.    Procedure: colonoscopy Date of procedure: 12/09/20  CHA2DS2-VASc Score = 6  This indicates a 9.7% annual risk of stroke. The patient's score is based upon: CHF History: Yes HTN History: No Diabetes History: No Stroke History: Yes Vascular Disease History: Yes Age Score: 2 Gender Score: 0   CrCl 65mL/min using adjusted body weight Platelet count 224K  Pt pending AV node ablation on 7/8. Confirmed with Dr Lovena Le that there are no issues with pt holding anticoagulation for colonoscopy ahead of AV node ablation.  Per office protocol, patient can hold Eliquis for 1 day prior to procedure. Recommend resuming as soon as safely possible due to elevated cardiovascular risk.

## 2020-11-25 NOTE — Telephone Encounter (Addendum)
   Name: Joshua Moran.  DOB: 04-21-1946  MRN: [id number]   Primary Cardiologist: [name], MD  Chart reviewed as part of pre-operative protocol coverage. Patient was contacted 11/25/2020 in reference to pre-operative risk assessment for pending surgery as outlined below.  Joshua Moran. was last seen on 10/13/20 by Dr. Aundra Dubin.  Since that day, Joshua Moran. has done well.  He is able to complete 4.0 METS without angina.   Per our clinical pharmacist,  Patient with diagnosis of afib on Eliquis for anticoagulation.     Procedure: colonoscopy Date of procedure: 12/09/20   CHA2DS2-VASc Score = 6  This indicates a 9.7% annual risk of stroke. The patient's score is based upon: CHF History: Yes HTN History: No Diabetes History: No Stroke History: Yes Vascular Disease History: Yes Age Score: 2 Gender Score: 0    CrCl 73mL/min using adjusted body weight Platelet count 224K   Pt pending AV node ablation on 7/8. Confirmed with Dr Lovena Le that there are no issues with pt holding anticoagulation for colonoscopy ahead of AV node ablation.   Per office protocol, patient can hold Eliquis for 1 day prior to procedure. Recommend resuming as soon as safely possible due to elevated cardiovascular risk.      Initially, patient was going to have colonoscopy 7/6 prior to ablation. He now wants to hold off on colonoscopy, states "it will be too much too close together." I reached out to Dr. Quentin Ore for timing interruption of Joshua Moran after ablation and device placement.   Per Dr. Quentin Ore: "I'd want him to wait about 6-8 weeks after a device implant to have a colonoscopy to help reduce the infection risk."   Therefore, based on ACC/AHA guidelines, the patient would be at acceptable risk for the planned procedure without further cardiovascular testing.   The patient was advised that if he develops new symptoms prior to surgery to contact our office to arrange for a follow-up visit, and he verbalized  understanding.  I will route this recommendation to the requesting party via Epic fax function and remove from pre-op pool. Please call with questions.  Tami Lin Ziomara Birenbaum, PA 11/25/2020, 11:19 AM

## 2020-11-25 NOTE — Telephone Encounter (Signed)
Can you please comment on holding anticoagulation prior to ablation on 7/8?

## 2020-11-26 DIAGNOSIS — Z0000 Encounter for general adult medical examination without abnormal findings: Secondary | ICD-10-CM | POA: Diagnosis not present

## 2020-11-26 DIAGNOSIS — G4733 Obstructive sleep apnea (adult) (pediatric): Secondary | ICD-10-CM | POA: Diagnosis not present

## 2020-11-30 DIAGNOSIS — I639 Cerebral infarction, unspecified: Secondary | ICD-10-CM | POA: Diagnosis not present

## 2020-11-30 DIAGNOSIS — I4891 Unspecified atrial fibrillation: Secondary | ICD-10-CM | POA: Diagnosis not present

## 2020-11-30 DIAGNOSIS — E7800 Pure hypercholesterolemia, unspecified: Secondary | ICD-10-CM | POA: Diagnosis not present

## 2020-11-30 DIAGNOSIS — M179 Osteoarthritis of knee, unspecified: Secondary | ICD-10-CM | POA: Diagnosis not present

## 2020-11-30 DIAGNOSIS — D649 Anemia, unspecified: Secondary | ICD-10-CM | POA: Diagnosis not present

## 2020-11-30 DIAGNOSIS — I5022 Chronic systolic (congestive) heart failure: Secondary | ICD-10-CM | POA: Diagnosis not present

## 2020-11-30 DIAGNOSIS — N401 Enlarged prostate with lower urinary tract symptoms: Secondary | ICD-10-CM | POA: Diagnosis not present

## 2020-12-01 NOTE — Telephone Encounter (Signed)
Per Doreene Adas, PAC pre op provider today: She sent an updated letter this morning with Dr. Mardene Speak recommendations to wait

## 2020-12-09 ENCOUNTER — Other Ambulatory Visit: Payer: Self-pay

## 2020-12-09 ENCOUNTER — Other Ambulatory Visit (HOSPITAL_COMMUNITY): Payer: PPO

## 2020-12-09 ENCOUNTER — Other Ambulatory Visit: Payer: PPO

## 2020-12-09 DIAGNOSIS — I5022 Chronic systolic (congestive) heart failure: Secondary | ICD-10-CM

## 2020-12-09 DIAGNOSIS — Z79899 Other long term (current) drug therapy: Secondary | ICD-10-CM | POA: Diagnosis not present

## 2020-12-09 DIAGNOSIS — I5042 Chronic combined systolic (congestive) and diastolic (congestive) heart failure: Secondary | ICD-10-CM

## 2020-12-09 DIAGNOSIS — I4821 Permanent atrial fibrillation: Secondary | ICD-10-CM

## 2020-12-09 DIAGNOSIS — I447 Left bundle-branch block, unspecified: Secondary | ICD-10-CM

## 2020-12-09 DIAGNOSIS — I428 Other cardiomyopathies: Secondary | ICD-10-CM | POA: Diagnosis not present

## 2020-12-09 LAB — CBC WITH DIFFERENTIAL/PLATELET
Basophils Absolute: 0 10*3/uL (ref 0.0–0.2)
Basos: 1 %
EOS (ABSOLUTE): 0.3 10*3/uL (ref 0.0–0.4)
Eos: 5 %
Hematocrit: 41 % (ref 37.5–51.0)
Hemoglobin: 13.3 g/dL (ref 13.0–17.7)
Immature Grans (Abs): 0 10*3/uL (ref 0.0–0.1)
Immature Granulocytes: 0 %
Lymphocytes Absolute: 0.9 10*3/uL (ref 0.7–3.1)
Lymphs: 19 %
MCH: 30.5 pg (ref 26.6–33.0)
MCHC: 32.4 g/dL (ref 31.5–35.7)
MCV: 94 fL (ref 79–97)
Monocytes Absolute: 0.5 10*3/uL (ref 0.1–0.9)
Monocytes: 9 %
Neutrophils Absolute: 3.2 10*3/uL (ref 1.4–7.0)
Neutrophils: 66 %
Platelets: 214 10*3/uL (ref 150–450)
RBC: 4.36 x10E6/uL (ref 4.14–5.80)
RDW: 12.7 % (ref 11.6–15.4)
WBC: 4.9 10*3/uL (ref 3.4–10.8)

## 2020-12-09 LAB — COMPREHENSIVE METABOLIC PANEL
ALT: 17 IU/L (ref 0–44)
AST: 16 IU/L (ref 0–40)
Albumin/Globulin Ratio: 1.5 (ref 1.2–2.2)
Albumin: 3.6 g/dL — ABNORMAL LOW (ref 3.7–4.7)
Alkaline Phosphatase: 68 IU/L (ref 44–121)
BUN/Creatinine Ratio: 12 (ref 10–24)
BUN: 12 mg/dL (ref 8–27)
Bilirubin Total: 0.5 mg/dL (ref 0.0–1.2)
CO2: 20 mmol/L (ref 20–29)
Calcium: 8.8 mg/dL (ref 8.6–10.2)
Chloride: 106 mmol/L (ref 96–106)
Creatinine, Ser: 1 mg/dL (ref 0.76–1.27)
Globulin, Total: 2.4 g/dL (ref 1.5–4.5)
Glucose: 98 mg/dL (ref 65–99)
Potassium: 4.5 mmol/L (ref 3.5–5.2)
Sodium: 139 mmol/L (ref 134–144)
Total Protein: 6 g/dL (ref 6.0–8.5)
eGFR: 78 mL/min/{1.73_m2} (ref >59)

## 2020-12-09 LAB — TSH: TSH: 4.14 u[IU]/mL (ref 0.450–4.500)

## 2020-12-10 NOTE — Pre-Procedure Instructions (Signed)
Instructed patient on the following items: Arrival time 0930 Nothing to eat or drink after midnight No meds AM of procedure Responsible person to drive you home and stay with you for 24 hrs Wash with special soap night before and morning of procedure If on anti-coagulant drug instructions Eliquis- last dose 7/6  AM.

## 2020-12-11 ENCOUNTER — Encounter (HOSPITAL_COMMUNITY): Payer: Self-pay | Admitting: Cardiology

## 2020-12-11 ENCOUNTER — Ambulatory Visit (HOSPITAL_COMMUNITY)
Admission: RE | Disposition: A | Discharge status: Home / Self Care | Payer: PPO | Source: Home / Self Care | Attending: Cardiology

## 2020-12-11 ENCOUNTER — Observation Stay (HOSPITAL_COMMUNITY)
Admission: RE | Admit: 2020-12-11 | Discharge: 2020-12-12 | Disposition: A | Discharge status: Home / Self Care | Payer: PPO | Attending: Cardiology | Admitting: Cardiology

## 2020-12-11 ENCOUNTER — Other Ambulatory Visit: Payer: Self-pay

## 2020-12-11 DIAGNOSIS — I4821 Permanent atrial fibrillation: Principal | ICD-10-CM | POA: Insufficient documentation

## 2020-12-11 DIAGNOSIS — E669 Obesity, unspecified: Secondary | ICD-10-CM | POA: Diagnosis not present

## 2020-12-11 DIAGNOSIS — I420 Dilated cardiomyopathy: Secondary | ICD-10-CM | POA: Diagnosis present

## 2020-12-11 DIAGNOSIS — I5042 Chronic combined systolic (congestive) and diastolic (congestive) heart failure: Secondary | ICD-10-CM | POA: Insufficient documentation

## 2020-12-11 DIAGNOSIS — I447 Left bundle-branch block, unspecified: Secondary | ICD-10-CM | POA: Insufficient documentation

## 2020-12-11 DIAGNOSIS — I428 Other cardiomyopathies: Secondary | ICD-10-CM | POA: Insufficient documentation

## 2020-12-11 DIAGNOSIS — Z950 Presence of cardiac pacemaker: Secondary | ICD-10-CM

## 2020-12-11 DIAGNOSIS — Z888 Allergy status to other drugs, medicaments and biological substances status: Secondary | ICD-10-CM | POA: Insufficient documentation

## 2020-12-11 DIAGNOSIS — Z96653 Presence of artificial knee joint, bilateral: Secondary | ICD-10-CM | POA: Insufficient documentation

## 2020-12-11 DIAGNOSIS — I4891 Unspecified atrial fibrillation: Secondary | ICD-10-CM | POA: Diagnosis present

## 2020-12-11 DIAGNOSIS — Z880 Allergy status to penicillin: Secondary | ICD-10-CM | POA: Insufficient documentation

## 2020-12-11 DIAGNOSIS — Z881 Allergy status to other antibiotic agents status: Secondary | ICD-10-CM | POA: Insufficient documentation

## 2020-12-11 DIAGNOSIS — Z6839 Body mass index (BMI) 39.0-39.9, adult: Secondary | ICD-10-CM | POA: Insufficient documentation

## 2020-12-11 DIAGNOSIS — I429 Cardiomyopathy, unspecified: Secondary | ICD-10-CM | POA: Diagnosis not present

## 2020-12-11 DIAGNOSIS — E785 Hyperlipidemia, unspecified: Secondary | ICD-10-CM | POA: Diagnosis present

## 2020-12-11 HISTORY — PX: BIV PACEMAKER INSERTION CRT-P: EP1199

## 2020-12-11 HISTORY — PX: AV NODE ABLATION: EP1193

## 2020-12-11 SURGERY — AV NODE ABLATION

## 2020-12-11 MED ORDER — FENTANYL CITRATE (PF) 100 MCG/2ML IJ SOLN
INTRAMUSCULAR | Status: DC | PRN
  Administered 2020-12-11: 25 ug via INTRAVENOUS
  Administered 2020-12-11: 12.5 ug via INTRAVENOUS
  Administered 2020-12-11: 25 ug via INTRAVENOUS
  Administered 2020-12-11 (×4): 12.5 ug via INTRAVENOUS

## 2020-12-11 MED ORDER — MIDAZOLAM HCL 5 MG/5ML IJ SOLN
INTRAMUSCULAR | Status: AC
  Filled 2020-12-11: qty 5

## 2020-12-11 MED ORDER — SODIUM CHLORIDE 0.9 % IV SOLN: INTRAVENOUS | Status: DC

## 2020-12-11 MED ORDER — HEPARIN (PORCINE) IN NACL 1000-0.9 UT/500ML-% IV SOLN
INTRAVENOUS | Status: DC | PRN
  Administered 2020-12-11 (×2): 500 mL

## 2020-12-11 MED ORDER — FENTANYL CITRATE (PF) 100 MCG/2ML IJ SOLN
INTRAMUSCULAR | Status: AC
  Filled 2020-12-11: qty 2

## 2020-12-11 MED ORDER — BRIMONIDINE TARTRATE 0.15 % OP SOLN
1.0000 [drp] | Freq: Every day | OPHTHALMIC | Status: DC
  Administered 2020-12-11: 1 [drp] via OPHTHALMIC
  Filled 2020-12-11: qty 5

## 2020-12-11 MED ORDER — TAMSULOSIN HCL 0.4 MG PO CAPS
0.4000 mg | ORAL_CAPSULE | Freq: Every day | ORAL | Status: DC
  Administered 2020-12-11: 0.4 mg via ORAL
  Filled 2020-12-11: qty 1

## 2020-12-11 MED ORDER — ONDANSETRON HCL 4 MG/2ML IJ SOLN: 4.0000 mg | Freq: Four times a day (QID) | INTRAMUSCULAR | Status: DC | PRN

## 2020-12-11 MED ORDER — CLINDAMYCIN PHOSPHATE 900 MG/50ML IV SOLN
900.0000 mg | Freq: Once | INTRAVENOUS | Status: AC
  Administered 2020-12-11: 900 mg via INTRAVENOUS
  Filled 2020-12-11: qty 50

## 2020-12-11 MED ORDER — IOHEXOL 350 MG/ML SOLN
INTRAVENOUS | Status: DC | PRN
  Administered 2020-12-11: 26 mL

## 2020-12-11 MED ORDER — HEPARIN SODIUM (PORCINE) 1000 UNIT/ML IJ SOLN
INTRAMUSCULAR | Status: AC
  Filled 2020-12-11: qty 1

## 2020-12-11 MED ORDER — SPIRONOLACTONE 25 MG PO TABS
25.0000 mg | ORAL_TABLET | Freq: Every day | ORAL | Status: DC
  Filled 2020-12-11: qty 1

## 2020-12-11 MED ORDER — LIDOCAINE HCL (PF) 1 % IJ SOLN
INTRAMUSCULAR | Status: DC | PRN
  Administered 2020-12-11: 45 mL

## 2020-12-11 MED ORDER — BUPIVACAINE HCL (PF) 0.25 % IJ SOLN
INTRAMUSCULAR | Status: DC | PRN
  Administered 2020-12-11: 20 mL

## 2020-12-11 MED ORDER — LOSARTAN POTASSIUM 25 MG PO TABS: 12.5000 mg | ORAL_TABLET | Freq: Every day | ORAL | Status: DC

## 2020-12-11 MED ORDER — SODIUM CHLORIDE 0.9 % IV SOLN
INTRAVENOUS | Status: AC
  Filled 2020-12-11: qty 2

## 2020-12-11 MED ORDER — ACETAMINOPHEN 325 MG PO TABS
325.0000 mg | ORAL_TABLET | ORAL | Status: DC | PRN
Start: 2020-12-11 — End: 2020-12-12
  Administered 2020-12-11: 650 mg via ORAL
  Filled 2020-12-11 (×2): qty 2

## 2020-12-11 MED ORDER — SODIUM CHLORIDE 0.9 % IV SOLN
80.0000 mg | INTRAVENOUS | Status: AC
  Administered 2020-12-11: 80 mg

## 2020-12-11 MED ORDER — POVIDONE-IODINE 10 % EX SWAB: 2.0000 "application " | Freq: Once | CUTANEOUS | Status: DC

## 2020-12-11 MED ORDER — LIDOCAINE HCL (PF) 1 % IJ SOLN
INTRAMUSCULAR | Status: AC
  Filled 2020-12-11: qty 60

## 2020-12-11 MED ORDER — CHLORHEXIDINE GLUCONATE 4 % EX LIQD: 4.0000 "application " | Freq: Once | CUTANEOUS | Status: DC

## 2020-12-11 MED ORDER — MIDAZOLAM HCL 5 MG/5ML IJ SOLN
INTRAMUSCULAR | Status: DC | PRN
  Administered 2020-12-11 (×3): 1 mg via INTRAVENOUS
  Administered 2020-12-11 (×4): 0.5 mg via INTRAVENOUS

## 2020-12-11 MED ORDER — FUROSEMIDE 20 MG PO TABS
20.0000 mg | ORAL_TABLET | Freq: Every day | ORAL | Status: DC
  Filled 2020-12-11: qty 1

## 2020-12-11 SURGICAL SUPPLY — 28 items
BALLN COR SINUS VENO 6FR 80 (BALLOONS) ×2
BALLOON COR SINUS VENO 6FR 80 (BALLOONS) ×1 IMPLANT
CABLE SURGICAL S-101-97-12 (CABLE) ×2 IMPLANT
CATH ATTAIN COM SURV 6250V-EH (CATHETERS) ×2 IMPLANT
CATH ATTAIN SEL SURV 6248V-90 (CATHETERS) ×2 IMPLANT
CATH SMTCH THERMOCOOL SF FJ (CATHETERS) ×2 IMPLANT
CLOSURE PERCLOSE PROSTYLE (VASCULAR PRODUCTS) ×2 IMPLANT
KIT ESSENTIALS PG (KITS) ×2 IMPLANT
LEAD QUARTET 1458Q-86CM (Lead) ×2 IMPLANT
LEAD TENDRIL MRI 58CM LPA1200M (Lead) ×2 IMPLANT
MAT PREVALON FULL STRYKER (MISCELLANEOUS) ×2 IMPLANT
PACEMAKER QUDR ALLR CRT PM3562 (Pacemaker) ×1 IMPLANT
PACK EP LATEX FREE (CUSTOM PROCEDURE TRAY) ×2
PACK EP LF (CUSTOM PROCEDURE TRAY) ×1 IMPLANT
PAD PRO RADIOLUCENT 2001M-C (PAD) ×2 IMPLANT
PATCH CARTO3 (PAD) ×2 IMPLANT
PMKR QUADRA ALLURE CRT PM3562 (Pacemaker) ×2 IMPLANT
SHEATH 7FR PRELUDE SNAP 13 (SHEATH) IMPLANT
SHEATH 8FR PRELUDE SNAP 13 (SHEATH) ×2 IMPLANT
SHEATH 9.5FR PRELUDE SNAP 13 (SHEATH) ×2 IMPLANT
SHEATH PINNACLE 8F 10CM (SHEATH) ×2 IMPLANT
SHEATH PROBE COVER 6X72 (BAG) ×2 IMPLANT
SLITTER 6232ADJ (MISCELLANEOUS) ×2 IMPLANT
TRAY PACEMAKER INSERTION (PACKS) ×2 IMPLANT
TUBING SMART ABLATE COOLFLOW (TUBING) ×2 IMPLANT
WIRE ACUITY WHISPER EDS 4648 (WIRE) ×6 IMPLANT
WIRE HI TORQ VERSACORE-J 145CM (WIRE) ×2 IMPLANT
WIRE MAILMAN 182CM (WIRE) ×2 IMPLANT

## 2020-12-11 NOTE — H&P (Signed)
Virtual Visit via Telephone Note    This visit type was conducted due to national recommendations for restrictions regarding the COVID-19 Pandemic (e.g. social distancing) in an effort to limit this patient's exposure and mitigate transmission in our community.  Due to his co-morbid illnesses, this patient is at least at moderate risk for complications without adequate follow up.  This format is felt to be most appropriate for this patient at this time.  The patient did not have access to video technology/had technical difficulties with video requiring transitioning to audio format only (telephone).  All issues noted in this document were discussed and addressed.  No physical exam could be performed with this format.  Please refer to the patient's chart for his  consent to telehealth for [facility]      Date:  10/07/2020    ID:  Joshua Moran., DOB 02-Jan-1946, MRN [id number] The patient was identified using 2 identifiers.   Patient Location: Home Provider Location: Office/Clinic     PCP:  Lawerance Cruel, MD              [facility] Providers Cardiologist:  [name], MD Electrophysiologist:  Vickie Epley, MD {   Chief Complaint: Permanent atrial fibrillation   History of Present Illness:     Joshua Reali. is a 75 y.o. male with permanent atrial fibrillation who presents via televisit to discuss proceeding with CRT-P implant and AV junction ablation.  The patient has failed amiodarone and continues to have symptoms related to his atrial fibrillation.  At her previous appointment on September 29, 2020 we discussed CRT-P implant and AV junction ablation but the patient would not dispense them time thinking about it.  He would now like to proceed with implant.  He has a vacation scheduled for June and would like to think about doing the procedure after he returns from his vacation.         Past Medical History:  Diagnosis Date   Abnormal screening CT of chest       coronary calcium in left main and RCA   Anemia     Arthritis     CHF (congestive heart failure) (HCC)     Complication of anesthesia      Pt reports difficult urinating   Coronary artery calcification seen on CAT scan      no ischemic on nuclear stress test 09/2019   DCM (dilated cardiomyopathy) ([place])      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  Cardiac MRI 2021 with EF 39% and moderate RV dysfunction and non-compaction   Dyspnea      with some activity   GERD (gastroesophageal reflux disease)      occasional   Glaucoma     Hyperlipidemia      statin intolerant at doses > Crestor 5mg  3 times weekly   OSA on CPAP     PAF (paroxysmal atrial fibrillation) ([place])      CHADS2VASC score 2 on apixaban         Past Surgical History:  Procedure Laterality Date   CARDIOVERSION N/A 04/01/2020    Procedure: CARDIOVERSION;  Surgeon: Skeet Latch, MD;  Location: [facility];  Service: Cardiovascular;  Laterality: N/A;   CARDIOVERSION N/A 09/08/2020    Procedure: CARDIOVERSION;  Surgeon: Pixie Casino, MD;  Location: [facility];  Service: Cardiovascular;  Laterality: N/A;   EYE SURGERY        bilateral cataracts   KNEE ARTHROSCOPY W/ DEBRIDEMENT  left   RETINAL DETACHMENT SURGERY Bilateral     RIGHT/LEFT HEART CATH AND CORONARY ANGIOGRAPHY N/A 05/06/2020    Procedure: RIGHT/LEFT HEART CATH AND CORONARY ANGIOGRAPHY;  Surgeon: Larey Dresser, MD;  Location: [facility];  Service: Cardiovascular;  Laterality: N/A;   TOTAL KNEE ARTHROPLASTY Left 01/01/2018    Procedure: LEFT TOTAL KNEE ARTHROPLASTY;  Surgeon: Gaynelle Arabian, MD;  Location: [facility];  Service: Orthopedics;  Laterality: Left;  50 mins   TOTAL KNEE ARTHROPLASTY Right 06/25/2018    Procedure: TOTAL KNEE ARTHROPLASTY;  Surgeon: Gaynelle Arabian, MD;  Location: [facility];  Service: Orthopedics;  Laterality: Right;  69min   VASECTOMY          Active Medications  No outpatient medications have been marked as taking for the  10/07/20 encounter (Telemedicine) with Vickie Epley, MD.        Allergies:   Xarelto [rivaroxaban], Empagliflozin, Penicillins, Statins, and Vancomycin    Social History         Tobacco Use   Smoking status: Never Smoker   Smokeless tobacco: Never Used  Vaping Use   Vaping Use: Never used  Substance Use Topics   Alcohol use: Yes      Alcohol/week: 1.0 standard drink      Types: 1 Cans of beer per week      Comment: Rare   Drug use: No      Family Hx: The patient's family history includes Lung cancer in his mother.   ROS:   Please see the history of present illness.      All other systems reviewed and are negative.     Labs/Other Tests and Data Reviewed:    EKG:  No ECG reviewed.   Recent Labs: 09/08/2020: ALT 18; BUN 28; Creatinine, Ser 1.10; Hemoglobin 14.6; Platelets 224; Potassium 7.7; Sodium 139; TSH 3.638    Recent Lipid Panel Labs (Brief)       Lab Results  Component Value Date/Time    CHOL 134 05/20/2020 04:32 PM    CHOL 127 11/25/2019 02:01 PM    TRIG 117 05/20/2020 04:32 PM    HDL 45 05/20/2020 04:32 PM    HDL 49 11/25/2019 02:01 PM    CHOLHDL 3.0 05/20/2020 04:32 PM    LDLCALC 66 05/20/2020 04:32 PM    LDLCALC 60 11/25/2019 02:01 PM    LDLDIRECT 62 11/25/2019 02:01 PM           Wt Readings from Last 3 Encounters:  09/29/20 297 lb (134.7 kg)  09/29/20 296 lb (134.3 kg)  09/25/20 296 lb (134.3 kg)        Objective:    Vital Signs:  There were no vitals taken for this visit.      ASSESSMENT & PLAN:     Chronic combined systolic and diastolic heart failure with a left bundle branch block NYHA class II-III.  Ejection fraction 45%.  Nonischemic.   Continue current medications.   2.  Permanent atrial fibrillation Symptomatic.  Failed amiodarone and cardioversion.  Plan to proceed with CRT-P implant and AV junction ablation.  I discussed the procedures in detail during her last visit and again today including the risks, expected recovery  time and he wishes to proceed.   Risks, benefits, and alternatives to CRT-D and AVJ ablation were discussed in detail today.  The patient understands that risks include but are not limited to bleeding, infection, pneumothorax, perforation, tamponade, vascular damage, renal failure, MI, stroke, death, inappropriate shocks and  lead dislodgement and wishes to proceed.  We will therefore schedule the procedure at the next available time.   We will plan for him to hold his Eliquis for 2 days prior to the procedure.   3.  Left bundle-branch block CRT-P as above.   4.  Obesity     ---------------------------------------------------  I have seen, examined the patient, and reviewed the above assessment and plan.    Plan for CRT-P and AVJ Ablation.   Vickie Epley, MD 12/11/2020 10:09 AM

## 2020-12-11 NOTE — Discharge Instructions (Addendum)
    Supplemental Discharge Instructions for  Pacemaker/Defibrillator Patients   Activity No heavy lifting or vigorous activity with your left/right arm for 6 to 8 weeks.  Do not raise your left/right arm above your head for one week.  Gradually raise your affected arm as drawn below.               12/16/20                   12/17/20                    12/18/20                  12/19/20 __  NO DRIVING until cleared to at your wound check visit.  WOUND CARE Keep the wound area clean and dry.  Do not get this area wet , no showers for one week; you may shower on  12/19/20   . The tape/steri-strips on your wound will fall off; do not pull them off.  No bandage is needed on the site.  DO  NOT apply any creams, oils, or ointments to the wound area. If you notice any drainage or discharge from the wound, any swelling or bruising at the site, or you develop a fever > 101? F after you are discharged home, call the office at once.  Special Instructions You are still able to use cellular telephones; use the ear opposite the side where you have your pacemaker/defibrillator.  Avoid carrying your cellular phone near your device. When traveling through airports, show security personnel your identification card to avoid being screened in the metal detectors.  Ask the security personnel to use the hand wand. Avoid arc welding equipment, MRI testing (magnetic resonance imaging), TENS units (transcutaneous nerve stimulators).  Call the office for questions about other devices. Avoid electrical appliances that are in poor condition or are not properly grounded. Microwave ovens are safe to be near or to operate.

## 2020-12-12 ENCOUNTER — Ambulatory Visit (HOSPITAL_COMMUNITY): Payer: PPO

## 2020-12-12 DIAGNOSIS — Z880 Allergy status to penicillin: Secondary | ICD-10-CM | POA: Diagnosis not present

## 2020-12-12 DIAGNOSIS — Z950 Presence of cardiac pacemaker: Secondary | ICD-10-CM | POA: Diagnosis not present

## 2020-12-12 DIAGNOSIS — Z888 Allergy status to other drugs, medicaments and biological substances status: Secondary | ICD-10-CM | POA: Diagnosis not present

## 2020-12-12 DIAGNOSIS — I4821 Permanent atrial fibrillation: Secondary | ICD-10-CM

## 2020-12-12 DIAGNOSIS — Z881 Allergy status to other antibiotic agents status: Secondary | ICD-10-CM | POA: Diagnosis not present

## 2020-12-12 DIAGNOSIS — Z6839 Body mass index (BMI) 39.0-39.9, adult: Secondary | ICD-10-CM | POA: Diagnosis not present

## 2020-12-12 DIAGNOSIS — I428 Other cardiomyopathies: Secondary | ICD-10-CM | POA: Diagnosis not present

## 2020-12-12 DIAGNOSIS — E669 Obesity, unspecified: Secondary | ICD-10-CM | POA: Diagnosis not present

## 2020-12-12 DIAGNOSIS — I5042 Chronic combined systolic (congestive) and diastolic (congestive) heart failure: Secondary | ICD-10-CM | POA: Diagnosis not present

## 2020-12-12 DIAGNOSIS — Z96653 Presence of artificial knee joint, bilateral: Secondary | ICD-10-CM | POA: Diagnosis not present

## 2020-12-12 DIAGNOSIS — I447 Left bundle-branch block, unspecified: Secondary | ICD-10-CM | POA: Diagnosis not present

## 2020-12-12 MED ORDER — LOSARTAN POTASSIUM 25 MG PO TABS: 12.5000 mg | ORAL_TABLET | Freq: Every day | ORAL | 2 refills | Status: DC

## 2020-12-12 NOTE — Progress Notes (Signed)
Patient Name: Joshua Moran.      SUBJECTIVE: admiited for CRT-P and AV ablation for drug refractory Afib in setting of NICM/CAD and CHF class 3  Past Medical History:  Diagnosis Date   Abnormal screening CT of chest    coronary calcium in left main and RCA   Anemia    Arthritis    CHF (congestive heart failure) (HCC)    Complication of anesthesia    Pt reports difficult urinating   Coronary artery calcification seen on CAT scan    no ischemic on nuclear stress test 09/2019   DCM (dilated cardiomyopathy) ([place])    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  Cardiac MRI 2021 with EF 39% and moderate RV dysfunction and non-compaction   Dyspnea    with some activity   GERD (gastroesophageal reflux disease)    occasional   Glaucoma    Hyperlipidemia    statin intolerant at doses > Crestor 5mg  3 times weekly   OSA on CPAP    PAF (paroxysmal atrial fibrillation) ([place])    CHADS2VASC score 2 on apixaban    Scheduled Meds:  Scheduled Meds:  brimonidine  1 drop Right Eye QHS   furosemide  20 mg Oral Daily   losartan  12.5 mg Oral QHS   spironolactone  25 mg Oral Daily   tamsulosin  0.4 mg Oral QHS   Continuous Infusions: acetaminophen, ondansetron (ZOFRAN) IV    PHYSICAL EXAM Vitals:   12/11/20 1830 12/11/20 2042 12/12/20 0010 12/12/20 0434  BP: 99/72 124/70 114/81 129/82  Pulse: 91 91 91 90  Resp: 15 14 16 17   Temp: 97.6 F (36.4 C) 97.8 F (36.6 C) 98.1 F (36.7 C) 98.3 F (36.8 C)  TempSrc: Oral Oral Oral Oral  SpO2: 100% 97% 95% 94%  Weight:      Height:       Well developed and nourished in no acute distress HENT normal Neck supple with JVP-  flat  Clear Pocket without ` hematoma, swelling or tenderness  Regular rate and rhythm, no murmurs or gallops Abd-soft with active BS No Clubbing cyanosis edema Skin-warm and dry A & Oriented  Grossly normal sensory and motor function    TELEMETRY: Reviewed personnally pt in Vpacing :  ECG personally  reviewedUpight QRS lead V1 and neg QRS lead 1   Intake/Output Summary (Last 24 hours) at 12/12/2020 0930 Last data filed at 12/12/2020 0455 Gross per 24 hour  Intake --  Output 700 ml  Net -700 ml    LABS: Basic Metabolic Panel: Recent Labs  Lab 12/09/20 1010  NA 139  K 4.5  CL 106  CO2 20  GLUCOSE 98  BUN 12  CREATININE 1.00  CALCIUM 8.8   Cardiac Enzymes: No results for input(s): CKTOTAL, CKMB, CKMBINDEX, TROPONINI in the last 72 hours. CBC: Recent Labs  Lab 12/09/20 1010  WBC 4.9  NEUTROABS 3.2  HGB 13.3  HCT 41.0  MCV 94  PLT 214   PROTIME: No results for input(s): LABPROT, INR in the last 72 hours. Liver Function Tests: Recent Labs    12/09/20 1010  AST 16  ALT 17  ALKPHOS 68  BILITOT 0.5  PROT 6.0  ALBUMIN 3.6*   No results for input(s): LIPASE, AMYLASE in the last 72 hours. BNP: BNP (last 3 results) Recent Labs    10/13/20 0948  BNP 100.3*     Device Interrogation:device function normal  CXR  can not see LV lead  on lateral view but appears tobe deployed in a middle/posterior cardiac vein near theapex in the AP projection No ptx   ASSESSMENT AND PLAN:  Active Problems:   Atrial fibrillation ([place]) Cardiomyopathy- NICM/CAD CHF chronic systolic Hypotension precluding uptitration of Guideline directed medical therapy    Signed, Virl Axe MD  12/12/2020

## 2020-12-12 NOTE — Discharge Summary (Signed)
Discharge Summary    Patient ID: Joshua Moran. MRN: [id number]; DOB: November 27, 1945  Admit date: 12/11/2020 Discharge date: 12/12/2020  PCP:  Lawerance Cruel, MD   [place] Providers Cardiologist:  [name], MD  Electrophysiologist:  Vickie Epley, MD      Discharge Diagnoses    Principal Problem:   Atrial fibrillation [facility]) Active Problems:   Dyslipidemia, goal LDL below 100   DCM (dilated cardiomyopathy) ([place])  Diagnostic Studies/Procedures    AV node ablation/CRT-P implant7/8/22:  INTRODUCTION: Joshua Moran. is a 75 y.o. male with a history of non-ischemic cardiomyopathy, EF 45% and a baseline ECG showing intermittent left bundle branch block and atrial fibrillation who presented for CRT-P implant and AV junction ablation.   DESCRIPTION OF PROCEDURE: Informed written consent was obtained, and the patient was brought to the electrophysiology lab in a fasting state. The patient was sedated using fentanyl and versed (details can be found in the anaesthesia record). The patients left chest and groins were prepped and draped in the usual sterile fashion by the EP lab staff. The skin overlying the left deltopectoral region was infiltrated with lidocaine for local analgesia. An incision was made over the left deltopectoral region with careful attention paid to hemostasis. A pacemaker pocket was fashioned immediately superficial to the pectoral fascia. Electrocautery was required to assure hemostasis.    Lead Placement:  Using ultrasound guidance, the left axillary vein was cannulated x 2. Through the left axillary vein, a StJ Tendril MRI right ventricular lead (model [id number], serial [id number]) was positioned on the right ventricular apical septum. It was found to have excellent pacing (0.6V @ 0.91m) and sensing (8.968m thresholds and was secured to the pectoral fascia. The impedance was 755 Ohms.   Next, a Medtronic extended hook CS sheath and Wholley wire were used to  cannulate the CS. A coronary sinus nonselective venogram confirmed cannulation of the CS. The nonselective venogram demonstrated a large caliber lateral and posterolateral CS branch. The lateral branch had a very tortuous takeoff from the main body of the CS. A Whisper CS J wire was introduced and used to subselect the lateral branch. I was able to pass the wire deep into the vein's branch. I then attempted to pass a quadripolar lead into the branch but met resistance at the os of the vein branch. I then introduced a 90 degree inner sheath to gain more support but again I was unable to pass the quadripolar lead into the CS branch. I removed the inner sheath and then performed an occlusive CS venogram to look for alternative branches. The venogram demonstrated the lateral and posterolateral branch. There was a very small caliber anterolateral branch not suitable for lead placement. The occlusive venogram confirmed the takeoff of the lateral branch was tortuous.  I then removed the balloon and passed a Wholley wire out the AIV and withdrew my outer sheath to the ostium of the main CS body. Once there, I used a Whisper wire to select the posterolateral branch. This wire was easily passed out the branch and around the apex of the heart on the lateral LV wall. I then passed a quadripolar lead (Quartet [id number] #[id number] the apex of the heart to the lateral wall. There were multiple good pacing options with the lead in this position without phrenic stimulation.  The sheath was split and the lead secured to the pectoral fascia. During the process of splitting the sheath and securing the lead and preparing  for the AV junction ablation, this lead was observed to withdraw slightly from its superiorly directed course. It assumed a position deep within the vein near the apical lateral LV. The lead was observed for an extended time in this position and it was noted to be stable with stable electrical parameters.    The pocket then irrigated with copious vancomycin solution.   Device Placement:  The leads were then connected to a [facility] CRT-P generator (serial [id number]). The pocket was irrigated with copious gentamicin solution. The pacemaker was then placed into the pocket. The pocket was then closed in 3 layers with 2.0 Vicryl suture for the subcutaneous layers x 2 and 3.0 suture for the subcuticular layer. Steri- strips and a sterile dressing were then applied. EBL<72m.  There were no early apparent complications.   AV Junction Ablation: Next, under ultrasound guidance I obtained a single access to the right common femoral vein. I then passed an F/J irrigated ablation catheter to the RA and a 3-dimensional electroanatomic map was created. The his bundle was annotated. Ablation was performed at the atrial and superior aspect of the his bundle region successfully inducing complete heart block. There was no escape rhythm with the pacemaker programmed VVI 40. After a 10 minute waiting period, there was no return of AV node function. The ablation catheter was removed from the body along with the sheath. Hemostasis was achieved with a single perclose device.   The CRT-P was programmed to biventricular pace at VVI 90.  During the procedure, 539mof versed and 112.72m78mof fentanyl were administered for moderate sedation. I was present for 100% of the sedation time (2 hours, 16 minutes).  CONCLUSIONS:  1. Successful implantation of a St. Jude BiV for permanent atrial fibrillation and nonischemic cardiomyopathy 2. Successful AV junction ablation 3. No early apparent complications.   History of Present Illness     Joshua Moran a 75 18o. male with a hx of permanent atrial fibrillation, chronic systolic CHF with an LVEF at 45-50% per echo 08/2019, CAD with known totally occluded OM2, and 80-90% small PLOM with no interventional targets, CVA from 08/2019, and HLD who was seen by Dr. LamQuentin Ore the OP setting  for the evaluation of CRT-P implant and AV junction ablation. The patient was noted to have failed amiodarone and continued to have symptomatic AF. Risks and benefits dicussed and patient was willing to proceed.   Hospital Course     Procedure performed 12/11/20 with Dr. LamQuentin Oreetails of procedure as outlined above with successful implantation of St. Jude BiV for permanent atrial fibrillation and nonischemic cardiomyopathy with adjunct AV junction ablation. The patient tolerated the procedure well with no complications. Post procedure CXR with no evidence of pneumothorax and stable placement. Follow up appointments have been made and post procedural care has been reviewed.   Device: St. Jude CRT-P generator serial number [id number] to biventricular pace VVI 90.   Consultants: None    The patient was seen and examined by Dr. KleCaryl Comeso feel that he is stable and ready for discharge today, 12/12/20.   Did the patient have an acute coronary syndrome (MI, NSTEMI, STEMI, etc) this admission?:  No                               Did the patient have a percutaneous coronary intervention (stent / angioplasty)?:  No.   ____________  Discharge Vitals Blood  pressure 129/82, pulse 90, temperature 98.3 F (36.8 C), temperature source Oral, resp. rate 17, height '5\' 11"'  (1.803 m), weight 127 kg, SpO2 94 %.  Filed Weights   12/11/20 0931  Weight: 127 kg    Labs & Radiologic Studies  _____________  DG Chest 2 View  Result Date: 12/12/2020 CLINICAL DATA:  Pacemaker EXAM: CHEST - 2 VIEW COMPARISON:  Chest x-ray dated 09/05/2017. FINDINGS: New LEFT chest wall pacemaker in place. Only ventricular leads seen. Heart size and mediastinal contours are within normal limits. Lungs are clear. No pleural effusion or pneumothorax is seen. IMPRESSION: New LEFT chest wall pacemaker with leads overlying the LEFT ventricle. No pneumothorax. Electronically Signed   By: Franki Cabot M.D.   On: 12/12/2020 08:39    Disposition   Pt is being discharged home today in good condition.  Follow-up Plans & Appointments    Follow-up Information     [facility] Follow up.   Specialty: Cardiology Why: 12/23/20 @ 10:00AM, wound check visit and routine pacemaker programming adjusment Contact information: [address] [place]        Shirley Friar, PA-C Follow up.   Specialty: Physician Assistant Why: 01/08/21 @ 10:00AM, routine pacemaker adjustment Contact information: [place] [address] [phone]                 Discharge Medications   Allergies as of 12/12/2020       Reactions   Xarelto [rivaroxaban] Rash   Dizziness    Empagliflozin Other (See Comments)   (JARDIANCE) dizziness   Penicillins Other (See Comments)   Very flushed  Has patient had a PCN reaction causing immediate rash, facial/tongue/throat swelling, SOB or lightheadedness with hypotension: No Has patient had a PCN reaction causing severe rash involving mucus membranes or skin necrosis: No Has patient had a PCN reaction that required hospitalization No Has patient had a PCN reaction occurring within the last 10 years: No If all of the above answers are "NO", then may proceed with Cephalosporin use.   Statins Other (See Comments)   Pt c/o muscle aches with use of statins.   Vancomycin Rash        Medication List     STOP taking these medications    furosemide 20 MG tablet Commonly known as: LASIX       TAKE these medications    acetaminophen 500 MG tablet Commonly known as: TYLENOL Take 1,000 mg by mouth every 6 (six) hours as needed (PAIN).   apixaban 5 MG Tabs tablet Commonly known as: ELIQUIS Take 1 tablet (5 mg total) by mouth 2 (two) times daily. Notes to patient: Restart Eliquis 12/13/20   brimonidine 0.15 % ophthalmic solution Commonly known as: ALPHAGAN Place 1 drop into the  right eye at bedtime.   cetirizine 10 MG tablet Commonly known as: ZYRTEC Take 10 mg by mouth at bedtime.   ferrous sulfate 325 (65 FE) MG tablet Take 325 mg by mouth in the morning.   finasteride 5 MG tablet Commonly known as: PROSCAR Take 5 mg by mouth every Monday, Wednesday, and Friday. IN THE MORNING   fluticasone 50 MCG/ACT nasal spray Commonly known as: FLONASE Place 2 sprays into both nostrils 2 (two) times daily.   Glucosamine Sulfate 750 MG Tabs Take 750 mg by mouth in the morning and at bedtime.   losartan 25 MG tablet Commonly known as: COZAAR Take 0.5  tablets (12.5 mg total) by mouth at bedtime.   Melatonin 10 MG Tabs Take 10 mg by mouth at bedtime.   multivitamin with minerals Tabs tablet Take 1 tablet by mouth every evening.   rosuvastatin 5 MG tablet Commonly known as: CRESTOR Take 5 mg by mouth every Monday, Wednesday, and Friday. IN THE EVENING   sildenafil 20 MG tablet Commonly known as: REVATIO Take 40-100 mg by mouth daily as needed (erectile dysfunction.).   tamsulosin 0.4 MG Caps capsule Commonly known as: FLOMAX Take 0.4 mg by mouth at bedtime.   Vitamin D3 50 MCG (2000 UT) Tabs Take 2,000 Units by mouth daily.       ASK your doctor about these medications    Baclofen 5 MG Tabs Take 2.5 mg by mouth in the morning, at noon, and at bedtime.   Repatha SureClick 317 MG/ML Soaj Generic drug: Evolocumab INJECT ONE pen into THE SKIN every 14 DAYS   spironolactone 25 MG tablet Commonly known as: ALDACTONE Take 1 tablet (25 mg total) by mouth at bedtime.               Discharge Care Instructions  (From admission, onward)           Start     Ordered   12/12/20 0000  Leave dressing on - Keep it clean, dry, and intact until clinic visit        12/12/20 1343            Outstanding Labs/Studies   None   Duration of Discharge Encounter   Greater than 30 minutes including physician time.  Signed, Kathyrn Drown,  NP 12/12/2020, 1:43 PM

## 2020-12-14 ENCOUNTER — Encounter (HOSPITAL_COMMUNITY): Payer: Self-pay | Admitting: Cardiology

## 2020-12-14 MED FILL — Heparin Sodium (Porcine) Inj 1000 Unit/ML: INTRAMUSCULAR | Qty: 10 | Status: AC

## 2020-12-23 ENCOUNTER — Ambulatory Visit (INDEPENDENT_AMBULATORY_CARE_PROVIDER_SITE_OTHER): Payer: PPO

## 2020-12-23 ENCOUNTER — Other Ambulatory Visit: Payer: Self-pay

## 2020-12-23 DIAGNOSIS — I428 Other cardiomyopathies: Secondary | ICD-10-CM

## 2020-12-23 LAB — CUP PACEART INCLINIC DEVICE CHECK
Battery Remaining Longevity: 44 mo
Battery Voltage: 3.01 V
Brady Statistic RA Percent Paced: 0 %
Brady Statistic RV Percent Paced: 99.98 %
Date Time Interrogation Session: 20220720103851
Implantable Lead Implant Date: 20220708
Implantable Lead Implant Date: 20220708
Implantable Lead Location: 753858
Implantable Lead Location: 753860
Implantable Pulse Generator Implant Date: 20220708
Lead Channel Impedance Value: 612.5 Ohm
Lead Channel Impedance Value: 825 Ohm
Lead Channel Pacing Threshold Amplitude: 0.5 V
Lead Channel Pacing Threshold Amplitude: 0.5 V
Lead Channel Pacing Threshold Amplitude: 1.75 V
Lead Channel Pacing Threshold Amplitude: 1.75 V
Lead Channel Pacing Threshold Pulse Width: 0.5 ms
Lead Channel Pacing Threshold Pulse Width: 0.5 ms
Lead Channel Pacing Threshold Pulse Width: 0.5 ms
Lead Channel Pacing Threshold Pulse Width: 0.5 ms
Lead Channel Sensing Intrinsic Amplitude: 8.2 mV
Lead Channel Setting Pacing Amplitude: 3.5 V
Lead Channel Setting Pacing Amplitude: 5 V
Lead Channel Setting Pacing Pulse Width: 0.5 ms
Lead Channel Setting Pacing Pulse Width: 0.5 ms
Lead Channel Setting Sensing Sensitivity: 4 mV
Pulse Gen Model: 3562
Pulse Gen Serial Number: 3913632

## 2020-12-23 NOTE — Progress Notes (Signed)
Wound check appointment. Steri-strips removed. Wound without redness or edema. Incision edges approximated, wound well healed. Normal device function. Thresholds, sensing, and impedances consistent with implant measurements. Device programmed at 3.5V in LV and 5.0 in RV for extra safety margin until 3 month visit. Rv sensitivity noted to be less than 2:1 safety margin, confirmed with Dr. Quentin Ore no changes today.  Histogram distribution appropriate for patient and level of activity. No mode switches or high ventricular rates noted. Pt underwent AV node ablation on 7/8, device currently programmed with LRL of 90 bpm, per conversation with Dr. Quentin Ore, reduced LRL to 80bpm today.  Patient educated about wound care, arm mobility, lifting restrictions. Patient is enrolled in remote monitoring, next scheduled check 03/15/21.  Pt scheduled for  in office f/u with AT on 01/08/21.

## 2020-12-23 NOTE — Patient Instructions (Signed)
   After Your Pacemaker   Monitor your pacemaker site for redness, swelling, and drainage. Call the device clinic at [phone] if you experience these symptoms or fever/chills.  Your incision was closed with Steri-strips or staples:  You may shower 7 days after your procedure and wash your incision with soap and water. Avoid lotions, ointments, or perfumes over your incision until it is well-healed.  You may use a hot tub or a pool after your wound check appointment if the incision is completely closed.  Do not lift, push or pull greater than 10 pounds with the affected arm until 6 weeks after your procedure. There are no other restrictions in arm movement after your wound check appointment.  You may drive, unless driving has been restricted by your healthcare providers.  Your Pacemaker is not MRI compatible.  Remote monitoring is used to monitor your pacemaker from home. This monitoring is scheduled every 91 days by our office. It allows [place] to keep an eye on the functioning of your device to ensure it is working properly. You will routinely see your Electrophysiologist annually (more often if necessary).

## 2020-12-26 DIAGNOSIS — G4733 Obstructive sleep apnea (adult) (pediatric): Secondary | ICD-10-CM | POA: Diagnosis not present

## 2021-01-06 NOTE — Telephone Encounter (Signed)
error 

## 2021-01-07 NOTE — Progress Notes (Signed)
Electrophysiology Office Note Date: 01/08/2021  ID:  Joshua Moran., DOB August 15, 1945, MRN [id number]  PCP: Lawerance Cruel, MD Primary Cardiologist: [name], MD Electrophysiologist: Vickie Epley, MD   CC: Pacemaker follow-up  Joshua Moran. is a 75 y.o. male seen today for Vickie Epley, MD for routine electrophysiology followup.  Since last being seen in our clinic the patient reports doing overall well. He has intermittent left sided thumping when leaning up against that side. Not reproducible today. He has mild thigh pain that has gradually improved since ablation.  he denies chest pain, palpitations, dyspnea, PND, orthopnea, nausea, vomiting, dizziness, syncope, edema, weight gain, or early satiety.  Device History: St. 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  Cardiac MRI 2021 with EF 39% and moderate RV dysfunction and non-compaction   Dyspnea    with some activity   GERD (gastroesophageal reflux disease)    occasional   Glaucoma    Hyperlipidemia    statin intolerant at doses > Crestor '5mg'$  3 times weekly   OSA on CPAP    PAF (paroxysmal atrial fibrillation) ([place])    CHADS2VASC score 2 on apixaban   Past Surgical History:  Procedure Laterality Date   AV NODE ABLATION N/A 12/11/2020   Procedure: AV NODE ABLATION;  Surgeon: Vickie Epley, MD;  Location: [facility];  Service: Cardiovascular;  Laterality: N/A;   BIV PACEMAKER INSERTION CRT-P N/A 12/11/2020   Procedure: BIV PACEMAKER INSERTION CRT-P;  Surgeon:  Vickie Epley, MD;  Location: [facility];  Service: Cardiovascular;  Laterality: N/A;   CARDIOVERSION N/A 04/01/2020   Procedure: CARDIOVERSION;  Surgeon: Skeet Latch, MD;  Location: [facility];  Service: Cardiovascular;  Laterality: N/A;   CARDIOVERSION N/A 09/08/2020   Procedure: CARDIOVERSION;  Surgeon: Pixie Casino, MD;  Location: [facility];  Service: Cardiovascular;  Laterality: N/A;   EYE SURGERY     bilateral cataracts   KNEE ARTHROSCOPY W/ DEBRIDEMENT     left    RETINAL DETACHMENT SURGERY Bilateral    RIGHT/LEFT HEART CATH AND CORONARY ANGIOGRAPHY N/A 05/06/2020   Procedure: RIGHT/LEFT HEART CATH AND CORONARY ANGIOGRAPHY;  Surgeon: Larey Dresser, MD;  Location: [facility];  Service: Cardiovascular;  Laterality: N/A;   TOTAL KNEE ARTHROPLASTY Left 01/01/2018   Procedure: LEFT TOTAL KNEE ARTHROPLASTY;  Surgeon: Gaynelle Arabian, MD;  Location: [facility];  Service: Orthopedics;  Laterality: Left;  50 mins   TOTAL KNEE ARTHROPLASTY Right 06/25/2018   Procedure: TOTAL KNEE ARTHROPLASTY;  Surgeon: Gaynelle Arabian, MD;  Location: [facility];  Service: Orthopedics;  Laterality: Right;  46mn   VASECTOMY      Current Outpatient Medications  Medication Sig Dispense Refill   acetaminophen (TYLENOL) 500 MG tablet Take 1,000 mg by mouth every 6 (six) hours as needed (PAIN).     apixaban (ELIQUIS) 5 MG TABS tablet Take 1 tablet (5 mg total) by mouth 2 (two) times daily. 6[place]  tablet 0   Baclofen 5 MG TABS Take 2.5 mg by mouth in the morning, at noon, and at bedtime. 45 tablet 5   brimonidine (ALPHAGAN) 0.15 % ophthalmic solution Place 1 drop into the right eye at bedtime.     cetirizine (ZYRTEC) 10 MG tablet Take 10 mg by mouth at bedtime.     Cholecalciferol (VITAMIN D3) 50 MCG (2000 UT) TABS Take 2,000 Units by mouth daily.     ferrous sulfate 325 (65 FE) MG tablet Take 325 mg by mouth in the morning.     finasteride (PROSCAR) 5 MG tablet Take 5 mg by mouth every Monday,  Wednesday, and Friday. IN THE MORNING     fluticasone (FLONASE) 50 MCG/ACT nasal spray Place 2 sprays into both nostrils 2 (two) times daily.      Glucosamine Sulfate 750 MG TABS Take 750 mg by mouth in the morning and at bedtime.     losartan (COZAAR) 25 MG tablet Take 0.5 tablets (12.5 mg total) by mouth at bedtime. 60 tablet 2   Melatonin 10 MG TABS Take 10 mg by mouth at bedtime.      Multiple Vitamin (MULTIVITAMIN WITH MINERALS) TABS tablet Take 1 tablet by mouth every evening.     REPATHA SURECLICK [id number] MG/ML SOAJ INJECT ONE pen into THE SKIN every 14 DAYS 6 mL 3   rosuvastatin (CRESTOR) 5 MG tablet Take 5 mg by mouth every Monday, Wednesday, and Friday. IN THE EVENING     sildenafil (REVATIO) 20 MG tablet Take 40-100 mg by mouth daily as needed (erectile dysfunction.).   5   spironolactone (ALDACTONE) 25 MG tablet Take 1 tablet (25 mg total) by mouth at bedtime. 90 tablet 3   tamsulosin (FLOMAX) 0.4 MG CAPS capsule Take 0.4 mg by mouth at bedtime.      No current facility-administered medications for this visit.    Allergies:   Xarelto [rivaroxaban], Empagliflozin, Penicillins, Statins, and Vancomycin   Social History: Social History   Socioeconomic History   Marital status: Married    Spouse name: Not on file   Number of children: Not on file   Years of education: Not on file   Highest education level: Not on file  Occupational History   Not on file  Tobacco Use   Smoking status: Never   Smokeless tobacco: Never  Vaping Use   Vaping Use: Never used  Substance and Sexual Activity   Alcohol use: Yes    Alcohol/week: 1.0 standard drink    Types: 1 Cans of beer per week    Comment: Rare   Drug use: No   Sexual activity: Not Currently  Other Topics Concern   Not on file  Social History Narrative   Not on file   Social Determinants of Health   Financial Resource Strain: Not on file  Food Insecurity: Not on file  Transportation Needs: Not on file  Physical Activity:  Not on file  Stress: Not on file  Social Connections: Not on file  Intimate Partner Violence: Not on file    Family History: Family History  Problem Relation Age of Onset   Lung cancer Mother        smoker     Review of Systems: All other systems reviewed and are otherwise negative except as noted above.  Physical Exam: Vitals:   01/08/21 1002  BP: 102/80  Pulse: 80  SpO2: 96%  Weight: 297 lb 6.4 oz (134.9 kg)  Height: '5\' 11"'$  (1.803 m)  GEN- The patient is well appearing, alert and oriented x 3 today.   HEENT: normocephalic, atraumatic; sclera clear, conjunctiva pink; hearing intact; oropharynx clear; neck supple  Lungs- Clear to ausculation bilaterally, normal work of breathing.  No wheezes, rales, rhonchi Heart- Regular rate and rhythm, no murmurs, rubs or gallops  GI- soft, non-tender, non-distended, bowel sounds present  Extremities- no clubbing or cyanosis. No edema MS- no significant deformity or atrophy Skin- warm and dry, no rash or lesion; PPM pocket well healed Psych- euthymic mood, full affect Neuro- strength and sensation are intact  PPM Interrogation- reviewed in detail today,  See PACEART report  EKG:  EKG is not ordered today.  Recent Labs: 10/13/2020: B Natriuretic Peptide 100.3 12/09/2020: ALT 17; BUN 12; Creatinine, Ser 1.00; Hemoglobin 13.3; Platelets 214; Potassium 4.5; Sodium 139; TSH 4.140   Wt Readings from Last 3 Encounters:  01/08/21 297 lb 6.4 oz (134.9 kg)  12/11/20 280 lb (127 kg)  10/15/20 297 lb (134.7 kg)    Other studies Reviewed: Additional studies/ records that were reviewed today include: Previous EP office notes, Previous remote checks, Most recent labwork.   Assessment and Plan:  1.  NICM   s/p St. Jude BiV PPM  Echo 10/26/2020 LVEF 35-40% Normal PPM function See Pace Art report LRL changed to 70 and rate response turned on today. Continue losartan 12.5 mg daily, spironolactone 25 mg daily,  Has failed BBs for  lightheadedness and hypotension. Not sure he would tolerate despite pacing. Unlikely to tolerate entresto for same reasons. He had been previously told he was not MRI compatible. From what I can see, he has a complete St Jude MR conditional system, Dr. Quentin Ore reviewed as well. Will review with St Jude to make sure there weren't any extenuating circumstances to prohibit MR conditional status.  2. Permanent AF S/p AV nodal ablation 12/11/20 Continue eliquis 5 mg BID for CHA2DS2/VASc of at least 6.   3. LBBB S/p CRT as above   4. Obesity Body mass index is 41.48 kg/m.    Current medicines are reviewed at length with the patient today.   The patient does not have concerns regarding his medicines.  The following changes were made today:  none  Labs/ tests ordered today include:  Orders Placed This Encounter  Procedures   CUP PACEART INCLINIC DEVICE CHECK    Disposition:   Follow up with Dr. Quentin Ore as scheduled.   Jacalyn Lefevre, PA-C  01/08/2021 11:58 AM  [facility] [address] [place] [address] [phone] (office) [phone] (fax)

## 2021-01-08 ENCOUNTER — Ambulatory Visit (INDEPENDENT_AMBULATORY_CARE_PROVIDER_SITE_OTHER): Payer: PPO | Admitting: Student

## 2021-01-08 ENCOUNTER — Other Ambulatory Visit: Payer: Self-pay

## 2021-01-08 ENCOUNTER — Encounter: Payer: Self-pay | Admitting: Student

## 2021-01-08 VITALS — BP 102/80 | HR 80 | Ht 71.0 in | Wt 297.4 lb

## 2021-01-08 DIAGNOSIS — I4821 Permanent atrial fibrillation: Secondary | ICD-10-CM | POA: Diagnosis not present

## 2021-01-08 DIAGNOSIS — I447 Left bundle-branch block, unspecified: Secondary | ICD-10-CM

## 2021-01-08 DIAGNOSIS — I428 Other cardiomyopathies: Secondary | ICD-10-CM

## 2021-01-08 LAB — CUP PACEART INCLINIC DEVICE CHECK
Battery Remaining Longevity: 57 mo
Battery Voltage: 3.01 V
Brady Statistic RA Percent Paced: 0 %
Brady Statistic RV Percent Paced: 94 %
Date Time Interrogation Session: 20220805115423
Implantable Lead Implant Date: 20220708
Implantable Lead Implant Date: 20220708
Implantable Lead Location: 753858
Implantable Lead Location: 753860
Implantable Pulse Generator Implant Date: 20220708
Lead Channel Impedance Value: 600 Ohm
Lead Channel Impedance Value: 962.5 Ohm
Lead Channel Pacing Threshold Amplitude: 0.75 V
Lead Channel Pacing Threshold Amplitude: 0.75 V
Lead Channel Pacing Threshold Amplitude: 1.5 V
Lead Channel Pacing Threshold Amplitude: 1.5 V
Lead Channel Pacing Threshold Pulse Width: 0.5 ms
Lead Channel Pacing Threshold Pulse Width: 0.5 ms
Lead Channel Pacing Threshold Pulse Width: 0.5 ms
Lead Channel Pacing Threshold Pulse Width: 0.5 ms
Lead Channel Sensing Intrinsic Amplitude: 10 mV
Lead Channel Setting Pacing Amplitude: 3.5 V
Lead Channel Setting Pacing Amplitude: 3.5 V
Lead Channel Setting Pacing Pulse Width: 0.5 ms
Lead Channel Setting Pacing Pulse Width: 0.5 ms
Lead Channel Setting Sensing Sensitivity: 4 mV
Pulse Gen Model: 3562
Pulse Gen Serial Number: 3913632

## 2021-01-08 NOTE — Patient Instructions (Signed)

## 2021-01-11 DIAGNOSIS — N401 Enlarged prostate with lower urinary tract symptoms: Secondary | ICD-10-CM | POA: Diagnosis not present

## 2021-01-15 DIAGNOSIS — D225 Melanocytic nevi of trunk: Secondary | ICD-10-CM | POA: Diagnosis not present

## 2021-01-15 DIAGNOSIS — L814 Other melanin hyperpigmentation: Secondary | ICD-10-CM | POA: Diagnosis not present

## 2021-01-15 DIAGNOSIS — L300 Nummular dermatitis: Secondary | ICD-10-CM | POA: Diagnosis not present

## 2021-01-15 DIAGNOSIS — L821 Other seborrheic keratosis: Secondary | ICD-10-CM | POA: Diagnosis not present

## 2021-01-15 DIAGNOSIS — L570 Actinic keratosis: Secondary | ICD-10-CM | POA: Diagnosis not present

## 2021-01-18 ENCOUNTER — Encounter (HOSPITAL_COMMUNITY): Payer: PPO | Admitting: Cardiology

## 2021-01-18 DIAGNOSIS — E291 Testicular hypofunction: Secondary | ICD-10-CM | POA: Diagnosis not present

## 2021-01-18 DIAGNOSIS — N401 Enlarged prostate with lower urinary tract symptoms: Secondary | ICD-10-CM | POA: Diagnosis not present

## 2021-01-18 DIAGNOSIS — N5201 Erectile dysfunction due to arterial insufficiency: Secondary | ICD-10-CM | POA: Diagnosis not present

## 2021-01-18 DIAGNOSIS — R351 Nocturia: Secondary | ICD-10-CM | POA: Diagnosis not present

## 2021-01-25 ENCOUNTER — Other Ambulatory Visit: Payer: Self-pay

## 2021-01-25 ENCOUNTER — Ambulatory Visit: Payer: PPO | Admitting: Neurology

## 2021-01-25 VITALS — BP 115/79 | HR 85 | Ht 71.0 in | Wt 295.0 lb

## 2021-01-25 DIAGNOSIS — R42 Dizziness and giddiness: Secondary | ICD-10-CM

## 2021-01-25 DIAGNOSIS — I6990 Unspecified sequelae of unspecified cerebrovascular disease: Secondary | ICD-10-CM

## 2021-01-25 NOTE — Patient Instructions (Addendum)
I had a long d/w patient about his remote stroke, atrial fibrillation, dizziness which is multifactorial, risk for recurrent stroke/TIAs, personally independently reviewed imaging studies and stroke evaluation results and answered questions.Continue Eliquis (apixaban) 5 mg twice daily  for secondary stroke prevention and maintain strict control of hypertension with blood pressure goal below 130/90, diabetes with hemoglobin A1c goal below 6.5% and lipids with LDL cholesterol goal below 70 mg/dL. I also advised the patient to eat a healthy diet with plenty of whole grains, cereals, fruits and vegetables, exercise regularly and maintain ideal body weight .check follow-up lipid profile, hemoglobin A1c and carotid ultrasound.  Patient was advised to follow-up with his ENT physician Dr. Lucia Gaskins to discuss possibility of Mnire's disease causing his symptoms of subjective dizziness   Followup in the future with nurse practitioner Janett Billow in 6 months or call earlier if necessary.

## 2021-01-25 NOTE — Progress Notes (Signed)
[facility] [address] [place]. [place] [address] [phone]       STROKE FOLLOW UP NOTE  [name]. Date of Birth:  [birth year] Medical Record Number:  [id number]   Reason for visit: Vertigo    CHIEF COMPLAINT:  Chief Complaint  Patient presents with   Follow-up    Rm 15, with wife , pt states he is doing well     HPI: Update 01/25/2021 : He returns for follow-up after last visit with Janett Billow our nurse practitioner 3 months ago.  He is accompanied by his wife.  Patient states his dizziness is not as bad and is improved somewhat especially after he had his pacemaker recently.  He still occasionally once every month or 2 as an episode where he feels objects moving slightly to the left.  If he sits down quietly and closes eyes for a few minutes this usually gets better.  Occasionally gets dizziness and vertigo when he moves but not constantly.  He has had 1 fall since last visit.  He does admit to longstanding ringing sensation in his ears for several years as well as slight decrease.  He was last seen by ENT physician Dr. Lucia Gaskins 3 months ago but he did not discuss these complaints with him.  He does have an upcoming appointment to see neuro-ophthalmologist Dr. Hassell Done in a few months.  He has had no recurrent stroke or TIA symptoms.  He remains on Eliquis which is tolerating well without bruising or bleeding.  Blood pressure is well controlled today it is 115/79.  His blood sugars have all been good.  He remains on Repatha for his hyperlipidemia as well as on Crestor 3 days a week.  He has not had follow-up lipid profile checked in quite a while   Office visit 10/15/2020, [name] returns for acute visit accompanied by his wife due to recent onset of vertigo different from his dizziness typically related to low blood pressure.    Reports longstanding history of episodes at least over the past couple of years.  Can occur every couple of months and at times can  last for only 5 minutes but other times can last for couple of days.  Describes episode as objects in his environment will be moving such as a poster on the wall or the words on a book. At times he can stop the symptoms when he closes his eyes for a few minutes.  Denies double or blurred vision or room spinning type sensation.  Denies symptoms with quick head movement or position changes.  Denies associated headache or N/V or weakness, numbness, speech changes or cognitive changes. chronic imbalance with worsening imbalance during this time.  Chronic hearing loss and tinnitus as well as chronic sinus issues routinely followed by ENT.  History of retinal detachment s/p repair 2005 and 2008.      History provided for reference purposes only Update 07/20/2020 JM: [name] returns for 67-monthstroke follow-up accompanied by his wife.  He has been doing well since prior visit without new stroke/TIA symptoms.  He does c/o dizziness especially after initiating Jardiance and has improved since discontinuing but will continue to have occasional dizziness in relation to lower blood pressure readings.  He continues to follow with cardiology routinely for blood pressure management as this has been difficult to control.  Blood pressure today 106/72.  Reports compliance with Eliquis, Repatha and Crestor tolerating without side effects.  Reports ongoing compliance with CPAP for  OSA management.  No further concerns at this time.  Update 01/15/2020 JM: [name] returns for stroke follow-up accompanied by his wife He has been stable without residual stroke deficits and denies new or recurring stroke/TIA symptoms He does report intermittent occipital headaches that have been present since his stroke and typically subside with use of Tylenol.  Headaches are not debilitating.  Denies visual changes or photophobia, phonophobia or nausea/vomiting Remains on Eliquis for secondary stroke prevention history of A. fib without  bleeding or bruising Initiating Repatha in addition to Crestor for HLD management with history of statin intolerance managed by cardiology.  Recent lipid panel 11/25/2019 showed LDL 60 Blood pressure today 112/78 Diagnosed with sleep apnea and reports ongoing use of CPAP No concerns at this time  Update 09/11/2019 JM: [name] is being seen for hospital follow-up complaint by his wife.  He has recovered well from a stroke standpoint without residual deficits or new/reoccurring stroke/TIAs.  He continues on Eliquis without bleeding or bruising.  Continues on Crestor 5 mg 3 times weekly but recently referred to lipid clinic for potential initiation of PCSK9 inhibitor with history of statin intolerance.  Blood pressure today 102/64 and reports occasional episodes of lower blood pressure with dizziness and feels as though related to dehydration. Blood pressure at home typically 120-130/80s.  He also plans on undergoing split-night study for possible sleep apnea at the end of this month.  Continues to follow with cardiology and PCP regularly for chronic disease management.  No concerns at this time.  Stroke admission 08/06/2019: Mr. [name]. is a 75 y.o. male with history of atrial fibrillation not on [facility] d/t low CHADSVASC score who presented on 08/06/2019 with recurrent aphasia.  Evaluated by stroke team and Dr. [name] with stroke work-up revealing left frontal lobe infarct embolic pattern in setting of known AF not on AC.  Recommend initiating Eliquis for secondary stroke prevention with CHA2DS2-VASc score 3 (prior 1).  LDL 106 previously on Crestor with history of multiple statin intolerances and recommended consideration of PCSK9 at outpatient follow-up for optimal cholesterol control.  No prior history of HTN or DM.  No prior history of stroke.  Discharged home in stable condition without therapy needs.  Stroke:   L frontal lobe infarct embolic in setting of known AF not on [facility] Code Stroke CT head No  acute abnormality. ASPECTS 10.    CTA head & neck no LVO, mild atherosclerosis head and neck. 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 Sinus dz 2D Echo ejection fraction 45 to 50%.  Mild left ventricular hypertrophy.  No cardiac source of embolism.   LDL 106 HgbA1c 5.8 Eliquis for VTE prophylaxis aspirin 81 mg daily prior to admission, now on Eliquis (apixaban) daily. 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  Cardiac MRI 2021 with EF 39% and moderate RV dysfunction and non-compaction   Dyspnea    with some activity   GERD (gastroesophageal reflux disease)    occasional   Glaucoma    Hyperlipidemia    statin intolerant at doses > Crestor '5mg'$  3 times weekly   OSA on CPAP    PAF (paroxysmal atrial fibrillation) ([place])    CHADS2VASC score 2 on apixaban    PSH:  Past Surgical History:  Procedure Laterality Date   AV NODE ABLATION N/A 12/11/2020   Procedure: AV NODE ABLATION;  Surgeon: Vickie Epley, MD;  Location: [place] [facility];  Service: Cardiovascular;  Laterality: N/A;   BIV PACEMAKER INSERTION CRT-P N/A 12/11/2020   Procedure: BIV PACEMAKER INSERTION CRT-P;  Surgeon: Vickie Epley, MD;  Location: [place] [facility];  Service: Cardiovascular;  Laterality: N/A;   CARDIOVERSION N/A 04/01/2020    Procedure: CARDIOVERSION;  Surgeon: Skeet Latch, MD;  Location: [place];  Service: Cardiovascular;  Laterality: N/A;   CARDIOVERSION N/A 09/08/2020   Procedure: CARDIOVERSION;  Surgeon: Pixie Casino, MD;  [facility] END[facility];  Service: Cardiovascular;  Laterality: N/A;   EYE SURGERY     bilateral cataracts   KNEE ARTHROSCOPY W/ DEBRIDEMENT     left    RETINAL DETACHMENT SURGERY Bilateral    RIGHT/LEFT HEART CATH AND CORONARY ANGIOGRAPHY N/A 05/06/2020   Procedure: RIGHT/LEFT HEART CATH AND CORONARY ANGIOGRAPHY;  Surgeon: Larey Dresser, MD;  Location: [place] [facility];  Service: Cardiovascular;  Laterality: N/A;   TOTAL KNEE ARTHROPLASTY Left 01/01/2018   Procedure: LEFT TOTAL KNEE ARTHROPLASTY;  Surgeon: Gaynelle Arabian, MD;  Location: [facility];  Service: Orthopedics;  Laterality: Left;  50 mins   TOTAL KNEE ARTHROPLASTY Right 06/25/2018   Procedure: TOTAL KNEE ARTHROPLASTY;  Surgeon: Gaynelle Arabian, MD;  Location: [facility];  Service: Orthopedics;  Laterality: Right;  27mn   VASECTOMY      Social History:  Social History   Socioeconomic History   Marital status: Married    Spouse name: Not on file   Number of children: Not on file   Years of education: Not on file   Highest education level: Not on file  Occupational History   Not on file  Tobacco Use   Smoking status: Never   Smokeless tobacco: Never  Vaping Use   Vaping Use: Never used  Substance and Sexual Activity   Alcohol use: Yes    Alcohol/week: 1.0 standard drink    Types: 1 Cans of beer per week    Comment: Rare   Drug use: No   Sexual activity: Not Currently  Other Topics Concern   Not on file  Social History Narrative   Not on file   Social Determinants of Health   Financial Resource Strain: Not on file  Food Insecurity: Not on file  Transportation Needs: Not on file  Physical Activity: Not on file  Stress: Not on file  Social Connections: Not on file  Intimate Partner Violence: Not on  file    Family History:  Family History  Problem Relation Age of Onset   Lung cancer Mother        smoker    Medications:   Current Outpatient Medications on File Prior to Visit  Medication Sig Dispense Refill   acetaminophen (TYLENOL) 500 MG tablet Take 1,000 mg by mouth every 6 (six) hours  as needed (PAIN).     apixaban (ELIQUIS) 5 MG TABS tablet Take 1 tablet (5 mg total) by mouth 2 (two) times daily. 60 tablet 0   Baclofen 5 MG TABS Take 2.5 mg by mouth in the morning, at noon, and at bedtime. 45 tablet 5   brimonidine (ALPHAGAN) 0.15 % ophthalmic solution Place 1 drop into the right eye at bedtime.     cetirizine (ZYRTEC) 10 MG tablet Take 10 mg by mouth at bedtime.     Cholecalciferol (VITAMIN D3) 50 MCG (2000 UT) TABS Take 2,000 Units by mouth daily.     ferrous sulfate 325 (65 FE) MG tablet Take 325 mg by mouth in the morning.     finasteride (PROSCAR) 5 MG tablet Take 5 mg by mouth every Monday, Wednesday, and Friday. IN THE MORNING     fluticasone (FLONASE) 50 MCG/ACT nasal spray Place 2 sprays into both nostrils 2 (two) times daily.      Glucosamine Sulfate 750 MG TABS Take 750 mg by mouth in the morning and at bedtime.     losartan (COZAAR) 25 MG tablet Take 0.5 tablets (12.5 mg total) by mouth at bedtime. 60 tablet 2   Melatonin 10 MG TABS Take 10 mg by mouth at bedtime.      Multiple Vitamin (MULTIVITAMIN WITH MINERALS) TABS tablet Take 1 tablet by mouth every evening.     REPATHA SURECLICK [id number] MG/ML SOAJ INJECT ONE pen into THE SKIN every 14 DAYS 6 mL 3   rosuvastatin (CRESTOR) 5 MG tablet Take 5 mg by mouth every Monday, Wednesday, and Friday. IN THE EVENING     sildenafil (REVATIO) 20 MG tablet Take 40-100 mg by mouth daily as needed (erectile dysfunction.).   5   spironolactone (ALDACTONE) 25 MG tablet Take 1 tablet (25 mg total) by mouth at bedtime. 90 tablet 3   tamsulosin (FLOMAX) 0.4 MG CAPS capsule Take 0.4 mg by mouth at bedtime.      No current  facility-administered medications on file prior to visit.    Allergies:   Allergies  Allergen Reactions   Xarelto [Rivaroxaban] Rash    Dizziness    Empagliflozin Other (See Comments)    (JARDIANCE) dizziness   Penicillins Other (See Comments)    Very flushed  Has patient had a PCN reaction causing immediate rash, facial/tongue/throat swelling, SOB or lightheadedness with hypotension: No Has patient had a PCN reaction causing severe rash involving mucus membranes or skin necrosis: No Has patient had a PCN reaction that required hospitalization No Has patient had a PCN reaction occurring within the last 10 years: No If all of the above answers are "NO", then may proceed with Cephalosporin use.    Statins Other (See Comments)    Pt c/o muscle aches with use of statins.   Vancomycin Rash     Physical Exam  Vitals:   01/25/21 1347  BP: 115/79  Pulse: 85  Weight: 295 lb (133.8 kg)  Height: '5\' 11"'$  (1.803 m)   Body mass index is 41.14 kg/m. No results found.  General: Obese elderly Caucasian male, pleasant elderly Caucasian male, seated, in no evident distress Head: head normocephalic and atraumatic.   Neck: supple with no carotid or supraclavicular bruits Cardiovascular: regular rate and rhythm, no murmurs Musculoskeletal: no deformity Skin:  no rash/petichiae Vascular:  Normal pulses all extremities   Neurologic Exam Mental Status: Awake and fully alert. Normal speech and language. Oriented to place and time. Recent and remote memory intact.  Attention span, concentration and fund of knowledge appropriate. Mood and affect appropriate.  Cranial Nerves: Pupils equal, briskly reactive to light. Extraocular movements full without nystagmus. Visual fields full to confrontation. Right eye ptosis - hx of detached retina. Hearing intact. Facial sensation intact. Face, tongue, palate moves normally and symmetrically.  Motor: Normal bulk and tone. Normal strength in all tested  extremity muscles. Sensory.: intact to touch , pinprick , position and vibratory sensation.  Coordination: Rapid alternating movements normal in all extremities. Finger-to-nose and heel-to-shin performed accurately bilaterally.  Slightly unsteady when standing on either foot unsupported and Narrow-base. Gait and Station: Arises from chair without difficulty. Stance is normal. Gait demonstrates normal stride length and balance without use of assistive device. Mild difficulty with tandem walk and heel toe.   Reflexes: 1+ and symmetric except ankle jerks are depressed. Toes downgoing.      ASSESSMENT: Maverik Bueti. is a 75 y.o. year old male presented with aphasia on 08/06/2019 with stroke work-up revealing left frontal lobe infarct embolic pattern secondary to known AF not on [facility] therefore Eliquis initiated. Vascular risk factors include HLD, OSA on CPAP and A. Fib.  Longstanding complaints of intermittent oscillopsia and dizziness of unclear etiology.  He may have a component of underlying Mnire's disease given history of chronic longstanding tinnitus, hearing loss and vertigo    PLAN:  I had a long d/w patient about his remote stroke, atrial fibrillation, dizziness which is multifactorial, risk for recurrent stroke/TIAs, personally independently reviewed imaging studies and stroke evaluation results and answered questions.Continue Eliquis (apixaban) 5 mg twice daily  for secondary stroke prevention and maintain strict control of hypertension with blood pressure goal below 130/90, diabetes with hemoglobin A1c goal below 6.5% and lipids with LDL cholesterol goal below 70 mg/dL. I also advised the patient to eat a healthy diet with plenty of whole grains, cereals, fruits and vegetables, exercise regularly and maintain ideal body weight .check follow-up lipid profile, hemoglobin A1c and carotid ultrasound.  Patient was advised to follow-up with his ENT physician Dr. Lucia Gaskins to discuss possibility of  Mnire's disease causing his symptoms of subjective dizziness   Followup in the future with nurse practitioner Janett Billow in 6 months or call earlier if necessary.  Greater than 50% time during this 30-minute follow-up visit was spent in counseling and coordination of care and discussion with care team.  Antony Contras, MD  [facility] [address] [place], [place] [address]  Phone [phone] Fax [phone] Note: This document was prepared with digital dictation and possible smart phrase technology. Any transcriptional errors that result from this process are unintentional.

## 2021-01-26 LAB — LIPID PANEL
Chol/HDL Ratio: 2.6 ratio (ref 0.0–5.0)
Cholesterol, Total: 123 mg/dL (ref 100–199)
HDL: 47 mg/dL (ref >39)
LDL Chol Calc (NIH): 57 mg/dL (ref 0–99)
Triglycerides: 102 mg/dL (ref 0–149)
VLDL Cholesterol Cal: 19 mg/dL (ref 5–40)

## 2021-01-26 LAB — HEMOGLOBIN A1C
Est. average glucose Bld gHb Est-mCnc: 114 mg/dL
Hgb A1c MFr Bld: 5.6 % (ref 4.8–5.6)

## 2021-01-27 ENCOUNTER — Telehealth: Payer: Self-pay | Admitting: Emergency Medicine

## 2021-01-27 NOTE — Telephone Encounter (Signed)
-----   Message from Garvin Fila, MD sent at 01/27/2021  8:18 AM EDT ----- Joshua Moran inform the patient that cholesterol profile and screening test for diabetes were both satisfactory ----- Message ----- From: Interface, [facility] Lab Results In Sent: 01/26/2021   5:37 AM EDT To: Garvin Fila, MD

## 2021-01-28 ENCOUNTER — Telehealth: Payer: Self-pay | Admitting: Neurology

## 2021-01-28 NOTE — Telephone Encounter (Signed)
Health team no auth require ref # Salomon Fick, sent Butch Penny a message she will reach out to the patient to schedule.

## 2021-02-01 ENCOUNTER — Ambulatory Visit (HOSPITAL_COMMUNITY)
Admission: RE | Admit: 2021-02-01 | Discharge: 2021-02-01 | Disposition: A | Discharge status: Home / Self Care | Payer: PPO | Source: Ambulatory Visit | Attending: Neurology | Admitting: Neurology

## 2021-02-01 ENCOUNTER — Other Ambulatory Visit: Payer: Self-pay

## 2021-02-01 DIAGNOSIS — R42 Dizziness and giddiness: Secondary | ICD-10-CM

## 2021-02-01 DIAGNOSIS — I6990 Unspecified sequelae of unspecified cerebrovascular disease: Secondary | ICD-10-CM | POA: Insufficient documentation

## 2021-02-01 NOTE — Progress Notes (Signed)
Carotid duplex has been completed.   Preliminary results in CV Proc.   Joshua Moran 02/01/2021 9:17 AM

## 2021-02-09 ENCOUNTER — Telehealth: Payer: Self-pay | Admitting: *Deleted

## 2021-02-09 NOTE — Telephone Encounter (Signed)
   [facility] Pre-operative Risk Assessment    Patient Name: Joshua Moran.  DOB: July 11, 1945 MRN: [id number]  HEARTCARE STAFF:  - IMPORTANT!!!!!! Under Visit Info/Reason for Call, type in Other and utilize the format Clearance MM/DD/YY or Clearance TBD. Do not use dashes or single digits. - Please review there is not already an duplicate clearance open for this procedure. - If request is for dental extraction, please clarify the # of teeth to be extracted. - If the patient is currently at the dentist's office, call Pre-Op Callback Staff (MA/nurse) to input urgent request.  - If the patient is not currently in the dentist office, please route to the Pre-Op pool.  Request for surgical clearance:  What type of surgery is being performed? COLONOSCOPY  When is this surgery scheduled? TBD  What type of clearance is required (medical clearance vs. Pharmacy clearance to hold med vs. Both)? BOTH  Are there any medications that need to be held prior to surgery and how long?  [place] name and name of physician performing surgery? [facility]; DR. Arta Silence  What is the office phone number? [phone]   7.   What is the office fax number? [phone]  8.   Anesthesia type (None, local, MAC, general) ? PROPOFOL   Julaine Hua 02/09/2021, 1:58 PM  _________________________________________________________________   (provider comments below)

## 2021-02-09 NOTE — Telephone Encounter (Signed)
Ok to use clearance rec from 6/13 note where Dr Quentin Ore cleared pt to hold anticoag 8 weeks after his device implant/AV node ablation on 12/11/20. Recommend holding Eliquis for 1 day prior to procedure given elevated CV risk and resume as soon as safely possible.

## 2021-02-10 NOTE — Telephone Encounter (Signed)
   Name: Joshua Moran.  DOB: [birth year]  MRN: [id number]   Primary Cardiologist: [name], MD  Chart reviewed as part of pre-operative protocol coverage. Patient was contacted 02/10/2021 in reference to pre-operative risk assessment for pending surgery as outlined below.  Joshua Moran. was last seen on 01/08/21 by Oda Kilts PAC.  Since that day, Joshua Moran. has done well. He is able to complete more than 4.0 METS without angina. He had a reassuring stress test in April 2022.  He had an ablation and PPM implant on 12/11/20 and can undergo colonoscopy 8 weeks after procedure, typically do not hold [place] for three months following ablation. He is cleared to hold eliquis for 1 day prior to procedure and resume as soon as safe to do so.   Therefore, based on ACC/AHA guidelines, the patient would be at acceptable risk for the planned procedure without further cardiovascular testing.   The patient was advised that if he develops new symptoms prior to surgery to contact our office to arrange for a follow-up visit, and he verbalized understanding.  I will route this recommendation to the requesting party via Epic fax function and remove from pre-op pool. Please call with questions.  Tami Lin Vaniyah Lansky, PA 02/10/2021, 10:14 AM

## 2021-02-12 ENCOUNTER — Other Ambulatory Visit: Payer: Self-pay | Admitting: Adult Health

## 2021-02-12 DIAGNOSIS — H5319 Other subjective visual disturbances: Secondary | ICD-10-CM

## 2021-02-17 ENCOUNTER — Other Ambulatory Visit: Payer: Self-pay | Admitting: Adult Health

## 2021-02-17 DIAGNOSIS — I639 Cerebral infarction, unspecified: Secondary | ICD-10-CM | POA: Diagnosis not present

## 2021-02-17 DIAGNOSIS — M179 Osteoarthritis of knee, unspecified: Secondary | ICD-10-CM | POA: Diagnosis not present

## 2021-02-17 DIAGNOSIS — I4891 Unspecified atrial fibrillation: Secondary | ICD-10-CM | POA: Diagnosis not present

## 2021-02-17 DIAGNOSIS — E7800 Pure hypercholesterolemia, unspecified: Secondary | ICD-10-CM | POA: Diagnosis not present

## 2021-02-17 DIAGNOSIS — H5319 Other subjective visual disturbances: Secondary | ICD-10-CM

## 2021-02-17 DIAGNOSIS — I5022 Chronic systolic (congestive) heart failure: Secondary | ICD-10-CM | POA: Diagnosis not present

## 2021-02-17 DIAGNOSIS — D649 Anemia, unspecified: Secondary | ICD-10-CM | POA: Diagnosis not present

## 2021-02-17 DIAGNOSIS — U071 COVID-19: Secondary | ICD-10-CM | POA: Diagnosis not present

## 2021-02-17 DIAGNOSIS — N401 Enlarged prostate with lower urinary tract symptoms: Secondary | ICD-10-CM | POA: Diagnosis not present

## 2021-03-05 ENCOUNTER — Ambulatory Visit (HOSPITAL_COMMUNITY)
Admission: RE | Admit: 2021-03-05 | Discharge: 2021-03-05 | Disposition: A | Discharge status: Home / Self Care | Payer: PPO | Source: Ambulatory Visit | Attending: Cardiology | Admitting: Cardiology

## 2021-03-05 ENCOUNTER — Other Ambulatory Visit: Payer: Self-pay

## 2021-03-05 VITALS — BP 106/70 | HR 72 | Ht 71.0 in | Wt 295.8 lb

## 2021-03-05 DIAGNOSIS — G4733 Obstructive sleep apnea (adult) (pediatric): Secondary | ICD-10-CM | POA: Diagnosis not present

## 2021-03-05 DIAGNOSIS — Z7901 Long term (current) use of anticoagulants: Secondary | ICD-10-CM | POA: Diagnosis not present

## 2021-03-05 DIAGNOSIS — I429 Cardiomyopathy, unspecified: Secondary | ICD-10-CM | POA: Diagnosis not present

## 2021-03-05 DIAGNOSIS — I420 Dilated cardiomyopathy: Secondary | ICD-10-CM

## 2021-03-05 DIAGNOSIS — I2510 Atherosclerotic heart disease of native coronary artery without angina pectoris: Secondary | ICD-10-CM | POA: Diagnosis not present

## 2021-03-05 DIAGNOSIS — Z79899 Other long term (current) drug therapy: Secondary | ICD-10-CM | POA: Diagnosis not present

## 2021-03-05 DIAGNOSIS — R42 Dizziness and giddiness: Secondary | ICD-10-CM | POA: Diagnosis not present

## 2021-03-05 DIAGNOSIS — I4821 Permanent atrial fibrillation: Secondary | ICD-10-CM | POA: Diagnosis not present

## 2021-03-05 DIAGNOSIS — Z8616 Personal history of COVID-19: Secondary | ICD-10-CM | POA: Diagnosis not present

## 2021-03-05 DIAGNOSIS — Z7984 Long term (current) use of oral hypoglycemic drugs: Secondary | ICD-10-CM | POA: Insufficient documentation

## 2021-03-05 DIAGNOSIS — I951 Orthostatic hypotension: Secondary | ICD-10-CM | POA: Insufficient documentation

## 2021-03-05 DIAGNOSIS — Z8673 Personal history of transient ischemic attack (TIA), and cerebral infarction without residual deficits: Secondary | ICD-10-CM | POA: Diagnosis not present

## 2021-03-05 DIAGNOSIS — I5022 Chronic systolic (congestive) heart failure: Secondary | ICD-10-CM | POA: Diagnosis not present

## 2021-03-05 DIAGNOSIS — I4819 Other persistent atrial fibrillation: Secondary | ICD-10-CM | POA: Insufficient documentation

## 2021-03-05 LAB — BASIC METABOLIC PANEL
Anion gap: 7 (ref 5–15)
BUN: 13 mg/dL (ref 8–23)
CO2: 25 mmol/L (ref 22–32)
Calcium: 9 mg/dL (ref 8.9–10.3)
Chloride: 107 mmol/L (ref 98–111)
Creatinine, Ser: 1.04 mg/dL (ref 0.61–1.24)
GFR, Estimated: >60 mL/min (ref >60)
Glucose, Bld: 94 mg/dL (ref 70–99)
Potassium: 4.8 mmol/L (ref 3.5–5.1)
Sodium: 139 mmol/L (ref 135–145)

## 2021-03-05 LAB — BRAIN NATRIURETIC PEPTIDE: B Natriuretic Peptide: 104.3 pg/mL — ABNORMAL HIGH (ref 0.0–100.0)

## 2021-03-05 MED ORDER — DAPAGLIFLOZIN PROPANEDIOL 10 MG PO TABS: 10.0000 mg | ORAL_TABLET | Freq: Every day | ORAL | 6 refills | Status: DC

## 2021-03-05 NOTE — Patient Instructions (Signed)
Start Farxiga 10 mg Daily  Labs done today, your results will be available in MyChart, we will contact you for abnormal readings.  Your physician recommends that you return for lab work in: 10 days  Your physician recommends that you schedule a follow-up appointment in: 3 months (January 2023), **PLEASE CALL OUR OFFICE IN November TO SCHEDULE THIS APPOINTMENT  If you have any questions or concerns before your next appointment please send [place] a message through [place] or call our office at [phone].    TO LEAVE A MESSAGE FOR THE NURSE SELECT OPTION 2, PLEASE LEAVE A MESSAGE INCLUDING: YOUR NAME DATE OF BIRTH CALL BACK NUMBER REASON FOR CALL**this is important as we prioritize the call backs  YOU WILL RECEIVE A CALL BACK THE SAME DAY AS LONG AS YOU CALL BEFORE 4:00 PM  At the [facility], you and your health needs are our priority. As part of our continuing mission to provide you with exceptional heart care, we have created designated Provider Care Teams. These Care Teams include your primary Cardiologist (physician) and Advanced Practice Providers (APPs- Physician Assistants and Nurse Practitioners) who all work together to provide you with the care you need, when you need it.   You may see any of the following providers on your designated Care Team at your next follow up: Dr Glori Bickers Dr Loralie Champagne Dr Patrice Paradise, NP Lyda Jester, [place] Ginnie Smart Audry Riles, PharmD   Please be sure to bring in all your medications bottles to every appointment.

## 2021-03-06 NOTE — Progress Notes (Signed)
PCP: Lawerance Cruel, MD Cardiology: Dr. Radford Pax HF Cardiology: Dr. Aundra Dubin  75 y.o. with history of chronic systolic CHF and paroxysmal atrial fibrillation was referred by Dr. Radford Pax for evaluation of CHF.  Patient has been short of breath for years, worse over the last 6 months.  He has had an extensive workup.  Echo in 3/21 showed EF 45-50% with mildly decreased RV function.  Cardiolite in 4/21 showed fixed apical defect.  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  PYP scan in 8/21 was not suggestive of TTR cardiac amyloidosis.   He had been in persistent atrial fibrillation for about 2 years.  He saw Dr. Quentin Ore and was started on amiodarone.  DCCV was done in 10/21 back to NSR.  He was found to have M-spike on SPEP.  He saw Dr. Marin Olp who thought this was MGUS.   LHC/RHC in 12/21 showed occluded small OM2 and 80-90% stenosis mid PLOM (small caliber), no interventional target; normal filling pressures and preserved cardiac output.   He was unable to tolerate Jardiance due to extreme dizziness. He is off both Coreg and Toprol XL due to lightheadedness/hypotension.   He developed recurrent atrial fibrillation and failed DCCV in 4/22.  He has been in persistent atrial fibrillation, Dr Quentin Ore stopped his amiodarone due to futility.  In 7/22, he had AV nodal ablation with St Jude CRT-P device placement.   Echo in 5/22 showed EF 35-40%, mild LV enlargement, normal RV.   Patient returns for followup of CHF.  He is doing better since AV nodal ablation.  Less dizziness.  He does yardwork and walks on the treadmill for 30 minutes at a time.  No dyspnea except near the end of his time on the treadmill.  No orthopnea/PND.  No chest pain.  He had COVID-19 in 9/22, still has a mild residual cough. Weight down 2 lbs.   Labs (7/21): Urine immunofixation with monoclonal IgG protein, SPEP with M-spike.   Labs (10/21): TSH  normal, K 4, creatinine 0.97, LFTs normal Labs (11/21): K 4.8, creatinine 1.07, hgb 14.7, LFTs normal, TSH normal Labs (12/21): LDL 66, HDL 45 Labs (1/22): K 3.9, creatinine 1.07 Labs (4/22): K 4.5, creatinine 1.14, TSH normal, hgb 13.8 Labs (7/22): K 4.5, creatinine 1.0 Labs (8/22): LDL 57  St Jude device interrogation: stable thoracic impedance.   PMH: 1. CVA: Occurred in 3/21, he was not on apixaban at the time.  2. GERD 3. OSA: Uses CPAP.  4. Hyperlipidemia 5. Atrial fibrillation: Paroxysmal.  - DCCV to NSR in 10/21.  - Failed DCCV in 4/22, stopped amiodarone.  - AV nodal ablation with St Jude CRT-P in 7/22.  6. MGUS 7. Chronic systolic CHF: Echo (6/64) with EF 45-50%, mild LVH, mildly decreased RV systolic function. Primarily nonischemic cardiomyopathy.  - Cardiolite (4/21): Fixed apical defect.  - Cardiac MRI (9/21): Mild LV dilation with mild LV hypertrophy, EF 39%, prominent trabeculations concerning for noncompaction. Mild RV dilation with EF 28%, severe LAE.  No delayed enhancement.  - PYP scan (8/21): grade 1, H/CL 1-1.5.  Unlikely to have cardiac amyloidosis.  - LHC/RHC (12/21): small OM2 totally occluded, 80-90% mid small PLOM (no interventional target); mean RA 5, PA 25/9, mean PCWP 5, CI 2.77 Fick/3.29 Thermo.  - St Jude CRT-P  8. Orthostatic hypotension.  9. CAD: Coronary angiography 12/21 with small OM2 totally occluded, 80-90% mid small PLOM (no interventional target) 10.  Carotid ultrasound (8/22): mild BICA stenosis.  11. COVID-19 9/22  Social History   Socioeconomic History   Marital status: Married    Spouse name: Not on file   Number of children: Not on file   Years of education: Not on file   Highest education level: Not on file  Occupational History   Not on file  Tobacco Use   Smoking status: Never   Smokeless tobacco: Never  Vaping Use   Vaping Use: Never used  Substance and Sexual Activity   Alcohol use: Yes    Alcohol/week: 1.0 standard  drink    Types: 1 Cans of beer per week    Comment: Rare   Drug use: No   Sexual activity: Not Currently  Other Topics Concern   Not on file  Social History Narrative   Not on file   Social Determinants of Health   Financial Resource Strain: Not on file  Food Insecurity: Not on file  Transportation Needs: Not on file  Physical Activity: Not on file  Stress: Not on file  Social Connections: Not on file  Intimate Partner Violence: Not on file   Family History  Problem Relation Age of Onset   Lung cancer Mother        smoker   ROS: All systems reviewed and negative except as per HPI.   Current Outpatient Medications  Medication Sig Dispense Refill   acetaminophen (TYLENOL) 500 MG tablet Take 1,000 mg by mouth every 6 (six) hours as needed (PAIN).     apixaban (ELIQUIS) 5 MG TABS tablet Take 1 tablet (5 mg total) by mouth 2 (two) times daily. 60 tablet 0   brimonidine (ALPHAGAN) 0.15 % ophthalmic solution Place 1 drop into the right eye at bedtime.     cetirizine (ZYRTEC) 10 MG tablet Take 10 mg by mouth at bedtime.     Cholecalciferol (VITAMIN D3) 50 MCG (2000 UT) TABS Take 2,000 Units by mouth daily.     dapagliflozin propanediol (FARXIGA) 10 MG TABS tablet Take 1 tablet (10 mg total) by mouth daily before breakfast. 30 tablet 6   ferrous sulfate 325 (65 FE) MG tablet Take 325 mg by mouth in the morning.     fluticasone (FLONASE) 50 MCG/ACT nasal spray Place 2 sprays into both nostrils 2 (two) times daily.      Glucosamine Sulfate 750 MG TABS Take 750 mg by mouth in the morning and at bedtime.     losartan (COZAAR) 25 MG tablet Take 0.5 tablets (12.5 mg total) by mouth at bedtime. 60 tablet 2   Melatonin 10 MG TABS Take 10 mg by mouth at bedtime.      Multiple Vitamin (MULTIVITAMIN WITH MINERALS) TABS tablet Take 1 tablet by mouth every evening.     REPATHA SURECLICK 500 MG/ML SOAJ INJECT ONE pen into THE SKIN every 14 DAYS 6 mL 3   rosuvastatin (CRESTOR) 5 MG tablet Take 5 mg  by mouth every Monday, Wednesday, and Friday. IN THE EVENING     sildenafil (REVATIO) 20 MG tablet Take 40-100 mg by mouth daily as needed (erectile dysfunction.).   5   spironolactone (ALDACTONE) 25 MG tablet Take 1 tablet (25 mg total) by mouth at bedtime. 90 tablet 3   tamsulosin (FLOMAX) 0.4 MG CAPS capsule Take 0.4 mg by mouth at bedtime.      No current facility-administered medications for this encounter.   BP 106/70   Pulse 72   Ht 5\' 11"  (1.803 m)  Wt 134.2 kg (295 lb 12.8 oz)   SpO2 95%   BMI 41.26 kg/m  General: NAD Neck: No JVD, no thyromegaly or thyroid nodule.  Lungs: Clear to auscultation bilaterally with normal respiratory effort. CV: Nondisplaced PMI.  Heart regular S1/S2, no S3/S4, no murmur.  Trace edema.  No carotid bruit.  Normal pedal pulses.  Abdomen: Soft, nontender, no hepatosplenomegaly, no distention.  Skin: Intact without lesions or rashes.  Neurologic: Alert and oriented x 3.  Psych: Normal affect. Extremities: No clubbing or cyanosis.  HEENT: Normal.   Assessment/Plan: 1. Chronic systolic CHF: Cardiac MRI in 9/21 with EF 39% and evidence for noncompaction cardiomyopathy, no delayed enhancement, significant RV dysfunction with RV EF 22%.  PYP scan was not suggestive of TTR amyloidosis.  12/21 LHC showed CAD but does not explain cardiomyopathy, no interventional target.  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  He has a history of orthostatic hypotension which has limited medication titration but this is doing better since AV nodal ablation with St Jude CRT-P.  NYHA class II.  He is not volume overloaded on exam or by Corvue.  - Start dapagliflozin 10 mg daily, BMET today and in 10 days.  - He did not tolerate Jardiance due to dizziness.  - Continue spironolactone 25 mg daily.  - Continue losartan to 12.5 mg daily. Suspect BP will not tolerate transition to Entresto.  - Unable to tolerate Coreg or Toprol XL due to  lightheadedness/low BP.  2. Atrial fibrillation: Persistent.  He has severe LAE. Not good candidate for atrial fibrillation ablation per EP.  He failed DCCV on amiodarone in 4/22, amiodarone stopped.  AV nodal ablation with St Jude CRT-P in 7/22.  - Continue apixaban 5 mg bid.  3. OSA: Continue CPAP.  4. Orthostatic hypotension: Limits medications.   - Continue compression stockings and follow over time.   5. CAD: Moderate disease on cath in 12/21, no intervention target.  No chest pain.  Extent of disease does not explain cardiomyopathy.   - Continue Crestor and Repatha, good lipids in 8/22.  - He is on apixaban with stable CAD, so no ASA.   Followup in 3 months.   Loralie Champagne 03/06/2021

## 2021-03-08 ENCOUNTER — Other Ambulatory Visit (HOSPITAL_COMMUNITY): Payer: Self-pay

## 2021-03-08 ENCOUNTER — Telehealth (HOSPITAL_COMMUNITY): Payer: Self-pay | Admitting: Pharmacy Technician

## 2021-03-08 NOTE — Telephone Encounter (Signed)
Patient called back with income information, the patient was approved for a PAN HF grant.  Member ID: [id number] Group ID: [id number] RxBin ID: [id number] PCN: [id number] Eligibility Start Date: 12/08/2020 Eligibility End Date: 03/07/2022 Assistance Amount: $1,000.00

## 2021-03-08 NOTE — Telephone Encounter (Signed)
Advanced Heart Failure Patient Advocate Encounter  I received a PA request for Farxiga from the patient's HTA insurance. Upon further review, the patient's insurance prefers [place]. The current 30 day co-pay is, $139.07. The patient is currently in the donut hole, so the co-pay is more expensive during this time.   Called and left the patient message to call back so we can discuss the change and affordability.

## 2021-03-08 NOTE — Telephone Encounter (Signed)
Advanced Heart Failure Patient Advocate Encounter  Patient called back. Patient has tried and failed Jardiance in the past, due to extreme dizziness. Will attempt a PA for [place]. Advised patient that the co-pay would likely be at least $145 right now. He is going to look at his financial documents and see if he qualifies for the PAN HF grant. Will get back to me with that information.   Received notification from [facility] that prior authorization for Wilder Glade is required.   PA submitted on CoverMyMeds Key [id number] Status is pending   Will continue to follow.

## 2021-03-12 ENCOUNTER — Other Ambulatory Visit (HOSPITAL_COMMUNITY): Payer: Self-pay

## 2021-03-12 NOTE — Telephone Encounter (Signed)
Advanced Heart Failure Patient Advocate Encounter  Prior Authorization for Wilder Glade has been approved.    PA# [id number] Effective dates: 03/09/21 through 06/05/21  Patients co-pay is $133.80 (30 days, donut hole)  Called and left the patient message.   Charlann Boxer, CPhT

## 2021-03-15 ENCOUNTER — Ambulatory Visit (INDEPENDENT_AMBULATORY_CARE_PROVIDER_SITE_OTHER): Payer: PPO

## 2021-03-15 ENCOUNTER — Other Ambulatory Visit (HOSPITAL_COMMUNITY): Payer: PPO

## 2021-03-15 DIAGNOSIS — I420 Dilated cardiomyopathy: Secondary | ICD-10-CM

## 2021-03-15 DIAGNOSIS — E291 Testicular hypofunction: Secondary | ICD-10-CM | POA: Diagnosis not present

## 2021-03-15 DIAGNOSIS — N401 Enlarged prostate with lower urinary tract symptoms: Secondary | ICD-10-CM | POA: Diagnosis not present

## 2021-03-15 DIAGNOSIS — R351 Nocturia: Secondary | ICD-10-CM | POA: Diagnosis not present

## 2021-03-15 DIAGNOSIS — N5201 Erectile dysfunction due to arterial insufficiency: Secondary | ICD-10-CM | POA: Diagnosis not present

## 2021-03-17 LAB — CUP PACEART REMOTE DEVICE CHECK
Battery Remaining Longevity: 62 mo
Battery Remaining Percentage: 95.5 %
Battery Voltage: 2.99 V
Date Time Interrogation Session: 20221010020009
Implantable Lead Implant Date: 20220708
Implantable Lead Implant Date: 20220708
Implantable Lead Location: 753858
Implantable Lead Location: 753860
Implantable Pulse Generator Implant Date: 20220708
Lead Channel Impedance Value: 610 Ohm
Lead Channel Impedance Value: 910 Ohm
Lead Channel Pacing Threshold Amplitude: 0.75 V
Lead Channel Pacing Threshold Amplitude: 1.5 V
Lead Channel Pacing Threshold Pulse Width: 0.5 ms
Lead Channel Pacing Threshold Pulse Width: 0.5 ms
Lead Channel Sensing Intrinsic Amplitude: 7 mV
Lead Channel Setting Pacing Amplitude: 3.5 V
Lead Channel Setting Pacing Amplitude: 3.5 V
Lead Channel Setting Pacing Pulse Width: 0.5 ms
Lead Channel Setting Pacing Pulse Width: 0.5 ms
Lead Channel Setting Sensing Sensitivity: 4 mV
Pulse Gen Model: 3562
Pulse Gen Serial Number: 3913632

## 2021-03-19 ENCOUNTER — Encounter: Payer: Self-pay | Admitting: Cardiology

## 2021-03-19 ENCOUNTER — Other Ambulatory Visit: Payer: Self-pay

## 2021-03-19 ENCOUNTER — Ambulatory Visit: Payer: PPO | Admitting: Cardiology

## 2021-03-19 VITALS — BP 128/70 | HR 72 | Ht 71.0 in | Wt 294.0 lb

## 2021-03-19 DIAGNOSIS — Z950 Presence of cardiac pacemaker: Secondary | ICD-10-CM

## 2021-03-19 DIAGNOSIS — I4821 Permanent atrial fibrillation: Secondary | ICD-10-CM | POA: Diagnosis not present

## 2021-03-19 DIAGNOSIS — I428 Other cardiomyopathies: Secondary | ICD-10-CM | POA: Diagnosis not present

## 2021-03-19 DIAGNOSIS — I420 Dilated cardiomyopathy: Secondary | ICD-10-CM

## 2021-03-19 NOTE — Patient Instructions (Addendum)
Medication Instructions:  Your physician recommends that you continue on your current medications as directed. Please refer to the Current Medication list given to you today. *If you need a refill on your cardiac medications before your next appointment, please call your pharmacy*  Lab Work: None ordered. If you have labs (blood work) drawn today and your tests are completely normal, you will receive your results only by: [place] (if you have MyChart) OR A paper copy in the mail If you have any lab test that is abnormal or we need to change your treatment, we will call you to review the results.  Testing/Procedures: None ordered.  Follow-Up: At [facility], you and your health needs are our priority.  As part of our continuing mission to provide you with exceptional heart care, we have created designated Provider Care Teams.  These Care Teams include your primary Cardiologist (physician) and Advanced Practice Providers (APPs -  Physician Assistants and Nurse Practitioners) who all work together to provide you with the care you need, when you need it.  Your next appointment:   Your physician wants you to follow-up in: 9 months You may see Vickie Epley, MD or one of the following Advanced Practice Providers on your designated Care Team:   Tommye Standard, [place] Legrand Como "Jonni Sanger" Hydro, [place] You will receive a reminder letter in the mail two months in advance. If you don't receive a letter, please call our office to schedule the follow-up appointment.  Remote monitoring is used to monitor your Pacemaker from home. This monitoring reduces the number of office visits required to check your device to one time per year. It allows [place] to keep an eye on the functioning of your device to ensure it is working properly. You are scheduled for a device check from home on 06/14/2021. You may send your transmission at any time that day. If you have a wireless device, the transmission will be sent  automatically. After your physician reviews your transmission, you will receive a postcard with your next transmission date.

## 2021-03-19 NOTE — Progress Notes (Signed)
eart

## 2021-03-19 NOTE — Progress Notes (Signed)
Electrophysiology Office Follow up Visit Note:    Date:  03/19/2021   ID:  Joshua Moran., DOB 1946-01-08, MRN [id number]  PCP:  Lawerance Cruel, MD  [facility] Cardiologist:  [name], MD  [facility] Electrophysiologist:  Vickie Epley, MD    Interval History:    Joshua Moran. is a 75 y.o. male who presents for a follow up visit after a biventricular pacemaker implantation and AV nodal ablation on December 11, 2020.  Since the procedure he has done well.  He thinks his energy level is improved slightly.  He still does get fatigued with exertion.  Weight is stable.  He is with his wife today in clinic who I previously met.       Past Medical History:  Diagnosis Date   Abnormal screening CT of chest    coronary calcium in left main and RCA   Anemia    Arthritis    CHF (congestive heart failure) (HCC)    Complication of anesthesia    Pt reports difficult urinating   Coronary artery calcification seen on CAT scan    no ischemic on nuclear stress test 09/2019   DCM (dilated cardiomyopathy) ([place])    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  Cardiac MRI 2021 with EF 39% and moderate RV dysfunction and non-compaction   Dyspnea    with some activity   GERD (gastroesophageal reflux disease)    occasional   Glaucoma    Hyperlipidemia    statin intolerant at doses > Crestor 28m 3 times weekly   OSA on CPAP    PAF ([place]oxysmal [place] fibrillation) [place]    CHADS2VASC score 2 on apixaban    Past Surgical History:  Procedure Laterality Date   AV NODE ABLATION N/A 12/11/2020   Procedure: AV NODE ABLATION;  Surgeon: LVickie Epley MD;  [place];  Service: Cardiovascular;  Laterality: N/A;   BIV PACEMAKER INSERTION CRT-P N/A 12/11/2020   Procedure: BIV PACEMAKER INSERTION CRT-P;  Surgeon: LVickie Epley MD;  [place];  Service: Cardiovascular;  Laterality: N/A;   CARDIOVERSION N/A 04/01/2020   Procedure: CARDIOVERSION;   Surgeon: RSkeet Latch MD;  [place]  Service: Cardiovascular;  Laterality: N/A;   CARDIOVERSION N/A 09/08/2020   Procedure: CARDIOVERSION;  Surgeon: HPixie Casino MD;  [facility];  Service: Cardiovascular;  Laterality: N/A;   EYE SURGERY     bilateral cataracts   KNEE ARTHROSCOPY W/ DEBRIDEMENT     left    RETINAL DETACHMENT SURGERY Bilateral    RIGHT/LEFT HEART CATH AND CORONARY ANGIOGRAPHY N/A 05/06/2020   Procedure: RIGHT/LEFT HEART CATH AND CORONARY ANGIOGRAPHY;  Surgeon: MLarey Dresser MD;  [facility];  Service: Cardiovascular;  Laterality: N/A;   TOTAL KNEE ARTHROPLASTY Left 01/01/2018   Procedure: LEFT TOTAL KNEE ARTHROPLASTY;  Surgeon: AGaynelle Arabian MD;  Location: [facility];  Service: Orthopedics;  Laterality: Left;  50 mins   TOTAL KNEE ARTHROPLASTY Right 06/25/2018   Procedure: TOTAL KNEE ARTHROPLASTY;  Surgeon: AGaynelle Arabian MD;  Location: [facility];  Service: Orthopedics;  Laterality: Right;  535m   VASECTOMY      Current Medications: Current Meds  Medication Sig   acetaminophen (TYLENOL) 500 MG tablet Take 1,000 mg by mouth every 6 (six) hours as needed (PAIN).   apixaban (ELIQUIS) 5 MG TABS tablet Take 1 tablet (5 mg total) by mouth 2 (two) times daily.   brimonidine (ALPHAGAN) 0.15 %  ophthalmic solution Place 1 drop into the right eye at bedtime.   cetirizine (ZYRTEC) 10 MG tablet Take 10 mg by mouth at bedtime.   Cholecalciferol (VITAMIN D3) 50 MCG (2000 UT) TABS Take 2,000 Units by mouth daily.   dapagliflozin propanediol (FARXIGA) 10 MG TABS tablet Take 1 tablet (10 mg total) by mouth daily before breakfast.   ferrous sulfate 325 (65 FE) MG tablet Take 325 mg by mouth in the morning.   fluticasone (FLONASE) 50 MCG/ACT nasal spray Place 2 sprays into both nostrils 2 (two) times daily.    Glucosamine Sulfate 750 MG TABS Take 750 mg by mouth in the morning and at bedtime.   losartan (COZAAR) 25 MG tablet Take 0.5 tablets  (12.5 mg total) by mouth at bedtime.   Melatonin 10 MG TABS Take 10 mg by mouth at bedtime.    Multiple Vitamin (MULTIVITAMIN WITH MINERALS) TABS tablet Take 1 tablet by mouth every evening.   REPATHA SURECLICK 702 MG/ML SOAJ INJECT ONE pen into THE SKIN every 14 DAYS   rosuvastatin (CRESTOR) 5 MG tablet Take 5 mg by mouth every Monday, Wednesday, and Friday. IN THE EVENING   sildenafil (REVATIO) 20 MG tablet Take 40-100 mg by mouth daily as needed (erectile dysfunction.).    spironolactone (ALDACTONE) 25 MG tablet Take 1 tablet (25 mg total) by mouth at bedtime.   tamsulosin (FLOMAX) 0.4 MG CAPS capsule Take 0.4 mg by mouth at bedtime.      Allergies:   Xarelto [rivaroxaban], Empagliflozin, Penicillins, Statins, and Vancomycin   Social History   Socioeconomic History   Marital status: Married    Spouse name: Not on file   Number of children: Not on file   Years of education: Not on file   Highest education level: Not on file  Occupational History   Not on file  Tobacco Use   Smoking status: Never   Smokeless tobacco: Never  Vaping Use   Vaping Use: Never used  Substance and Sexual Activity   Alcohol use: Yes    Alcohol/week: 1.0 standard drink    Types: 1 Cans of beer per week    Comment: Rare   Drug use: No   Sexual activity: Not Currently  Other Topics Concern   Not on file  Social History Narrative   Not on file   Social Determinants of Health   Financial Resource Strain: Not on file  Food Insecurity: Not on file  Transportation Needs: Not on file  Physical Activity: Not on file  Stress: Not on file  Social Connections: Not on file     Family History: The patient's family history includes Lung cancer in his mother.  ROS:   Please see the history of present illness.    All other systems reviewed and are negative.  EKGs/Labs/Other Studies Reviewed:    The following studies were reviewed today:  March 19, 2021 in clinic device interrogation personally  reviewed Lead parameters are stable today Biventricular pacing 91%, 9% PVC burden Reprogram lead outputs to maximize battery longevity today.  EKG:  The ekg ordered today demonstrates atrial fibrillation, biventricular pacing, PVCs  Recent Labs: 12/09/2020: ALT 17; Hemoglobin 13.3; Platelets 214; TSH 4.140 03/05/2021: B Natriuretic Peptide 104.3; BUN 13; Creatinine, Ser 1.04; Potassium 4.8; Sodium 139  Recent Lipid Panel    Component Value Date/Time   CHOL 123 01/25/2021 1432   TRIG 102 01/25/2021 1432   HDL 47 01/25/2021 1432   CHOLHDL 2.6 01/25/2021 1432   CHOLHDL 3.0  05/20/2020 1632   VLDL 23 05/20/2020 1632   LDLCALC 57 01/25/2021 1432   LDLDIRECT 62 11/25/2019 1401    Physical Exam:    VS:  BP 128/70   Pulse 72   Ht _0  (1.803 m)   Wt 294 lb (133.4 kg)   BMI 41.00 kg/m     Wt Readings from Last 3 Encounters:  03/19/21 294 lb (133.4 kg)  03/05/21 295 lb 12.8 oz (134.2 kg)  01/25/21 295 lb (133.8 kg)     GEN:  Well nourished, well developed in no acute distress.  Obese HEENT: Normal NECK: No JVD; No carotid bruits LYMPHATICS: No lymphadenopathy CARDIAC: Irregular rhythm, no murmurs, rubs, gallops.  Pacemaker pocket well-healed RESPIRATORY:  Clear to auscultation without rales, wheezing or rhonchi  ABDOMEN: Soft, non-tender, non-distended MUSCULOSKELETAL:  No edema; No deformity  SKIN: Warm and dry NEUROLOGIC:  Alert and oriented x 3 PSYCHIATRIC:  Normal affect        ASSESSMENT:    1. Cardiac resynchronization therapy pacemaker (CRT-P) in place   2. DCM (dilated cardiomyopathy) ([place])   3. NICM (nonischemic cardiomyopathy) ([place])   4. Permanent atrial fibrillation (HCC)    PLAN:    In order of problems listed above:  #Chronic systolic heart failure secondary to nonischemic cardiomyopathy NYHA class II-III symptoms today.  Warm and dry on exam.  Continue Farxiga, losartan, spironolactone  #CRT-P in situ Device functioning appropriately.   Biventricular pacing percentage 91%.  He is having 9% PVCs.  We will need to keep a check on his PVC burden.  At this point, I do not think adding an antiarrhythmic drug is indicated.  #Permanent atrial fibrillation Continue Eliquis for stroke prophylaxis  #PVCs Continue to monitor burden.  No improvement on optimal medical therapy and biventricular pacing, would favor suppression.   Follow-up 9 months or sooner as needed.   Medication Adjustments/Labs and Tests Ordered: Current medicines are reviewed at length with the patient today.  Concerns regarding medicines are outlined above.  No orders of the defined types were placed in this encounter.  No orders of the defined types were placed in this encounter.    Signed, Lars Mage, MD, [facility], [facility] 03/19/2021 8:29 PM    Electrophysiology [facility]

## 2021-03-24 NOTE — Progress Notes (Signed)
Remote pacemaker transmission.   

## 2021-03-25 ENCOUNTER — Telehealth (HOSPITAL_COMMUNITY): Payer: Self-pay | Admitting: *Deleted

## 2021-03-25 NOTE — Telephone Encounter (Signed)
Pt called stating farxiga made him very dizzy so he stopped medication and does not want to restart it.  Pt said his bp dropped systolic bp 830'N. Pt asked if he needed to make any other changes.   Routed to [place]

## 2021-03-25 NOTE — Telephone Encounter (Signed)
Systolic BP of 600 is not bad.  Could try decreasing to 5 mg and taking in the evening.

## 2021-03-26 ENCOUNTER — Ambulatory Visit (HOSPITAL_COMMUNITY)
Admission: RE | Admit: 2021-03-26 | Discharge: 2021-03-26 | Disposition: A | Discharge status: Home / Self Care | Payer: PPO | Source: Ambulatory Visit | Attending: Cardiology | Admitting: Cardiology

## 2021-03-26 ENCOUNTER — Other Ambulatory Visit: Payer: Self-pay

## 2021-03-26 DIAGNOSIS — I420 Dilated cardiomyopathy: Secondary | ICD-10-CM | POA: Diagnosis not present

## 2021-03-26 LAB — BASIC METABOLIC PANEL
Anion gap: 7 (ref 5–15)
BUN: 9 mg/dL (ref 8–23)
CO2: 25 mmol/L (ref 22–32)
Calcium: 8.8 mg/dL — ABNORMAL LOW (ref 8.9–10.3)
Chloride: 105 mmol/L (ref 98–111)
Creatinine, Ser: 1.07 mg/dL (ref 0.61–1.24)
GFR, Estimated: >60 mL/min (ref >60)
Glucose, Bld: 102 mg/dL — ABNORMAL HIGH (ref 70–99)
Potassium: 4.5 mmol/L (ref 3.5–5.1)
Sodium: 137 mmol/L (ref 135–145)

## 2021-03-31 NOTE — Telephone Encounter (Signed)
Pt does not want to take farxiga at all.

## 2021-04-23 DIAGNOSIS — Z8669 Personal history of other diseases of the nervous system and sense organs: Secondary | ICD-10-CM | POA: Diagnosis not present

## 2021-04-23 DIAGNOSIS — H33033 Retinal detachment with giant retinal tear, bilateral: Secondary | ICD-10-CM | POA: Diagnosis not present

## 2021-04-23 DIAGNOSIS — Z8673 Personal history of transient ischemic attack (TIA), and cerebral infarction without residual deficits: Secondary | ICD-10-CM | POA: Diagnosis not present

## 2021-04-23 DIAGNOSIS — H5319 Other subjective visual disturbances: Secondary | ICD-10-CM | POA: Diagnosis not present

## 2021-04-23 DIAGNOSIS — Z87898 Personal history of other specified conditions: Secondary | ICD-10-CM | POA: Diagnosis not present

## 2021-04-23 DIAGNOSIS — H401114 Primary open-angle glaucoma, right eye, indeterminate stage: Secondary | ICD-10-CM | POA: Diagnosis not present

## 2021-04-26 NOTE — Telephone Encounter (Signed)
Pt left a vm asking if he needed to start another med or does Dr.mcLean need to see him in clinic since he was unable to stay on farxiga. He stopped farxiga about a month ago.  Routed to [place]

## 2021-04-26 NOTE — Telephone Encounter (Signed)
Pt aware and follow up scheduled.

## 2021-04-26 NOTE — Telephone Encounter (Signed)
Would just have him followup in office, if he could not tolerate Wilder Glade, I will not start him on Jardiance yet.

## 2021-05-10 ENCOUNTER — Other Ambulatory Visit (HOSPITAL_COMMUNITY): Payer: Self-pay | Admitting: Cardiology

## 2021-05-17 DIAGNOSIS — E7800 Pure hypercholesterolemia, unspecified: Secondary | ICD-10-CM | POA: Diagnosis not present

## 2021-05-17 DIAGNOSIS — M179 Osteoarthritis of knee, unspecified: Secondary | ICD-10-CM | POA: Diagnosis not present

## 2021-05-17 DIAGNOSIS — D649 Anemia, unspecified: Secondary | ICD-10-CM | POA: Diagnosis not present

## 2021-05-17 DIAGNOSIS — I5022 Chronic systolic (congestive) heart failure: Secondary | ICD-10-CM | POA: Diagnosis not present

## 2021-05-17 DIAGNOSIS — I639 Cerebral infarction, unspecified: Secondary | ICD-10-CM | POA: Diagnosis not present

## 2021-05-17 DIAGNOSIS — N401 Enlarged prostate with lower urinary tract symptoms: Secondary | ICD-10-CM | POA: Diagnosis not present

## 2021-05-17 DIAGNOSIS — I4891 Unspecified atrial fibrillation: Secondary | ICD-10-CM | POA: Diagnosis not present

## 2021-06-02 DIAGNOSIS — G4733 Obstructive sleep apnea (adult) (pediatric): Secondary | ICD-10-CM | POA: Diagnosis not present

## 2021-06-14 ENCOUNTER — Encounter (HOSPITAL_COMMUNITY): Payer: Self-pay | Admitting: Cardiology

## 2021-06-14 ENCOUNTER — Other Ambulatory Visit: Payer: Self-pay

## 2021-06-14 ENCOUNTER — Ambulatory Visit (INDEPENDENT_AMBULATORY_CARE_PROVIDER_SITE_OTHER): Payer: PPO

## 2021-06-14 ENCOUNTER — Ambulatory Visit (HOSPITAL_COMMUNITY)
Admission: RE | Admit: 2021-06-14 | Discharge: 2021-06-14 | Disposition: A | Discharge status: Home / Self Care | Payer: PPO | Source: Ambulatory Visit | Attending: Cardiology | Admitting: Cardiology

## 2021-06-14 VITALS — BP 150/92 | HR 70 | Ht 71.0 in | Wt 294.0 lb

## 2021-06-14 DIAGNOSIS — R42 Dizziness and giddiness: Secondary | ICD-10-CM | POA: Diagnosis not present

## 2021-06-14 DIAGNOSIS — Z79899 Other long term (current) drug therapy: Secondary | ICD-10-CM | POA: Diagnosis not present

## 2021-06-14 DIAGNOSIS — Z950 Presence of cardiac pacemaker: Secondary | ICD-10-CM | POA: Insufficient documentation

## 2021-06-14 DIAGNOSIS — I420 Dilated cardiomyopathy: Secondary | ICD-10-CM

## 2021-06-14 DIAGNOSIS — Z9989 Dependence on other enabling machines and devices: Secondary | ICD-10-CM | POA: Insufficient documentation

## 2021-06-14 DIAGNOSIS — I5022 Chronic systolic (congestive) heart failure: Secondary | ICD-10-CM | POA: Diagnosis not present

## 2021-06-14 DIAGNOSIS — I951 Orthostatic hypotension: Secondary | ICD-10-CM | POA: Insufficient documentation

## 2021-06-14 DIAGNOSIS — G4733 Obstructive sleep apnea (adult) (pediatric): Secondary | ICD-10-CM | POA: Diagnosis not present

## 2021-06-14 DIAGNOSIS — Z7901 Long term (current) use of anticoagulants: Secondary | ICD-10-CM | POA: Insufficient documentation

## 2021-06-14 DIAGNOSIS — I4819 Other persistent atrial fibrillation: Secondary | ICD-10-CM | POA: Insufficient documentation

## 2021-06-14 DIAGNOSIS — I480 Paroxysmal atrial fibrillation: Secondary | ICD-10-CM | POA: Insufficient documentation

## 2021-06-14 DIAGNOSIS — I2510 Atherosclerotic heart disease of native coronary artery without angina pectoris: Secondary | ICD-10-CM | POA: Diagnosis not present

## 2021-06-14 LAB — CUP PACEART REMOTE DEVICE CHECK
Battery Remaining Longevity: 92 mo
Battery Remaining Percentage: 94 %
Battery Voltage: 3.02 V
Date Time Interrogation Session: 20230109020011
Implantable Lead Implant Date: 20220708
Implantable Lead Implant Date: 20220708
Implantable Lead Location: 753858
Implantable Lead Location: 753860
Implantable Pulse Generator Implant Date: 20220708
Lead Channel Impedance Value: 630 Ohm
Lead Channel Impedance Value: 930 Ohm
Lead Channel Pacing Threshold Amplitude: 0.5 V
Lead Channel Pacing Threshold Amplitude: 1 V
Lead Channel Pacing Threshold Pulse Width: 0.5 ms
Lead Channel Pacing Threshold Pulse Width: 0.5 ms
Lead Channel Sensing Intrinsic Amplitude: 12 mV
Lead Channel Setting Pacing Amplitude: 2.5 V
Lead Channel Setting Pacing Amplitude: 2.5 V
Lead Channel Setting Pacing Pulse Width: 0.5 ms
Lead Channel Setting Pacing Pulse Width: 0.5 ms
Lead Channel Setting Sensing Sensitivity: 4 mV
Pulse Gen Model: 3562
Pulse Gen Serial Number: 3913632

## 2021-06-14 LAB — BASIC METABOLIC PANEL
Anion gap: 6 (ref 5–15)
BUN: 12 mg/dL (ref 8–23)
CO2: 24 mmol/L (ref 22–32)
Calcium: 8.7 mg/dL — ABNORMAL LOW (ref 8.9–10.3)
Chloride: 108 mmol/L (ref 98–111)
Creatinine, Ser: 0.98 mg/dL (ref 0.61–1.24)
GFR, Estimated: >60 mL/min (ref >60)
Glucose, Bld: 97 mg/dL (ref 70–99)
Potassium: 4 mmol/L (ref 3.5–5.1)
Sodium: 138 mmol/L (ref 135–145)

## 2021-06-14 MED ORDER — SPIRONOLACTONE 25 MG PO TABS: 25.0000 mg | ORAL_TABLET | Freq: Every day | ORAL | 3 refills | Status: DC

## 2021-06-14 MED ORDER — LOSARTAN POTASSIUM 25 MG PO TABS: 25.0000 mg | ORAL_TABLET | Freq: Every day | ORAL | 6 refills | Status: DC

## 2021-06-14 NOTE — Progress Notes (Signed)
PCP: Lawerance Cruel, MD Cardiology: Dr. Radford Pax HF Cardiology: Dr. Aundra Dubin  76 y.o. with history of chronic systolic CHF and paroxysmal atrial fibrillation was referred by Dr. Radford Pax for evaluation of CHF.  Patient has been short of breath for years, worse over the last 6 months.  He has had an extensive workup.  Echo in 3/21 showed EF 45-50% with mildly decreased RV function.  Cardiolite in 4/21 showed fixed apical defect.  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  PYP scan in 8/21 was not suggestive of TTR cardiac amyloidosis.   He had been in persistent atrial fibrillation for about 2 years.  He saw Dr. Quentin Ore and was started on amiodarone.  DCCV was done in 10/21 back to [facility].  He was found to have M-spike on SPEP.  He saw Dr. Marin Olp who thought this was MGUS.   LHC/RHC in 12/21 showed occluded small OM2 and 80-90% stenosis mid PLOM (small caliber), no interventional target; normal filling pressures and preserved cardiac output.   He was unable to tolerate Jardiance due to extreme dizziness. He is off both Coreg and Toprol XL due to lightheadedness/hypotension.   He developed recurrent atrial fibrillation and failed DCCV in 4/22.  He has been in persistent atrial fibrillation, Dr Quentin Ore stopped his amiodarone due to futility.  In 7/22, he had AV nodal ablation with St Jude CRT-P device placement.   Echo in 5/22 showed EF 35-40%, mild LV enlargement, normal RV.   Patient returns for followup of CHF.  He is using CPAP.  Mild dyspnea walking longer distances.  No change from prior appt.  No chest pain.  Still with episodes of lightheadedness if he does not take his time standing up. No syncope/presyncope or falls. He is wearing compression stockings. No claudication, no neuropathic symptoms.   Labs (7/21): Urine immunofixation with monoclonal IgG protein, SPEP with M-spike.   Labs (10/21): TSH normal, K 4,  creatinine 0.97, LFTs normal Labs (11/21): K 4.8, creatinine 1.07, hgb 14.7, LFTs normal, TSH normal Labs (12/21): LDL 66, HDL 45 Labs (1/22): K 3.9, creatinine 1.07 Labs (4/22): K 4.5, creatinine 1.14, TSH normal, hgb 13.8 Labs (7/22): K 4.5, creatinine 1.0 Labs (8/22): LDL 57 Labs (10/22): K 4.5, creatinine 1.07  St Jude device interrogation: stable thoracic impedance, 98% BiV pacing   PMH: 1. CVA: Occurred in 3/21, he was not on apixaban at the time.  2. GERD 3. OSA: Uses CPAP.  4. Hyperlipidemia 5. Atrial fibrillation: Paroxysmal.  - DCCV to NSR in 10/21.  - Failed DCCV in 4/22, stopped amiodarone.  - AV nodal ablation with St Jude CRT-P in 7/22.  6. MGUS 7. Chronic systolic CHF: Echo (7/82) with EF 45-50%, mild LVH, mildly decreased RV systolic function. Primarily nonischemic cardiomyopathy.  - Cardiolite (4/21): Fixed apical defect.  - Cardiac MRI (9/21): Mild LV dilation with mild LV hypertrophy, EF 39%, prominent trabeculations concerning for noncompaction. Mild RV dilation with EF 28%, severe LAE.  No delayed enhancement.  - PYP scan (8/21): grade 1, H/CL 1-1.5.  Unlikely to have cardiac amyloidosis.  - LHC/RHC (12/21): small OM2 totally occluded, 80-90% mid small PLOM (no interventional target); mean RA 5, PA 25/9, mean PCWP 5, CI 2.77 Fick/3.29 Thermo.  - St Jude CRT-P  8. Orthostatic hypotension.  9. CAD: Coronary angiography 12/21 with small OM2 totally occluded, 80-90% mid small PLOM (no interventional target) 10. Carotid ultrasound (8/22): mild  BICA stenosis.  11. COVID-19 9/22 12. PVCs  Social History   Socioeconomic History   Marital status: Married    Spouse name: Not on file   Number of children: Not on file   Years of education: Not on file   Highest education level: Not on file  Occupational History   Not on file  Tobacco Use   Smoking status: Never   Smokeless tobacco: Never  Vaping Use   Vaping Use: Never used  Substance and Sexual Activity    Alcohol use: Yes    Alcohol/week: 1.0 standard drink    Types: 1 Cans of beer per week    Comment: Rare   Drug use: No   Sexual activity: Not Currently  Other Topics Concern   Not on file  Social History Narrative   Not on file   Social Determinants of Health   Financial Resource Strain: Not on file  Food Insecurity: Not on file  Transportation Needs: Not on file  Physical Activity: Not on file  Stress: Not on file  Social Connections: Not on file  Intimate Partner Violence: Not on file   Family History  Problem Relation Age of Onset   Lung cancer Mother        smoker   ROS: All systems reviewed and negative except as per HPI.   Current Outpatient Medications  Medication Sig Dispense Refill   acetaminophen (TYLENOL) 500 MG tablet Take 1,000 mg by mouth every 6 (six) hours as needed (PAIN).     apixaban (ELIQUIS) 5 MG TABS tablet Take 1 tablet (5 mg total) by mouth 2 (two) times daily. 60 tablet 0   brimonidine (ALPHAGAN) 0.15 % ophthalmic solution Place 1 drop into the right eye at bedtime.     cetirizine (ZYRTEC) 10 MG tablet Take 10 mg by mouth at bedtime.     Cholecalciferol (VITAMIN D3) 50 MCG (2000 UT) TABS Take 2,000 Units by mouth daily.     ferrous sulfate 325 (65 FE) MG tablet Take 325 mg by mouth in the morning.     fluticasone (FLONASE) 50 MCG/ACT nasal spray Place 2 sprays into both nostrils 2 (two) times daily.      Glucosamine Sulfate 750 MG TABS Take 750 mg by mouth in the morning and at bedtime.     Melatonin 10 MG TABS Take 10 mg by mouth at bedtime.      Multiple Vitamin (MULTIVITAMIN WITH MINERALS) TABS tablet Take 1 tablet by mouth every evening.     REPATHA SURECLICK 704 MG/ML SOAJ INJECT ONE pen into THE SKIN every 14 DAYS 6 mL 3   rosuvastatin (CRESTOR) 5 MG tablet Take 5 mg by mouth every Monday, Wednesday, and Friday. IN THE EVENING     sildenafil (REVATIO) 20 MG tablet Take 40-100 mg by mouth daily as needed (erectile dysfunction.).   5    tamsulosin (FLOMAX) 0.4 MG CAPS capsule Take 0.4 mg by mouth at bedtime.      losartan (COZAAR) 25 MG tablet Take 1 tablet (25 mg total) by mouth at bedtime. 30 tablet 6   spironolactone (ALDACTONE) 25 MG tablet Take 1 tablet (25 mg total) by mouth at bedtime. 90 tablet 3   No current facility-administered medications for this encounter.   BP (!) 150/92    Pulse 70    Ht 5\' 11"  (1.803 m)    Wt 133.4 kg (294 lb)    SpO2 95%    BMI 41.00 kg/m  General: NAD Neck:  No JVD, no thyromegaly or thyroid nodule.  Lungs: Clear to auscultation bilaterally with normal respiratory effort. CV: Nondisplaced PMI.  Heart regular S1/S2, no S3/S4, no murmur.  No peripheral edema.  No carotid bruit.  Normal pedal pulses.  Abdomen: Soft, nontender, no hepatosplenomegaly, no distention.  Skin: Intact without lesions or rashes.  Neurologic: Alert and oriented x 3.  Psych: Normal affect. Extremities: No clubbing or cyanosis.  HEENT: Normal.   Assessment/Plan: 1. Chronic systolic CHF: Cardiac MRI in 9/21 with EF 39% and evidence for noncompaction cardiomyopathy, no delayed enhancement, significant RV dysfunction with RV EF 22%.  PYP scan was not suggestive of TTR amyloidosis and no symptoms or peripheral neuropathy.  12/21 LHC showed CAD but does not explain cardiomyopathy, no interventional target.  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  He has a history of orthostatic hypotension which has limited medication titration but this is doing better since AV nodal ablation with St Jude CRT-P.  NYHA class II.  He is not volume overloaded by Corvue or exam.   - He did not tolerate Jardiance or Farxiga due to dizziness.  - Continue spironolactone 25 mg daily.  - Increase losartan to 25 mg qhs. Suspect BP will not tolerate transition to Entresto. BMET today and in 10 days.  - Unable to tolerate Coreg or Toprol XL due to lightheadedness/low BP.  - Would consider addition of Verquvo in the future.   - Repeat echo at followup in 4 months.  2. Atrial fibrillation: Persistent.  He has severe LAE. Not good candidate for atrial fibrillation ablation per EP.  He failed DCCV on amiodarone in 4/22, amiodarone stopped.  AV nodal ablation with St Jude CRT-P in 7/22.  - Continue apixaban 5 mg bid.  3. OSA: Continue CPAP.  4. Orthostatic hypotension: Limits medications.   - Continue compression stockings and follow over time.   5. CAD: Moderate disease on cath in 12/21, no intervention target.  No chest pain.  Extent of disease does not explain cardiomyopathy.   - Continue Crestor and Repatha, good lipids in 8/22.  - He is on apixaban with stable CAD, so no ASA.   Followup in 4 months with echo.    Loralie Champagne 06/14/2021

## 2021-06-14 NOTE — Patient Instructions (Signed)
Medication Changes:  Take Spironolactone AT BEDTIME  Increase Losartan to 25 mg (1 tab) Daily AT BEDTIME  Lab Work:  Labs done today, your results will be available in MyChart, we will contact you for abnormal readings.  Testing/Procedures:  Your physician has requested that you have an echocardiogram. Echocardiography is a painless test that uses sound waves to create images of your heart. It provides your doctor with information about the size and shape of your heart and how well your hearts chambers and valves are working. This procedure takes approximately one hour. There are no restrictions for this procedure. IN 4 MONTHS  Referrals:  NONE  Special Instructions // Education:  Do the following things EVERYDAY: Weigh yourself in the morning before breakfast. Write it down and keep it in a log. Take your medicines as prescribed Eat low salt foods--Limit salt (sodium) to 2000 mg per day.  Stay as active as you can everyday Limit all fluids for the day to less than 2 liters  Follow-Up in: 4 months with echocardiogram  At the [facility], you and your health needs are our priority. We have a designated team specialized in the treatment of Heart Failure. This Care Team includes your primary Heart Failure Specialized Cardiologist (physician), Advanced Practice Providers (APPs- Physician Assistants and Nurse Practitioners), and Pharmacist who all work together to provide you with the care you need, when you need it.   You may see any of the following providers on your designated Care Team at your next follow up:  Dr Glori Bickers Dr Haynes Kerns, NP Lyda Jester, [place] [facility] [place], [place] Audry Riles, PharmD   Please be sure to bring in all your medications bottles to every appointment.   Need to Contact us:  If you have any questions or concerns before your next appointment please send [place] a message through [place] or call  our office at [phone].    TO LEAVE A MESSAGE FOR THE NURSE SELECT OPTION 2, PLEASE LEAVE A MESSAGE INCLUDING: YOUR NAME DATE OF BIRTH CALL BACK NUMBER REASON FOR CALL**this is important as we prioritize the call backs  YOU WILL RECEIVE A CALL BACK THE SAME DAY AS LONG AS YOU CALL BEFORE 4:00 PM

## 2021-06-15 DIAGNOSIS — J329 Chronic sinusitis, unspecified: Secondary | ICD-10-CM | POA: Diagnosis not present

## 2021-06-22 DIAGNOSIS — D122 Benign neoplasm of ascending colon: Secondary | ICD-10-CM | POA: Diagnosis not present

## 2021-06-22 DIAGNOSIS — D123 Benign neoplasm of transverse colon: Secondary | ICD-10-CM | POA: Diagnosis not present

## 2021-06-22 DIAGNOSIS — K5730 Diverticulosis of large intestine without perforation or abscess without bleeding: Secondary | ICD-10-CM | POA: Diagnosis not present

## 2021-06-22 DIAGNOSIS — Z86010 Personal history of colonic polyps: Secondary | ICD-10-CM | POA: Diagnosis not present

## 2021-06-22 DIAGNOSIS — D120 Benign neoplasm of cecum: Secondary | ICD-10-CM | POA: Diagnosis not present

## 2021-06-22 NOTE — Progress Notes (Signed)
Remote pacemaker transmission.   

## 2021-06-24 ENCOUNTER — Other Ambulatory Visit: Payer: Self-pay

## 2021-06-24 ENCOUNTER — Ambulatory Visit (HOSPITAL_COMMUNITY)
Admission: RE | Admit: 2021-06-24 | Discharge: 2021-06-24 | Disposition: A | Discharge status: Home / Self Care | Payer: PPO | Source: Ambulatory Visit | Attending: Internal Medicine | Admitting: Internal Medicine

## 2021-06-24 DIAGNOSIS — I420 Dilated cardiomyopathy: Secondary | ICD-10-CM | POA: Insufficient documentation

## 2021-06-24 LAB — BASIC METABOLIC PANEL
Anion gap: 8 (ref 5–15)
BUN: 9 mg/dL (ref 8–23)
CO2: 20 mmol/L — ABNORMAL LOW (ref 22–32)
Calcium: 8.3 mg/dL — ABNORMAL LOW (ref 8.9–10.3)
Chloride: 110 mmol/L (ref 98–111)
Creatinine, Ser: 1.05 mg/dL (ref 0.61–1.24)
GFR, Estimated: >60 mL/min (ref >60)
Glucose, Bld: 82 mg/dL (ref 70–99)
Potassium: 6.1 mmol/L — ABNORMAL HIGH (ref 3.5–5.1)
Sodium: 138 mmol/L (ref 135–145)

## 2021-06-25 DIAGNOSIS — D123 Benign neoplasm of transverse colon: Secondary | ICD-10-CM | POA: Diagnosis not present

## 2021-06-25 DIAGNOSIS — D120 Benign neoplasm of cecum: Secondary | ICD-10-CM | POA: Diagnosis not present

## 2021-06-25 DIAGNOSIS — D122 Benign neoplasm of ascending colon: Secondary | ICD-10-CM | POA: Diagnosis not present

## 2021-06-28 ENCOUNTER — Other Ambulatory Visit: Payer: Self-pay

## 2021-06-28 ENCOUNTER — Encounter (HOSPITAL_COMMUNITY): Payer: Self-pay | Admitting: Cardiology

## 2021-06-28 ENCOUNTER — Other Ambulatory Visit (HOSPITAL_COMMUNITY): Payer: Self-pay | Admitting: Cardiology

## 2021-06-28 ENCOUNTER — Ambulatory Visit (HOSPITAL_COMMUNITY)
Admission: RE | Admit: 2021-06-28 | Discharge: 2021-06-28 | Disposition: A | Discharge status: Home / Self Care | Payer: PPO | Source: Ambulatory Visit | Attending: Cardiology | Admitting: Cardiology

## 2021-06-28 DIAGNOSIS — I5042 Chronic combined systolic (congestive) and diastolic (congestive) heart failure: Secondary | ICD-10-CM

## 2021-06-28 LAB — BASIC METABOLIC PANEL WITH GFR
Anion gap: 6 (ref 5–15)
BUN: 10 mg/dL (ref 8–23)
CO2: 26 mmol/L (ref 22–32)
Calcium: 8.6 mg/dL — ABNORMAL LOW (ref 8.9–10.3)
Chloride: 108 mmol/L (ref 98–111)
Creatinine, Ser: 0.97 mg/dL (ref 0.61–1.24)
GFR, Estimated: >60 mL/min (ref >60)
Glucose, Bld: 86 mg/dL (ref 70–99)
Potassium: 4.8 mmol/L (ref 3.5–5.1)
Sodium: 140 mmol/L (ref 135–145)

## 2021-07-12 DIAGNOSIS — H401113 Primary open-angle glaucoma, right eye, severe stage: Secondary | ICD-10-CM | POA: Diagnosis not present

## 2021-07-20 DIAGNOSIS — L538 Other specified erythematous conditions: Secondary | ICD-10-CM | POA: Diagnosis not present

## 2021-07-20 DIAGNOSIS — D485 Neoplasm of uncertain behavior of skin: Secondary | ICD-10-CM | POA: Diagnosis not present

## 2021-07-20 DIAGNOSIS — C4442 Squamous cell carcinoma of skin of scalp and neck: Secondary | ICD-10-CM | POA: Diagnosis not present

## 2021-07-22 ENCOUNTER — Ambulatory Visit: Payer: PPO | Admitting: Adult Health

## 2021-07-22 ENCOUNTER — Encounter: Payer: Self-pay | Admitting: Adult Health

## 2021-07-22 ENCOUNTER — Telehealth: Payer: Self-pay | Admitting: Adult Health

## 2021-07-22 VITALS — BP 106/65 | HR 71 | Ht 71.0 in | Wt 294.0 lb

## 2021-07-22 DIAGNOSIS — G5601 Carpal tunnel syndrome, right upper limb: Secondary | ICD-10-CM

## 2021-07-22 DIAGNOSIS — R42 Dizziness and giddiness: Secondary | ICD-10-CM

## 2021-07-22 DIAGNOSIS — I639 Cerebral infarction, unspecified: Secondary | ICD-10-CM

## 2021-07-22 NOTE — Progress Notes (Signed)
[facility] [address] [place]. [place] [address] [phone]       STROKE FOLLOW UP NOTE  [name]. Date of Birth:  09-10-1945 Medical Record Number:  [id number]   Reason for visit: Vertigo    CHIEF COMPLAINT:  Chief Complaint  Patient presents with   Follow-up    RM 2  with spouse Joshua Moran Pt is well and stable, states he is doing fine. No worsening symptoms     HPI:  Update 07/22/2021 JM: Patient returns for 80-month follow-up accompanied by his wife.  Overall stable since prior visit.  Reports continued chronic dizziness nearly every morning upon awakening and typically lasts until early afternoon. This is not new nor worsened. Reports vertigo type symptoms approx 1 every 6 months which can last anywhere from 1 hour up to 2 days. He has not yet seen ENT after prior visit with Dr. Leonie Moran -previously seen by Dr. Lucia Moran who has since retired.  Was seen by neuro-ophthalmology Dr. Hassell Moran for concern of intermittent  oscillopsia - per patient, was told his vertigo symptoms likely in setting of crystals in his ears.  He also mentions occassional right hand numbness in certain positions such as when holding his kindle in bed and at times waking up in the middle of the night - this will quickly resolve after position change. Unable to state which fingers but does initially point to thumb and pointer finger area. denies any weakness or dexterity issues.  Does not interfere with daytime activity. Does not radiate past wrist.   Otherwise, stable from stroke standpoint without new stroke/TIA symptoms.  Compliant on Eliquis and Repatha and Crestor 3 times weekly without side effects.  Prior LDL 57 (01/2021).  Blood pressure today 106/65.  Occasionally monitors at home and typically stable.  No further concerns at this time.    History provided for reference purposes only Update 01/25/2021 Dr. Leonie Moran He returns for follow-up after last visit with Joshua Moran our nurse  practitioner 3 months ago.  He is accompanied by his wife.  Patient states his dizziness is not as bad and is improved somewhat especially after he had his pacemaker recently.  He still occasionally once every month or 2 as an episode where he feels objects moving slightly to the left.  If he sits down quietly and closes eyes for a few minutes this usually gets better.  Occasionally gets dizziness and vertigo when he moves but not constantly.  He has had 1 fall since last visit.  He does admit to longstanding ringing sensation in his ears for several years as well as slight decrease.  He was last seen by ENT physician Dr. Lucia Moran 3 months ago but he did not discuss these complaints with him.  He does have an upcoming appointment to see neuro-ophthalmologist Dr. Hassell Moran in a few months.  He has had no recurrent stroke or TIA symptoms.  He remains on Eliquis which is tolerating well without bruising or bleeding.  Blood pressure is well controlled today it is 115/79.  His blood sugars have all been good.  He remains on Repatha for his hyperlipidemia as well as on Crestor 3 days a week.  He has not had follow-up lipid profile checked in quite a while   Update 10/15/2020 JM: Joshua Moran returns for acute visit accompanied by his wife due to recent onset of vertigo different from his dizziness typically related to low blood pressure.    Reports longstanding history of episodes at  least over the past couple of years.  Can occur every couple of months and at times can last for only 5 minutes but other times can last for couple of days.  Describes episode as objects in his environment will be moving such as a poster on the wall or the words on a book. At times he can stop the symptoms when he closes his eyes for a few minutes.  Denies double or blurred vision or room spinning type sensation.  Denies symptoms with quick head movement or position changes.  Denies associated headache or N/V or weakness, numbness, speech changes  or cognitive changes. chronic imbalance with worsening imbalance during this time.  Chronic hearing loss and tinnitus as well as chronic sinus issues routinely followed by ENT.  History of retinal detachment s/p repair 2005 and 2008.   Update 07/20/2020 JM: Joshua Moran returns for 14-month stroke follow-up accompanied by his wife.  He has been doing well since prior visit without new stroke/TIA symptoms.  He does c/o dizziness especially after initiating Jardiance and has improved since discontinuing but will continue to have occasional dizziness in relation to lower blood pressure readings.  He continues to follow with cardiology routinely for blood pressure management as this has been difficult to control.  Blood pressure today 106/72.  Reports compliance with Eliquis, Repatha and Crestor tolerating without side effects.  Reports ongoing compliance with CPAP for OSA management.  No further concerns at this time.  Update 01/15/2020 JM: Joshua Moran returns for stroke follow-up accompanied by his wife He has been stable without residual stroke deficits and denies new or recurring stroke/TIA symptoms He does report intermittent occipital headaches that have been present since his stroke and typically subside with use of Tylenol.  Headaches are not debilitating.  Denies visual changes or photophobia, phonophobia or nausea/vomiting Remains on Eliquis for secondary stroke prevention history of A. fib without bleeding or bruising Initiating Repatha in addition to Crestor for HLD management with history of statin intolerance managed by cardiology.  Recent lipid panel 11/25/2019 showed LDL 60 Blood pressure today 112/78 Diagnosed with sleep apnea and reports ongoing use of CPAP No concerns at this time  Update 09/11/2019 JM: Joshua Moran is being seen for hospital follow-up complaint by his wife.  He has recovered well from a stroke standpoint without residual deficits or new/reoccurring stroke/TIAs.  He continues on  Eliquis without bleeding or bruising.  Continues on Crestor 5 mg 3 times weekly but recently referred to lipid clinic for potential initiation of PCSK9 inhibitor with history of statin intolerance.  Blood pressure today 102/64 and reports occasional episodes of lower blood pressure with dizziness and feels as though related to dehydration. Blood pressure at home typically 120-130/80s.  He also plans on undergoing split-night study for possible sleep apnea at the end of this month.  Continues to follow with cardiology and PCP regularly for chronic disease management.  No concerns at this time.  Stroke admission 08/06/2019: Mr. Sabatino Williard. is a 76 y.o. male with history of atrial fibrillation not on [facility] d/t low CHADSVASC score who presented on 08/06/2019 with recurrent aphasia.  Evaluated by stroke team and Dr. Leonie Moran with stroke work-up revealing left frontal lobe infarct embolic pattern in setting of known AF not on AC.  Recommend initiating Eliquis for secondary stroke prevention with CHA2DS2-VASc score 3 (prior 1).  LDL 106 previously on Crestor with history of multiple statin intolerances and recommended consideration of PCSK9 at outpatient follow-up for optimal  cholesterol control.  No prior history of HTN or DM.  No prior history of stroke.  Discharged home in stable condition without therapy needs.  Stroke:   L frontal lobe infarct embolic in setting of known AF not on [facility] Code Stroke CT head No acute abnormality. ASPECTS 10.    CTA head & neck no LVO, mild atherosclerosis head and neck. 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 Sinus dz 2D Echo ejection fraction 45 to 50%.  Mild left ventricular hypertrophy.  No cardiac source of embolism.   LDL 106 HgbA1c 5.8 Eliquis for VTE prophylaxis aspirin 81 mg daily prior to admission, now on Eliquis (apixaban) daily. 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  Cardiac MRI 2021 with EF 39% and moderate RV dysfunction and non-compaction   Dyspnea    with some activity   GERD (gastroesophageal reflux disease)    occasional   Glaucoma    Hyperlipidemia    statin intolerant at doses > Crestor 5mg  3 times weekly   OSA on CPAP    PAF (paroxysmal atrial fibrillation) ([place])    CHADS2VASC score 2 on apixaban    PSH:  Past Surgical History:  Procedure Laterality Date   AV NODE ABLATION N/A 12/11/2020   Procedure: AV NODE ABLATION;  Surgeon: Vickie Epley, MD;  Location: [facility];  Service: Cardiovascular;  Laterality: N/A;   BIV PACEMAKER INSERTION CRT-P N/A 12/11/2020   Procedure: BIV PACEMAKER INSERTION CRT-P;  Surgeon: Vickie Epley, MD;  Location: [facility];  Service: Cardiovascular;  Laterality: N/A;   CARDIOVERSION N/A 04/01/2020   Procedure: CARDIOVERSION;  Surgeon: Skeet Latch, MD;  Location: [facility];  Service: Cardiovascular;  Laterality: N/A;   CARDIOVERSION N/A 09/08/2020   Procedure: CARDIOVERSION;  Surgeon: Pixie Casino, MD;  Location: [facility];  Service: Cardiovascular;  Laterality: N/A;   EYE SURGERY     bilateral cataracts   KNEE ARTHROSCOPY W/ DEBRIDEMENT     left    RETINAL DETACHMENT SURGERY Bilateral    RIGHT/LEFT HEART CATH AND CORONARY ANGIOGRAPHY N/A 05/06/2020   Procedure: RIGHT/LEFT HEART CATH AND CORONARY  ANGIOGRAPHY;  Surgeon: Larey Dresser, MD;  Location: [facility];  Service: Cardiovascular;  Laterality: N/A;   TOTAL KNEE ARTHROPLASTY Left 01/01/2018   Procedure: LEFT TOTAL KNEE ARTHROPLASTY;  Surgeon: Gaynelle Arabian, MD;  Location: [facility];  Service: Orthopedics;  Laterality: Left;  50 mins   TOTAL KNEE ARTHROPLASTY Right 06/25/2018   Procedure: TOTAL KNEE ARTHROPLASTY;  Surgeon: Gaynelle Arabian, MD;  Location: [facility];  Service: Orthopedics;  Laterality: Right;  42min   VASECTOMY      Social History:  Social History   Socioeconomic  History   Marital status: Married    Spouse name: Not on file   Number of children: Not on file   Years of education: Not on file   Highest education level: Not on file  Occupational History   Not on file  Tobacco Use   Smoking status: Never   Smokeless tobacco: Never  Vaping Use   Vaping Use: Never used  Substance and Sexual Activity   Alcohol use: Yes    Alcohol/week: 1.0 standard drink    Types: 1 Cans of beer per week    Comment: Rare   Drug use: No   Sexual activity: Not Currently  Other Topics Concern   Not on file  Social History Narrative   Not on file   Social Determinants of Health   Financial Resource Strain: Not on file  Food Insecurity: Not on file  Transportation Needs: Not on file  Physical Activity: Not on file  Stress: Not on file  Social Connections: Not on file  Intimate Partner Violence: Not on file    Family History:  Family History  Problem Relation Age of Onset   Lung cancer Mother        smoker    Medications:   Current Outpatient Medications on File Prior to Visit  Medication Sig Dispense Refill   acetaminophen (TYLENOL) 500 MG tablet Take 1,000 mg by mouth every 6 (six) hours as needed (PAIN).     apixaban (ELIQUIS) 5 MG TABS tablet Take 1 tablet (5 mg total) by mouth 2 (two) times daily. 60 tablet 0   brimonidine (ALPHAGAN) 0.15 % ophthalmic solution Place 1 drop into the right eye at bedtime.      cetirizine (ZYRTEC) 10 MG tablet Take 10 mg by mouth at bedtime.     Cholecalciferol (VITAMIN D3) 50 MCG (2000 UT) TABS Take 2,000 Units by mouth daily.     ferrous sulfate 325 (65 FE) MG tablet Take 325 mg by mouth in the morning.     fluticasone (FLONASE) 50 MCG/ACT nasal spray Place 2 sprays into both nostrils 2 (two) times daily.      Glucosamine Sulfate 750 MG TABS Take 750 mg by mouth in the morning and at bedtime.     losartan (COZAAR) 25 MG tablet Take 1 tablet (25 mg total) by mouth at bedtime. 30 tablet 6   Melatonin 10 MG TABS Take 10 mg by mouth at bedtime.      Multiple Vitamin (MULTIVITAMIN WITH MINERALS) TABS tablet Take 1 tablet by mouth every evening.     REPATHA SURECLICK 324 MG/ML SOAJ INJECT ONE pen into THE SKIN every 14 DAYS 6 mL 3   rosuvastatin (CRESTOR) 5 MG tablet Take 5 mg by mouth every Monday, Wednesday, and Friday. IN THE EVENING     sildenafil (REVATIO) 20 MG tablet Take 40-100 mg by mouth daily as needed (erectile dysfunction.).   5   spironolactone (ALDACTONE) 25 MG tablet Take 1 tablet (25 mg total) by mouth at bedtime. 90 tablet 3   tamsulosin (FLOMAX) 0.4 MG CAPS capsule Take 0.4 mg by mouth at bedtime.      No current facility-administered medications on file prior to visit.    Allergies:   Allergies  Allergen Reactions   Xarelto [Rivaroxaban] Rash    Dizziness    Empagliflozin Other (See Comments)    (JARDIANCE) dizziness   Penicillins Other (See Comments)    Very flushed  Has patient had a PCN reaction causing immediate rash, facial/tongue/throat  swelling, SOB or lightheadedness with hypotension: No Has patient had a PCN reaction causing severe rash involving mucus membranes or skin necrosis: No Has patient had a PCN reaction that required hospitalization No Has patient had a PCN reaction occurring within the last 10 years: No If all of the above answers are "NO", then may proceed with Cephalosporin use.    Statins Other (See Comments)    Pt  c/o muscle aches with use of statins.   Vancomycin Rash     Physical Exam  Vitals:   07/22/21 0809  BP: 106/65  Pulse: 71  Weight: 294 lb (133.4 kg)  Height: 5\' 11"  (1.803 m)    Body mass index is 41 kg/m. No results found.  General: well developed, well nourished, pleasant elderly Caucasian male, seated, in no evident distress Head: head normocephalic and atraumatic.   Neck: supple with no carotid or supraclavicular bruits Cardiovascular: regular rate and rhythm, no murmurs Musculoskeletal: no deformity Skin:  no rash/petichiae Vascular:  Normal pulses all extremities   Neurologic Exam Mental Status: Awake and fully alert. Normal speech and language. Oriented to place and time. Recent and remote memory intact. Attention span, concentration and fund of knowledge appropriate. Mood and affect appropriate.  Cranial Nerves: Pupils equal, briskly reactive to light. Extraocular movements full without nystagmus. Visual fields full to confrontation. Right eye ptosis - hx of detached retina. Hearing intact. Facial sensation intact. Face, tongue, palate moves normally and symmetrically.  Motor: Normal bulk and tone. Normal strength in all tested extremity muscles. Sensory.: intact to touch , pinprick , position and vibratory sensation. Negative Phalen and Tinel's sign over right wrist - no sensory changes on exam Coordination: Rapid alternating movements normal in all extremities. Finger-to-nose and heel-to-shin performed accurately bilaterally. Gait and Station: Arises from chair without difficulty. Stance is normal. Gait demonstrates normal stride length and balance without use of assistive device. Mild difficulty with tandem walk and heel toe. Romberg negative.  Reflexes: 1+ and symmetric. Toes downgoing.      ASSESSMENT: Deny Chevez. is a 76 y.o. year old male presented with aphasia on 08/06/2019 with stroke work-up revealing left frontal lobe infarct embolic pattern secondary to  known AF not on [facility] therefore Eliquis initiated. Vascular risk factors include HLD, OSA on CPAP and A. Fib. Longstanding complaints of intermittent oscillopsia and dizziness of unclear etiology.  He may have a component of underlying Mnire's disease given history of chronic longstanding tinnitus, hearing loss and vertigo    PLAN:  vertigo: Referral placed to ENT to evaluate for Mnire's -previously seen by Dr. Lucia Moran but since retired before this concern could be discussed Currently stable - only occurs once every 6 months Discussed potential benefit with vestibular rehab if BPPV related - he will consider in the future if needed  Likely mild R CTS: Discussed conservative measures with use of braces and avoidance of position that worsen symptoms. As symptoms not severe and position dependent, will continue to monitor. Can consider EMG/NCV if the future if symptoms progress  Left frontal lobe stroke: Recovered well without residual deficits Continue Eliquis (apixaban) daily  and Crestor and Repatha.   Ensure close PCP and cardiology follow-up for aggressive stroke risk factor management including HLD with LDL goal<70 (LDL 57 01/2021) and atrial fibrillation on Eliquis Continue nightly use of CPAP for OSA management managed by cardiology    Overall stable from stroke and neurological standpoint - follow up as needed    CC:  Lawerance Cruel,  MD     I spent 34 minutes of face-to-face and non-face-to-face time with patient and wife.  This included previsit chart review, lab review, study review, order entry, electronic health record documentation, patient and wife education and discussion regarding above diagnoses and all related questions and additional questions answered to patient and wife satisfaction   Frann Rider, AGNP-BC  [facility] [address] [place], [place] [address]  Phone [phone] Fax [phone] Note: This document was  prepared with digital dictation and possible smart phrase technology. Any transcriptional errors that result from this process are unintentional.

## 2021-07-22 NOTE — Telephone Encounter (Signed)
Referral sent to Dr. Teoh [phone]. ?

## 2021-07-28 DIAGNOSIS — R0981 Nasal congestion: Secondary | ICD-10-CM | POA: Diagnosis not present

## 2021-07-28 DIAGNOSIS — E291 Testicular hypofunction: Secondary | ICD-10-CM | POA: Diagnosis not present

## 2021-07-28 DIAGNOSIS — J0191 Acute recurrent sinusitis, unspecified: Secondary | ICD-10-CM | POA: Diagnosis not present

## 2021-07-28 DIAGNOSIS — N401 Enlarged prostate with lower urinary tract symptoms: Secondary | ICD-10-CM | POA: Diagnosis not present

## 2021-07-28 DIAGNOSIS — R059 Cough, unspecified: Secondary | ICD-10-CM | POA: Diagnosis not present

## 2021-07-29 ENCOUNTER — Ambulatory Visit: Payer: PPO | Admitting: Adult Health

## 2021-08-04 DIAGNOSIS — R972 Elevated prostate specific antigen [PSA]: Secondary | ICD-10-CM | POA: Diagnosis not present

## 2021-08-04 DIAGNOSIS — N5201 Erectile dysfunction due to arterial insufficiency: Secondary | ICD-10-CM | POA: Diagnosis not present

## 2021-08-04 DIAGNOSIS — E291 Testicular hypofunction: Secondary | ICD-10-CM | POA: Diagnosis not present

## 2021-08-04 DIAGNOSIS — R351 Nocturia: Secondary | ICD-10-CM | POA: Diagnosis not present

## 2021-08-04 DIAGNOSIS — N401 Enlarged prostate with lower urinary tract symptoms: Secondary | ICD-10-CM | POA: Diagnosis not present

## 2021-08-10 ENCOUNTER — Telehealth (HOSPITAL_COMMUNITY): Payer: Self-pay | Admitting: *Deleted

## 2021-08-10 DIAGNOSIS — I5042 Chronic combined systolic (congestive) and diastolic (congestive) heart failure: Secondary | ICD-10-CM

## 2021-08-10 MED ORDER — EPLERENONE 25 MG PO TABS
25.0000 mg | ORAL_TABLET | Freq: Every day | ORAL | 3 refills | Status: DC
Start: 2021-08-10 — End: 2021-10-12

## 2021-08-10 NOTE — Telephone Encounter (Signed)
-----   Message from Larey Dresser, MD sent at 08/09/2021  8:20 PM EST ----- ?Can change to eplerenone 25 daily ?----- Message ----- ?From: Scarlette Calico, RN ?Sent: 08/09/2021   5:06 PM EST ?To: Larey Dresser, MD, [facility] Triage Pool ? ?Dr Aundra Dubin he is on spiro 25 and has a lot of dizziness off/on, do you want to do eplerenone 50 or 25 mg? ? ?----- Message ----- ?From: Larey Dresser, MD ?Sent: 08/04/2021  10:15 AM EST ?To: [facility] Triage Pool ? ?Mr Vroom has breast tenderness with spironolactone.  Please have him stop spironolactone, then 1 week later have him start on eplerenone 50 mg daily. He will need BMET 1 week after starting eplerenone.  ? ?----- Message ----- ?From: Irine Seal, MD ?Sent: 08/04/2021  10:02 AM EST ?To: Larey Dresser, MD ? ?Mr. Harr has been dealing with some breast tenderness for a few months.  He had previously been on finasteride which can cause that but the tenderness has persisted despite stopping the finasteride a few months ago.   I realized that spironolactone can also cause breast tenderness and I believe you are prescribing that for him.  If there is an alternative, it might be worthwhile to give it a try.  ? ? ? ? ?

## 2021-08-10 NOTE — Telephone Encounter (Signed)
Pt aware and agreeable, new rx for eplerenone 25 sent in, pt will not start until next week, repeat labs sch for 3/21 ?

## 2021-08-12 ENCOUNTER — Other Ambulatory Visit: Payer: Self-pay | Admitting: Cardiology

## 2021-08-12 DIAGNOSIS — E785 Hyperlipidemia, unspecified: Secondary | ICD-10-CM

## 2021-08-12 DIAGNOSIS — I2510 Atherosclerotic heart disease of native coronary artery without angina pectoris: Secondary | ICD-10-CM

## 2021-08-13 DIAGNOSIS — E7800 Pure hypercholesterolemia, unspecified: Secondary | ICD-10-CM | POA: Diagnosis not present

## 2021-08-13 DIAGNOSIS — N401 Enlarged prostate with lower urinary tract symptoms: Secondary | ICD-10-CM | POA: Diagnosis not present

## 2021-08-13 DIAGNOSIS — I5022 Chronic systolic (congestive) heart failure: Secondary | ICD-10-CM | POA: Diagnosis not present

## 2021-08-13 DIAGNOSIS — I4891 Unspecified atrial fibrillation: Secondary | ICD-10-CM | POA: Diagnosis not present

## 2021-08-24 ENCOUNTER — Other Ambulatory Visit: Payer: Self-pay

## 2021-08-24 ENCOUNTER — Ambulatory Visit (HOSPITAL_COMMUNITY)
Admission: RE | Admit: 2021-08-24 | Discharge: 2021-08-24 | Disposition: A | Discharge status: Home / Self Care | Payer: PPO | Source: Ambulatory Visit | Attending: Cardiology | Admitting: Cardiology

## 2021-08-24 DIAGNOSIS — I5042 Chronic combined systolic (congestive) and diastolic (congestive) heart failure: Secondary | ICD-10-CM | POA: Diagnosis not present

## 2021-08-24 LAB — BASIC METABOLIC PANEL
Anion gap: 5 (ref 5–15)
BUN: 15 mg/dL (ref 8–23)
CO2: 23 mmol/L (ref 22–32)
Calcium: 8.5 mg/dL — ABNORMAL LOW (ref 8.9–10.3)
Chloride: 108 mmol/L (ref 98–111)
Creatinine, Ser: 0.99 mg/dL (ref 0.61–1.24)
GFR, Estimated: >60 mL/min (ref >60)
Glucose, Bld: 103 mg/dL — ABNORMAL HIGH (ref 70–99)
Potassium: 3.8 mmol/L (ref 3.5–5.1)
Sodium: 136 mmol/L (ref 135–145)

## 2021-09-07 DIAGNOSIS — U099 Post covid-19 condition, unspecified: Secondary | ICD-10-CM | POA: Diagnosis not present

## 2021-09-07 DIAGNOSIS — J329 Chronic sinusitis, unspecified: Secondary | ICD-10-CM | POA: Diagnosis not present

## 2021-09-07 DIAGNOSIS — R2689 Other abnormalities of gait and mobility: Secondary | ICD-10-CM | POA: Diagnosis not present

## 2021-09-07 DIAGNOSIS — R053 Chronic cough: Secondary | ICD-10-CM | POA: Diagnosis not present

## 2021-09-07 DIAGNOSIS — G4733 Obstructive sleep apnea (adult) (pediatric): Secondary | ICD-10-CM | POA: Diagnosis not present

## 2021-09-13 ENCOUNTER — Ambulatory Visit (INDEPENDENT_AMBULATORY_CARE_PROVIDER_SITE_OTHER): Payer: PPO

## 2021-09-13 DIAGNOSIS — I420 Dilated cardiomyopathy: Secondary | ICD-10-CM

## 2021-09-14 LAB — CUP PACEART REMOTE DEVICE CHECK
Battery Remaining Longevity: 90 mo
Battery Remaining Percentage: 92 %
Battery Voltage: 3.02 V
Date Time Interrogation Session: 20230410020009
Implantable Lead Implant Date: 20220708
Implantable Lead Implant Date: 20220708
Implantable Lead Location: 753858
Implantable Lead Location: 753860
Implantable Pulse Generator Implant Date: 20220708
Lead Channel Impedance Value: 610 Ohm
Lead Channel Impedance Value: 910 Ohm
Lead Channel Pacing Threshold Amplitude: 0.5 V
Lead Channel Pacing Threshold Amplitude: 1 V
Lead Channel Pacing Threshold Pulse Width: 0.5 ms
Lead Channel Pacing Threshold Pulse Width: 0.5 ms
Lead Channel Sensing Intrinsic Amplitude: 12 mV
Lead Channel Setting Pacing Amplitude: 2.5 V
Lead Channel Setting Pacing Amplitude: 2.5 V
Lead Channel Setting Pacing Pulse Width: 0.5 ms
Lead Channel Setting Pacing Pulse Width: 0.5 ms
Lead Channel Setting Sensing Sensitivity: 4 mV
Pulse Gen Model: 3562
Pulse Gen Serial Number: 3913632

## 2021-09-27 DIAGNOSIS — Z85828 Personal history of other malignant neoplasm of skin: Secondary | ICD-10-CM | POA: Diagnosis not present

## 2021-09-27 DIAGNOSIS — Z08 Encounter for follow-up examination after completed treatment for malignant neoplasm: Secondary | ICD-10-CM | POA: Diagnosis not present

## 2021-09-27 DIAGNOSIS — L7211 Pilar cyst: Secondary | ICD-10-CM | POA: Diagnosis not present

## 2021-09-27 DIAGNOSIS — L570 Actinic keratosis: Secondary | ICD-10-CM | POA: Diagnosis not present

## 2021-09-27 DIAGNOSIS — L821 Other seborrheic keratosis: Secondary | ICD-10-CM | POA: Diagnosis not present

## 2021-09-27 DIAGNOSIS — L814 Other melanin hyperpigmentation: Secondary | ICD-10-CM | POA: Diagnosis not present

## 2021-09-27 DIAGNOSIS — D1801 Hemangioma of skin and subcutaneous tissue: Secondary | ICD-10-CM | POA: Diagnosis not present

## 2021-09-27 DIAGNOSIS — Z09 Encounter for follow-up examination after completed treatment for conditions other than malignant neoplasm: Secondary | ICD-10-CM | POA: Diagnosis not present

## 2021-09-28 NOTE — Progress Notes (Signed)
Remote pacemaker transmission.   

## 2021-10-12 ENCOUNTER — Encounter (HOSPITAL_COMMUNITY): Payer: Self-pay | Admitting: Cardiology

## 2021-10-12 ENCOUNTER — Ambulatory Visit (HOSPITAL_BASED_OUTPATIENT_CLINIC_OR_DEPARTMENT_OTHER)
Admission: RE | Admit: 2021-10-12 | Discharge: 2021-10-12 | Disposition: A | Discharge status: Home / Self Care | Payer: PPO | Source: Ambulatory Visit | Attending: Cardiology | Admitting: Cardiology

## 2021-10-12 ENCOUNTER — Ambulatory Visit (HOSPITAL_COMMUNITY)
Admission: RE | Admit: 2021-10-12 | Discharge: 2021-10-12 | Disposition: A | Discharge status: Home / Self Care | Payer: PPO | Source: Ambulatory Visit | Attending: Cardiology | Admitting: Cardiology

## 2021-10-12 VITALS — BP 124/80 | HR 74 | Wt 290.4 lb

## 2021-10-12 DIAGNOSIS — E785 Hyperlipidemia, unspecified: Secondary | ICD-10-CM | POA: Diagnosis not present

## 2021-10-12 DIAGNOSIS — I4821 Permanent atrial fibrillation: Secondary | ICD-10-CM | POA: Diagnosis not present

## 2021-10-12 DIAGNOSIS — I2510 Atherosclerotic heart disease of native coronary artery without angina pectoris: Secondary | ICD-10-CM | POA: Insufficient documentation

## 2021-10-12 DIAGNOSIS — I951 Orthostatic hypotension: Secondary | ICD-10-CM | POA: Diagnosis not present

## 2021-10-12 DIAGNOSIS — G4733 Obstructive sleep apnea (adult) (pediatric): Secondary | ICD-10-CM | POA: Diagnosis not present

## 2021-10-12 DIAGNOSIS — Z95810 Presence of automatic (implantable) cardiac defibrillator: Secondary | ICD-10-CM | POA: Insufficient documentation

## 2021-10-12 DIAGNOSIS — I5022 Chronic systolic (congestive) heart failure: Secondary | ICD-10-CM | POA: Diagnosis not present

## 2021-10-12 DIAGNOSIS — Z79899 Other long term (current) drug therapy: Secondary | ICD-10-CM | POA: Insufficient documentation

## 2021-10-12 DIAGNOSIS — Z7901 Long term (current) use of anticoagulants: Secondary | ICD-10-CM | POA: Diagnosis not present

## 2021-10-12 DIAGNOSIS — I420 Dilated cardiomyopathy: Secondary | ICD-10-CM | POA: Diagnosis not present

## 2021-10-12 DIAGNOSIS — I4819 Other persistent atrial fibrillation: Secondary | ICD-10-CM | POA: Diagnosis not present

## 2021-10-12 DIAGNOSIS — I5042 Chronic combined systolic (congestive) and diastolic (congestive) heart failure: Secondary | ICD-10-CM

## 2021-10-12 LAB — BASIC METABOLIC PANEL
Anion gap: 7 (ref 5–15)
BUN: 14 mg/dL (ref 8–23)
CO2: 23 mmol/L (ref 22–32)
Calcium: 8.7 mg/dL — ABNORMAL LOW (ref 8.9–10.3)
Chloride: 107 mmol/L (ref 98–111)
Creatinine, Ser: 0.94 mg/dL (ref 0.61–1.24)
GFR, Estimated: >60 mL/min (ref >60)
Glucose, Bld: 97 mg/dL (ref 70–99)
Potassium: 4.2 mmol/L (ref 3.5–5.1)
Sodium: 137 mmol/L (ref 135–145)

## 2021-10-12 LAB — LIPID PANEL
Cholesterol: 116 mg/dL (ref 0–200)
HDL: 45 mg/dL (ref >40)
LDL Cholesterol: 61 mg/dL (ref 0–99)
Total CHOL/HDL Ratio: 2.6 RATIO
Triglycerides: 49 mg/dL (ref <150)
VLDL: 10 mg/dL (ref 0–40)

## 2021-10-12 LAB — ECHOCARDIOGRAM COMPLETE
AR max vel: 2.43 cm2
AV Peak grad: 4.9 mmHg
Ao pk vel: 1.11 m/s
Area-P 1/2: 3.85 cm2
Calc EF: 39.7 %
S' Lateral: 4 cm
Single Plane A2C EF: 39.7 %
Single Plane A4C EF: 39 %

## 2021-10-12 LAB — CBC
HCT: 42.3 % (ref 39.0–52.0)
Hemoglobin: 13.6 g/dL (ref 13.0–17.0)
MCH: 30.3 pg (ref 26.0–34.0)
MCHC: 32.2 g/dL (ref 30.0–36.0)
MCV: 94.2 fL (ref 80.0–100.0)
Platelets: 197 10*3/uL (ref 150–400)
RBC: 4.49 MIL/uL (ref 4.22–5.81)
RDW: 13.4 % (ref 11.5–15.5)
WBC: 5.8 10*3/uL (ref 4.0–10.5)
nRBC: 0 % (ref 0.0–0.2)

## 2021-10-12 LAB — BRAIN NATRIURETIC PEPTIDE: B Natriuretic Peptide: 91.8 pg/mL (ref 0.0–100.0)

## 2021-10-12 MED ORDER — PERFLUTREN LIPID MICROSPHERE
1.0000 mL | INTRAVENOUS | Status: DC | PRN
  Administered 2021-10-12: 2 mL via INTRAVENOUS
  Filled 2021-10-12: qty 10

## 2021-10-12 MED ORDER — EPLERENONE 50 MG PO TABS: 50.0000 mg | ORAL_TABLET | Freq: Every day | ORAL | 3 refills | Status: DC

## 2021-10-12 NOTE — Patient Instructions (Addendum)
Medication Changes: ? ?Increase Eplerenone to 50 mg daily ? ?Lab Work: ? ?Labs done today, your results will be available in MyChart, we will contact you for abnormal readings. ? ? ?Testing/Procedures: ? ?Repeat blood work in 10 days ? ?Referrals: ? ?nnoe ? ?Special Instructions // Education: ? ?none ? ?Follow-Up in: 3 months  ? ?At the [facility], you and your health needs are our priority. We have a designated team specialized in the treatment of Heart Failure. This Care Team includes your primary Heart Failure Specialized Cardiologist (physician), Advanced Practice Providers (APPs- Physician Assistants and Nurse Practitioners), and Pharmacist who all work together to provide you with the care you need, when you need it.  ? ?You may see any of the following providers on your designated Care Team at your next follow up: ? ?Dr Glori Bickers ?Dr Loralie Champagne ?Darrick Grinder, NP ?Lyda Jester, PA ?Jessica Milford,NP ?Marlyce Huge, PA ?Audry Riles, PharmD ? ? ?Please be sure to bring in all your medications bottles to every appointment.  ? ?Need to Contact us: ? ?If you have any questions or concerns before your next appointment please send [place] a message through [facility] or call our office at [phone].   ? ?TO LEAVE A MESSAGE FOR THE NURSE SELECT OPTION 2, PLEASE LEAVE A MESSAGE INCLUDING: ?YOUR NAME ?DATE OF BIRTH ?CALL BACK NUMBER ?REASON FOR CALL**this is important as we prioritize the call backs ? ?YOU WILL RECEIVE A CALL BACK THE SAME DAY AS LONG AS YOU CALL BEFORE 4:00 PM ? ? ?

## 2021-10-12 NOTE — Progress Notes (Signed)
PCP: Lawerance Cruel, MD ?Cardiology: Dr. Radford Pax ?HF Cardiology: Dr. Aundra Dubin ? ?76 y.o. with history of chronic systolic CHF and paroxysmal atrial fibrillation was referred by Dr. Radford Pax for evaluation of CHF.  Patient has been short of breath for years, worse over the last 6 months.  He has had an extensive workup.  Echo in 3/21 showed EF 45-50% with mildly decreased RV function.  Cardiolite in 4/21 showed fixed apical defect.  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  PYP scan in 8/21 was not suggestive of TTR cardiac amyloidosis.   He had been in persistent atrial fibrillation for about 2 years.  He saw Dr. Quentin Ore and was started on amiodarone.  DCCV was done in 10/21 back to NSR.  He was found to have M-spike on SPEP.  He saw Dr. Marin Olp who thought this was MGUS.  ? ?LHC/RHC in 12/21 showed occluded small OM2 and 80-90% stenosis mid PLOM (small caliber), no interventional target; normal filling pressures and preserved cardiac output.  ? ?He was unable to tolerate Jardiance due to extreme dizziness. He is off both Coreg and Toprol XL due to lightheadedness/hypotension.  ? ?He developed recurrent atrial fibrillation and failed DCCV in 4/22.  He has been in persistent atrial fibrillation, Dr Quentin Ore stopped his amiodarone due to futility.  In 7/22, he had AV nodal ablation with St Jude CRT-P device placement.  ? ?Echo in 5/22 showed EF 35-40%, mild LV enlargement, normal RV.  Echo was done today and reviewed, EF 35-40%, diffuse hypokinesis, mildly decreased RV systolic function, IVC normal.  ? ?Patient returns for followup of CHF.  He is not short of breath walking on flat ground, can go for 30 minutes on a treadmill.  He gets short of breath walking up stairs or up hills.  Weight down 4 lbs.  No chest pain.  No syncope, rare lightheadedness. No orthopnea/PND.    ? ?Labs (7/21): Urine immunofixation with monoclonal IgG  protein, SPEP with M-spike.   ?Labs (10/21): TSH normal, K 4, creatinine 0.97, LFTs normal ?Labs (11/21): K 4.8, creatinine 1.07, hgb 14.7, LFTs normal, TSH normal ?Labs (12/21): LDL 66, HDL 45 ?Labs (1/22): K 3.9, creatinine 1.07 ?Labs (4/22): K 4.5, creatinine 1.14, TSH normal, hgb 13.8 ?Labs (7/22): K 4.5, creatinine 1.0 ?Labs (8/22): LDL 57 ?Labs (10/22): K 4.5, creatinine 1.07 ?Labs (3/23): K 3.8, creatinine 0.99 ? ?St Jude device interrogation: stable thoracic impedance, >99% BiV pacing  ? ?PMH: ?1. CVA: Occurred in 3/21, he was not on apixaban at the time.  ?2. GERD ?3. OSA: Uses CPAP.  ?4. Hyperlipidemia ?5. Atrial fibrillation: Paroxysmal.  ?- DCCV to NSR in 10/21.  ?- Failed DCCV in 4/22, stopped amiodarone.  ?- AV nodal ablation with St Jude CRT-P in 7/22.  ?6. MGUS ?7. Chronic systolic CHF: Echo (3/41) with EF 45-50%, mild LVH, mildly decreased RV systolic function. Primarily nonischemic cardiomyopathy.  ?- Cardiolite (4/21): Fixed apical defect.  ?- Cardiac MRI (9/21): Mild LV dilation with mild LV hypertrophy, EF 39%, prominent trabeculations concerning for noncompaction. Mild RV dilation with EF 28%, severe LAE.  No delayed enhancement.  ?- PYP scan (8/21): grade 1, H/CL 1-1.5.  Unlikely to have cardiac amyloidosis.  ?- LHC/RHC (12/21): small OM2 totally occluded, 80-90% mid small PLOM (no interventional target); mean RA 5, PA 25/9, mean PCWP 5, CI 2.77 Fick/3.29 Thermo.  ?- St Jude CRT-P  ?- Echo (5/23):  EF 35-40%, diffuse hypokinesis, mildly decreased RV systolic function, IVC normal.  ?8. Orthostatic hypotension.  ?9. CAD: Coronary angiography 12/21 with small OM2 totally occluded, 80-90% mid small PLOM (no interventional target) ?10. Carotid ultrasound (8/22): mild BICA stenosis.  ?11. COVID-19 9/22 ?12. PVCs ? ?Social History  ? ?Socioeconomic History  ? Marital status: Married  ?  Spouse name: Not on file  ? Number of children: Not on file  ? Years of education: Not on file  ? Highest education  level: Not on file  ?Occupational History  ? Not on file  ?Tobacco Use  ? Smoking status: Never  ? Smokeless tobacco: Never  ?Vaping Use  ? Vaping Use: Never used  ?Substance and Sexual Activity  ? Alcohol use: Yes  ?  Alcohol/week: 1.0 standard drink  ?  Types: 1 Cans of beer per week  ?  Comment: Rare  ? Drug use: No  ? Sexual activity: Not Currently  ?Other Topics Concern  ? Not on file  ?Social History Narrative  ? Not on file  ? ?Social Determinants of Health  ? ?Financial Resource Strain: Not on file  ?Food Insecurity: Not on file  ?Transportation Needs: Not on file  ?Physical Activity: Not on file  ?Stress: Not on file  ?Social Connections: Not on file  ?Intimate Partner Violence: Not on file  ? ?Family History  ?Problem Relation Age of Onset  ? Lung cancer Mother   ?     smoker  ? ?ROS: All systems reviewed and negative except as per HPI.  ? ?Current Outpatient Medications  ?Medication Sig Dispense Refill  ? acetaminophen (TYLENOL) 500 MG tablet Take 1,000 mg by mouth every 6 (six) hours as needed (PAIN).    ? apixaban (ELIQUIS) 5 MG TABS tablet Take 1 tablet (5 mg total) by mouth 2 (two) times daily. 60 tablet 0  ? brimonidine (ALPHAGAN) 0.15 % ophthalmic solution Place 1 drop into the right eye at bedtime.    ? cetirizine (ZYRTEC) 10 MG tablet Take 10 mg by mouth at bedtime.    ? Cholecalciferol (VITAMIN D3) 50 MCG (2000 UT) TABS Take 2,000 Units by mouth daily.    ? ferrous sulfate 325 (65 FE) MG tablet Take 325 mg by mouth in the morning.    ? fluticasone (FLONASE) 50 MCG/ACT nasal spray Place 2 sprays into both nostrils 2 (two) times daily.     ? Glucosamine Sulfate 750 MG TABS Take 750 mg by mouth in the morning and at bedtime.    ? losartan (COZAAR) 25 MG tablet Take 1 tablet (25 mg total) by mouth at bedtime. 30 tablet 6  ? Melatonin 10 MG TABS Take 10 mg by mouth at bedtime.     ? Multiple Vitamin (MULTIVITAMIN WITH MINERALS) TABS tablet Take 1 tablet by mouth every evening.    ? REPATHA SURECLICK  122 MG/ML SOAJ INJECT ONE pen SUBCUTANEOUSLY EVERY 14 DAYS 6 mL 3  ? rosuvastatin (CRESTOR) 5 MG tablet Take 5 mg by mouth every Monday, Wednesday, and Friday. IN THE EVENING    ? sildenafil (REVATIO) 20 MG tablet Take 40-100 mg by mouth daily as needed (erectile dysfunction.).   5  ? tamsulosin (FLOMAX) 0.4 MG CAPS capsule Take 0.4 mg by mouth at bedtime.     ? eplerenone (INSPRA) 50 MG tablet Take 1 tablet (50 mg total) by mouth daily. 90 tablet 3  ? ?No current facility-administered medications for this encounter.  ? ?BP 124/80  Pulse 74   Wt 131.7 kg (290 lb 6.4 oz)   SpO2 95%   BMI 40.50 kg/m?  ?General: NAD ?Neck: No JVD, no thyromegaly or thyroid nodule.  ?Lungs: Clear to auscultation bilaterally with normal respiratory effort. ?CV: Nondisplaced PMI.  Heart regular S1/S2, no S3/S4, no murmur.  No peripheral edema.  No carotid bruit.  Normal pedal pulses.  ?Abdomen: Soft, nontender, no hepatosplenomegaly, no distention.  ?Skin: Intact without lesions or rashes.  ?Neurologic: Alert and oriented x 3.  ?Psych: Normal affect. ?Extremities: No clubbing or cyanosis.  ?HEENT: Normal.  ? ?Assessment/Plan: ?1. Chronic systolic CHF: Cardiac MRI in 9/21 with EF 39% and evidence for noncompaction cardiomyopathy, no delayed enhancement, significant RV dysfunction with RV EF 22%.  PYP scan was not suggestive of TTR amyloidosis and no symptoms or peripheral neuropathy.  12/21 LHC showed CAD but does not explain cardiomyopathy, no interventional target.  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 Most recent echo was done today, showing  EF 35-40%, diffuse hypokinesis, mildly decreased RV systolic function, IVC normal. He has a history of orthostatic hypotension which has limited medication titration but this is doing better since AV nodal ablation with St Jude CRT-P.  NYHA class II.  He is not volume overloaded by exam or Corvue.     ?- He did not tolerate Jardiance or Farxiga due to dizziness.   ?- Increase eplerenone to 50 mg daily.  BMET/BNP today, BMET 10 days.   ?- Continue losartan 25 mg qhs. Suspect BP will not tolerate transition to Entresto.  ?- Unable to tolerate Coreg or Toprol XL due to

## 2021-10-14 DIAGNOSIS — I5022 Chronic systolic (congestive) heart failure: Secondary | ICD-10-CM | POA: Diagnosis not present

## 2021-10-14 DIAGNOSIS — E7800 Pure hypercholesterolemia, unspecified: Secondary | ICD-10-CM | POA: Diagnosis not present

## 2021-10-14 DIAGNOSIS — N401 Enlarged prostate with lower urinary tract symptoms: Secondary | ICD-10-CM | POA: Diagnosis not present

## 2021-10-14 DIAGNOSIS — I4891 Unspecified atrial fibrillation: Secondary | ICD-10-CM | POA: Diagnosis not present

## 2021-10-22 ENCOUNTER — Ambulatory Visit (HOSPITAL_COMMUNITY)
Admission: RE | Admit: 2021-10-22 | Discharge: 2021-10-22 | Disposition: A | Discharge status: Home / Self Care | Payer: PPO | Source: Ambulatory Visit | Attending: Internal Medicine | Admitting: Internal Medicine

## 2021-10-22 DIAGNOSIS — I5042 Chronic combined systolic (congestive) and diastolic (congestive) heart failure: Secondary | ICD-10-CM | POA: Diagnosis not present

## 2021-10-22 LAB — BASIC METABOLIC PANEL
Anion gap: 6 (ref 5–15)
BUN: 14 mg/dL (ref 8–23)
CO2: 25 mmol/L (ref 22–32)
Calcium: 8.8 mg/dL — ABNORMAL LOW (ref 8.9–10.3)
Chloride: 111 mmol/L (ref 98–111)
Creatinine, Ser: 1.03 mg/dL (ref 0.61–1.24)
GFR, Estimated: >60 mL/min (ref >60)
Glucose, Bld: 105 mg/dL — ABNORMAL HIGH (ref 70–99)
Potassium: 4.3 mmol/L (ref 3.5–5.1)
Sodium: 142 mmol/L (ref 135–145)

## 2021-11-04 DIAGNOSIS — R972 Elevated prostate specific antigen [PSA]: Secondary | ICD-10-CM | POA: Diagnosis not present

## 2021-11-05 ENCOUNTER — Other Ambulatory Visit (HOSPITAL_COMMUNITY): Payer: Self-pay | Admitting: Cardiology

## 2021-11-05 IMAGING — MR MR CARD MORPHOLOGY WO/W CM
45 of 48 series · 45 of 48 positions shown · IV contrast (gadavist)
Comparison: none

CLINICAL DATA: 74-year-old male with h/o atrial fibrillation,
dilated cardiomyopathy.

EXAM:
CARDIAC MRI
TECHNIQUE: The patient was scanned on a 1.5 Tesla GE magnet. A dedicated
cardiac coil was used. Functional imaging was done using Fiesta
sequences. [DATE], and 4 chamber views were done to assess for RWMA's.
Modified Ausencia rule using a short axis stack was used to
calculate an ejection fraction on a dedicated work station using
Circle software. The patient received 10 cc of Gadavist. After 10
minutes inversion recovery sequences were used to assess for
infiltration and scar tissue.
CONTRAST:  10 cc  of Gadavist

[Series 4: [id number]_bh · axial · 8.0mm · 1.72mm/px · 1 of 20 slices shown]
[im 1/20]
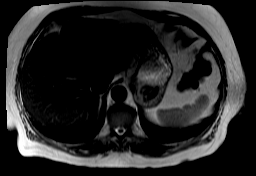

[Series 8: bSSFP · oblique · 8.0mm · 1.79mm/px · 1 of 25 slices shown (1 of 21)]
[im 1/25]
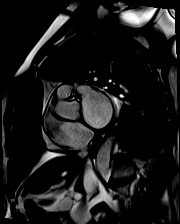

[Series 9: bSSFP · oblique · 8.0mm · 1.79mm/px · 1 of 25 slices shown (2 of 21)]
[im 1/25]
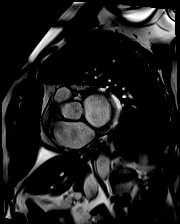

[Series 10: bSSFP · oblique · 8.0mm · 1.79mm/px · 1 of 25 slices shown (3 of 21)]
[im 1/25]
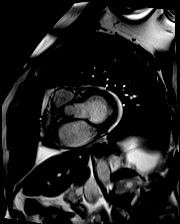

[Series 11: bSSFP · oblique · 8.0mm · 1.79mm/px · 1 of 25 slices shown (4 of 21)]
[im 1/25]
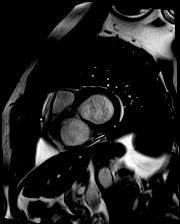

[Series 12: bSSFP · oblique · 8.0mm · 1.79mm/px · 1 of 25 slices shown (5 of 21)]
[im 1/25]
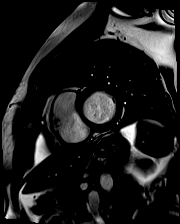

[Series 13: bSSFP · oblique · 8.0mm · 1.61mm/px · 1 of 25 slices shown (6 of 21)]
[im 1/25]
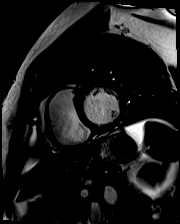

[Series 14: bSSFP · oblique · 8.0mm · 1.61mm/px · 1 of 15 slices shown (7 of 21)]
[im 1/15]
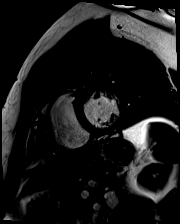

[Series 15: bSSFP · oblique · 8.0mm · 1.61mm/px · 1 of 15 slices shown (8 of 21)]
[im 1/15]
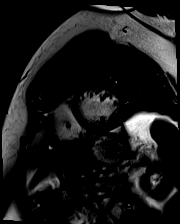

[Series 16: bSSFP · oblique · 8.0mm · 1.61mm/px · 1 of 15 slices shown (9 of 21)]
[im 1/15]
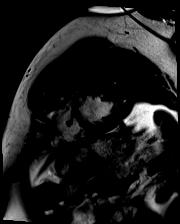

[Series 17: bSSFP · oblique · 8.0mm · 1.61mm/px · 1 of 15 slices shown (10 of 21)]
[im 1/15]
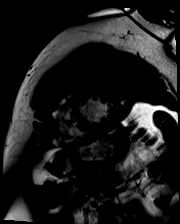

[Series 18: bSSFP · oblique · 8.0mm · 1.61mm/px · 1 of 15 slices shown (11 of 21)]
[im 1/15]
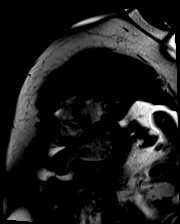

[Series 19: bSSFP · oblique · 8.0mm · 1.61mm/px · 1 of 15 slices shown (12 of 21)]
[im 1/15]
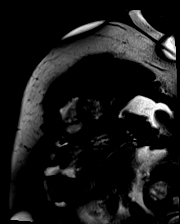

[Series 20: bSSFP · oblique · 8.0mm · 1.61mm/px · 1 of 15 slices shown (13 of 21)]
[im 1/15]
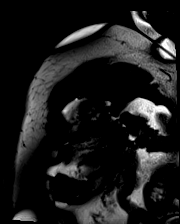

[Series 21: bSSFP · oblique · 8.0mm · 1.61mm/px · 1 of 15 slices shown (14 of 21)]
[im 1/15]
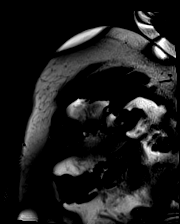

[Series 22: bSSFP · oblique · 8.0mm · 1.61mm/px · 1 of 15 slices shown (15 of 21)]
[im 1/15]
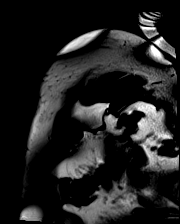

[Series 23: bSSFP · oblique · 8.0mm · 1.61mm/px · 1 of 15 slices shown (16 of 21)]
[im 1/15]
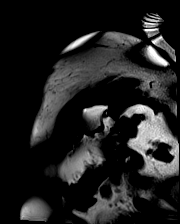

[Series 24: bSSFP · oblique · 8.0mm · 1.61mm/px · 1 of 15 slices shown (17 of 21)]
[im 1/15]
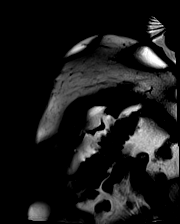

[Series 25: bSSFP · axial · 6.0mm · 1.41mm/px · 1 of 25 slices shown (18 of 21)]
[im 1/25]
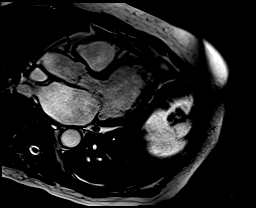

[Series 26: bSSFP · coronal · 6.0mm · 1.41mm/px · 1 of 25 slices shown (19 of 21)]
[im 1/25]
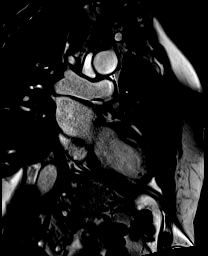

[Series 27: bSSFP · axial · 6.0mm · 1.41mm/px · 1 of 25 slices shown (20 of 21)]
[im 1/25]
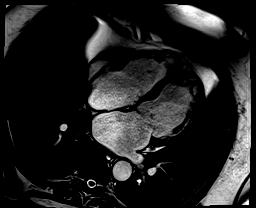

[Series 28: (id)_long_t1 · oblique · 8.0mm · 1.88mm/px · 1 of 24 slices shown]
[im 1/24]
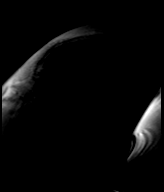

[Series 29: (id)_long_t1_moco · oblique · 8.0mm · 1.88mm/px · 1 of 24 slices shown]
[im 1/24]
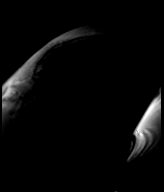

[Series 30: (id)_long_t1_moco_t1 · oblique · 8.0mm · 1.88mm/px · 1 of 5 slices shown]
[im 1/5]
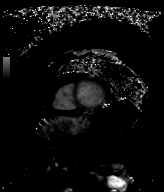

[Series 32: (id)_trufi · oblique · 8.0mm · 1.88mm/px · 1 of 9 slices shown]
[im 1/9]
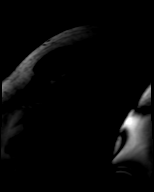

[Series 33: (id)_trufi_moco · oblique · 8.0mm · 1.88mm/px · 1 of 9 slices shown]
[im 1/9]
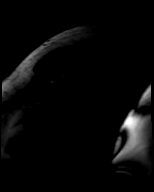

[Series 36: STIR · oblique · 8.0mm · 1.97mm/px · 1 of 17 slices shown]
[im 1/17]
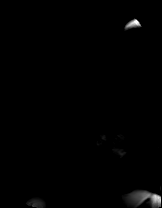

[Series 37: bSSFP · axial · 6.0mm · 1.41mm/px · 1 of 25 slices shown (21 of 21)]
[im 1/25]
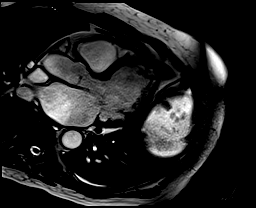

[Series 38: cine_trufi_cs_rt_short axis · oblique · 8.0mm · 1.92mm/px · 1 of 13 slices shown (1 of 17)]
[im 1/13]
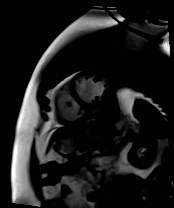

[Series 38: cine_trufi_cs_rt_short axis · oblique · 8.0mm · 1.92mm/px · 1 of 13 slices shown (2 of 17)]
[im 1/13]
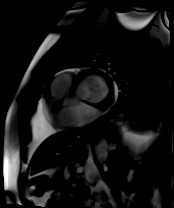

[Series 38: cine_trufi_cs_rt_short axis · oblique · 8.0mm · 1.92mm/px · 1 of 13 slices shown (3 of 17)]
[im 1/13]
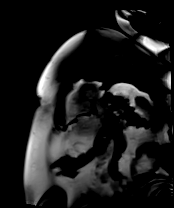

[Series 38: cine_trufi_cs_rt_short axis · oblique · 8.0mm · 1.92mm/px · 1 of 13 slices shown (4 of 17)]
[im 1/13]
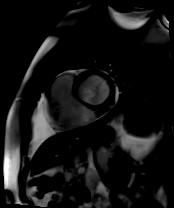

[Series 38: cine_trufi_cs_rt_short axis · oblique · 8.0mm · 1.92mm/px · 1 of 13 slices shown (5 of 17)]
[im 1/13]
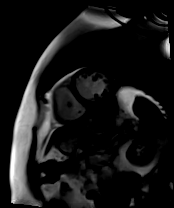

[Series 38: cine_trufi_cs_rt_short axis · oblique · 8.0mm · 1.92mm/px · 1 of 13 slices shown (6 of 17)]
[im 1/13]
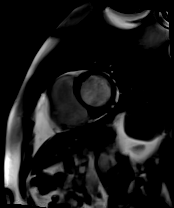

[Series 38: cine_trufi_cs_rt_short axis · oblique · 8.0mm · 1.92mm/px · 1 of 13 slices shown (7 of 17)]
[im 1/13]
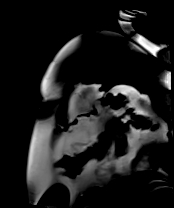

[Series 38: cine_trufi_cs_rt_short axis · oblique · 8.0mm · 1.92mm/px · 1 of 13 slices shown (8 of 17)]
[im 1/13]
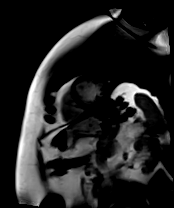

[Series 38: cine_trufi_cs_rt_short axis · oblique · 8.0mm · 1.92mm/px · 1 of 13 slices shown (9 of 17)]
[im 1/13]
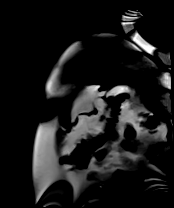

[Series 38: cine_trufi_cs_rt_short axis · oblique · 8.0mm · 1.92mm/px · 1 of 13 slices shown (10 of 17)]
[im 1/13]
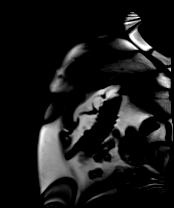

[Series 38: cine_trufi_cs_rt_short axis · oblique · 8.0mm · 1.92mm/px · 1 of 13 slices shown (11 of 17)]
[im 1/13]
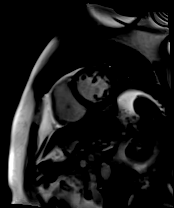

[Series 38: cine_trufi_cs_rt_short axis · oblique · 8.0mm · 1.92mm/px · 1 of 13 slices shown (12 of 17)]
[im 1/13]
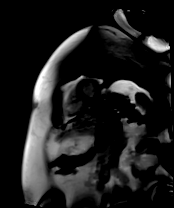

[Series 38: cine_trufi_cs_rt_short axis · oblique · 8.0mm · 1.92mm/px · 1 of 13 slices shown (13 of 17)]
[im 1/13]
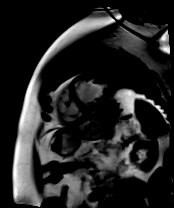

[Series 38: cine_trufi_cs_rt_short axis · oblique · 8.0mm · 1.92mm/px · 1 of 13 slices shown (14 of 17)]
[im 1/13]
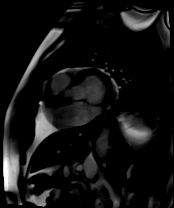

[Series 38: cine_trufi_cs_rt_short axis · oblique · 8.0mm · 1.92mm/px · 1 of 13 slices shown (15 of 17)]
[im 1/13]
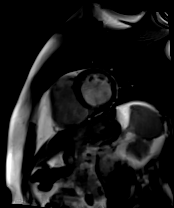

[Series 38: cine_trufi_cs_rt_short axis · oblique · 8.0mm · 1.92mm/px · 1 of 13 slices shown (16 of 17)]
[im 1/13]
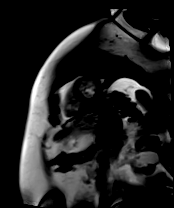

[Series 38: cine_trufi_cs_rt_short axis · oblique · 8.0mm · 1.92mm/px · 1 of 13 slices shown (17 of 17)]
[im 1/13]
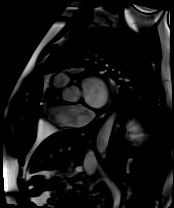

[45 of 48 positions shown; findings below may reference images not displayed]

FINDINGS: 1. Mildly dilated left ventricle with mild concentric hypertrophy
and moderately reduced systolic function (LVEF = 39%). There is
diffuse hypokinesis and no late gadolinium enhancement in the left
ventricular myocardium. There are prominent trabeculations in the
mid and apical segments with the ratio of non-compacted to compacted
myocardium [DATE].

LVEDD: 60 ml

LVESD: 47 ml

LVEDV: 222 ml

LVESV: 134 ml

SV: 87 ml

CO: 6.8 L/min

Myocardial mass: 171 g

2. Mildly dilated right ventricle with moderately decreased systolic
function (RVEF = 28%) and no regional wall motion abnormalities.

3. Severely dilated left atrium and moderately dilated right atrial
size.

4. Normal size of the aortic root, ascending aorta and pulmonary
artery.

5.  Mild mitral and tricuspid regurgitation.

6.  Normal pericardium.  Trivial pericardial effusion.
IMPRESSION: 1. Mildly dilated left ventricle with mild concentric hypertrophy
and moderately reduced systolic function (LVEF = 39%). There is
diffuse hypokinesis and no late gadolinium enhancement in the left
ventricular myocardium. There are prominent trabeculations in the
mid and apical segments with the ratio of non-compacted to compacted
myocardium [DATE].

2. Mildly dilated right ventricle with moderately decreased systolic
function (RVEF = 28%) and no regional wall motion abnormalities.

3. Severely dilated left atrium and moderately dilated right atrial
size.

4. Normal size of the aortic root, ascending aorta and pulmonary
artery.

5.  Mild mitral and tricuspid regurgitation.

6.  Normal pericardium.  Trivial pericardial effusion.

These findings are consistent with non-ischemic cardiomyopathy with
suspicion for non-compaction cardiomyopathy.

## 2021-12-02 ENCOUNTER — Telehealth: Payer: Self-pay | Admitting: Cardiology

## 2021-12-02 MED ORDER — EZETIMIBE 10 MG PO TABS: 10.0000 mg | ORAL_TABLET | Freq: Every day | ORAL | 3 refills | Status: DC

## 2021-12-02 NOTE — Telephone Encounter (Signed)
Pt c/o medication issue:  1. Name of Medication:   REPATHA SURECLICK 638 MG/ML SOAJ    2. How are you currently taking this medication (dosage and times per day)?   3. Are you having a reaction (difficulty breathing--STAT)?   4. What is your medication issue? Pt needs a call back in regards to [place] in regards to the patients program assistance running out and would like a call back to discuss other options. Please advise.

## 2021-12-02 NOTE — Telephone Encounter (Signed)
[facility] grant closed currently. I have added him to the list for renewal when it reopens. Will use zetia '10mg'$  daily in the meantime along with his rosuvastatin '5mg'$  three times a week.

## 2021-12-10 DIAGNOSIS — N401 Enlarged prostate with lower urinary tract symptoms: Secondary | ICD-10-CM | POA: Diagnosis not present

## 2021-12-10 DIAGNOSIS — I5022 Chronic systolic (congestive) heart failure: Secondary | ICD-10-CM | POA: Diagnosis not present

## 2021-12-10 DIAGNOSIS — E7800 Pure hypercholesterolemia, unspecified: Secondary | ICD-10-CM | POA: Diagnosis not present

## 2021-12-13 ENCOUNTER — Ambulatory Visit (INDEPENDENT_AMBULATORY_CARE_PROVIDER_SITE_OTHER): Payer: PPO

## 2021-12-13 DIAGNOSIS — I447 Left bundle-branch block, unspecified: Secondary | ICD-10-CM | POA: Diagnosis not present

## 2021-12-13 LAB — CUP PACEART REMOTE DEVICE CHECK
Battery Remaining Longevity: 88 mo
Battery Remaining Percentage: 89 %
Battery Voltage: 3.01 V
Date Time Interrogation Session: 20230710020011
Implantable Lead Implant Date: 20220708
Implantable Lead Implant Date: 20220708
Implantable Lead Location: 753858
Implantable Lead Location: 753860
Implantable Pulse Generator Implant Date: 20220708
Lead Channel Impedance Value: 600 Ohm
Lead Channel Impedance Value: 980 Ohm
Lead Channel Pacing Threshold Amplitude: 0.5 V
Lead Channel Pacing Threshold Amplitude: 1 V
Lead Channel Pacing Threshold Pulse Width: 0.5 ms
Lead Channel Pacing Threshold Pulse Width: 0.5 ms
Lead Channel Sensing Intrinsic Amplitude: 5.9 mV
Lead Channel Setting Pacing Amplitude: 2.5 V
Lead Channel Setting Pacing Amplitude: 2.5 V
Lead Channel Setting Pacing Pulse Width: 0.5 ms
Lead Channel Setting Pacing Pulse Width: 0.5 ms
Lead Channel Setting Sensing Sensitivity: 4 mV
Pulse Gen Model: 3562
Pulse Gen Serial Number: 3913632

## 2021-12-14 DIAGNOSIS — G4733 Obstructive sleep apnea (adult) (pediatric): Secondary | ICD-10-CM | POA: Diagnosis not present

## 2022-01-10 DIAGNOSIS — N401 Enlarged prostate with lower urinary tract symptoms: Secondary | ICD-10-CM | POA: Diagnosis not present

## 2022-01-11 NOTE — Progress Notes (Signed)
PCP: Lawerance Cruel, MD Cardiology: Dr. Radford Pax HF Cardiology: Dr. Aundra Dubin  76 y.o. with history of chronic systolic CHF and paroxysmal atrial fibrillation was referred by Dr. Radford Pax for evaluation of CHF.  Patient has been short of breath for years, worse over the last 6 months.  He has had an extensive workup.  Echo in 3/21 showed EF 45-50% with mildly decreased RV function.  Cardiolite in 4/21 showed fixed apical defect.  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  PYP scan in 8/21 was not suggestive of TTR cardiac amyloidosis.   He had been in persistent atrial fibrillation for about 2 years.  He saw Dr. Quentin Ore and was started on amiodarone.  DCCV was done in 10/21 back to NSR.  He was found to have M-spike on SPEP.  He saw Dr. Marin Olp who thought this was MGUS.   LHC/RHC in 12/21 showed occluded small OM2 and 80-90% stenosis mid PLOM (small caliber), no interventional target; normal filling pressures and preserved cardiac output.   He was unable to tolerate Jardiance due to extreme dizziness. He is off both Coreg and Toprol XL due to lightheadedness/hypotension.   He developed recurrent atrial fibrillation and failed DCCV in 4/22.  He has been in persistent atrial fibrillation, Dr Quentin Ore stopped his amiodarone due to futility.  In 7/22, he had AV nodal ablation with St Jude CRT-P device placement.   Echo in 5/22 showed EF 35-40%, mild LV enlargement, normal RV.  Echo was done today and reviewed, EF 35-40%, diffuse hypokinesis,   He returns for HF follow up with his wife.  Saw Dr Aundra Dubin 3 months ago and eplerenone was increased to 50 mg daily. SBP at home 110s. Complaining of headache. SOB with exertion. Denies PND/Orthopnea. Appetite ok. No fever or chills. Weight at home 80-281  pounds. Taking all medications.   Labs (7/21): Urine immunofixation with monoclonal IgG protein, SPEP with M-spike.   Labs  (10/21): TSH normal, K 4, creatinine 0.97, LFTs normal Labs (11/21): K 4.8, creatinine 1.07, hgb 14.7, LFTs normal, TSH normal Labs (12/21): LDL 66, HDL 45 Labs (1/22): K 3.9, creatinine 1.07 Labs (4/22): K 4.5, creatinine 1.14, TSH normal, hgb 13.8 Labs (7/22): K 4.5, creatinine 1.0 Labs (8/22): LDL 57 Labs (10/22): K 4.5, creatinine 1.07 Labs (3/23): K 3.8, creatinine 0.99  St Jude device interrogation:Impedance down for the last 12 days. 99% BP   PMH: 1. CVA: Occurred in 3/21, he was not on apixaban at the time.  2. GERD 3. OSA: Uses CPAP.  4. Hyperlipidemia 5. Atrial fibrillation: Paroxysmal.  - DCCV to NSR in 10/21.  - Failed DCCV in 4/22, stopped amiodarone.  - AV nodal ablation with St Jude CRT-P in 7/22.  6. MGUS 7. Chronic systolic CHF: Echo (7/61) with EF 45-50%, mild LVH, mildly decreased RV systolic function. Primarily nonischemic cardiomyopathy.  - Cardiolite (4/21): Fixed apical defect.  - Cardiac MRI (9/21): Mild LV dilation with mild LV hypertrophy, EF 39%, prominent trabeculations concerning for noncompaction. Mild RV dilation with EF 28%, severe LAE.  No delayed enhancement.  - PYP scan (8/21): grade 1, H/CL 1-1.5.  Unlikely to have cardiac amyloidosis.  - LHC/RHC (12/21): small OM2 totally occluded, 80-90% mid small PLOM (no interventional target); mean RA 5, PA 25/9, mean PCWP 5, CI 2.77 Fick/3.29 Thermo.  - St Jude CRT-P  - Echo (5/23): EF 35-40%, diffuse hypokinesis, mildly decreased RV systolic function,  IVC normal.  8. Orthostatic hypotension.  9. CAD: Coronary angiography 12/21 with small OM2 totally occluded, 80-90% mid small PLOM (no interventional target) 10. Carotid ultrasound (8/22): mild BICA stenosis.  11. COVID-19 9/22 12. PVCs  Social History   Socioeconomic History   Marital status: Married    Spouse name: Not on file   Number of children: Not on file   Years of education: Not on file   Highest education level: Not on file  Occupational  History   Not on file  Tobacco Use   Smoking status: Never   Smokeless tobacco: Never  Vaping Use   Vaping Use: Never used  Substance and Sexual Activity   Alcohol use: Yes    Alcohol/week: 1.0 standard drink of alcohol    Types: 1 Cans of beer per week    Comment: Rare   Drug use: No   Sexual activity: Not Currently  Other Topics Concern   Not on file  Social History Narrative   Not on file   Social Determinants of Health   Financial Resource Strain: Not on file  Food Insecurity: Not on file  Transportation Needs: Not on file  Physical Activity: Not on file  Stress: Not on file  Social Connections: Not on file  Intimate Partner Violence: Not on file   Family History  Problem Relation Age of Onset   Lung cancer Mother        smoker   ROS: All systems reviewed and negative except as per HPI.   Current Outpatient Medications  Medication Sig Dispense Refill   acetaminophen (TYLENOL) 500 MG tablet Take 1,000 mg by mouth every 6 (six) hours as needed (PAIN).     apixaban (ELIQUIS) 5 MG TABS tablet Take 1 tablet (5 mg total) by mouth 2 (two) times daily. 60 tablet 0   brimonidine (ALPHAGAN) 0.15 % ophthalmic solution Place 1 drop into the right eye at bedtime.     cetirizine (ZYRTEC) 10 MG tablet Take 10 mg by mouth at bedtime.     Cholecalciferol (VITAMIN D3) 50 MCG (2000 UT) TABS Take 2,000 Units by mouth daily.     eplerenone (INSPRA) 50 MG tablet Take 1 tablet (50 mg total) by mouth daily. 90 tablet 3   ezetimibe (ZETIA) 10 MG tablet Take 1 tablet (10 mg total) by mouth daily. 90 tablet 3   ferrous sulfate 325 (65 FE) MG tablet Take 325 mg by mouth in the morning.     fluticasone (FLONASE) 50 MCG/ACT nasal spray Place 2 sprays into both nostrils 2 (two) times daily.      Glucosamine Sulfate 750 MG TABS Take 750 mg by mouth in the morning and at bedtime.     losartan (COZAAR) 25 MG tablet TAKE ONE TABLET BY MOUTH EVERYDAY AT BEDTIME 90 tablet 3   Melatonin 10 MG TABS  Take 10 mg by mouth at bedtime.      Multiple Vitamin (MULTIVITAMIN WITH MINERALS) TABS tablet Take 1 tablet by mouth every evening.     rosuvastatin (CRESTOR) 5 MG tablet Take 5 mg by mouth every Monday, Wednesday, and Friday. IN THE EVENING     sildenafil (REVATIO) 20 MG tablet Take 40-100 mg by mouth daily as needed (erectile dysfunction.).   5   tamsulosin (FLOMAX) 0.4 MG CAPS capsule Take 0.4 mg by mouth at bedtime.      No current facility-administered medications for this encounter.   BP 92/66   Pulse 97   Wt 129.6 kg (  285 lb 12.8 oz)   SpO2 93%   BMI 39.86 kg/m  Wt Readings from Last 3 Encounters:  01/12/22 129.6 kg (285 lb 12.8 oz)  10/12/21 131.7 kg (290 lb 6.4 oz)  07/22/21 133.4 kg (294 lb)    General:  No resp difficulty. Walked in the clinic.  HEENT: normal Neck: supple. JVP 7-8 . Carotids 2+ bilat; no bruits. No lymphadenopathy or thryomegaly appreciated. Cor: PMI nondisplaced. Regular rate & rhythm. No rubs, gallops or murmurs. Lungs: clear Abdomen: soft, nontender, nondistended. No hepatosplenomegaly. No bruits or masses. Good bowel sounds. Extremities: no cyanosis, clubbing, rash, edema Neuro: alert & orientedx3, cranial nerves grossly intact. moves all 4 extremities w/o difficulty. Affect pleasant  Assessment/Plan: 1. Chronic systolic CHF: Cardiac MRI in 9/21 with EF 39% and evidence for noncompaction cardiomyopathy, no delayed enhancement, significant RV dysfunction with RV EF 22%.  PYP scan was not suggestive of TTR amyloidosis and no symptoms or peripheral neuropathy.  12/21 LHC showed CAD but does not explain cardiomyopathy, no interventional target.  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 He has a history of orthostatic hypotension which has limited medication titration but this is doing better since AV nodal ablation with St  Jude CRT-P. -NYHA  III. St Jude suggestive of fluid accumulation. Referred to the device clinic for ongoing follow up. Today I sent in lasix 20 mg as needed. If remains fluid overloaded may need to take lasix every now and then.  - He did not tolerate Jardiance or Farxiga due to dizziness.  - Continue eplerenone to 50 mg daily.   - Decrease losartan to 12.5 mg at bed time due to hypotension. Suspect BP will not tolerate transition to Entresto.  - Unable to tolerate Coreg or Toprol XL due to lightheadedness/low BP.  - Would consider addition of Verquvo in the future.  - He would be a candidate for cardiac contractility modulator in the future. Provided with information about CCM Impulse.   - Check BMET and BNP  2. Atrial fibrillation: Persistent.  He has severe LAE. Not good candidate for atrial fibrillation ablation per EP.  He failed DCCV on amiodarone in 4/22, amiodarone stopped.  AV nodal ablation with St Jude CRT-P in 7/22.  - No bleeding issues.  - Continue apixaban 5 mg bid.  3. OSA: Continue CPAP.  4. Orthostatic hypotension: Limits medications.   - Continue compression stockings and follow over time.   5. CAD: Moderate disease on cath in 12/21, no intervention target.  No chest pain.  Extent of disease does not explain cardiomyopathy.   - no cehst pain.  - Continue Crestor and Repatha, check lipids today.  - He is on apixaban with stable CAD, so no ASA.    Follow up in 3 months with Dr Aundra Dubin.   Porsha Skilton NP-C  01/12/2022

## 2022-01-12 ENCOUNTER — Encounter (HOSPITAL_COMMUNITY): Payer: Self-pay

## 2022-01-12 ENCOUNTER — Ambulatory Visit (HOSPITAL_COMMUNITY)
Admission: RE | Admit: 2022-01-12 | Discharge: 2022-01-12 | Disposition: A | Discharge status: Home / Self Care | Payer: PPO | Source: Ambulatory Visit | Attending: Adult Health | Admitting: Adult Health

## 2022-01-12 VITALS — BP 92/64 | HR 97 | Wt 285.8 lb

## 2022-01-12 DIAGNOSIS — G4733 Obstructive sleep apnea (adult) (pediatric): Secondary | ICD-10-CM | POA: Insufficient documentation

## 2022-01-12 DIAGNOSIS — I4819 Other persistent atrial fibrillation: Secondary | ICD-10-CM | POA: Diagnosis not present

## 2022-01-12 DIAGNOSIS — Z79899 Other long term (current) drug therapy: Secondary | ICD-10-CM | POA: Diagnosis not present

## 2022-01-12 DIAGNOSIS — I429 Cardiomyopathy, unspecified: Secondary | ICD-10-CM | POA: Diagnosis not present

## 2022-01-12 DIAGNOSIS — I4821 Permanent atrial fibrillation: Secondary | ICD-10-CM

## 2022-01-12 DIAGNOSIS — I2510 Atherosclerotic heart disease of native coronary artery without angina pectoris: Secondary | ICD-10-CM | POA: Diagnosis not present

## 2022-01-12 DIAGNOSIS — I5042 Chronic combined systolic (congestive) and diastolic (congestive) heart failure: Secondary | ICD-10-CM

## 2022-01-12 DIAGNOSIS — Z7901 Long term (current) use of anticoagulants: Secondary | ICD-10-CM | POA: Diagnosis not present

## 2022-01-12 DIAGNOSIS — I951 Orthostatic hypotension: Secondary | ICD-10-CM | POA: Insufficient documentation

## 2022-01-12 DIAGNOSIS — I428 Other cardiomyopathies: Secondary | ICD-10-CM | POA: Diagnosis not present

## 2022-01-12 DIAGNOSIS — I5022 Chronic systolic (congestive) heart failure: Secondary | ICD-10-CM | POA: Insufficient documentation

## 2022-01-12 MED ORDER — FUROSEMIDE 20 MG PO TABS: 20.0000 mg | ORAL_TABLET | ORAL | 1 refills | Status: DC

## 2022-01-12 MED ORDER — LOSARTAN POTASSIUM 25 MG PO TABS: 12.5000 mg | ORAL_TABLET | Freq: Every day | ORAL | 3 refills | Status: DC

## 2022-01-12 NOTE — Patient Instructions (Signed)
DECREASE Losartan to 12.5 mg (1/2 tab) Daily at bedtime  Take Furosemide 20 mg as directed  Your physician recommends that you schedule a follow-up appointment in: 3 months  If you have any questions or concerns before your next appointment please send [place] a message through [place] or call our office at [phone].    TO LEAVE A MESSAGE FOR THE NURSE SELECT OPTION 2, PLEASE LEAVE A MESSAGE INCLUDING: YOUR NAME DATE OF BIRTH CALL BACK NUMBER REASON FOR CALL**this is important as we prioritize the call backs  YOU WILL RECEIVE A CALL BACK THE SAME DAY AS LONG AS YOU CALL BEFORE 4:00 PM  At the [facility], you and your health needs are our priority. As part of our continuing mission to provide you with exceptional heart care, we have created designated Provider Care Teams. These Care Teams include your primary Cardiologist (physician) and Advanced Practice Providers (APPs- Physician Assistants and Nurse Practitioners) who all work together to provide you with the care you need, when you need it.   You may see any of the following providers on your designated Care Team at your next follow up: Dr Glori Bickers Dr Haynes Kerns, NP Lyda Jester, [place] [facility] [place], [place] Audry Riles, PharmD   Please be sure to bring in all your medications bottles to every appointment.

## 2022-01-12 NOTE — Progress Notes (Signed)
Remote pacemaker transmission.   

## 2022-01-17 DIAGNOSIS — R972 Elevated prostate specific antigen [PSA]: Secondary | ICD-10-CM | POA: Diagnosis not present

## 2022-01-17 DIAGNOSIS — R351 Nocturia: Secondary | ICD-10-CM | POA: Diagnosis not present

## 2022-01-17 DIAGNOSIS — R3915 Urgency of urination: Secondary | ICD-10-CM | POA: Diagnosis not present

## 2022-01-17 DIAGNOSIS — N401 Enlarged prostate with lower urinary tract symptoms: Secondary | ICD-10-CM | POA: Diagnosis not present

## 2022-01-17 DIAGNOSIS — N5201 Erectile dysfunction due to arterial insufficiency: Secondary | ICD-10-CM | POA: Diagnosis not present

## 2022-01-19 ENCOUNTER — Encounter (HOSPITAL_COMMUNITY): Payer: Self-pay | Admitting: Cardiology

## 2022-01-19 DIAGNOSIS — L218 Other seborrheic dermatitis: Secondary | ICD-10-CM | POA: Diagnosis not present

## 2022-01-19 DIAGNOSIS — L821 Other seborrheic keratosis: Secondary | ICD-10-CM | POA: Diagnosis not present

## 2022-01-19 DIAGNOSIS — Z85828 Personal history of other malignant neoplasm of skin: Secondary | ICD-10-CM | POA: Diagnosis not present

## 2022-01-19 DIAGNOSIS — Z08 Encounter for follow-up examination after completed treatment for malignant neoplasm: Secondary | ICD-10-CM | POA: Diagnosis not present

## 2022-01-19 DIAGNOSIS — L570 Actinic keratosis: Secondary | ICD-10-CM | POA: Diagnosis not present

## 2022-01-19 DIAGNOSIS — L814 Other melanin hyperpigmentation: Secondary | ICD-10-CM | POA: Diagnosis not present

## 2022-01-19 DIAGNOSIS — D225 Melanocytic nevi of trunk: Secondary | ICD-10-CM | POA: Diagnosis not present

## 2022-01-20 ENCOUNTER — Telehealth: Payer: Self-pay

## 2022-01-20 ENCOUNTER — Encounter: Payer: Self-pay | Admitting: Cardiology

## 2022-01-20 NOTE — Telephone Encounter (Addendum)
Referred to ICM clinic by Darrick Grinder, NP at HF clinic after 8/9 OV.  Per HF note patient was given Lasix script to take as needed and if remains fluid overloaded may need to take PRN Lasix.  He is a patient of Dr Aundra Dubin and Dr Quentin Ore.   Attempted call to patient for ICM intro and left message for return call.

## 2022-01-24 NOTE — Telephone Encounter (Signed)
Spoke with patient and ICM intro given.  Patient is agreeable to ICM monthly follow up.  Advised transmission will send automatically between 12 Midnight and 6:00 AM if monitor is by bedside.  Explained will call with results after transmission is reviewed.  Explained a Remote Home Transmission will be seen as daytime appointment on office summary visit sheet but the time is not relevant since all transmissions are sent at night time so there is no obligation to stay by the monitor at that time appointed time during the day.  Provided ICM direct number and explained should call if experiencing any fluid symptoms such as weight gain, shortness of breath or extremity/abdominal swelling.  1st ICM remote transmission scheduled for 01/31/2022.

## 2022-01-26 DIAGNOSIS — E7800 Pure hypercholesterolemia, unspecified: Secondary | ICD-10-CM | POA: Diagnosis not present

## 2022-01-26 DIAGNOSIS — D649 Anemia, unspecified: Secondary | ICD-10-CM | POA: Diagnosis not present

## 2022-01-26 DIAGNOSIS — Z79899 Other long term (current) drug therapy: Secondary | ICD-10-CM | POA: Diagnosis not present

## 2022-01-31 ENCOUNTER — Ambulatory Visit (INDEPENDENT_AMBULATORY_CARE_PROVIDER_SITE_OTHER): Payer: PPO

## 2022-01-31 DIAGNOSIS — I5042 Chronic combined systolic (congestive) and diastolic (congestive) heart failure: Secondary | ICD-10-CM | POA: Diagnosis not present

## 2022-01-31 DIAGNOSIS — Z950 Presence of cardiac pacemaker: Secondary | ICD-10-CM | POA: Diagnosis not present

## 2022-02-01 ENCOUNTER — Telehealth: Payer: Self-pay

## 2022-02-01 ENCOUNTER — Encounter: Payer: Self-pay | Admitting: Cardiology

## 2022-02-01 NOTE — Telephone Encounter (Signed)
Remote ICM transmission received.  Attempted call to patient regarding ICM remote transmission and left detailed message per DPR.  Advised to return call for any fluid symptoms or questions. Next ICM remote transmission scheduled 03/07/2022.

## 2022-02-01 NOTE — Progress Notes (Signed)
Spoke with patient and heart failure questions reviewed.  Pt asymptomatic for fluid accumulation.  Reports feeling well at this time and voices no complaints.  Transmission reviewed.  He will be on cruise from 9/28-10/8 and will change ICM remote transmission to 10/10.   Advised to take PRN Furosemide with him since there foods will be very high salt.  Encouraged to take PRN Furosemide toward the end of vacation.  He verbalized understanding.  No changes and encouraged to call if experiencing any fluid symptoms.

## 2022-02-01 NOTE — Progress Notes (Signed)
EPIC Encounter for ICM Monitoring  Patient Name: Joshua Moran. is a 76 y.o. male Date: 02/01/2022 Primary Care Physican: Lawerance Cruel, MD Primary Cardiologist: Aundra Dubin Electrophysiologist: Quentin Ore 01/12/2022 Office Weight: 285 lbs        1st ICM remote transmission. Attempted call to patient and unable to reach.  Left detailed message per DPR regarding transmission. Transmission reviewed.    CorVue thoracic impedance normal but was suggesting possible fluid accumulation from 8/5 to 8/9 and 8/22.   Prescribed:  Furosemide 20 mg take 1 tablet(s) (20 mg total) by mouth as directed (as needed).  Recommendations: Left voice mail with ICM number and encouraged to call if experiencing any fluid symptoms.  Follow-up plan: ICM clinic phone appointment on 03/07/2022.   91 day device clinic remote transmission 03/14/2022.    EP/Cardiology Office Visits: 04/15/2022 with Dr. Aundra Dubin.  Recall 12/20/2021 with Dr Quentin Ore or APP for 9 month f/u.   Copy of ICM check sent to Dr. Quentin Ore.   3 month ICM trend: 01/31/2022.    12-14 Month ICM trend:     Rosalene Billings, RN 02/01/2022 1:29 PM

## 2022-02-02 ENCOUNTER — Encounter (HOSPITAL_BASED_OUTPATIENT_CLINIC_OR_DEPARTMENT_OTHER): Payer: Self-pay | Admitting: Cardiology

## 2022-02-02 DIAGNOSIS — J339 Nasal polyp, unspecified: Secondary | ICD-10-CM | POA: Diagnosis not present

## 2022-02-02 DIAGNOSIS — R066 Hiccough: Secondary | ICD-10-CM | POA: Diagnosis not present

## 2022-02-02 DIAGNOSIS — D649 Anemia, unspecified: Secondary | ICD-10-CM | POA: Diagnosis not present

## 2022-02-02 DIAGNOSIS — E7800 Pure hypercholesterolemia, unspecified: Secondary | ICD-10-CM | POA: Diagnosis not present

## 2022-02-02 DIAGNOSIS — Z79899 Other long term (current) drug therapy: Secondary | ICD-10-CM | POA: Diagnosis not present

## 2022-02-02 DIAGNOSIS — Z0000 Encounter for general adult medical examination without abnormal findings: Secondary | ICD-10-CM | POA: Diagnosis not present

## 2022-02-03 NOTE — Progress Notes (Signed)
Attempted call to patient.  Left message will reschedule ICM remote transmission from 10/10 to 10/16 since I will be out of the office the week of 10/8.  Advised to call back if the date does not work for him.

## 2022-03-14 ENCOUNTER — Ambulatory Visit (INDEPENDENT_AMBULATORY_CARE_PROVIDER_SITE_OTHER): Payer: PPO

## 2022-03-14 DIAGNOSIS — I428 Other cardiomyopathies: Secondary | ICD-10-CM

## 2022-03-14 DIAGNOSIS — I5042 Chronic combined systolic (congestive) and diastolic (congestive) heart failure: Secondary | ICD-10-CM | POA: Diagnosis not present

## 2022-03-15 ENCOUNTER — Other Ambulatory Visit (HOSPITAL_COMMUNITY): Payer: Self-pay | Admitting: Cardiology

## 2022-03-15 LAB — CUP PACEART REMOTE DEVICE CHECK
Battery Remaining Longevity: 85 mo
Battery Remaining Percentage: 87 %
Battery Voltage: 3.02 V
Date Time Interrogation Session: 20231009032631
Implantable Lead Implant Date: 20220708
Implantable Lead Implant Date: 20220708
Implantable Lead Location: 753858
Implantable Lead Location: 753860
Implantable Pulse Generator Implant Date: 20220708
Lead Channel Impedance Value: 1025 Ohm
Lead Channel Impedance Value: 600 Ohm
Lead Channel Pacing Threshold Amplitude: 0.5 V
Lead Channel Pacing Threshold Amplitude: 1 V
Lead Channel Pacing Threshold Pulse Width: 0.5 ms
Lead Channel Pacing Threshold Pulse Width: 0.5 ms
Lead Channel Sensing Intrinsic Amplitude: 5.8 mV
Lead Channel Setting Pacing Amplitude: 2.5 V
Lead Channel Setting Pacing Amplitude: 2.5 V
Lead Channel Setting Pacing Pulse Width: 0.5 ms
Lead Channel Setting Pacing Pulse Width: 0.5 ms
Lead Channel Setting Sensing Sensitivity: 4 mV
Pulse Gen Model: 3562
Pulse Gen Serial Number: 3913632

## 2022-03-24 DIAGNOSIS — H401114 Primary open-angle glaucoma, right eye, indeterminate stage: Secondary | ICD-10-CM | POA: Diagnosis not present

## 2022-03-24 DIAGNOSIS — H35373 Puckering of macula, bilateral: Secondary | ICD-10-CM | POA: Diagnosis not present

## 2022-03-24 DIAGNOSIS — H33033 Retinal detachment with giant retinal tear, bilateral: Secondary | ICD-10-CM | POA: Diagnosis not present

## 2022-03-24 DIAGNOSIS — Z961 Presence of intraocular lens: Secondary | ICD-10-CM | POA: Diagnosis not present

## 2022-03-25 ENCOUNTER — Ambulatory Visit (INDEPENDENT_AMBULATORY_CARE_PROVIDER_SITE_OTHER): Payer: PPO

## 2022-03-25 DIAGNOSIS — I5042 Chronic combined systolic (congestive) and diastolic (congestive) heart failure: Secondary | ICD-10-CM

## 2022-03-25 DIAGNOSIS — Z950 Presence of cardiac pacemaker: Secondary | ICD-10-CM

## 2022-03-25 NOTE — Progress Notes (Addendum)
Remote pacemaker transmission.   

## 2022-03-25 NOTE — Progress Notes (Signed)
EPIC Encounter for ICM Monitoring  Patient Name: Joshua Moran. is a 76 y.o. male Date: 03/25/2022 Primary Care Physican: Lawerance Cruel, MD Primary Cardiologist: Aundra Dubin Electrophysiologist: Quentin Ore 01/12/2022 Office Weight: 285 lbs                                                             Spoke with patient and heart failure questions reviewed.  Transmission results reviewed.  Pt did have leg swelling during decreased impedance which correlates with his cruise.  Swelling resolved after taking PRN Furosemide.  Advised to continues to take PRN Furosemide for fluid symptoms.    CorVue thoracic impedance normal but was suggesting possible fluid accumulation starting 10/6 which correlates with vacation.    Prescribed:  Furosemide 20 mg take 1 tablet(s) (20 mg total) by mouth as directed (as needed).   Recommendations:   No changes and encouraged to call if experiencing any fluid symptoms.   Follow-up plan: ICM clinic phone appointment on 04/25/2022.   91 day device clinic remote transmission 03/14/2022.     EP/Cardiology Office Visits: 04/15/2022 with Dr. Aundra Dubin.  03/29/2022 with Dr Quentin Ore.    Copy of ICM check sent to Dr. Quentin Ore.   3 month ICM trend: 03/21/2022.    12-14 Month ICM trend:     Rosalene Billings, RN 03/25/2022 1:02 PM

## 2022-03-28 NOTE — Progress Notes (Deleted)
Electrophysiology Office Follow up Visit Note:    Date:  03/28/2022   ID:  Joshua Moran., DOB [birth year], MRN [id number]  PCP:  Joshua Cruel, MD  [facility] Cardiologist:  Joshua Him, MD  [facility] Electrophysiologist:  Joshua Epley, MD    Interval History:    Joshua Moran. is a 76 y.o. male who presents for a follow up visit. They were last seen in clinic March 19, 2021.  He has a history of CRT with an AV nodal ablation on December 11, 2020.       Past Medical History:  Diagnosis Date   Abnormal screening CT of chest    coronary calcium in left main and RCA   Anemia    Arthritis    CHF (congestive heart failure) (HCC)    Complication of anesthesia    Pt reports difficult urinating   Coronary artery calcification seen on CAT scan    no ischemic on nuclear stress test 09/2019   DCM (dilated cardiomyopathy) ([place])    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  Cardiac MRI 2021 with EF 39% and moderate RV dysfunction and non-compaction   Dyspnea    with some activity   GERD (gastroesophageal reflux disease)    occasional   Glaucoma    Hyperlipidemia    statin intolerant at doses > Crestor '5mg'$  3 times weekly   OSA on CPAP    PAF (paroxysmal atrial fibrillation) ([place])    CHADS2VASC score 2 on apixaban    Past Surgical History:  Procedure Laterality Date   AV NODE ABLATION N/A 12/11/2020   Procedure: AV NODE ABLATION;  Surgeon: Joshua Epley, MD;  Location: [place] CV LAB;  Service: Cardiovascular;  Laterality: N/A;   BIV PACEMAKER INSERTION CRT-P N/A 12/11/2020   Procedure: BIV PACEMAKER INSERTION CRT-P;  Surgeon: Joshua Epley, MD;  Location: [place] CV LAB;  Service: Cardiovascular;  Laterality: N/A;   CARDIOVERSION N/A 04/01/2020   Procedure: CARDIOVERSION;  Surgeon: Joshua Latch, MD;  Location: [place];  Service: Cardiovascular;  Laterality: N/A;   CARDIOVERSION N/A 09/08/2020   Procedure: CARDIOVERSION;  Surgeon: Joshua Casino, MD;  Location: [facility] ENDOSCOPY;  Service: Cardiovascular;  Laterality: N/A;   EYE SURGERY     bilateral cataracts   KNEE ARTHROSCOPY W/ DEBRIDEMENT     left    RETINAL DETACHMENT SURGERY Bilateral    RIGHT/LEFT HEART CATH AND CORONARY ANGIOGRAPHY N/A 05/06/2020   Procedure: RIGHT/LEFT HEART CATH AND CORONARY ANGIOGRAPHY;  Surgeon: Joshua Dresser, MD;  Location: [place] CV LAB;  Service: Cardiovascular;  Laterality: N/A;   TOTAL KNEE ARTHROPLASTY Left 01/01/2018   Procedure: LEFT TOTAL KNEE ARTHROPLASTY;  Surgeon: Joshua Arabian, MD;  Location: [facility] ORS;  Service: Orthopedics;  Laterality: Left;  50 mins   TOTAL KNEE ARTHROPLASTY Right 06/25/2018   Procedure: TOTAL KNEE ARTHROPLASTY;  Surgeon: Joshua Arabian, MD;  Location: [facility] ORS;  Service: Orthopedics;  Laterality: Right;  74mn   VASECTOMY      Current Medications: No outpatient medications have been marked as taking for the 03/29/22 encounter (Appointment) with LVickie Epley MD.     Allergies:   Xarelto [rivaroxaban], Empagliflozin, Spironolactone, Penicillins, Statins, and Vancomycin   Social History   Socioeconomic History   Marital status: Married    Spouse name: Not on file   Number of children: Not on file   Years of education: Not on file   Highest education level: Not on file  Occupational History   Not on file  Tobacco Use   Smoking status: Never   Smokeless tobacco: Never  Vaping Use   Vaping Use: Never used  Substance and Sexual Activity   Alcohol use: Yes    Alcohol/week: 1.0 standard drink of alcohol    Types: 1 Cans of beer per week    Comment: Rare   Drug use: No   Sexual activity: Not Currently  Other Topics Concern   Not on file  Social History Narrative   Not on file   Social Determinants of Health   Financial Resource Strain: Not on file  Food Insecurity: Not on file  Transportation Needs: Not on file  Physical Activity: Not on file  Stress: Not on file  Social Connections:  Not on file     Family History: The patient's family history includes Lung cancer in his mother.  ROS:   Please see the history of present illness.    All other systems reviewed and are negative.  EKGs/Labs/Other Studies Reviewed:    The following studies were reviewed today:  Oct 12, 2021 echo EF 35 Global hypokinesis RV mildly reduced Dilated left and right atria Mild MR  EKG:  The ekg ordered today demonstrates ***  Recent Labs: 10/12/2021: B Natriuretic Peptide 91.8; Hemoglobin 13.6; Platelets 197 10/22/2021: BUN 14; Creatinine, Ser 1.03; Potassium 4.3; Sodium 142  Recent Lipid Panel    Component Value Date/Time   CHOL 116 10/12/2021 1030   CHOL 123 01/25/2021 1432   TRIG 49 10/12/2021 1030   HDL 45 10/12/2021 1030   HDL 47 01/25/2021 1432   CHOLHDL 2.6 10/12/2021 1030   VLDL 10 10/12/2021 1030   LDLCALC 61 10/12/2021 1030   LDLCALC 57 01/25/2021 1432   LDLDIRECT 62 11/25/2019 1401    Physical Exam:    VS:  There were no vitals taken for this visit.    Wt Readings from Last 3 Encounters:  01/12/22 285 lb 12.8 oz (129.6 kg)  10/12/21 290 lb 6.4 oz (131.7 kg)  07/22/21 294 lb (133.4 kg)     GEN: *** Well nourished, well developed in no acute distress HEENT: Normal NECK: No JVD; No carotid bruits LYMPHATICS: No lymphadenopathy CARDIAC: ***RRR, no murmurs, rubs, gallops RESPIRATORY:  Clear to auscultation without rales, wheezing or rhonchi  ABDOMEN: Soft, non-tender, non-distended MUSCULOSKELETAL:  No edema; No deformity  SKIN: Warm and dry NEUROLOGIC:  Alert and oriented x 3 PSYCHIATRIC:  Normal affect        ASSESSMENT:    1. Chronic combined systolic and diastolic heart failure (HCC)   2. Cardiac resynchronization therapy pacemaker (CRT-P) in place   3. Permanent atrial fibrillation (HCC)    PLAN:    In order of problems listed above:  #Chronic systolic heart failure NYHA class II-III.  Warm and relatively euvolemic on exam.  He inquires  about a CCM device during today's clinic appointment.  With a CRT-D in situ with multiple leads entering the right atrium, I do not think it is the best decision to proceed with implanting 2 more leads across the tricuspid valve.  I discussed this in detail with the patient who is very understanding.  If his EF were to decrease further, less than 35%, would favor referral for Barostim implant.   #CRT-P in situ #Post AV junction ablation #Permanent atrial fibrillation Device functioning appropriately.  Continue remote monitoring.  Continue Eliquis for stroke prophylaxis.  Follow-up 1 year or sooner as needed.  APP appointment.  Total time spent with patient today *** minutes. This includes reviewing records, evaluating the patient and coordinating care.   Medication Adjustments/Labs and Tests Ordered: Current medicines are reviewed at length with the patient today.  Concerns regarding medicines are outlined above.  No orders of the defined types were placed in this encounter.  No orders of the defined types were placed in this encounter.    Signed, Lars Mage, MD, [facility], [facility] 03/28/2022 8:46 PM    Electrophysiology [facility]

## 2022-03-29 ENCOUNTER — Ambulatory Visit: Payer: PPO | Attending: Cardiology | Admitting: Cardiology

## 2022-03-29 VITALS — BP 98/68 | HR 81 | Ht 71.0 in | Wt 281.0 lb

## 2022-03-29 DIAGNOSIS — Z950 Presence of cardiac pacemaker: Secondary | ICD-10-CM

## 2022-03-29 DIAGNOSIS — I4821 Permanent atrial fibrillation: Secondary | ICD-10-CM | POA: Diagnosis not present

## 2022-03-29 DIAGNOSIS — I5042 Chronic combined systolic (congestive) and diastolic (congestive) heart failure: Secondary | ICD-10-CM | POA: Diagnosis not present

## 2022-03-29 MED ORDER — FUROSEMIDE 20 MG PO TABS: 20.0000 mg | ORAL_TABLET | Freq: Every day | ORAL | 6 refills | Status: DC | PRN

## 2022-03-29 NOTE — Progress Notes (Signed)
Electrophysiology Office Follow up Visit Note:    Date:  03/29/2022   ID:  Joshua Dunker., DOB December 10, 1945, MRN [id number]  PCP:  Lawerance Cruel, MD  [facility] Cardiologist:  [name], MD  [facility] Electrophysiologist:  Vickie Epley, MD    Interval History:    Joshua Sens. is a 76 y.o. male who presents for a follow up visit. They were last seen in clinic March 19, 2021.  He has a history of CRT with an AV nodal ablation on December 11, 2020.  Today, Joshua Moran presents with his wife and states that he doesn't have much stamina; he can go to Joshua grocery store, work in Joshua yard, and pickup heavy things but not for very long.  He had gone on a cruise and noticed greatly increased LE edema. He has been taking lasix to manage which has been effective. He takes his lasix at night to avoid dizziness which we discussed. He is only given lasix 4 at a time and needs more to manage. His BLE edema has improved since  Blood pressure in clinic was measured at 98 systolic and he feels lightheaded when he stands up.  He complains of CRT incision pruritis.  He is interested in CCM whose issues we discussed in conjunction with his CRT.  He denies any palpitations, chest pain, No headaches, syncope, orthopnea, or PND.     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  Cardiac MRI 2021 with EF 39% and moderate RV dysfunction and non-compaction   Dyspnea    with some activity   GERD (gastroesophageal reflux disease)    occasional   Glaucoma    Hyperlipidemia    statin intolerant at doses > Crestor '5mg'$  3  times weekly   OSA on CPAP    PAF (paroxysmal atrial fibrillation) ([place])    CHADS2VASC score 2 on apixaban    Past Surgical History:  Procedure Laterality Date   AV NODE ABLATION N/A 12/11/2020   Procedure: AV NODE ABLATION;  Surgeon: Vickie Epley, MD;  Location: [place] [facility];  Service: Cardiovascular;  Laterality: N/A;   BIV PACEMAKER INSERTION CRT-P N/A 12/11/2020   Procedure: BIV PACEMAKER INSERTION CRT-P;  Surgeon: Vickie Epley, MD;  Location: [place] [facility];  Service: Cardiovascular;  Laterality: N/A;   CARDIOVERSION N/A 04/01/2020   Procedure: CARDIOVERSION;  Surgeon: Skeet Latch, MD;  Location: [place];  Service: Cardiovascular;  Laterality: N/A;   CARDIOVERSION N/A 09/08/2020   Procedure: CARDIOVERSION;  Surgeon: Pixie Casino, MD;  Location: [facility];  Service: Cardiovascular;  Laterality: N/A;   EYE SURGERY     bilateral cataracts   KNEE ARTHROSCOPY W/ DEBRIDEMENT     left    RETINAL DETACHMENT SURGERY Bilateral    RIGHT/LEFT HEART CATH AND CORONARY ANGIOGRAPHY N/A 05/06/2020   Procedure: RIGHT/LEFT HEART CATH AND CORONARY ANGIOGRAPHY;  Surgeon: Larey Dresser, MD;  Location: [place] [facility];  Service: Cardiovascular;  Laterality: N/A;   TOTAL KNEE ARTHROPLASTY Left 01/01/2018  Procedure: LEFT TOTAL KNEE ARTHROPLASTY;  Surgeon: Gaynelle Arabian, MD;  Location: [facility];  Service: Orthopedics;  Laterality: Left;  50 mins   TOTAL KNEE ARTHROPLASTY Right 06/25/2018   Procedure: TOTAL KNEE ARTHROPLASTY;  Surgeon: Gaynelle Arabian, MD;  Location: [facility];  Service: Orthopedics;  Laterality: Right;  79mn   VASECTOMY      Current Medications: Current Meds  Medication Sig   acetaminophen (TYLENOL) 500 MG tablet Take 1,000 mg by mouth every 6 (six) hours as needed (PAIN).   apixaban (ELIQUIS) 5 MG TABS tablet Take 1 tablet (5 mg total) by mouth 2 (two) times daily.   brimonidine (ALPHAGAN) 0.15 % ophthalmic solution Place 1 drop into Joshua right eye at  bedtime.   cetirizine (ZYRTEC) 10 MG tablet Take 10 mg by mouth at bedtime.   Cholecalciferol (VITAMIN D3) 50 MCG (2000 UT) TABS Take 2,000 Units by mouth daily.   eplerenone (INSPRA) 50 MG tablet Take 1 tablet (50 mg total) by mouth daily.   ezetimibe (ZETIA) 10 MG tablet Take 1 tablet (10 mg total) by mouth daily.   ferrous sulfate 325 (65 FE) MG tablet Take 325 mg by mouth in Joshua morning.   fluticasone (FLONASE) 50 MCG/ACT nasal spray Place 2 sprays into both nostrils 2 (two) times daily.    furosemide (LASIX) 20 MG tablet Take 1 tablet (20 mg total) by mouth daily as needed for fluid or edema.   Glucosamine Sulfate 750 MG TABS Take 750 mg by mouth in Joshua morning and at bedtime.   losartan (COZAAR) 25 MG tablet Take 0.5 tablets (12.5 mg total) by mouth at bedtime.   Melatonin 10 MG TABS Take 10 mg by mouth at bedtime.    Multiple Vitamin (MULTIVITAMIN WITH MINERALS) TABS tablet Take 1 tablet by mouth every evening.   rosuvastatin (CRESTOR) 5 MG tablet Take 5 mg by mouth every Monday, Wednesday, and Friday. IN Joshua EVENING   sildenafil (REVATIO) 20 MG tablet Take 40-100 mg by mouth daily as needed (erectile dysfunction.).    tamsulosin (FLOMAX) 0.4 MG CAPS capsule Take 0.4 mg by mouth at bedtime.    [DISCONTINUED] furosemide (LASIX) 20 MG tablet Take 1 tablet (20 mg total) by mouth as directed.     Allergies:   Xarelto [rivaroxaban], Empagliflozin, Spironolactone, Penicillins, Statins, and Vancomycin   Social History   Socioeconomic History   Marital status: Married    Spouse name: Not on file   Number of children: Not on file   Years of education: Not on file   Highest education level: Not on file  Occupational History   Not on file  Tobacco Use   Smoking status: Never   Smokeless tobacco: Never  Vaping Use   Vaping Use: Never used  Substance and Sexual Activity   Alcohol use: Yes    Alcohol/week: 1.0 standard drink of alcohol    Types: 1 Cans of beer per week    Comment:  Rare   Drug use: No   Sexual activity: Not Currently  Other Topics Concern   Not on file  Social History Narrative   Not on file   Social Determinants of Health   Financial Resource Strain: Not on file  Food Insecurity: Not on file  Transportation Needs: Not on file  Physical Activity: Not on file  Stress: Not on file  Social Connections: Not on file     Family History: Joshua Moran's family history includes Lung cancer in his mother.  ROS:   Please  see Joshua history of present illness.   (+)SOB (+)BLE edema (+)lightheadedness (+)dizziness All other systems reviewed and are negative.  EKGs/Labs/Other Studies Reviewed:    Joshua following studies were reviewed today:  Oct 12, 2021 echo EF 35 Global hypokinesis RV mildly reduced Dilated left and right atria Mild MR  March 29, 2022 in clinic device interrogation personally reviewed Underlying rhythm ventricular sensing at 44 BiV pacing 99% Lead parameters stable Battery longevity 6.8 to 7.6 years VVIR 70   Recent Labs: 10/12/2021: B Natriuretic Peptide 91.8; Hemoglobin 13.6; Platelets 197 10/22/2021: BUN 14; Creatinine, Ser 1.03; Potassium 4.3; Sodium 142  Recent Lipid Panel    Component Value Date/Time   CHOL 116 10/12/2021 1030   CHOL 123 01/25/2021 1432   TRIG 49 10/12/2021 1030   HDL 45 10/12/2021 1030   HDL 47 01/25/2021 1432   CHOLHDL 2.6 10/12/2021 1030   VLDL 10 10/12/2021 1030   LDLCALC 61 10/12/2021 1030   LDLCALC 57 01/25/2021 1432   LDLDIRECT 62 11/25/2019 1401    Physical Exam:    VS:  BP 98/68   Pulse 81   Ht '5\' 11"'$  (1.803 m)   Wt 281 lb (127.5 kg)   SpO2 95%   BMI 39.19 kg/m     Wt Readings from Last 3 Encounters:  03/29/22 281 lb (127.5 kg)  01/12/22 285 lb 12.8 oz (129.6 kg)  10/12/21 290 lb 6.4 oz (131.7 kg)     GEN: Well nourished, well developed in no acute distress.  Obese HEENT: Normal NECK: No JVD; No carotid bruits LYMPHATICS: No lymphadenopathy CARDIAC: RRR, no  murmurs, rubs, gallops.  Device pocket well healed RESPIRATORY:  Clear to auscultation without rales, wheezing or rhonchi  ABDOMEN: Soft, non-tender, non-distended MUSCULOSKELETAL:  No edema; No deformity  SKIN: Warm and dry NEUROLOGIC:  Alert and oriented x 3 PSYCHIATRIC:  Normal affect     ASSESSMENT:    1. Chronic combined systolic and diastolic heart failure (HCC)   2. Cardiac resynchronization therapy pacemaker (CRT-P) in place   3. Permanent atrial fibrillation (HCC)    PLAN:    In order of problems listed above:  #Chronic systolic heart failure NYHA class II.  Warm and relatively euvolemic on exam.  He inquires about a CCM device during today's clinic appointment.  We discussed CCM therapy in detail.  We discussed his current CRT system.  We discussed available trial data supporting implant of CCM.  Given Joshua Moran's CRT-D, I do not think he is a suitable candidate for CCM.  I discussed this in detail with Joshua Moran and his wife who were very understanding.   #CRT-P in situ #Post AV junction ablation #Permanent atrial fibrillation Device functioning appropriately.  Continue remote monitoring.  Continue Eliquis for stroke prophylaxis.  Follow-up 1 year or sooner as needed.  APP appointment.  Total time spent with Moran today 35 minutes. This includes reviewing records, evaluating Joshua Moran and coordinating care.   Medication Adjustments/Labs and Tests Ordered: Current medicines are reviewed at length with Joshua Moran today.  Concerns regarding medicines are outlined above.  No orders of Joshua defined types were placed in this encounter.  Meds ordered this encounter  Medications   furosemide (LASIX) 20 MG tablet    Sig: Take 1 tablet (20 mg total) by mouth daily as needed for fluid or edema.    Dispense:  30 tablet    Refill:  6   I,Coren O'Brien,acting as a scribe for Vickie Epley, MD.,have documented  all relevant documentation on Joshua behalf of Vickie Epley, MD,as directed by  Vickie Epley, MD while in Joshua presence of Vickie Epley, MD.  I, Vickie Epley, MD, have reviewed all documentation for this visit. Joshua documentation on 03/29/22 for Joshua exam, diagnosis, procedures, and orders are all accurate and complete.   Signed, Lars Mage, MD, [facility], [facility] 03/29/2022 12:44 PM    Electrophysiology [facility]

## 2022-03-29 NOTE — Patient Instructions (Signed)
Medication Instructions:  none *If you need a refill on your cardiac medications before your next appointment, please call your pharmacy*   Lab Work: none If you have labs (blood work) drawn today and your tests are completely normal, you will receive your results only by: MyChart Message (if you have MyChart) OR A paper copy in the mail If you have any lab test that is abnormal or we need to change your treatment, we will call you to review the results.   Testing/Procedures: none   Follow-Up: At [facility], you and your health needs are our priority.  As part of our continuing mission to provide you with exceptional heart care, we have created designated Provider Care Teams.  These Care Teams include your primary Cardiologist (physician) and Advanced Practice Providers (APPs -  Physician Assistants and Nurse Practitioners) who all work together to provide you with the care you need, when you need it.  We recommend signing up for the patient portal called "MyChart".  Sign up information is provided on this After Visit Summary.  MyChart is used to connect with patients for Virtual Visits (Telemedicine).  Patients are able to view lab/test results, encounter notes, upcoming appointments, etc.  Non-urgent messages can be sent to your provider as well.   To learn more about what you can do with MyChart, go to [email or web address].    Your next appointment:   1 year(s)  The format for your next appointment:   In Person  Provider:   You will see one of the following Advanced Practice Providers on your designated Care Team:   Renee Ursuy, PA-C Michael "Andy" Tillery, PA-C      Other Instructions none  Important Information About Sugar       

## 2022-03-30 LAB — CUP PACEART INCLINIC DEVICE CHECK
Battery Remaining Longevity: 81 mo
Battery Voltage: 3.02 V
Brady Statistic RA Percent Paced: 0 %
Brady Statistic RV Percent Paced: 99 %
Date Time Interrogation Session: 20231024093300
Implantable Lead Connection Status: 753985
Implantable Lead Connection Status: 753985
Implantable Lead Implant Date: 20220708
Implantable Lead Implant Date: 20220708
Implantable Lead Location: 753858
Implantable Lead Location: 753860
Implantable Pulse Generator Implant Date: 20220708
Lead Channel Impedance Value: 1125 Ohm
Lead Channel Impedance Value: 612.5 Ohm
Lead Channel Pacing Threshold Amplitude: 0.5 V
Lead Channel Pacing Threshold Amplitude: 0.5 V
Lead Channel Pacing Threshold Amplitude: 0.75 V
Lead Channel Pacing Threshold Amplitude: 0.75 V
Lead Channel Pacing Threshold Pulse Width: 0.5 ms
Lead Channel Pacing Threshold Pulse Width: 0.5 ms
Lead Channel Pacing Threshold Pulse Width: 0.5 ms
Lead Channel Pacing Threshold Pulse Width: 0.5 ms
Lead Channel Sensing Intrinsic Amplitude: 9.7 mV
Lead Channel Setting Pacing Amplitude: 2.5 V
Lead Channel Setting Pacing Amplitude: 2.5 V
Lead Channel Setting Pacing Pulse Width: 0.5 ms
Lead Channel Setting Pacing Pulse Width: 0.5 ms
Lead Channel Setting Sensing Sensitivity: 4 mV
Pulse Gen Model: 3562
Pulse Gen Serial Number: 3913632

## 2022-04-07 ENCOUNTER — Encounter (HOSPITAL_COMMUNITY): Payer: Self-pay | Admitting: Cardiology

## 2022-04-07 DIAGNOSIS — R051 Acute cough: Secondary | ICD-10-CM | POA: Diagnosis not present

## 2022-04-07 DIAGNOSIS — J069 Acute upper respiratory infection, unspecified: Secondary | ICD-10-CM | POA: Diagnosis not present

## 2022-04-07 DIAGNOSIS — Z03818 Encounter for observation for suspected exposure to other biological agents ruled out: Secondary | ICD-10-CM | POA: Diagnosis not present

## 2022-04-07 DIAGNOSIS — Z6841 Body Mass Index (BMI) 40.0 and over, adult: Secondary | ICD-10-CM | POA: Diagnosis not present

## 2022-04-15 ENCOUNTER — Encounter (HOSPITAL_COMMUNITY): Payer: Self-pay | Admitting: Cardiology

## 2022-04-15 ENCOUNTER — Ambulatory Visit (HOSPITAL_COMMUNITY)
Admission: RE | Admit: 2022-04-15 | Discharge: 2022-04-15 | Disposition: A | Discharge status: Home / Self Care | Payer: PPO | Source: Ambulatory Visit | Attending: Cardiology | Admitting: Cardiology

## 2022-04-15 VITALS — BP 88/50 | HR 70 | Wt 283.4 lb

## 2022-04-15 DIAGNOSIS — I4819 Other persistent atrial fibrillation: Secondary | ICD-10-CM | POA: Insufficient documentation

## 2022-04-15 DIAGNOSIS — I951 Orthostatic hypotension: Secondary | ICD-10-CM | POA: Diagnosis not present

## 2022-04-15 DIAGNOSIS — Z7901 Long term (current) use of anticoagulants: Secondary | ICD-10-CM | POA: Insufficient documentation

## 2022-04-15 DIAGNOSIS — I5022 Chronic systolic (congestive) heart failure: Secondary | ICD-10-CM | POA: Insufficient documentation

## 2022-04-15 DIAGNOSIS — I5042 Chronic combined systolic (congestive) and diastolic (congestive) heart failure: Secondary | ICD-10-CM | POA: Diagnosis not present

## 2022-04-15 DIAGNOSIS — I2510 Atherosclerotic heart disease of native coronary artery without angina pectoris: Secondary | ICD-10-CM | POA: Diagnosis not present

## 2022-04-15 DIAGNOSIS — I429 Cardiomyopathy, unspecified: Secondary | ICD-10-CM | POA: Insufficient documentation

## 2022-04-15 DIAGNOSIS — R0602 Shortness of breath: Secondary | ICD-10-CM | POA: Insufficient documentation

## 2022-04-15 DIAGNOSIS — G4733 Obstructive sleep apnea (adult) (pediatric): Secondary | ICD-10-CM | POA: Diagnosis not present

## 2022-04-15 DIAGNOSIS — Z950 Presence of cardiac pacemaker: Secondary | ICD-10-CM | POA: Insufficient documentation

## 2022-04-15 DIAGNOSIS — Z79899 Other long term (current) drug therapy: Secondary | ICD-10-CM | POA: Diagnosis not present

## 2022-04-15 LAB — BASIC METABOLIC PANEL
Anion gap: 10 (ref 5–15)
BUN: 16 mg/dL (ref 8–23)
CO2: 24 mmol/L (ref 22–32)
Calcium: 8.8 mg/dL — ABNORMAL LOW (ref 8.9–10.3)
Chloride: 106 mmol/L (ref 98–111)
Creatinine, Ser: 1 mg/dL (ref 0.61–1.24)
GFR, Estimated: >60 mL/min (ref >60)
Glucose, Bld: 92 mg/dL (ref 70–99)
Potassium: 3.8 mmol/L (ref 3.5–5.1)
Sodium: 140 mmol/L (ref 135–145)

## 2022-04-15 LAB — LIPID PANEL
Cholesterol: 131 mg/dL (ref 0–200)
HDL: 48 mg/dL (ref >40)
LDL Cholesterol: 65 mg/dL (ref 0–99)
Total CHOL/HDL Ratio: 2.7 RATIO
Triglycerides: 92 mg/dL (ref <150)
VLDL: 18 mg/dL (ref 0–40)

## 2022-04-15 MED ORDER — VERQUVO 2.5 MG PO TABS: 2.5000 mg | ORAL_TABLET | Freq: Every day | ORAL | 11 refills | Status: DC

## 2022-04-15 NOTE — Patient Instructions (Addendum)
START Verquvo 2.'5mg'$  daily.  Labs done today, your results will be available in MyChart, we will contact you for abnormal readings.  Please follow up with our heart failure pharmacist as scheduled.  Your physician recommends that you schedule a follow-up appointment in: 4 months  If you have any questions or concerns before your next appointment please send [place] a message through [place] or call our office at [phone].    TO LEAVE A MESSAGE FOR THE NURSE SELECT OPTION 2, PLEASE LEAVE A MESSAGE INCLUDING: YOUR NAME DATE OF BIRTH CALL BACK NUMBER REASON FOR CALL**this is important as we prioritize the call backs  YOU WILL RECEIVE A CALL BACK THE SAME DAY AS LONG AS YOU CALL BEFORE 4:00 PM  At the [facility], you and your health needs are our priority. As part of our continuing mission to provide you with exceptional heart care, we have created designated Provider Care Teams. These Care Teams include your primary Cardiologist (physician) and Advanced Practice Providers (APPs- Physician Assistants and Nurse Practitioners) who all work together to provide you with the care you need, when you need it.   You may see any of the following providers on your designated Care Team at your next follow up: Dr Glori Bickers Dr Loralie Champagne Dr. Roxana Hires, NP Lyda Jester, [place] [facility], [place] Forestine Na, NP Audry Riles, PharmD   Please be sure to bring in all your medications bottles to every appointment.

## 2022-04-17 NOTE — Progress Notes (Signed)
PCP: Lawerance Cruel, MD Cardiology: Dr. Radford Pax HF Cardiology: Dr. Aundra Dubin  76 y.o. with history of chronic systolic CHF and paroxysmal atrial fibrillation was referred by Dr. Radford Pax for evaluation of CHF.  Patient has been short of breath for years, worse over the last 6 months.  He has had an extensive workup.  Echo in 3/21 showed EF 45-50% with mildly decreased RV function.  Cardiolite in 4/21 showed fixed apical defect.  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  PYP scan in 8/21 was not suggestive of TTR cardiac amyloidosis.   He had been in persistent atrial fibrillation for about 2 years.  He saw Dr. Quentin Ore and was started on amiodarone.  DCCV was done in 10/21 back to NSR.  He was found to have M-spike on SPEP.  He saw Dr. Marin Olp who thought this was MGUS.   LHC/RHC in 12/21 showed occluded small OM2 and 80-90% stenosis mid PLOM (small caliber), no interventional target; normal filling pressures and preserved cardiac output.   He was unable to tolerate Jardiance due to extreme dizziness. He is off both Coreg and Toprol XL due to lightheadedness/hypotension.   He developed recurrent atrial fibrillation and failed DCCV in 4/22.  He has been in persistent atrial fibrillation, Dr Quentin Ore stopped his amiodarone due to futility.  In 7/22, he had AV nodal ablation with St Jude CRT-P device placement.   Echo in 5/22 showed EF 35-40%, mild LV enlargement, normal RV.  Echo in 5/23 showed EF 35-40%, diffuse hypokinesis,   He returns for HF follow up with his wife.  Weight is down 2 lbs.  He has not used Lasix recently.  BP is low today but he has not been lightheaded recently.  Generally, no dyspnea walking on flat ground.  He gets short of breath if he has to go a long distance or up hills. No chest pain.  No orthopnea/PND.    Labs (7/21): Urine immunofixation with monoclonal IgG protein, SPEP with M-spike.    Labs (10/21): TSH normal, K 4, creatinine 0.97, LFTs normal Labs (11/21): K 4.8, creatinine 1.07, hgb 14.7, LFTs normal, TSH normal Labs (12/21): LDL 66, HDL 45 Labs (1/22): K 3.9, creatinine 1.07 Labs (4/22): K 4.5, creatinine 1.14, TSH normal, hgb 13.8 Labs (7/22): K 4.5, creatinine 1.0 Labs (8/22): LDL 57 Labs (10/22): K 4.5, creatinine 1.07 Labs (3/23): K 3.8, creatinine 0.99 Labs (8/23): creatinine 0.77, LDL 65  St Jude device interrogation: 98% BiV paced, no VT, stable thoracic impedance.    PMH: 1. CVA: Occurred in 3/21, he was not on apixaban at the time.  2. GERD 3. OSA: Uses CPAP.  4. Hyperlipidemia 5. Atrial fibrillation: Paroxysmal.  - DCCV to NSR in 10/21.  - Failed DCCV in 4/22, stopped amiodarone.  - AV nodal ablation with St Jude CRT-P in 7/22.  6. MGUS 7. Chronic systolic CHF: Echo (2/58) with EF 45-50%, mild LVH, mildly decreased RV systolic function. Primarily nonischemic cardiomyopathy.  - Cardiolite (4/21): Fixed apical defect.  - Cardiac MRI (9/21): Mild LV dilation with mild LV hypertrophy, EF 39%, prominent trabeculations concerning for noncompaction. Mild RV dilation with EF 28%, severe LAE.  No delayed enhancement.  - PYP scan (8/21): grade 1, H/CL 1-1.5.  Unlikely to have cardiac amyloidosis.  - LHC/RHC (12/21): small OM2 totally occluded, 80-90% mid small PLOM (no interventional target); mean RA 5, PA 25/9, mean PCWP 5, CI 2.77  Fick/3.29 Thermo.  - St Jude CRT-P  - Echo (5/23): EF 35-40%, diffuse hypokinesis, mildly decreased RV systolic function, IVC normal.  8. Orthostatic hypotension.  9. CAD: Coronary angiography 12/21 with small OM2 totally occluded, 80-90% mid small PLOM (no interventional target) 10. Carotid ultrasound (8/22): mild BICA stenosis.  11. COVID-19 9/22 12. PVCs  Social History   Socioeconomic History   Marital status: Married    Spouse name: Not on file   Number of children: Not on file   Years of education: Not on file    Highest education level: Not on file  Occupational History   Not on file  Tobacco Use   Smoking status: Never   Smokeless tobacco: Never  Vaping Use   Vaping Use: Never used  Substance and Sexual Activity   Alcohol use: Yes    Alcohol/week: 1.0 standard drink of alcohol    Types: 1 Cans of beer per week    Comment: Rare   Drug use: No   Sexual activity: Not Currently  Other Topics Concern   Not on file  Social History Narrative   Not on file   Social Determinants of Health   Financial Resource Strain: Not on file  Food Insecurity: Not on file  Transportation Needs: Not on file  Physical Activity: Not on file  Stress: Not on file  Social Connections: Not on file  Intimate Partner Violence: Not on file   Family History  Problem Relation Age of Onset   Lung cancer Mother        smoker   ROS: All systems reviewed and negative except as per HPI.   Current Outpatient Medications  Medication Sig Dispense Refill   acetaminophen (TYLENOL) 500 MG tablet Take 1,000 mg by mouth every 6 (six) hours as needed (PAIN).     apixaban (ELIQUIS) 5 MG TABS tablet Take 1 tablet (5 mg total) by mouth 2 (two) times daily. 60 tablet 0   cetirizine (ZYRTEC) 10 MG tablet Take 10 mg by mouth at bedtime.     Cholecalciferol (VITAMIN D3) 50 MCG (2000 UT) TABS Take 2,000 Units by mouth daily.     eplerenone (INSPRA) 50 MG tablet Take 1 tablet (50 mg total) by mouth daily. 90 tablet 3   ezetimibe (ZETIA) 10 MG tablet Take 1 tablet (10 mg total) by mouth daily. 90 tablet 3   ferrous sulfate 325 (65 FE) MG tablet Take 325 mg by mouth in the morning.     fluticasone (FLONASE) 50 MCG/ACT nasal spray Place 2 sprays into both nostrils 2 (two) times daily.      furosemide (LASIX) 20 MG tablet Take 1 tablet (20 mg total) by mouth daily as needed for fluid or edema. 30 tablet 6   Glucosamine Sulfate 750 MG TABS Take 750 mg by mouth in the morning and at bedtime.     losartan (COZAAR) 25 MG tablet Take 0.5  tablets (12.5 mg total) by mouth at bedtime. 90 tablet 3   Melatonin 10 MG TABS Take 10 mg by mouth at bedtime.      Multiple Vitamin (MULTIVITAMIN WITH MINERALS) TABS tablet Take 1 tablet by mouth every evening.     rosuvastatin (CRESTOR) 5 MG tablet Take 5 mg by mouth every Monday, Wednesday, and Friday. IN THE EVENING     sildenafil (REVATIO) 20 MG tablet Take 40-100 mg by mouth daily as needed (erectile dysfunction.).   5   tamsulosin (FLOMAX) 0.4 MG CAPS capsule Take 0.4 mg by  mouth at bedtime.      Vericiguat (VERQUVO) 2.5 MG TABS Take 2.5 mg by mouth daily. 30 tablet 11   No current facility-administered medications for this encounter.   BP (!) 88/50   Pulse 70   Wt 128.5 kg (283 lb 6.4 oz)   SpO2 96%   BMI 39.53 kg/m  Wt Readings from Last 3 Encounters:  04/15/22 128.5 kg (283 lb 6.4 oz)  03/29/22 127.5 kg (281 lb)  01/12/22 129.6 kg (285 lb 12.8 oz)   General: NAD Neck: No JVD, no thyromegaly or thyroid nodule.  Lungs: Clear to auscultation bilaterally with normal respiratory effort. CV: Nondisplaced PMI.  Heart regular S1/S2, no S3/S4, no murmur.  No peripheral edema.  No carotid bruit.  Normal pedal pulses.  Abdomen: Soft, nontender, no hepatosplenomegaly, no distention.  Skin: Intact without lesions or rashes.  Neurologic: Alert and oriented x 3.  Psych: Normal affect. Extremities: No clubbing or cyanosis.  HEENT: Normal.   Assessment/Plan: 1. Chronic systolic CHF: Cardiac MRI in 9/21 with EF 39% and evidence for noncompaction cardiomyopathy, no delayed enhancement, significant RV dysfunction with RV EF 22%.  PYP scan was not suggestive of TTR amyloidosis and no symptoms or peripheral neuropathy.  12/21 LHC showed CAD but does not explain cardiomyopathy, no interventional target.  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  Echo 10/2021 showed  EF 35-40%, diffuse hypokinesis, mildly decreased RV systolic function, IVC normal. He has a history of  orthostatic hypotension which has limited medication titration but this is doing better since AV nodal ablation with St Jude CRT-P.  He is not volume overloaded by exam or Corvue.  NYHA class II symptoms.  - Continue Lasix prn.  - He did not tolerate Jardiance or Farxiga due to dizziness.  - Continue eplerenone to 50 mg daily.  BMET today.  - Continue losartan 12.5 mg at bed time due to hypotension. Suspect BP will not tolerate transition to Entresto.  - Unable to tolerate Coreg or Toprol XL due to lightheadedness/low BP.  - Start Verquvo 2.5 mg daily, titrate up if tolerated.   - Not thought to be candidate for cardiac contractility modulation due to CRT.  2. Atrial fibrillation: Persistent.  He has severe LAE. Not good candidate for atrial fibrillation ablation per EP.  He failed DCCV on amiodarone in 4/22, amiodarone stopped.  AV nodal ablation with St Jude CRT-P in 7/22.  - Continue apixaban 5 mg bid.  3. OSA: Continue CPAP.  4. Orthostatic hypotension: Limits medications.   - Continue compression stockings and follow over time.   5. CAD: Moderate disease on cath in 12/21, no intervention target.  No chest pain.  Extent of disease does not explain cardiomyopathy.  No chest pain.  - He has been off [place]. Continue Crestor, check lipids today.  - He is on apixaban with stable CAD, so no ASA.   Followup HF pharmacist in 3 wks for med titration, see APP in 3-4 months.   Loralie Champagne 04/17/2022

## 2022-04-19 DIAGNOSIS — H903 Sensorineural hearing loss, bilateral: Secondary | ICD-10-CM | POA: Diagnosis not present

## 2022-04-19 DIAGNOSIS — H9313 Tinnitus, bilateral: Secondary | ICD-10-CM | POA: Diagnosis not present

## 2022-04-19 DIAGNOSIS — J0101 Acute recurrent maxillary sinusitis: Secondary | ICD-10-CM | POA: Diagnosis not present

## 2022-04-19 DIAGNOSIS — J343 Hypertrophy of nasal turbinates: Secondary | ICD-10-CM | POA: Diagnosis not present

## 2022-04-19 DIAGNOSIS — J342 Deviated nasal septum: Secondary | ICD-10-CM | POA: Diagnosis not present

## 2022-04-19 DIAGNOSIS — R42 Dizziness and giddiness: Secondary | ICD-10-CM | POA: Diagnosis not present

## 2022-04-25 ENCOUNTER — Ambulatory Visit (INDEPENDENT_AMBULATORY_CARE_PROVIDER_SITE_OTHER): Payer: PPO

## 2022-04-25 ENCOUNTER — Ambulatory Visit: Payer: PPO | Admitting: Cardiology

## 2022-04-25 DIAGNOSIS — Z950 Presence of cardiac pacemaker: Secondary | ICD-10-CM | POA: Diagnosis not present

## 2022-04-25 DIAGNOSIS — I5042 Chronic combined systolic (congestive) and diastolic (congestive) heart failure: Secondary | ICD-10-CM | POA: Diagnosis not present

## 2022-04-27 ENCOUNTER — Other Ambulatory Visit (HOSPITAL_COMMUNITY): Payer: Self-pay

## 2022-04-27 NOTE — Progress Notes (Signed)
EPIC Encounter for ICM Monitoring  Patient Name: Joshua Moran. is a 76 y.o. male Date: 04/27/2022 Primary Care Physican: Lawerance Cruel, MD Primary Cardiologist: Aundra Dubin Electrophysiologist: Quentin Ore 01/12/2022 Office Weight: 285 lbs  04/15/2022 Office Weight: 283 lbs                                                            Spoke with patient and heart failure questions reviewed.  Transmission results reviewed.  Pt asymptomatic for fluid accumulation.  Reports feeling well at this time and voices no complaints.     CorVue thoracic impedance suggesting normal fluid levels with exception of possible fluid accumulation 11/1-11/5.    Prescribed:  Furosemide 20 mg take 1 tablet(s) (20 mg total) by mouth as directed (as needed).   Recommendations:  No changes and encouraged to call if experiencing any fluid symptoms.   Follow-up plan: ICM clinic phone appointment on 05/31/2022.   91 day device clinic remote transmission 06/13/2022.     EP/Cardiology Office Visits:  08/08/2022 with HF clinic.  Recall 03/24/2023 with Dr Quentin Ore.    Copy of ICM check sent to Dr. Quentin Ore.    3 month ICM trend: 04/25/2022.    12-14 Month ICM trend:     Rosalene Billings, RN 04/27/2022 10:40 AM

## 2022-04-27 NOTE — Progress Notes (Signed)
PCP: Lawerance Cruel, MD Cardiology: Dr. Radford Pax HF Cardiology: Dr. Aundra Dubin  HPI:  76 y.o. with history of chronic systolic CHF and paroxysmal atrial fibrillation was referred by Dr. Radford Pax for evaluation of CHF.  Patient had been short of breath for years, worse over the last 6 months.  He has had an extensive workup.  Echo in 08/2019 showed EF 45-50% with mildly decreased RV function.  Cardiolite in 09/2019 showed fixed apical defect.  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  PYP scan in 01/2020 was not suggestive of TTR cardiac amyloidosis.   He had been in persistent atrial fibrillation for about 2 years.  He saw Dr. Quentin Ore and was started on amiodarone.  DCCV was done in 03/2020 back to [facility]. He was found to have M-spike on SPEP.  He saw Dr. Marin Olp who thought this was MGUS.    LHC/RHC in 05/2020 showed occluded small OM2 and 80-90% stenosis mid PLOM (small caliber), no interventional target; normal filling pressures and preserved cardiac output.    He was unable to tolerate Jardiance due to extreme dizziness. He was taken off both carvedilol and metoprolol XL due to lightheadedness/hypotension.    He developed recurrent atrial fibrillation and failed DCCV in 09/2020.  He had been in persistent atrial fibrillation, Dr Quentin Ore stopped his amiodarone due to futility.  In 12/2020, he had AV nodal ablation with St Jude CRT-P device placement.    Echo in 10/2020 showed EF 35-40%, mild LV enlargement, normal RV.  Echo in 10/2021 showed EF 35-40%, diffuse hypokinesis.   Recently returned to HF clinic for follow up with Dr. Aundra Dubin on 04/15/22.  Weight was down 2 lbs.  He had not used Lasix recently.  BP was low in clinic but he had not been lightheaded recently.  Generally, no dyspnea walking on flat ground.  He reported getting short of breath if he has to go a long distance or up hills. No chest pain.  No  orthopnea/PND.     Today he returns to HF clinic for pharmacist medication titration. At last visit with MD, Truett Mainland 2.5 mg daily was initiated. Overall feeling ok today. Notes that after starting Verquvo he developed significant drowsiness. He falls asleep during the day more frequently which is unusual for him. He states that the drowsiness is not as bad as last week when he called the triage line to report the symptom. He will fall asleep and nap if he gets still during the day. He has been wearing his CPAP every night. BP at home is lower in the mornings, SBP 100-104 and higher in the evenings, SBP ~120-122. He takes both losartan and Verquvo at night. States he has noted more episodes of dizziness since starting the [facility]. These episodes occur in the morning and coincide with his SBP being close to 100. He states the dizziness is not as bad as when he was taking carvedilol/metoprolol. Generally, no dyspnea walking on flat ground. Is able to walk on the treadmill for 30 minutes. Weight at home has been stable at 273-280 lbs. He has Lasix for PRN use, usually needs ~once per month. Has 1+ bilateral LEE, which is stable for him. He is wearing compression stockings. No PND or orthopnea.    HF Medications: Losartan 12.5 mg daily Eplerenone 50 mg daily Verquvo 2.5 mg daily Lasix 20 mg PRN  Has the patient been experiencing any side effects to  the medications prescribed?  no  Does the patient have any problems obtaining medications due to transportation or finances?   Has [employer] Medicare. Obtained [employer] grant to help with cost of Verquvo.   Understanding of regimen: good Understanding of indications: good Potential of compliance: good Patient understands to avoid NSAIDs. Patient understands to avoid decongestants.    Pertinent Lab Values: 04/15/22: Serum creatinine 1.00, BUN 16, Potassium 3.8, Sodium 140   Vital Signs:  Weight: 284.8 lbs (last clinic weight: 283.4  lbs) Blood pressure: 112/76 Heart rate: 95   Assessment/Plan: 1. Chronic systolic CHF: Cardiac MRI in 02/2020 with EF 39% and evidence for noncompaction cardiomyopathy, no delayed enhancement, significant RV dysfunction with RV EF 22%.  PYP scan was not suggestive of TTR amyloidosis and no symptoms or peripheral neuropathy.  05/2020 LHC showed CAD but does not explain cardiomyopathy, no interventional target.  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  Echo 10/2021 showed  EF 35-40%, diffuse hypokinesis, mildly decreased RV systolic function, IVC normal. He has a history of orthostatic hypotension which has limited medication titration but this is doing better since AV nodal ablation with St Jude CRT-P.    - NYHA class II symptoms. Euvolemic on exam.  - Continue Lasix 20 mg PRN.  - Continue losartan 12.5 mg at bed time due to hypotension. Suspect BP will not tolerate transition to Entresto.  - Continue eplerenone 50 mg daily.  - Continue Verquvo 2.5 mg daily. Will not increase today given increased dizziness and drowsiness since starting the [facility]. Patient is amenable to continuing the 2.5 mg daily dose.   - Unable to tolerate carvedilol or metoprolol XL due to lightheadedness/low BP.  - He did not tolerate Jardiance or Farxiga due to dizziness.  - Not thought to be candidate for cardiac contractility modulation due to CRT.  2. Atrial fibrillation: Persistent.  He has severe LAE. Not good candidate for atrial fibrillation ablation per EP.  He failed DCCV on amiodarone in 09/2020, amiodarone stopped.  AV nodal ablation with St Jude CRT-P in 12/2020.  - Continue apixaban 5 mg BID.  3. OSA: Continue CPAP.  4. Orthostatic hypotension: Limits medications.   - Continue compression stockings and follow over time.   5. CAD: Moderate disease on cath in 05/2020, no intervention target.  No chest pain.  Extent of disease does not explain cardiomyopathy.  No chest pain.  - He has  been off Nye. Continue Crestor. - He is on apixaban with stable CAD, so no ASA.    Audry Riles, PharmD, BCPS, BCCP, CPP Heart Failure Clinic Pharmacist [phone]

## 2022-05-02 ENCOUNTER — Encounter (HOSPITAL_COMMUNITY): Payer: Self-pay

## 2022-05-02 NOTE — Telephone Encounter (Signed)
Spoke with patient. Since starting the [facility] he has been very drowsy. Says he now sleeps great at night but will quickly fall asleep during the day, which is not normal for him. No severe fatigue or weakness. Says his BP at home has been running lower than his normal, but the average SBP is 118. He states that he did have more dizziness after starting the [facility] but that has now resolved. After discussion with patient, he would like to continue [facility] for now. He will record his home BP and bring log to his visit in pharmacy clinic next week. At that point, will discuss whether to continue [facility] or not.

## 2022-05-10 DIAGNOSIS — R42 Dizziness and giddiness: Secondary | ICD-10-CM | POA: Diagnosis not present

## 2022-05-11 ENCOUNTER — Other Ambulatory Visit (HOSPITAL_COMMUNITY): Payer: Self-pay

## 2022-05-11 ENCOUNTER — Telehealth (HOSPITAL_COMMUNITY): Payer: Self-pay | Admitting: Pharmacist

## 2022-05-11 NOTE — Telephone Encounter (Signed)
Patient Advocate Encounter   Received notification from [employer] that prior authorization for Truett Mainland is required.   PA submitted on CoverMyMeds Key [id number] Status is pending   Will continue to follow.   Audry Riles, PharmD, BCPS, BCCP, CPP Heart Failure Clinic Pharmacist [phone]

## 2022-05-12 ENCOUNTER — Ambulatory Visit (HOSPITAL_COMMUNITY)
Admission: RE | Admit: 2022-05-12 | Discharge: 2022-05-12 | Disposition: A | Discharge status: Home / Self Care | Payer: PPO | Source: Ambulatory Visit | Attending: Cardiology | Admitting: Cardiology

## 2022-05-12 ENCOUNTER — Telehealth (HOSPITAL_COMMUNITY): Payer: Self-pay | Admitting: Pharmacy Technician

## 2022-05-12 ENCOUNTER — Other Ambulatory Visit (HOSPITAL_COMMUNITY): Payer: Self-pay

## 2022-05-12 VITALS — BP 112/76 | HR 95 | Wt 284.8 lb

## 2022-05-12 DIAGNOSIS — I951 Orthostatic hypotension: Secondary | ICD-10-CM | POA: Diagnosis not present

## 2022-05-12 DIAGNOSIS — I2510 Atherosclerotic heart disease of native coronary artery without angina pectoris: Secondary | ICD-10-CM | POA: Insufficient documentation

## 2022-05-12 DIAGNOSIS — I5022 Chronic systolic (congestive) heart failure: Secondary | ICD-10-CM | POA: Diagnosis not present

## 2022-05-12 DIAGNOSIS — G4733 Obstructive sleep apnea (adult) (pediatric): Secondary | ICD-10-CM | POA: Insufficient documentation

## 2022-05-12 DIAGNOSIS — I4819 Other persistent atrial fibrillation: Secondary | ICD-10-CM | POA: Diagnosis not present

## 2022-05-12 DIAGNOSIS — I480 Paroxysmal atrial fibrillation: Secondary | ICD-10-CM | POA: Diagnosis not present

## 2022-05-12 MED ORDER — VERQUVO 2.5 MG PO TABS: 2.5000 mg | ORAL_TABLET | Freq: Every day | ORAL | 11 refills | Status: DC

## 2022-05-12 NOTE — Telephone Encounter (Signed)
Advanced Heart Failure Patient Advocate Encounter  Prior Authorization for Joshua Moran has been approved.    PA# [id number] Effective dates: 05/11/22 through 05/12/23  Patients co-pay is $147 (30 days)

## 2022-05-12 NOTE — Telephone Encounter (Signed)
Advanced Heart Failure Patient Advocate Encounter  Called and spoke with the patient. Emailed grant information to take to the pharmacy.  Charlann Boxer, CPhT

## 2022-05-12 NOTE — Patient Instructions (Signed)
It was a pleasure seeing you today!  MEDICATIONS: -No medication changes today -Call if you have questions about your medications.   NEXT APPOINTMENT: Return to clinic in 3 months with [facility].  In general, to take care of your heart failure: -Limit your fluid intake to 2 Liters (half-gallon) per day.   -Limit your salt intake to ideally 2-3 grams (2000-3000 mg) per day. -Weigh yourself daily and record, and bring that "weight diary" to your next appointment.  (Weight gain of 2-3 pounds in 1 day typically means fluid weight.) -The medications for your heart are to help your heart and help you live longer.   -Please contact us before stopping any of your heart medications.  Call the clinic at [phone] with questions or to reschedule future appointments.

## 2022-05-12 NOTE — Telephone Encounter (Signed)
Advanced Heart Failure Patient Advocate Encounter  The patient was approved for a [employer] grant that will help cover the cost of Verquvo. Total amount awarded, $10,000. Eligibility, 04/12/22 - 04/12/23.  ID [id number]  BIN [id number]  PCN [id number]  Group [id number]

## 2022-05-16 ENCOUNTER — Other Ambulatory Visit (HOSPITAL_COMMUNITY): Payer: Self-pay | Admitting: Pharmacist

## 2022-05-16 MED ORDER — LOSARTAN POTASSIUM 25 MG PO TABS: 12.5000 mg | ORAL_TABLET | Freq: Every day | ORAL | 3 refills | Status: DC

## 2022-05-17 DIAGNOSIS — R42 Dizziness and giddiness: Secondary | ICD-10-CM | POA: Diagnosis not present

## 2022-05-17 DIAGNOSIS — J310 Chronic rhinitis: Secondary | ICD-10-CM | POA: Diagnosis not present

## 2022-05-17 DIAGNOSIS — J342 Deviated nasal septum: Secondary | ICD-10-CM | POA: Diagnosis not present

## 2022-05-17 DIAGNOSIS — H903 Sensorineural hearing loss, bilateral: Secondary | ICD-10-CM | POA: Diagnosis not present

## 2022-05-17 DIAGNOSIS — H832X3 Labyrinthine dysfunction, bilateral: Secondary | ICD-10-CM | POA: Diagnosis not present

## 2022-05-17 DIAGNOSIS — J343 Hypertrophy of nasal turbinates: Secondary | ICD-10-CM | POA: Diagnosis not present

## 2022-05-31 ENCOUNTER — Ambulatory Visit (INDEPENDENT_AMBULATORY_CARE_PROVIDER_SITE_OTHER): Payer: PPO

## 2022-05-31 DIAGNOSIS — Z950 Presence of cardiac pacemaker: Secondary | ICD-10-CM | POA: Diagnosis not present

## 2022-05-31 DIAGNOSIS — I5022 Chronic systolic (congestive) heart failure: Secondary | ICD-10-CM

## 2022-06-01 ENCOUNTER — Ambulatory Visit: Payer: PPO | Attending: Otolaryngology

## 2022-06-01 DIAGNOSIS — R42 Dizziness and giddiness: Secondary | ICD-10-CM | POA: Insufficient documentation

## 2022-06-01 DIAGNOSIS — R2681 Unsteadiness on feet: Secondary | ICD-10-CM | POA: Insufficient documentation

## 2022-06-01 NOTE — Therapy (Signed)
OUTPATIENT PHYSICAL THERAPY VESTIBULAR EVALUATION     Patient Name: Joshua Moran. MRN: [id number] DOB:November 21, 1945, 76 y.o., male Today's Date: 06/01/2022  END OF SESSION:  PT End of Session - 06/01/22 1010     Visit Number 1    Number of Visits 5    Date for PT Re-Evaluation 07/13/22    Authorization Type [employer] advantage    PT Start Time 1015    PT Stop Time 1055    PT Time Calculation (min) 40 min    Activity Tolerance Patient tolerated treatment well   limited by low BP   Behavior During Therapy WFL for tasks assessed/performed             Past Medical History:  Diagnosis Date   Abnormal screening CT of chest    coronary calcium in left main and RCA   Anemia    Arthritis    CHF (congestive heart failure) (HCC)    Complication of anesthesia    Pt reports difficult urinating   Coronary artery calcification seen on CAT scan    no ischemic on nuclear stress test 09/2019   DCM (dilated cardiomyopathy) ([place])    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  Cardiac MRI 2021 with EF 39% and moderate RV dysfunction and non-compaction   Dyspnea    with some activity   GERD (gastroesophageal reflux disease)    occasional   Glaucoma    Hyperlipidemia    statin intolerant at doses > Crestor '5mg'$  3 times weekly   OSA on CPAP    PAF (paroxysmal atrial fibrillation) ([place])    CHADS2VASC score 2 on apixaban   Past Surgical History:  Procedure Laterality Date   AV NODE ABLATION N/A 12/11/2020   Procedure: AV NODE ABLATION;  Surgeon: Vickie Epley, MD;  Location: [place] [facility];  Service: Cardiovascular;  Laterality: N/A;   BIV PACEMAKER INSERTION CRT-P N/A 12/11/2020   Procedure: BIV PACEMAKER INSERTION CRT-P;  Surgeon: Vickie Epley, MD;  Location: [place] [facility];  Service: Cardiovascular;  Laterality: N/A;   CARDIOVERSION N/A 04/01/2020   Procedure: CARDIOVERSION;  Surgeon: Skeet Latch, MD;  Location: [place];  Service: Cardiovascular;  Laterality: N/A;    CARDIOVERSION N/A 09/08/2020   Procedure: CARDIOVERSION;  Surgeon: Pixie Casino, MD;  Location: [facility];  Service: Cardiovascular;  Laterality: N/A;   EYE SURGERY     bilateral cataracts   KNEE ARTHROSCOPY W/ DEBRIDEMENT     left    RETINAL DETACHMENT SURGERY Bilateral    RIGHT/LEFT HEART CATH AND CORONARY ANGIOGRAPHY N/A 05/06/2020   Procedure: RIGHT/LEFT HEART CATH AND CORONARY ANGIOGRAPHY;  Surgeon: Larey Dresser, MD;  Location: [place] [facility];  Service: Cardiovascular;  Laterality: N/A;   TOTAL KNEE ARTHROPLASTY Left 01/01/2018   Procedure: LEFT TOTAL KNEE ARTHROPLASTY;  Surgeon: Gaynelle Arabian, MD;  Location: [facility];  Service: Orthopedics;  Laterality: Left;  50 mins   TOTAL KNEE ARTHROPLASTY Right 06/25/2018   Procedure: TOTAL KNEE ARTHROPLASTY;  Surgeon: Gaynelle Arabian, MD;  Location: [facility];  Service: Orthopedics;  Laterality: Right;  106mn   VASECTOMY     Patient Active Problem List   Diagnosis Date Noted   Atrial fibrillation ([place] 12/11/2020   Persistent atrial fibrillation (HCC)    DCM (dilated cardiomyopathy) ([place]    Coronary artery calcification seen on CAT scan    OSA on CPAP    Acute ischemic stroke ([place] 08/06/2019   Obesity 07/24/2018   Paroxysmal atrial fibrillation ([place]  04/24/2018   Chest pain 04/24/2018   OA (osteoarthritis) of knee 01/01/2018   Dyspnea 09/19/2017   Primary open angle glaucoma of right eye, indeterminate stage 06/10/2015   Dyslipidemia, goal LDL below 100 05/05/2015   Abnormal CT scan of heart 05/05/2015   Recent retinal detachment, partial, with giant tear, bilateral 11/24/2011   Status post intraocular lens implant 11/24/2011    PCP: Lawerance Cruel, MD REFERRING PROVIDER: Raylene Miyamoto, MD  REFERRING DIAG: R42 (ICD-10-CM) - Dizziness  THERAPY DIAG:  Dizziness and giddiness  Unsteadiness on feet  ONSET DATE: 05/25/2022 referral  Rationale for Evaluation and Treatment: Rehabilitation  SUBJECTIVE:   SUBJECTIVE  STATEMENT: Patient presents to clinic by himself. Reports every once in a while he'll have vertigo where he wakes up and things are moving. Reports having "stability problems." Reports this all started several years ago. Most recently had slight dizziness 1 week ago. States he'll feel the dizziness when he first opens his eyes. Patient reports having more "dizziness in the morning due to low blood pressure." States that his "vertigo and dizziness are 2 different things." The vertigo he experiences is room spinning when he first wakes up and this can last up to a few days, but he is able to function around his house despite that. The dizziness he experiences related to BP drops resolves with sitting/putting his head down.  Pt accompanied by: self  PERTINENT HISTORY: L frontal lobe subcortical white matter infarct , longstanding h/o vertigo, combined systolic/diastolic HF,   PAIN:  Are you having pain? No  PRECAUTIONS: Fall, ICD/Pacemaker, and Other: CHECK BLOOD PRESSURE  WEIGHT BEARING RESTRICTIONS: No  FALLS: Has patient fallen in last 6 months? No  LIVING ENVIRONMENT: Lives with: lives with their family Lives in: House/apartment Stairs: Yes: External: "a few" steps; none Has following equipment at home: Single point cane, Walker - 2 wheeled, Crutches, and Grab bars  PLOF: Independent still driving   PATIENT GOALS: "to improve my balance"   OBJECTIVE:   DIAGNOSTIC FINDINGS:  CTA head & neck no LVO, mild atherosclerosis head and neck. 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 Sinus dz 2D Echo ejection fraction 45 to 50%.  Mild left ventricular hypertrophy.  No cardiac source of embolism.    COGNITION: Overall cognitive status: Within functional limits for tasks assessed   SENSATION: WFL  EDEMA:  Reports "very little"    POSTURE:  rounded shoulders, forward head,  increased thoracic kyphosis, posterior pelvic tilt, and flexed trunk   Cervical ROM:    Generally limited, but no increase in s/s  VBI: clear  STRENGTH: WFL   BED MOBILITY:  Reports no increase in vertigo symptoms and able to complete independently   GAIT: Gait pattern: WFL Distance walked: clinic Assistive device utilized: None Level of assistance: Complete Independence Comments: generally slow gait speed  PATIENT SURVEYS:  FOTO 50  VESTIBULAR ASSESSMENT:  GENERAL OBSERVATION: NAD   SYMPTOM BEHAVIOR:  Subjective history: see above  Non-Vestibular symptoms:  none  Type of dizziness: "World moves"  Frequency: when he first wakes up, sporadic (rarely)   Duration: a few mins to a few days   Aggravating factors: Spontaneous and Worse in the morning  Relieving factors: lying supine, closing eyes, and jostling his head around  Progression of symptoms: unchanged  OCULOMOTOR EXAM:  Ocular Alignment: normal wears bifocals   Ocular ROM: No Limitations  Spontaneous Nystagmus: absent  Gaze-Induced Nystagmus: absent  Smooth Pursuits: intact  Saccades: intact  Convergence/Divergence: <5 cm   VESTIBULAR - OCULAR REFLEX:   Slow VOR: Normal  VOR Cancellation: Corrective Saccades  Head-Impulse Test: HIT Right: positive HIT Left: positive  Dynamic Visual Acuity:  to be assessed Test of Skew: (-) B  MOTION SENSITIVITY:  To be assessed   OTHOSTATICS: seated: 80/47; standing: 65/37 (wearing knee-high compression socks)    VESTIBULAR TREATMENT:                                                                                                   N/a eval   PATIENT EDUCATION: Education details: PT POC, exam findings, BP parameters Person educated: Patient Education method: Explanation Education comprehension: verbalized understanding  HOME EXERCISE PROGRAM: To be provided GOALS: Goals reviewed with patient? Yes  SHORT TERM GOALS: = LTG based on POC length  LONG TERM  GOALS: Target date: 06/29/22  Pt will be independent with final HEP for improved balance  Baseline: to be provided Goal status: INITIAL  2.  MSQ goal  Baseline: to be assessed Goal status: INITIAL  3.  DVA goal  Baseline: to be assessed Goal status: INITIAL  4.  M-CTSIB goal  Baseline: to be assessed Goal status: INITIAL  5.  Patient will improve FOTO score to >/= 60 to demonstrate improved function Baseline: 50 Goal status: INITIAL   ASSESSMENT:  CLINICAL IMPRESSION: Patient is a 76 y.o. male who was seen today for physical therapy evaluation and treatment for dizziness. He does have a significant result for sit > stand orthostatic assessment, likely contributing to his experience of dizziness. He also have a B (+) HIT, but (-) test of skew. Given significant decline in BP, unable to truly assess balance safely. He would benefit from a trial of PT to address the above mentioned deficits.   OBJECTIVE IMPAIRMENTS: cardiopulmonary status limiting activity, decreased activity tolerance, decreased balance, decreased endurance, decreased knowledge of condition, and dizziness.   ACTIVITY LIMITATIONS: sitting, standing, bed mobility, locomotion level, and caring for others  PARTICIPATION LIMITATIONS: meal prep, cleaning, interpersonal relationship, shopping, and community activity  PERSONAL FACTORS: Age, Fitness, Past/current experiences, Time since onset of injury/illness/exacerbation, and 3+ comorbidities: prior CVA, CHF, previous bouts of vertigo  are also affecting patient's functional outcome.   REHAB POTENTIAL: Fair time since onset  CLINICAL DECISION MAKING: Evolving/moderate complexity  EVALUATION COMPLEXITY: Moderate   PLAN:  PT FREQUENCY: 1x/week  PT DURATION: 4 weeks  PLANNED INTERVENTIONS: Therapeutic exercises, Therapeutic activity, Neuromuscular re-education, Balance training, Gait training, Patient/Family education, Self Care, Joint mobilization, Stair  training, Vestibular training, Canalith repositioning, Visual/preceptual remediation/compensation, DME instructions, Aquatic Therapy, Manual therapy, and Re-evaluation  PLAN FOR NEXT SESSION: HEP, MSQ, DVA, M-CTSIB, CHECK BP   Debbora Dus, PT Debbora Dus, PT, DPT, CBIS  06/01/2022, 11:17 AM

## 2022-06-02 ENCOUNTER — Encounter: Payer: PPO | Admitting: Physical Therapy

## 2022-06-03 NOTE — Progress Notes (Signed)
EPIC Encounter for ICM Monitoring  Patient Name: Joshua Moran. is a 76 y.o. male Date: 06/03/2022 Primary Care Physican: Lawerance Cruel, MD Primary Cardiologist: Aundra Dubin Electrophysiologist: Quentin Ore 01/12/2022 Office Weight: 285 lbs  04/15/2022 Office Weight: 283 lbs                                                            Spoke with patient and heart failure questions reviewed.  Transmission results reviewed.  Pt had slight swelling in hands during decreased impedance during holiday which resolved after taking PRN Furosemide.    CorVue thoracic impedance suggesting normal fluid levels with exception of possible fluid accumulation 12/9-12/15 and 12/24-12/27.    Prescribed:  Furosemide 20 mg take 1 tablet(s) (20 mg total) by mouth as directed (as needed).   Recommendations:  No changes and encouraged to call if experiencing any fluid symptoms.   Follow-up plan: ICM clinic phone appointment on 07/04/2022.   91 day device clinic remote transmission 06/13/2022.     EP/Cardiology Office Visits:  08/08/2022 with HF clinic.  Recall 03/24/2023 with Dr Quentin Ore.    Copy of ICM check sent to Dr. Quentin Ore.    3 month ICM trend: 06/02/2022.    12-14 Month ICM trend:     Rosalene Billings, RN 06/03/2022 12:29 PM

## 2022-06-08 ENCOUNTER — Encounter: Payer: Self-pay | Admitting: Physical Therapy

## 2022-06-08 ENCOUNTER — Telehealth: Payer: Self-pay | Admitting: Physical Therapy

## 2022-06-08 ENCOUNTER — Ambulatory Visit: Payer: PPO | Attending: Otolaryngology | Admitting: Physical Therapy

## 2022-06-08 VITALS — BP 67/41 | HR 70

## 2022-06-08 DIAGNOSIS — R2681 Unsteadiness on feet: Secondary | ICD-10-CM | POA: Insufficient documentation

## 2022-06-08 DIAGNOSIS — R42 Dizziness and giddiness: Secondary | ICD-10-CM

## 2022-06-08 MED ORDER — EPLERENONE 25 MG PO TABS: 25.0000 mg | ORAL_TABLET | Freq: Every day | ORAL | 2 refills | Status: DC

## 2022-06-08 NOTE — Telephone Encounter (Signed)
Decrease eplerenone to 25 mg daily and take qhs.

## 2022-06-08 NOTE — Telephone Encounter (Signed)
Dr. Aundra Dubin,  Your patient Joshua Moran.  Has been referred to [facility] for PT for his dizziness. On eval on 12/27, his BP in seated was 80/47 and in standing was 65/37. Today in seated it was 91/58 and in standing it was 67/41. Pt only reporting slight lightheadedness with standing that subsides quickly.   I just wanted to make you aware as his low BP in standing makes it unsafe for [place] to do any standing activity with him during the session. And I'm wondering how much of his low BPs is also contributing to his dizziness. I understand he has a complex cardiology hx. Just wanted to make you aware and if you had any BP parameters for PT from a cardiology standpoint.  Thank you, Janann August, PT, DPT 06/08/22 1:03 PM    [facility] [address] [place], [place]  [address] Phone:  [phone] Fax:  [phone]

## 2022-06-08 NOTE — Patient Instructions (Signed)
Gaze Stabilization: Sitting    Keeping eyes on target on wall a few feet away, tilt head down 15-30 and move head side to side for _60_ seconds. Repeat while moving head up and down for __60__ seconds. Perform 2-3 sets of each.  Do _1-2___ sessions per day.   Copyright  VHI. All rights reserved.

## 2022-06-08 NOTE — Telephone Encounter (Signed)
Patient advised and verbalized understanding. Med list updated to reflect changes.  ? ?

## 2022-06-08 NOTE — Therapy (Signed)
OUTPATIENT PHYSICAL THERAPY VESTIBULAR TREATMENT     Patient Name: Joshua Moran. MRN: [id number] DOB:August 05, 1945, 77 y.o., male Today's Date: 06/08/2022  END OF SESSION:  PT End of Session - 06/08/22 1106     Visit Number 2    Number of Visits 5    Date for PT Re-Evaluation 07/13/22    Authorization Type [employer]    PT Start Time 1105    PT Stop Time 1140   full time not used due to low BP   PT Time Calculation (min) 35 min    Activity Tolerance Patient tolerated treatment well   limited by low BP   Behavior During Therapy WFL for tasks assessed/performed             Past Medical History:  Diagnosis Date   Abnormal screening CT of chest    coronary calcium in left main and RCA   Anemia    Arthritis    CHF (congestive heart failure) (HCC)    Complication of anesthesia    Pt reports difficult urinating   Coronary artery calcification seen on CAT scan    no ischemic on nuclear stress test 09/2019   DCM (dilated cardiomyopathy) ([place])    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  Cardiac MRI 2021 with EF 39% and moderate RV dysfunction and non-compaction   Dyspnea    with some activity   GERD (gastroesophageal reflux disease)    occasional   Glaucoma    Hyperlipidemia    statin intolerant at doses > Crestor 9m 3 times weekly   OSA on CPAP    PAF (paroxysmal atrial fibrillation) ([place]    CHADS2VASC score 2 on apixaban   Past Surgical History:  Procedure Laterality Date   AV NODE ABLATION N/A 12/11/2020   Procedure: AV NODE ABLATION;  Surgeon: LVickie Epley MD;  [facility];  Service: Cardiovascular;  Laterality: N/A;   BIV PACEMAKER INSERTION CRT-P N/A 12/11/2020   Procedure: BIV PACEMAKER INSERTION CRT-P;  Surgeon: LVickie Epley MD;  [facility];  Service: Cardiovascular;  Laterality: N/A;   CARDIOVERSION N/A 04/01/2020   Procedure: CARDIOVERSION;  Surgeon: RSkeet Latch MD;  [place] [facility] [place]  Service:  Cardiovascular;  Laterality: N/A;   CARDIOVERSION N/A 09/08/2020   Procedure: CARDIOVERSION;  Surgeon: HPixie Casino MD;  [facility];  Service: Cardiovascular;  Laterality: N/A;   EYE SURGERY     bilateral cataracts   KNEE ARTHROSCOPY W/ DEBRIDEMENT     left    RETINAL DETACHMENT SURGERY Bilateral    RIGHT/LEFT HEART CATH AND CORONARY ANGIOGRAPHY N/A 05/06/2020   Procedure: RIGHT/LEFT HEART CATH AND CORONARY ANGIOGRAPHY;  Surgeon: MLarey Dresser MD;  [facility];  Service: Cardiovascular;  Laterality: N/A;   TOTAL KNEE ARTHROPLASTY Left 01/01/2018   Procedure: LEFT TOTAL KNEE ARTHROPLASTY;  Surgeon: AGaynelle Arabian MD;  [facility];  Service: Orthopedics;  Laterality: Left;  50 mins   TOTAL KNEE ARTHROPLASTY Right 06/25/2018   Procedure: TOTAL KNEE ARTHROPLASTY;  Surgeon: AGaynelle Arabian MD;  [facility];  Service: Orthopedics;  Laterality: Right;  568m   VASECTOMY     Patient Active Problem List   Diagnosis Date Noted   Atrial fibrillation ([facility]07/01/2021   Persistent atrial fibrillation (HCC)    DCM (dilated cardiomyopathy) ([facility]   Coronary artery calcification seen on CAT scan    OSA on CPAP    Acute ischemic stroke ([facility]03/07/2019  Obesity 07/24/2018   Paroxysmal atrial fibrillation ([place]) 04/24/2018   Chest pain 04/24/2018   OA (osteoarthritis) of knee 01/01/2018   Dyspnea 09/19/2017   Primary open angle glaucoma of right eye, indeterminate stage 06/10/2015   Dyslipidemia, goal LDL below 100 05/05/2015   Abnormal CT scan of heart 05/05/2015   Recent retinal detachment, partial, with giant tear, bilateral 11/24/2011   Status post intraocular lens implant 11/24/2011    PCP: Lawerance Cruel, MD REFERRING PROVIDER: Raylene Miyamoto, MD  REFERRING DIAG: R42 (ICD-10-CM) - Dizziness  THERAPY DIAG:  Dizziness and giddiness  Unsteadiness on feet  ONSET DATE: 05/25/2022 referral  Rationale for Evaluation and Treatment:  Rehabilitation  SUBJECTIVE:   SUBJECTIVE STATEMENT: No changes since he was last here. Bending over and turning really quickly is challenging for him for his balance. Also standing on one leg.   Pt accompanied by: self  PERTINENT HISTORY: L frontal lobe subcortical white matter infarct , longstanding h/o vertigo, combined systolic/diastolic HF,   PAIN:  Are you having pain? No  PRECAUTIONS: Fall, ICD/Pacemaker, and Other: CHECK BLOOD PRESSURE  Vitals:   06/08/22 1110 06/08/22 1114  BP: (!) 91/58 (!) 67/41  Pulse: 67 70   Sitting and Standing   WEIGHT BEARING RESTRICTIONS: No  FALLS: Has patient fallen in last 6 months? No  LIVING ENVIRONMENT: Lives with: lives with their family Lives in: House/apartment Stairs: Yes: External: "a few" steps; none Has following equipment at home: Single point cane, Walker - 2 wheeled, Crutches, and Grab bars  PLOF: Independent still driving   PATIENT GOALS: "to improve my balance"   OBJECTIVE:   DIAGNOSTIC FINDINGS:  CTA head & neck no LVO, mild atherosclerosis head and neck. 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 Sinus dz 2D Echo ejection fraction 45 to 50%.  Mild left ventricular hypertrophy.  No cardiac source of embolism.      VESTIBULAR TREATMENT:   Therapeutic Activity:  OTHOSTATICS: seated: 91/58; standing: 67/41 (wearing knee-high compression socks)   Had discussion with pt regarding low/orthostatic BP values in sitting and in standing (similar to when pt was here for eval). Pt asymptomatic in sitting but did report mild lightheadedness in standing that subsided quickly. Pt reporting that he does not drink a lot of water during the day and mainly has decaf coffee/tea. Educated on importance of staying well hydrated and drinking significantly more water during the day due to orthostatics. Also educated on taking time  with transitional movements and doing ankle pumps/seated marching before standing. Discussed that his BP is too low to safely perform any standing activity during therapy at this time. PT to reach out to pt's cardiologist regarding his last couple of BP values and ask about any BP parameters. Educated that pt's BP can also be contributing to his dizziness.   POSITIONAL TESTING: Right Roll Test: no nystagmus Left Roll Test: no nystagmus Right Sidelying: no nystagmus and pt reporting some lightheadedness when coming up  Left Sidelying: no nystagmus and pt reporting some lightheadedness when coming up   MOTION SENSITIVITY:    Motion Sensitivity Quotient  Intensity: 0 = none, 1 = Lightheaded, 2 = Mild, 3 = Moderate, 4 = Severe, 5 = Vomiting  Intensity  1. Sitting to supine 0  2. Supine to L side 0  3. Supine to R side 0  4. Supine to sitting 0  5. L  Hallpike-Dix   6. Up from L    7. R Hallpike-Dix   8. Up from R    9. Sitting, head  tipped to L knee 0  10. Head up from L  knee 0  11. Sitting, head  tipped to R knee 0  12. Head up from R  knee 0  13. Sitting head turns x5 0  14.Sitting head nods x5 0  15. In stance, 180  turn to L    16. In stance, 180  turn to R                                                                                                      Gaze Adaptation: x1 Viewing Horizontal: Position: Seated , Time: 30 seconds, 60 seconds, Reps: 3, and Comment: Very mild dizizness and x1 Viewing Vertical:  Position: Seated, Time: 60, Reps: 2, and Comment: No dizziness      PATIENT EDUCATION: Education details: See above, seated VOR exercise for HEP  Person educated: Patient Education method: Explanation, Demonstration, and Handouts Education comprehension: verbalized understanding and returned demonstration  HOME EXERCISE PROGRAM: Seated VOR x60 seconds  GOALS: Goals reviewed with patient? Yes  SHORT TERM GOALS: = LTG based on POC length  LONG TERM  GOALS: Target date: 06/29/22  Pt will be independent with final HEP for improved balance  Baseline: to be provided Goal status: INITIAL  2.  MSQ goal  Baseline: Goal not needed.  Goal status: MET  3.  DVA goal  Baseline: to be assessed Goal status: INITIAL  4.  M-CTSIB goal  Baseline: to be assessed Goal status: INITIAL  5.  Patient will improve FOTO score to >/= 60 to demonstrate improved function Baseline: 50 Goal status: INITIAL   ASSESSMENT:  CLINICAL IMPRESSION: Pt arrived to therapy with low BP orthostatics from sitting > standing (see above for further details). Due to low BP in standing, unsafe to participate in standing balance tasks safely during therapy. Educated on making sure pt is drinking enough water and PT to reach out to pt's cardiologist regarding pt's recent BP values and any BP parameters. Only performed seated tasks today. Pt scoring a 0 on all MSQ items with goal not needed. Initiated seated VOR exercises with pt only with mild dizziness in the horizontal direction. Will continue to progress towards LTGs when BP is stable.    OBJECTIVE IMPAIRMENTS: cardiopulmonary status limiting activity, decreased activity tolerance, decreased balance, decreased endurance, decreased knowledge of condition, and dizziness.   ACTIVITY LIMITATIONS: sitting, standing, bed mobility, locomotion level, and caring for others  PARTICIPATION LIMITATIONS: meal prep, cleaning, interpersonal relationship, shopping, and community activity  PERSONAL FACTORS: Age, Fitness, Past/current experiences, Time since onset of injury/illness/exacerbation, and 3+ comorbidities: prior CVA, CHF, previous bouts of vertigo  are also affecting patient's functional outcome.   REHAB POTENTIAL: Fair time since onset  CLINICAL DECISION MAKING: Evolving/moderate complexity  EVALUATION COMPLEXITY: Moderate   PLAN:  PT FREQUENCY: 1x/week  PT DURATION: 4 weeks  PLANNED INTERVENTIONS: Therapeutic  exercises, Therapeutic activity, Neuromuscular re-education, Balance training, Gait training,  Patient/Family education, Self Care, Joint mobilization, Stair training, Vestibular training, Canalith repositioning, Visual/preceptual remediation/compensation, DME instructions, Aquatic Therapy, Manual therapy, and Re-evaluation  PLAN FOR NEXT SESSION: HEP, MSQ, DVA, M-CTSIB, CHECK BP   Savaya Hakes, PT, DPT 06/08/22 11:57 AM

## 2022-06-13 ENCOUNTER — Ambulatory Visit (INDEPENDENT_AMBULATORY_CARE_PROVIDER_SITE_OTHER): Payer: PPO

## 2022-06-13 DIAGNOSIS — I447 Left bundle-branch block, unspecified: Secondary | ICD-10-CM | POA: Diagnosis not present

## 2022-06-14 LAB — CUP PACEART REMOTE DEVICE CHECK
Battery Remaining Longevity: 83 mo
Battery Remaining Percentage: 84 %
Battery Voltage: 3.02 V
Date Time Interrogation Session: 20240108020010
Implantable Lead Connection Status: 753985
Implantable Lead Connection Status: 753985
Implantable Lead Implant Date: 20220708
Implantable Lead Implant Date: 20220708
Implantable Lead Location: 753858
Implantable Lead Location: 753860
Implantable Pulse Generator Implant Date: 20220708
Lead Channel Impedance Value: 1100 Ohm
Lead Channel Impedance Value: 580 Ohm
Lead Channel Pacing Threshold Amplitude: 0.5 V
Lead Channel Pacing Threshold Amplitude: 0.75 V
Lead Channel Pacing Threshold Pulse Width: 0.5 ms
Lead Channel Pacing Threshold Pulse Width: 0.5 ms
Lead Channel Sensing Intrinsic Amplitude: 10.6 mV
Lead Channel Setting Pacing Amplitude: 2.5 V
Lead Channel Setting Pacing Amplitude: 2.5 V
Lead Channel Setting Pacing Pulse Width: 0.5 ms
Lead Channel Setting Pacing Pulse Width: 0.5 ms
Lead Channel Setting Sensing Sensitivity: 4 mV
Pulse Gen Model: 3562
Pulse Gen Serial Number: 3913632

## 2022-06-15 ENCOUNTER — Telehealth (HOSPITAL_COMMUNITY): Payer: Self-pay | Admitting: *Deleted

## 2022-06-15 ENCOUNTER — Ambulatory Visit: Payer: PPO | Admitting: Physical Therapy

## 2022-06-15 ENCOUNTER — Encounter: Payer: Self-pay | Admitting: Physical Therapy

## 2022-06-15 DIAGNOSIS — R2681 Unsteadiness on feet: Secondary | ICD-10-CM

## 2022-06-15 DIAGNOSIS — R42 Dizziness and giddiness: Secondary | ICD-10-CM

## 2022-06-15 NOTE — Telephone Encounter (Signed)
Received vm from Chloe PT with outpt neuro rehab. She called to report pts bp 98/64 sitting and 66/44 standing. Pt c/o dizziness. Pt is compliant with meds and wears knee high compression socks.   Routed to [place] for advice

## 2022-06-15 NOTE — Telephone Encounter (Signed)
Pt said he takes his eplerenone at night

## 2022-06-15 NOTE — Telephone Encounter (Signed)
Decrease eplerenone to 25 daily, take qhs.

## 2022-06-15 NOTE — Therapy (Signed)
OUTPATIENT PHYSICAL THERAPY VESTIBULAR TREATMENT - ARRIVED NO CHARGE     Patient Name: Joshua Moran. MRN: [id number] DOB:April 05, 1946, 77 y.o., male Today's Date: 06/15/2022  END OF SESSION:  PT End of Session - 06/15/22 1017     Visit Number 2    Number of Visits 5    Date for PT Re-Evaluation 07/13/22    Authorization Type [employer]    PT Start Time 1016    PT Stop Time 1038   full time not used due to low BP   PT Time Calculation (min) 22 min    Activity Tolerance Patient tolerated treatment well   limited by low BP   Behavior During Therapy WFL for tasks assessed/performed             Past Medical History:  Diagnosis Date   Abnormal screening CT of chest    coronary calcium in left main and RCA   Anemia    Arthritis    CHF (congestive heart failure) (HCC)    Complication of anesthesia    Pt reports difficult urinating   Coronary artery calcification seen on CAT scan    no ischemic on nuclear stress test 09/2019   DCM (dilated cardiomyopathy) ([place])    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  Cardiac MRI 2021 with EF 39% and moderate RV dysfunction and non-compaction   Dyspnea    with some activity   GERD (gastroesophageal reflux disease)    occasional   Glaucoma    Hyperlipidemia    statin intolerant at doses > Crestor '5mg'$  3 times weekly   OSA on CPAP    PAF (paroxysmal atrial fibrillation) ([place])    CHADS2VASC score 2 on apixaban   Past Surgical History:  Procedure Laterality Date   AV NODE ABLATION N/A 12/11/2020   Procedure: AV NODE ABLATION;  Surgeon: Vickie Epley, MD;  [facility];  Service: Cardiovascular;  Laterality: N/A;   BIV PACEMAKER INSERTION CRT-P N/A 12/11/2020   Procedure: BIV PACEMAKER INSERTION CRT-P;  Surgeon: Vickie Epley, MD;  Location: [facility];  Service: Cardiovascular;  Laterality: N/A;   CARDIOVERSION N/A 04/01/2020   Procedure: CARDIOVERSION;  Surgeon: Skeet Latch, MD;  Location: [place];  Service: Cardiovascular;  Laterality: N/A;   CARDIOVERSION N/A 09/08/2020   Procedure: CARDIOVERSION;  Surgeon: Pixie Casino, MD;  Location: [facility];  Service: Cardiovascular;  Laterality: N/A;   EYE SURGERY     bilateral cataracts   KNEE ARTHROSCOPY W/ DEBRIDEMENT     left    RETINAL DETACHMENT SURGERY Bilateral    RIGHT/LEFT HEART CATH AND CORONARY ANGIOGRAPHY N/A 05/06/2020   Procedure: RIGHT/LEFT HEART CATH AND CORONARY ANGIOGRAPHY;  Surgeon: Larey Dresser, MD;  Location: [facility];  Service: Cardiovascular;  Laterality: N/A;   TOTAL KNEE ARTHROPLASTY Left 01/01/2018   Procedure: LEFT TOTAL KNEE ARTHROPLASTY;  Surgeon: Gaynelle Arabian, MD;  [facility];  Service: Orthopedics;  Laterality: Left;  50 mins   TOTAL KNEE ARTHROPLASTY Right 06/25/2018   Procedure: TOTAL KNEE ARTHROPLASTY;  Surgeon: Gaynelle Arabian, MD;  [facility];  Service: Orthopedics;  Laterality: Right;  4mn   VASECTOMY     Patient Active Problem List   Diagnosis Date Noted   Atrial fibrillation ([place] 12/11/2020   Persistent atrial fibrillation (HCC)    DCM (dilated cardiomyopathy) (HCC)    Coronary artery calcification seen on CAT scan    OSA on CPAP    Acute ischemic  stroke ([place]) 08/06/2019   Obesity 07/24/2018   Paroxysmal atrial fibrillation ([place]) 04/24/2018   Chest pain 04/24/2018   OA (osteoarthritis) of knee 01/01/2018   Dyspnea 09/19/2017   Primary open angle glaucoma of right eye, indeterminate stage 06/10/2015   Dyslipidemia, goal LDL below 100 05/05/2015   Abnormal CT scan of heart 05/05/2015   Recent retinal detachment, partial, with giant tear, bilateral 11/24/2011   Status post intraocular lens implant 11/24/2011    PCP: Lawerance Cruel, MD REFERRING PROVIDER: Raylene Miyamoto, MD  REFERRING DIAG: R42 (ICD-10-CM) - Dizziness  THERAPY DIAG:  Dizziness and giddiness  Unsteadiness on feet  ONSET DATE: 05/25/2022 referral  Rationale for Evaluation and  Treatment: Rehabilitation  SUBJECTIVE:   SUBJECTIVE STATEMENT: Has been trying to drink more water. Had an episode of dizziness this morning, reports that is comes and goes. Cardiologist reached out to pt last week and instructed him to decrease eplerenone to 25 mg daily and to take 1x a day.   Pt accompanied by: self  PERTINENT HISTORY: L frontal lobe subcortical white matter infarct , longstanding h/o vertigo, combined systolic/diastolic HF,   PAIN:  Are you having pain? No  PRECAUTIONS: Fall, ICD/Pacemaker, and Other: CHECK BLOOD PRESSURE  There were no vitals filed for this visit.  Sitting and Standing   WEIGHT BEARING RESTRICTIONS: No  FALLS: Has patient fallen in last 6 months? No  LIVING ENVIRONMENT: Lives with: lives with their family Lives in: House/apartment Stairs: Yes: External: "a few" steps; none Has following equipment at home: Single point cane, Walker - 2 wheeled, Crutches, and Grab bars  PLOF: Independent still driving   PATIENT GOALS: "to improve my balance"   OBJECTIVE:   DIAGNOSTIC FINDINGS:  CTA head & neck no LVO, mild atherosclerosis head and neck. 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 Sinus dz 2D Echo ejection fraction 45 to 50%.  Mild left ventricular hypertrophy.  No cardiac source of embolism.      VESTIBULAR TREATMENT:   OTHOSTATICS:  Performed with automatic cuff:  Seated: 92/58 - pt reporting feeling a little lightheaded  standing: 68/39 - pt reporting feeling lightheadedness initially in standing  (wearing knee-high compression socks)   Performed manually:  Seated: 98/64 Standing: 66/44    Educated on continued low BP values even after medication changes and pt trying to improve his water intake. Continued to educate on safety concerns with BP this low with PT and how this can also be contributing to pt feeling dizzy.  While pt still present, PT called pt's cardiologist's office and had to leave a message with the nurse regarding pt's BP during therapy today and that pt will need to follow up with cardiology due to BP still being low. Discussed with pt that today will be arrived no charge due to low BP values and still unable to safely participate in therapy. PT will plan to follow up with pt later this week or early next week and see what cardiologist says. Pt in agreement with plan.     PATIENT EDUCATION: Education details: See above Person educated: Patient Education method: Explanation Education comprehension: verbalized understanding  HOME EXERCISE PROGRAM: Seated VOR x60 seconds  GOALS: Goals reviewed with patient? Yes  SHORT TERM GOALS: = LTG based on POC length  LONG TERM GOALS: Target date: 06/29/22  Pt will be independent with final HEP for improved balance  Baseline: to be provided Goal status: INITIAL  2.  MSQ goal  Baseline: Goal not needed.  Goal status: MET  3.  DVA goal  Baseline: to be assessed Goal status: INITIAL  4.  M-CTSIB goal  Baseline: to be assessed Goal status: INITIAL  5.  Patient will improve FOTO score to >/= 60 to demonstrate improved function Baseline: 50 Goal status: INITIAL   ASSESSMENT:  CLINICAL IMPRESSION: Arrived no charge - see above.   OBJECTIVE IMPAIRMENTS: cardiopulmonary status limiting activity, decreased activity tolerance, decreased balance, decreased endurance, decreased knowledge of condition, and dizziness.   ACTIVITY LIMITATIONS: sitting, standing, bed mobility, locomotion level, and caring for others  PARTICIPATION LIMITATIONS: meal prep, cleaning, interpersonal relationship, shopping, and community activity  PERSONAL FACTORS: Age, Fitness, Past/current experiences, Time since onset of injury/illness/exacerbation, and 3+ comorbidities: prior CVA, CHF, previous bouts of vertigo  are also affecting patient's functional outcome.    REHAB POTENTIAL: Fair time since onset  CLINICAL DECISION MAKING: Evolving/moderate complexity  EVALUATION COMPLEXITY: Moderate   PLAN:  PT FREQUENCY: 1x/week  PT DURATION: 4 weeks  PLANNED INTERVENTIONS: Therapeutic exercises, Therapeutic activity, Neuromuscular re-education, Balance training, Gait training, Patient/Family education, Self Care, Joint mobilization, Stair training, Vestibular training, Canalith repositioning, Visual/preceptual remediation/compensation, DME instructions, Aquatic Therapy, Manual therapy, and Re-evaluation  PLAN FOR NEXT SESSION: HEP, MSQ, DVA, M-CTSIB, CHECK BP   Kamarii Carton, PT, DPT 06/15/22 10:44 AM

## 2022-06-17 ENCOUNTER — Telehealth: Payer: Self-pay | Admitting: Physical Therapy

## 2022-06-17 NOTE — Telephone Encounter (Signed)
Physical therapist left vm reporting pts bp is still very low 92/58 sitting and 68/39 standing. Bp is too low to participate in therapy.   Call back #[phone]   Routed to [place]

## 2022-06-17 NOTE — Telephone Encounter (Signed)
But also decrease it.  He was taking 50, decrease to 25 mg.

## 2022-06-17 NOTE — Telephone Encounter (Signed)
Called pt and made him aware that PT reached out to his cardiologist's office and have not heard back regarding pt's low BP. Pt also reports that no other changes have been made to his medications. Discussed with pt will put PT on hold until he has more follow-up with his cardiologist (advised to call and make an in person appt) as pt's BP is too low to safely participate in PT and could also be contributing to his dizziness. Pt in agreement with plan and pt to plan to call back to be re-scheduled after further follow-up with cardiologist.   Janann August, PT, DPT 06/17/22 10:38 AM   [facility] [address] [place], [place]  [address] Phone:  [phone] Fax:  [phone]

## 2022-06-20 MED ORDER — EPLERENONE 25 MG PO TABS: 12.5000 mg | ORAL_TABLET | Freq: Every day | ORAL | 2 refills | Status: DC

## 2022-06-20 NOTE — Telephone Encounter (Signed)
Pt aware and new script sent.

## 2022-06-20 NOTE — Telephone Encounter (Signed)
Left vm for return call

## 2022-06-20 NOTE — Telephone Encounter (Signed)
Pt said he is taking '25mg'$  daily and his bp is still dropping too low. Pt said he decreased the medication a week ago.

## 2022-06-20 NOTE — Telephone Encounter (Signed)
Is he able to cut down to 12.5 mg with the current tablet? If not, give eplerenone 25 mg tab and have him cut in half and take half dose before bed.

## 2022-06-22 ENCOUNTER — Ambulatory Visit: Payer: PPO

## 2022-06-29 ENCOUNTER — Encounter: Payer: PPO | Admitting: Physical Therapy

## 2022-06-29 DIAGNOSIS — E7800 Pure hypercholesterolemia, unspecified: Secondary | ICD-10-CM | POA: Diagnosis not present

## 2022-06-29 DIAGNOSIS — I4891 Unspecified atrial fibrillation: Secondary | ICD-10-CM | POA: Diagnosis not present

## 2022-06-29 DIAGNOSIS — N401 Enlarged prostate with lower urinary tract symptoms: Secondary | ICD-10-CM | POA: Diagnosis not present

## 2022-06-29 DIAGNOSIS — I5022 Chronic systolic (congestive) heart failure: Secondary | ICD-10-CM | POA: Diagnosis not present

## 2022-07-04 ENCOUNTER — Ambulatory Visit: Payer: PPO | Attending: Cardiology

## 2022-07-04 DIAGNOSIS — Z950 Presence of cardiac pacemaker: Secondary | ICD-10-CM

## 2022-07-04 DIAGNOSIS — I5022 Chronic systolic (congestive) heart failure: Secondary | ICD-10-CM | POA: Diagnosis not present

## 2022-07-06 NOTE — Progress Notes (Signed)
EPIC Encounter for ICM Monitoring  Patient Name: Joshua Moran. is a 77 y.o. male Date: 07/06/2022 Primary Care Physican: Lawerance Cruel, MD Primary Cardiologist: Aundra Dubin Electrophysiologist: Quentin Ore 01/12/2022 Office Weight: 285 lbs  04/15/2022 Office Weight: 283 lbs 07/06/2022 Weight: 276 lbs.                                                             Spoke with patient and heart failure questions reviewed.  Transmission results reviewed.  Pt asymptomatic for fluid accumulation.  Reports feeling well at this time and voices no complaints.     CorVue thoracic impedance suggesting normal fluid levels...Marland KitchenMarland Kitchen    Prescribed:  Furosemide 20 mg take 1 tablet(s) (20 mg total) by mouth as directed (as needed).   Recommendations:  No changes and encouraged to call if experiencing any fluid symptoms.   Follow-up plan: ICM clinic phone appointment on 08/08/2022.   91 day device clinic remote transmission 09/12/2022.     EP/Cardiology Office Visits:  08/08/2022 with HF clinic.  Recall 03/24/2023 with Dr Quentin Ore.    Copy of ICM check sent to Dr. Quentin Ore.    3 month ICM trend: 07/04/2022.    12-14 Month ICM trend:     Rosalene Billings, RN 07/06/2022 2:28 PM

## 2022-07-06 NOTE — Progress Notes (Unsigned)
SLEEP MEDICINE VIRTUAL VISIT via Video Note   Because of Joshua BECRAFT Jr.'s co-morbid illnesses, he is at least at moderate risk for complications without adequate follow up.  This format is felt to be most appropriate for this patient at this time.  All issues noted in this document were discussed and addressed.  A limited physical exam was performed with this format.  Please refer to the patient's chart for his consent to telehealth for [facility].   Date:  07/07/2022   ID:  Joshua Moran., DOB [birth year], MRN [id number] The patient was identified using 2 identifiers.  Patient Location: Home Provider Location: Home Office   PCP:  Lawerance Cruel, MD   [place] [facility] Cardiologist:  [name], MD Electrophysiologist:  Vickie Epley, MD     Evaluation Performed:  Follow-Up Visit  Chief Complaint:  OSA  History of Present Illness:    Joshua Moran. is a 77 y.o. male with a hx of coronary calcifications of the LM and RCA, PAF, HLD, GERD and retired Teacher, English as a foreign language for [employer].  He has never smoked but has been followed by Dr. Gwenlyn Found in the past for [place] felt related to poor exercise tolerance from inactivity.  Stress myoview in 2016 was normal and 2D echo in 2019 showed normal LVF with G1DD and mild pulmonary HTN.  He saw Kerin Ransom in 07/2018 and was noted to be in afib by EKG but not started on DOAC as CHADS2VASC score he felt was 1.  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  He is followed by EP for his permanent atrial fibrillation and CRT-P.  A sleep study was ordered which showed moderate OSA with an AHI of 26/hr and was started on auto CPAP from 4-20cm H2O.   He is doing well with his PAP device and thinks that he has gotten used to it.  He wears it 4-5 hours a night but if he wears it longer he gets little bumps where it pushes against his eye teeth.  He tried a larger mask but that leaked.  He is a mouth breather so cannot use a  nasal pillow mask. He feels the pressure is adequate.  He does not really feel rested in the am and rarely will take a nap during the day.  He has some mouth and nasal dryness at times.  He does not think that he snores.    Past Medical History:  Diagnosis Date   Abnormal screening CT of chest    coronary calcium in left main and RCA   Anemia    Arthritis    CHF (congestive heart failure) (HCC)    Complication of anesthesia    Pt reports difficult urinating   Coronary artery calcification seen on CAT scan    no ischemic on nuclear stress test 09/2019   DCM (dilated cardiomyopathy) ([place])    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  Cardiac MRI 2021 with EF 39% and moderate RV dysfunction and non-compaction   Dyspnea    with some activity   GERD (gastroesophageal reflux disease)    occasional   Glaucoma    Hyperlipidemia    statin intolerant at doses > Crestor '5mg'$  3 times weekly   OSA on CPAP    PAF (paroxysmal atrial fibrillation) (HCC)    CHADS2VASC score 2 on apixaban   Past Surgical History:  Procedure Laterality Date   AV NODE ABLATION  N/A 12/11/2020   Procedure: AV NODE ABLATION;  Surgeon: Vickie Epley, MD;  Location: [place] [facility];  Service: Cardiovascular;  Laterality: N/A;   BIV PACEMAKER INSERTION CRT-P N/A 12/11/2020   Procedure: BIV PACEMAKER INSERTION CRT-P;  Surgeon: Vickie Epley, MD;  Location: [place] [facility];  Service: Cardiovascular;  Laterality: N/A;   CARDIOVERSION N/A 04/01/2020   Procedure: CARDIOVERSION;  Surgeon: Skeet Latch, MD;  Location: [place];  Service: Cardiovascular;  Laterality: N/A;   CARDIOVERSION N/A 09/08/2020   Procedure: CARDIOVERSION;  Surgeon: Pixie Casino, MD;  Location: [facility];  Service: Cardiovascular;  Laterality: N/A;   EYE SURGERY     bilateral cataracts   KNEE ARTHROSCOPY W/ DEBRIDEMENT     left    RETINAL DETACHMENT SURGERY Bilateral    RIGHT/LEFT HEART CATH AND CORONARY ANGIOGRAPHY N/A 05/06/2020    Procedure: RIGHT/LEFT HEART CATH AND CORONARY ANGIOGRAPHY;  Surgeon: Larey Dresser, MD;  Location: [place] [facility];  Service: Cardiovascular;  Laterality: N/A;   TOTAL KNEE ARTHROPLASTY Left 01/01/2018   Procedure: LEFT TOTAL KNEE ARTHROPLASTY;  Surgeon: Gaynelle Arabian, MD;  Location: [facility];  Service: Orthopedics;  Laterality: Left;  50 mins   TOTAL KNEE ARTHROPLASTY Right 06/25/2018   Procedure: TOTAL KNEE ARTHROPLASTY;  Surgeon: Gaynelle Arabian, MD;  Location: [facility];  Service: Orthopedics;  Laterality: Right;  98mn   VASECTOMY       Current Meds  Medication Sig   acetaminophen (TYLENOL) 500 MG tablet Take 1,000 mg by mouth every 6 (six) hours as needed (PAIN).   apixaban (ELIQUIS) 5 MG TABS tablet Take 1 tablet (5 mg total) by mouth 2 (two) times daily.   cetirizine (ZYRTEC) 10 MG tablet Take 10 mg by mouth at bedtime.   Cholecalciferol (VITAMIN D3) 50 MCG (2000 UT) TABS Take 2,000 Units by mouth daily.   ezetimibe (ZETIA) 10 MG tablet Take 1 tablet (10 mg total) by mouth daily.   ferrous sulfate 325 (65 FE) MG tablet Take 325 mg by mouth in the morning.   fluticasone (FLONASE) 50 MCG/ACT nasal spray Place 2 sprays into both nostrils 2 (two) times daily.    furosemide (LASIX) 20 MG tablet Take 1 tablet (20 mg total) by mouth daily as needed for fluid or edema.   Glucosamine Sulfate 750 MG TABS Take 750 mg by mouth in the morning and at bedtime.   losartan (COZAAR) 25 MG tablet Take 0.5 tablets (12.5 mg total) by mouth at bedtime.   Melatonin 10 MG TABS Take 10 mg by mouth at bedtime.    Multiple Vitamin (MULTIVITAMIN WITH MINERALS) TABS tablet Take 1 tablet by mouth every evening.   rosuvastatin (CRESTOR) 5 MG tablet Take 5 mg by mouth every Monday, Wednesday, and Friday. IN THE EVENING   sildenafil (REVATIO) 20 MG tablet Take 40-100 mg by mouth daily as needed (erectile dysfunction.).    tamsulosin (FLOMAX) 0.4 MG CAPS capsule Take 0.4 mg by mouth at bedtime.    Vericiguat (VERQUVO)  2.5 MG TABS Take 2.5 mg by mouth daily.     Allergies:   Xarelto [rivaroxaban], Empagliflozin, Spironolactone, Penicillins, Statins, and Vancomycin   Social History   Tobacco Use   Smoking status: Never   Smokeless tobacco: Never  Vaping Use   Vaping Use: Never used  Substance Use Topics   Alcohol use: Yes    Alcohol/week: 1.0 standard drink of alcohol    Types: 1 Cans of beer per week  Comment: Rare   Drug use: No     Family Hx: The patient's family history includes Lung cancer in his mother.  ROS:   Please see the history of present illness.     All other systems reviewed and are negative.   Prior Sleep studies:   The following studies were reviewed today:  PAP  compliance download  Labs/Other Tests and Data Reviewed:     Recent Labs: 10/12/2021: B Natriuretic Peptide 91.8; Hemoglobin 13.6; Platelets 197 04/15/2022: BUN 16; Creatinine, Ser 1.00; Potassium 3.8; Sodium 140   Wt Readings from Last 3 Encounters:  07/07/22 280 lb (127 kg)  05/12/22 284 lb 12.8 oz (129.2 kg)  04/15/22 283 lb 6.4 oz (128.5 kg)     Risk Assessment/Calculations:    CHA2DS2-VASc Score = 4   This indicates a 4.8% annual risk of stroke. The patient's score is based upon: CHF History: 1 HTN History: 0 Diabetes History: 0 Stroke History: 0 Vascular Disease History: 1 Age Score: 2 Gender Score: 0     Objective:    Vital Signs:  BP (!) 144/87   Pulse 71   Ht '5\' 11"'$  (1.803 m)   Wt 280 lb (127 kg)   BMI 39.05 kg/m   Well nourished, well developed male in no acute distress. Well appearing, alert and conversant, regular work of breathing,  good skin color  Eyes- anicteric mouth- oral mucosa is pink  neuro- grossly intact skin- no apparent rash or lesions or cyanosis  ASSESSMENT & PLAN:    OSA - The patient is tolerating PAP therapy well without any problems. The PAP download performed by his DME was personally reviewed and interpreted by me today and showed an AHI of  1.5/hr on auto CPAP from 4 to 20 cm H2O with 70% compliance in using more than 4 hours nightly.  The patient has been using and benefiting from PAP use and will continue to benefit from therapy.  -continue to use flonase for his chronic nasal congestion -I encouraged him to use nasal saline spray 2 sprays each nostril twice daily -I encouraged him to try to increase the humidity on his device as well if needed -He runs out of H2O in his H2O chamber so I told him to place a humidifier in his bedroom    Total time of encounter: 15 minutes total time of encounter, including 15 minutes spent in face-to-face patient care on the date of this encounter. This time includes coordination of care and counseling regarding above mentioned problem list. Remainder of non-face-to-face time involved reviewing chart documents/testing relevant to the patient encounter and documentation in the medical record. I have independently reviewed documentation from referring provider.    Medication Adjustments/Labs and Tests Ordered: Current medicines are reviewed at length with the patient today.  Concerns regarding medicines are outlined above.   Tests Ordered: No orders of the defined types were placed in this encounter.   Medication Changes: No orders of the defined types were placed in this encounter.   Follow Up:  In Person in 1 year(s)  Signed, [name], MD  07/07/2022 8:32 AM    [place]

## 2022-07-07 ENCOUNTER — Encounter (HOSPITAL_COMMUNITY): Payer: Self-pay

## 2022-07-07 ENCOUNTER — Encounter: Payer: Self-pay | Admitting: Cardiology

## 2022-07-07 ENCOUNTER — Ambulatory Visit: Payer: PPO | Attending: Cardiology | Admitting: Cardiology

## 2022-07-07 VITALS — BP 144/87 | HR 71 | Ht 71.0 in | Wt 280.0 lb

## 2022-07-07 DIAGNOSIS — G4733 Obstructive sleep apnea (adult) (pediatric): Secondary | ICD-10-CM | POA: Diagnosis not present

## 2022-07-07 MED ORDER — SALINE SPRAY 0.65 % NA SOLN: 1.0000 | NASAL | 0 refills | Status: AC | PRN

## 2022-07-07 NOTE — Addendum Note (Signed)
Addended by: Joni Reining on: 07/07/2022 10:18 AM   Modules accepted: Orders

## 2022-07-07 NOTE — Patient Instructions (Addendum)
Medication Instructions:  Your physician has ordered nasal saline spray for you. Please use 2 sprays in each nostril twice a day.  *If you need a refill on your cardiac medications before your next appointment, please call your pharmacy*   Lab Work: None.  If you have labs (blood work) drawn today and your tests are completely normal, you will receive your results only by: [place] (if you have MyChart) OR A paper copy in the mail If you have any lab test that is abnormal or we need to change your treatment, we will call you to review the results.   Testing/Procedures: None.   Follow-Up: At [facility], you and your health needs are our priority.  As part of our continuing mission to provide you with exceptional heart care, we have created designated Provider Care Teams.  These Care Teams include your primary Cardiologist (physician) and Advanced Practice Providers (APPs -  Physician Assistants and Nurse Practitioners) who all work together to provide you with the care you need, when you need it.  We recommend signing up for the patient portal called "MyChart".  Sign up information is provided on this After Visit Summary.  MyChart is used to connect with patients for Virtual Visits (Telemedicine).  Patients are able to view lab/test results, encounter notes, upcoming appointments, etc.  Non-urgent messages can be sent to your provider as well.   To learn more about what you can do with MyChart, go to [email or web address].    Your next appointment:   1 year(s)  Provider:   [name], MD     Other Instructions Please increase the  humidity on your cpap device if needed for dry mouth/nose.  Place humidifier in your bedroom to help with  with the problem of running out of water in the water chamber.

## 2022-07-08 NOTE — Telephone Encounter (Signed)
Pt reports he has tried abdominal binder in the past with no change in symptoms. Ok to keep compression stockings  -reports he has mild dizziness (sometimes with minimal exertion) as soon as he wakes up - normally starts his day very slowly and things sort of  get better by 11 am. -notices quite a bit of dizziness when he bends over to fill dog bowl early in the AM -reports he can live with it but concerned with recurrent blood loss to brain when b/p drops so much so often, will this cause permanent damage

## 2022-07-13 DIAGNOSIS — H401113 Primary open-angle glaucoma, right eye, severe stage: Secondary | ICD-10-CM | POA: Diagnosis not present

## 2022-07-14 ENCOUNTER — Encounter (HOSPITAL_COMMUNITY): Payer: Self-pay | Admitting: *Deleted

## 2022-07-18 NOTE — Progress Notes (Signed)
Remote pacemaker transmission.   

## 2022-07-28 ENCOUNTER — Ambulatory Visit (HOSPITAL_COMMUNITY)
Admission: RE | Admit: 2022-07-28 | Discharge: 2022-07-28 | Disposition: A | Discharge status: Home / Self Care | Payer: PPO | Source: Ambulatory Visit | Attending: Cardiology | Admitting: Cardiology

## 2022-07-28 VITALS — BP 116/80 | HR 85 | Wt 283.4 lb

## 2022-07-28 DIAGNOSIS — R42 Dizziness and giddiness: Secondary | ICD-10-CM | POA: Diagnosis not present

## 2022-07-28 DIAGNOSIS — G4733 Obstructive sleep apnea (adult) (pediatric): Secondary | ICD-10-CM | POA: Insufficient documentation

## 2022-07-28 DIAGNOSIS — I4819 Other persistent atrial fibrillation: Secondary | ICD-10-CM | POA: Insufficient documentation

## 2022-07-28 DIAGNOSIS — I2510 Atherosclerotic heart disease of native coronary artery without angina pectoris: Secondary | ICD-10-CM | POA: Diagnosis not present

## 2022-07-28 DIAGNOSIS — Z7901 Long term (current) use of anticoagulants: Secondary | ICD-10-CM | POA: Diagnosis not present

## 2022-07-28 DIAGNOSIS — I429 Cardiomyopathy, unspecified: Secondary | ICD-10-CM | POA: Diagnosis not present

## 2022-07-28 DIAGNOSIS — I5022 Chronic systolic (congestive) heart failure: Secondary | ICD-10-CM

## 2022-07-28 DIAGNOSIS — I951 Orthostatic hypotension: Secondary | ICD-10-CM | POA: Diagnosis not present

## 2022-07-28 LAB — LIPID PANEL
Cholesterol: 138 mg/dL (ref 0–200)
HDL: 41 mg/dL (ref >40)
LDL Cholesterol: 86 mg/dL (ref 0–99)
Total CHOL/HDL Ratio: 3.4 RATIO
Triglycerides: 54 mg/dL (ref <150)
VLDL: 11 mg/dL (ref 0–40)

## 2022-07-28 LAB — CBC
HCT: 39.8 % (ref 39.0–52.0)
Hemoglobin: 13.3 g/dL (ref 13.0–17.0)
MCH: 31.2 pg (ref 26.0–34.0)
MCHC: 33.4 g/dL (ref 30.0–36.0)
MCV: 93.4 fL (ref 80.0–100.0)
Platelets: 214 10*3/uL (ref 150–400)
RBC: 4.26 MIL/uL (ref 4.22–5.81)
RDW: 13.5 % (ref 11.5–15.5)
WBC: 4.7 10*3/uL (ref 4.0–10.5)
nRBC: 0 % (ref 0.0–0.2)

## 2022-07-28 LAB — COMPREHENSIVE METABOLIC PANEL
ALT: 20 U/L (ref 0–44)
AST: 22 U/L (ref 15–41)
Albumin: 3.2 g/dL — ABNORMAL LOW (ref 3.5–5.0)
Alkaline Phosphatase: 58 U/L (ref 38–126)
Anion gap: 10 (ref 5–15)
BUN: 11 mg/dL (ref 8–23)
CO2: 24 mmol/L (ref 22–32)
Calcium: 8.6 mg/dL — ABNORMAL LOW (ref 8.9–10.3)
Chloride: 106 mmol/L (ref 98–111)
Creatinine, Ser: 0.93 mg/dL (ref 0.61–1.24)
GFR, Estimated: >60 mL/min (ref >60)
Glucose, Bld: 103 mg/dL — ABNORMAL HIGH (ref 70–99)
Potassium: 3.9 mmol/L (ref 3.5–5.1)
Sodium: 140 mmol/L (ref 135–145)
Total Bilirubin: 0.7 mg/dL (ref 0.3–1.2)
Total Protein: 6 g/dL — ABNORMAL LOW (ref 6.5–8.1)

## 2022-07-28 NOTE — Patient Instructions (Signed)
It was great to see you today! No medication changes are needed at this time.  Labs today We will only contact you if something comes back abnormal or we need to make some changes. Otherwise no news is good news!   Your physician wants you to follow-up in: 3 months with Dr Aundra Dubin. You will receive a reminder letter in the mail two months in advance. If you don't receive a letter, please call our office to schedule the follow-up appointment.   Do the following things EVERYDAY: Weigh yourself in the morning before breakfast. Write it down and keep it in a log. Take your medicines as prescribed Eat low salt foods--Limit salt (sodium) to 2000 mg per day.  Stay as active as you can everyday Limit all fluids for the day to less than 2 liters  At the [facility], you and your health needs are our priority. As part of our continuing mission to provide you with exceptional heart care, we have created designated Provider Care Teams. These Care Teams include your primary Cardiologist (physician) and Advanced Practice Providers (APPs- Physician Assistants and Nurse Practitioners) who all work together to provide you with the care you need, when you need it.   You may see any of the following providers on your designated Care Team at your next follow up: Dr Glori Bickers Dr Loralie Champagne Dr. Roxana Hires, NP Lyda Jester, [place] [facility] [place], [place] Forestine Na, NP Audry Riles, PharmD   Please be sure to bring in all your medications bottles to every appointment.    Thank you for choosing [facility]   If you have any questions or concerns before your next appointment please send [place] a message through [place] or call our office at [phone].    TO LEAVE A MESSAGE FOR THE NURSE SELECT OPTION 2, PLEASE LEAVE A MESSAGE INCLUDING: YOUR NAME DATE OF BIRTH CALL BACK NUMBER REASON FOR CALL**this is  important as we prioritize the call backs  YOU WILL RECEIVE A CALL BACK THE SAME DAY AS LONG AS YOU CALL BEFORE 4:00 PM

## 2022-07-28 NOTE — Progress Notes (Signed)
[facility] Progress Note    PCP: Lawerance Cruel, MD Cardiology: Dr. Radford Pax HF Cardiology: Dr. Aundra Dubin  77 y.o. with history of chronic systolic CHF and paroxysmal atrial fibrillation was referred by Dr. Radford Pax for evaluation of CHF.  Patient has been short of breath for years, worse over the last 6 months.  He has had an extensive workup.  Echo in 3/21 showed EF 45-50% with mildly decreased RV function.  Cardiolite in 4/21 showed fixed apical defect.  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  PYP scan in 8/21 was not suggestive of TTR cardiac amyloidosis.   He had been in persistent atrial fibrillation for about 2 years.  He saw Dr. Quentin Ore and was started on amiodarone.  DCCV was done in 10/21 back to NSR.  He was found to have M-spike on SPEP.  He saw Dr. Marin Olp who thought this was MGUS.   LHC/RHC in 12/21 showed occluded small OM2 and 80-90% stenosis mid PLOM (small caliber), no interventional target; normal filling pressures and preserved cardiac output.   He was unable to tolerate Jardiance due to extreme dizziness. He is off both Coreg and Toprol XL due to lightheadedness/hypotension.   He developed recurrent atrial fibrillation and failed DCCV in 4/22.  He has been in persistent atrial fibrillation, Dr Quentin Ore stopped his amiodarone due to futility.  In 7/22, he had AV nodal ablation with St Jude CRT-P device placement.   Echo in 5/22 showed EF 35-40%, mild LV enlargement, normal RV.  Echo in 5/23 showed EF 35-40%, diffuse hypokinesis. RV mildly reduced.   He has had issues recently tolerating GDMT due to low BP. Most recently eplerenone was decreased.   He presents today for f/u. Here w/ wife and reports doing fairly well. Stable since previous visit. No resting dyspnea. Able to get around to do most ADLs ok w/o significant limitation. NYHA Class II. C/w some positional  dizziness if stands too quickly but no syncope/ near syncope. Denies abnormal bleeding w/ Eliquis. Device interrogation today shows no Afib detection and good impedence. Not suggestive of fluid overload. Wt has been stable. Only uses Lasix PRN. Denies CP.    Labs (7/21): Urine immunofixation with monoclonal IgG protein, SPEP with M-spike.   Labs (10/21): TSH normal, K 4, creatinine 0.97, LFTs normal Labs (11/21): K 4.8, creatinine 1.07, hgb 14.7, LFTs normal, TSH normal Labs (12/21): LDL 66, HDL 45 Labs (1/22): K 3.9, creatinine 1.07 Labs (4/22): K 4.5, creatinine 1.14, TSH normal, hgb 13.8 Labs (7/22): K 4.5, creatinine 1.0 Labs (8/22): LDL 57 Labs (10/22): K 4.5, creatinine 1.07 Labs (3/23): K 3.8, creatinine 0.99 Labs (8/23): creatinine 0.77, LDL 65  St Jude device interrogation: 96% BiV paced, no AF, stable thoracic impedance.    PMH: 1. CVA: Occurred in 3/21, he was not on apixaban at the time.  2. GERD 3. OSA: Uses CPAP.  4. Hyperlipidemia 5. Atrial fibrillation: Paroxysmal.  - DCCV to NSR in 10/21.  - Failed DCCV in 4/22, stopped amiodarone.  - AV nodal ablation with St Jude CRT-P in 7/22.  6. MGUS 7. Chronic systolic CHF: Echo ([id number]) with EF 45-50%, mild LVH, mildly decreased RV systolic function. Primarily nonischemic cardiomyopathy.  - Cardiolite (4/21): Fixed apical defect.  - Cardiac MRI (9/21): Mild LV dilation with mild LV hypertrophy, EF 39%, prominent trabeculations concerning for noncompaction. Mild RV dilation with EF 28%,  severe LAE.  No delayed enhancement.  - PYP scan (8/21): grade 1, H/CL 1-1.5.  Unlikely to have cardiac amyloidosis.  - LHC/RHC (12/21): small OM2 totally occluded, 80-90% mid small PLOM (no interventional target); mean RA 5, PA 25/9, mean PCWP 5, CI 2.77 Fick/3.29 Thermo.  - St Jude CRT-P  - Echo (5/23): EF 35-40%, diffuse hypokinesis, mildly decreased RV systolic function, IVC normal.  8. Orthostatic hypotension.  9. CAD: Coronary angiography  12/21 with small OM2 totally occluded, 80-90% mid small PLOM (no interventional target) 10. Carotid ultrasound (8/22): mild BICA stenosis.  11. COVID-19 9/22 12. PVCs  Social History   Socioeconomic History   Marital status: Married    Spouse name: Not on file   Number of children: Not on file   Years of education: Not on file   Highest education level: Not on file  Occupational History   Not on file  Tobacco Use   Smoking status: Never   Smokeless tobacco: Never  Vaping Use   Vaping Use: Never used  Substance and Sexual Activity   Alcohol use: Yes    Alcohol/week: 1.0 standard drink of alcohol    Types: 1 Cans of beer per week    Comment: Rare   Drug use: No   Sexual activity: Not Currently  Other Topics Concern   Not on file  Social History Narrative   Not on file   Social Determinants of Health   Financial Resource Strain: Not on file  Food Insecurity: Not on file  Transportation Needs: Not on file  Physical Activity: Not on file  Stress: Not on file  Social Connections: Not on file  Intimate Partner Violence: Not on file   Family History  Problem Relation Age of Onset   Lung cancer Mother        smoker   ROS: All systems reviewed and negative except as per HPI.   Current Outpatient Medications  Medication Sig Dispense Refill   acetaminophen (TYLENOL) 500 MG tablet Take 1,000 mg by mouth every 6 (six) hours as needed (PAIN).     apixaban (ELIQUIS) 5 MG TABS tablet Take 1 tablet (5 mg total) by mouth 2 (two) times daily. 60 tablet 0   cetirizine (ZYRTEC) 10 MG tablet Take 10 mg by mouth at bedtime.     Cholecalciferol (VITAMIN D3) 50 MCG (2000 UT) TABS Take 2,000 Units by mouth daily.     eplerenone (INSPRA) 25 MG tablet Take 6.25 mg by mouth daily.     ezetimibe (ZETIA) 10 MG tablet Take 1 tablet (10 mg total) by mouth daily. 90 tablet 3   ferrous sulfate 325 (65 FE) MG tablet Take 325 mg by mouth in the morning.     fluticasone (FLONASE) 50 MCG/ACT nasal  spray Place 2 sprays into both nostrils 2 (two) times daily.      furosemide (LASIX) 20 MG tablet Take 1 tablet (20 mg total) by mouth daily as needed for fluid or edema. 30 tablet 6   Glucosamine Sulfate 750 MG TABS Take 750 mg by mouth in the morning and at bedtime.     losartan (COZAAR) 25 MG tablet Take 0.5 tablets (12.5 mg total) by mouth at bedtime. 45 tablet 3   Melatonin 10 MG TABS Take 10 mg by mouth at bedtime.      Multiple Vitamin (MULTIVITAMIN WITH MINERALS) TABS tablet Take 1 tablet by mouth every evening.     rosuvastatin (CRESTOR) 5 MG tablet Take 5 mg by mouth  every Monday, Wednesday, and Friday. IN THE EVENING     sildenafil (REVATIO) 20 MG tablet Take 40-100 mg by mouth daily as needed (erectile dysfunction.).   5   sodium chloride (OCEAN) 0.65 % SOLN nasal spray Place 1 spray into both nostrils as needed for congestion. 22.5 mL 0   tamsulosin (FLOMAX) 0.4 MG CAPS capsule Take 0.4 mg by mouth at bedtime.      Vericiguat (VERQUVO) 2.5 MG TABS Take 2.5 mg by mouth daily. 30 tablet 11   No current facility-administered medications for this encounter.   BP 116/80   Pulse 85   Wt 128.5 kg (283 lb 6.4 oz)   SpO2 94%   BMI 39.53 kg/m  Wt Readings from Last 3 Encounters:  07/28/22 128.5 kg (283 lb 6.4 oz)  07/07/22 127 kg (280 lb)  05/12/22 129.2 kg (284 lb 12.8 oz)   PHYSICAL EXAM: General:  Well appearing. No respiratory difficulty HEENT: normal Neck: supple. no JVD. Carotids 2+ bilat; no bruits. No lymphadenopathy or thyromegaly appreciated. Cor: PMI nondisplaced. Regular rate & rhythm. No rubs, gallops or murmurs. Lungs: clear Abdomen: obese, soft, nontender, nondistended. No hepatosplenomegaly. No bruits or masses. Good bowel sounds. Extremities: no cyanosis, clubbing, rash, edema Neuro: alert & oriented x 3, cranial nerves grossly intact. moves all 4 extremities w/o difficulty. Affect pleasant.   Assessment/Plan: 1. Chronic systolic CHF: Cardiac MRI in 9/21 with  EF 39% and evidence for noncompaction cardiomyopathy, no delayed enhancement, significant RV dysfunction with RV EF 22%.  PYP scan was not suggestive of TTR amyloidosis and no symptoms or peripheral neuropathy.  12/21 LHC showed CAD but does not explain cardiomyopathy, no interventional target.  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  Echo 10/2021 showed  EF 35-40%, diffuse hypokinesis, mildly decreased RV systolic function, IVC normal. He has a history of orthostatic hypotension which has limited medication titration but this is doing better since AV nodal ablation with St Jude CRT-P.  He is not volume overloaded by exam or Corvue. Normal impedence. 96% BiV Pacing. Stable NYHA class II symptoms.  - Continue Lasix prn.  - He did not tolerate Jardiance or Farxiga due to dizziness.  - Continue eplerenone 6.25 mg bid (failed higher doses due to hypotension) - Continue losartan 12.5 mg at bed time due to hypotension. Suspect BP will not tolerate transition to Entresto.  - Unable to tolerate Coreg or Toprol XL due to lightheadedness/low BP.  - Continue Verquvo 2.5 mg daily - Not thought to be candidate for cardiac contractility modulation due to CRT.  2. Atrial fibrillation: Persistent.  He has severe LAE. Not good candidate for atrial fibrillation ablation per EP.  He failed DCCV on amiodarone in 4/22, amiodarone stopped.  AV nodal ablation with St Jude CRT-P in 7/22. No AT on device interrogation today.  - Continue apixaban 5 mg bid. Denies bleeding issues. Check CBC today  3. OSA: Continue CPAP.  - followed by Dr. Radford Pax  4. Orthostatic hypotension: Limits medications.   - Continue compression stockings. Encouraged slow positional changes.   5. CAD: Moderate disease on cath in 12/21, no intervention target.  No chest pain.  Extent of disease does not explain cardiomyopathy.  Stable w/o CP  - He has been off Repatha. On Crestor, Lipids good 11/23, LDL 65 mg/dL. Repeat LP and  HFTs    - He is on apixaban with stable CAD, so no ASA.  - failed ? blockers due to low BP  F/u w/ Dr. Aundra Dubin in 3 months   Lyda Jester, PA-C  07/28/2022

## 2022-08-08 ENCOUNTER — Ambulatory Visit: Payer: PPO | Attending: Cardiology

## 2022-08-08 ENCOUNTER — Encounter (HOSPITAL_COMMUNITY): Payer: PPO

## 2022-08-08 DIAGNOSIS — Z950 Presence of cardiac pacemaker: Secondary | ICD-10-CM | POA: Diagnosis not present

## 2022-08-08 DIAGNOSIS — I5022 Chronic systolic (congestive) heart failure: Secondary | ICD-10-CM | POA: Diagnosis not present

## 2022-08-10 NOTE — Progress Notes (Signed)
EPIC Encounter for ICM Monitoring  Patient Name: Joshua Moran. is a 77 y.o. male Date: 08/10/2022 Primary Care Physican: Lawerance Cruel, MD Primary Cardiologist: Aundra Dubin Electrophysiologist: Quentin Ore BiV Pacing: 99% 01/12/2022 Office Weight: 285 lbs  04/15/2022 Office Weight: 283 lbs 07/06/2022 Weight: 276 lbs.  07/08/2022 Office Weight: 283 lbs                                                            Spoke with patient and heart failure questions reviewed.  Transmission results reviewed.  Pt asymptomatic for fluid accumulation.  Reports feeling well at this time and voices no complaints.     CorVue thoracic impedance suggesting normal fluid levels.    Prescribed:  Furosemide 20 mg take 1 tablet(s) (20 mg total) by mouth as directed (as needed).   Recommendations:  No changes and encouraged to call if experiencing any fluid symptoms.   Follow-up plan: ICM clinic phone appointment on 09/13/2022.   91 day device clinic remote transmission 09/12/2022.     EP/Cardiology Office Visits:    Recall 03/24/2023 with Dr Quentin Ore.    Copy of ICM check sent to Dr. Quentin Ore.    3 month ICM trend: 08/08/2022.    12-14 Month ICM trend:     Rosalene Billings, RN 08/10/2022 3:01 PM

## 2022-08-15 DIAGNOSIS — J029 Acute pharyngitis, unspecified: Secondary | ICD-10-CM | POA: Diagnosis not present

## 2022-08-15 DIAGNOSIS — Z03818 Encounter for observation for suspected exposure to other biological agents ruled out: Secondary | ICD-10-CM | POA: Diagnosis not present

## 2022-08-15 DIAGNOSIS — J0110 Acute frontal sinusitis, unspecified: Secondary | ICD-10-CM | POA: Diagnosis not present

## 2022-08-18 DIAGNOSIS — H52223 Regular astigmatism, bilateral: Secondary | ICD-10-CM | POA: Diagnosis not present

## 2022-08-18 DIAGNOSIS — H5213 Myopia, bilateral: Secondary | ICD-10-CM | POA: Diagnosis not present

## 2022-08-18 DIAGNOSIS — H524 Presbyopia: Secondary | ICD-10-CM | POA: Diagnosis not present

## 2022-08-23 DIAGNOSIS — I4891 Unspecified atrial fibrillation: Secondary | ICD-10-CM | POA: Diagnosis not present

## 2022-08-23 DIAGNOSIS — E7800 Pure hypercholesterolemia, unspecified: Secondary | ICD-10-CM | POA: Diagnosis not present

## 2022-08-23 DIAGNOSIS — M179 Osteoarthritis of knee, unspecified: Secondary | ICD-10-CM | POA: Diagnosis not present

## 2022-08-23 DIAGNOSIS — N401 Enlarged prostate with lower urinary tract symptoms: Secondary | ICD-10-CM | POA: Diagnosis not present

## 2022-08-23 DIAGNOSIS — I5022 Chronic systolic (congestive) heart failure: Secondary | ICD-10-CM | POA: Diagnosis not present

## 2022-08-26 ENCOUNTER — Encounter: Payer: Self-pay | Admitting: Cardiology

## 2022-08-26 DIAGNOSIS — I5022 Chronic systolic (congestive) heart failure: Secondary | ICD-10-CM

## 2022-08-26 DIAGNOSIS — G4733 Obstructive sleep apnea (adult) (pediatric): Secondary | ICD-10-CM

## 2022-09-01 DIAGNOSIS — J310 Chronic rhinitis: Secondary | ICD-10-CM | POA: Diagnosis not present

## 2022-09-01 DIAGNOSIS — J342 Deviated nasal septum: Secondary | ICD-10-CM | POA: Diagnosis not present

## 2022-09-01 DIAGNOSIS — J343 Hypertrophy of nasal turbinates: Secondary | ICD-10-CM | POA: Diagnosis not present

## 2022-09-06 DIAGNOSIS — G4733 Obstructive sleep apnea (adult) (pediatric): Secondary | ICD-10-CM | POA: Diagnosis not present

## 2022-09-12 ENCOUNTER — Ambulatory Visit (INDEPENDENT_AMBULATORY_CARE_PROVIDER_SITE_OTHER): Payer: PPO

## 2022-09-12 DIAGNOSIS — I447 Left bundle-branch block, unspecified: Secondary | ICD-10-CM | POA: Diagnosis not present

## 2022-09-12 LAB — CUP PACEART REMOTE DEVICE CHECK
Battery Remaining Longevity: 81 mo
Battery Remaining Percentage: 82 %
Battery Voltage: 3.01 V
Date Time Interrogation Session: 20240408024904
Implantable Lead Connection Status: 753985
Implantable Lead Connection Status: 753985
Implantable Lead Implant Date: 20220708
Implantable Lead Implant Date: 20220708
Implantable Lead Location: 753858
Implantable Lead Location: 753860
Implantable Pulse Generator Implant Date: 20220708
Lead Channel Impedance Value: 1125 Ohm
Lead Channel Impedance Value: 580 Ohm
Lead Channel Pacing Threshold Amplitude: 0.5 V
Lead Channel Pacing Threshold Amplitude: 0.75 V
Lead Channel Pacing Threshold Pulse Width: 0.5 ms
Lead Channel Pacing Threshold Pulse Width: 0.5 ms
Lead Channel Sensing Intrinsic Amplitude: 10.3 mV
Lead Channel Setting Pacing Amplitude: 2.5 V
Lead Channel Setting Pacing Amplitude: 2.5 V
Lead Channel Setting Pacing Pulse Width: 0.5 ms
Lead Channel Setting Pacing Pulse Width: 0.5 ms
Lead Channel Setting Sensing Sensitivity: 4 mV
Pulse Gen Model: 3562
Pulse Gen Serial Number: 3913632

## 2022-09-14 ENCOUNTER — Ambulatory Visit: Payer: PPO | Attending: Cardiology

## 2022-09-14 DIAGNOSIS — I5042 Chronic combined systolic (congestive) and diastolic (congestive) heart failure: Secondary | ICD-10-CM

## 2022-09-14 DIAGNOSIS — Z950 Presence of cardiac pacemaker: Secondary | ICD-10-CM

## 2022-09-15 NOTE — Progress Notes (Signed)
EPIC Encounter for ICM Monitoring  Patient Name: Keyan Wean. is a 77 y.o. male Date: 09/15/2022 Primary Care Physican: Daisy Floro, MD Primary Cardiologist: Shirlee Latch Electrophysiologist: Lalla Brothers BiV Pacing: 95% 01/12/2022 Office Weight: 285 lbs  04/15/2022 Office Weight: 283 lbs 07/06/2022 Weight: 276 lbs.  07/08/2022 Office Weight: 283 lbs                                                            Spoke with patient and heart failure questions reviewed.  Transmission results reviewed.  Pt asymptomatic for fluid accumulation.  Reports feeling well at this time and voices no complaints.     CorVue thoracic impedance suggesting normal fluid levels.    Prescribed:  Furosemide 20 mg take 1 tablet(s) (20 mg total) by mouth as directed (as needed).   Recommendations:  No changes and encouraged to call if experiencing any fluid symptoms.   Follow-up plan: ICM clinic phone appointment on 10/17/2022.   91 day device clinic remote transmission 12/12/2022.     EP/Cardiology Office Visits:   10/28/2022 with Dr Shirlee Latch Recall 03/24/2023 with Dr Lalla Brothers.    Copy of ICM check sent to Dr. Lalla Brothers.    3 month ICM trend: 09/12/2022.    12-14 Month ICM trend:     Karie Soda, RN 09/15/2022 4:16 PM

## 2022-10-04 ENCOUNTER — Encounter: Payer: Self-pay | Admitting: Pharmacist

## 2022-10-04 ENCOUNTER — Telehealth: Payer: Self-pay | Admitting: Pharmacist

## 2022-10-04 MED ORDER — REPATHA SURECLICK 140 MG/ML ~~LOC~~ SOAJ: 140.0000 mg | SUBCUTANEOUS | 3 refills | Status: DC

## 2022-10-04 NOTE — Telephone Encounter (Signed)
Pt called back stating he is interested in restarting Repatha.

## 2022-10-04 NOTE — Telephone Encounter (Signed)
Pt's wife was seen in lipid clinic yesterday. I called her today to follow up with med plan and she mentioned that pt (her husband) used to take Repatha but stopped she thinks due to cost. She thinks he may want to resume med but is not 100% sure. Advised her to discuss with pt and if he is interested in restarting, to give [place] a call and let us know. Will likely need new PA, and would need new enrollment into [employer].

## 2022-10-04 NOTE — Addendum Note (Signed)
Addended by: Logyn Kendrick E on: 10/04/2022 03:26 PM   Modules accepted: Orders

## 2022-10-04 NOTE — Telephone Encounter (Signed)
Prior auth submitted, Key: [facility], received message prior auth already approved. Rx sent to pharmacy, I have re-enrolled pt in [facility] grant and sent him info in a [employer].

## 2022-10-17 ENCOUNTER — Ambulatory Visit: Payer: PPO | Attending: Cardiology

## 2022-10-17 DIAGNOSIS — Z950 Presence of cardiac pacemaker: Secondary | ICD-10-CM | POA: Diagnosis not present

## 2022-10-17 DIAGNOSIS — I5042 Chronic combined systolic (congestive) and diastolic (congestive) heart failure: Secondary | ICD-10-CM | POA: Diagnosis not present

## 2022-10-19 NOTE — Progress Notes (Signed)
EPIC Encounter for ICM Monitoring  Patient Name: Joshua Moran. is a 77 y.o. male Date: 10/19/2022 Primary Care Physican: Daisy Floro, MD Primary Cardiologist: Shirlee Latch Electrophysiologist: Lalla Brothers BiV Pacing: 94% 01/12/2022 Office Weight: 285 lbs  04/15/2022 Office Weight: 283 lbs 07/06/2022 Weight: 276 lbs.  07/08/2022 Office Weight: 283 lbs 10/19/2022 Weight: 279 lbs (276 lbs)                                                            Spoke with patient and heart failure questions reviewed.  Transmission results reviewed.  Pt had weight gain during decreased impedance due to several celebrations for his birthday.  He did take PRN Furosemide twice during decreased impedance.    CorVue thoracic impedance suggesting normal fluid levels with exception of possible fluid accumulation from 4/30-5/7.   Prescribed:  Furosemide 20 mg take 1 tablet(s) (20 mg total) by mouth as directed (as needed).   Recommendations:  No changes and encouraged to call if experiencing any fluid symptoms.   Follow-up plan: ICM clinic phone appointment on 11/21/2022.   91 day device clinic remote transmission 12/12/2022.     EP/Cardiology Office Visits:   10/28/2022 with Dr Shirlee Latch.   Recall 03/24/2023 with Dr Lalla Brothers.    Copy of ICM check sent to Dr. Lalla Brothers.    3 month ICM trend: 10/17/2022.    12-14 Month ICM trend:     Karie Soda, RN 10/19/2022 8:19 AM

## 2022-10-19 NOTE — Progress Notes (Signed)
Remote pacemaker transmission.   

## 2022-10-28 ENCOUNTER — Ambulatory Visit (HOSPITAL_COMMUNITY)
Admission: RE | Admit: 2022-10-28 | Discharge: 2022-10-28 | Disposition: A | Discharge status: Home / Self Care | Payer: PPO | Source: Ambulatory Visit | Attending: Cardiology | Admitting: Cardiology

## 2022-10-28 ENCOUNTER — Encounter (HOSPITAL_COMMUNITY): Payer: Self-pay | Admitting: Cardiology

## 2022-10-28 VITALS — BP 90/50 | HR 70 | Wt 284.2 lb

## 2022-10-28 DIAGNOSIS — I5042 Chronic combined systolic (congestive) and diastolic (congestive) heart failure: Secondary | ICD-10-CM | POA: Insufficient documentation

## 2022-10-28 DIAGNOSIS — Z7901 Long term (current) use of anticoagulants: Secondary | ICD-10-CM | POA: Insufficient documentation

## 2022-10-28 DIAGNOSIS — I424 Endocardial fibroelastosis: Secondary | ICD-10-CM | POA: Diagnosis not present

## 2022-10-28 DIAGNOSIS — I4821 Permanent atrial fibrillation: Secondary | ICD-10-CM | POA: Insufficient documentation

## 2022-10-28 DIAGNOSIS — I2510 Atherosclerotic heart disease of native coronary artery without angina pectoris: Secondary | ICD-10-CM | POA: Diagnosis not present

## 2022-10-28 DIAGNOSIS — G4733 Obstructive sleep apnea (adult) (pediatric): Secondary | ICD-10-CM | POA: Diagnosis not present

## 2022-10-28 LAB — BASIC METABOLIC PANEL
Anion gap: 6 (ref 5–15)
BUN: 14 mg/dL (ref 8–23)
CO2: 26 mmol/L (ref 22–32)
Calcium: 8.9 mg/dL (ref 8.9–10.3)
Chloride: 106 mmol/L (ref 98–111)
Creatinine, Ser: 0.97 mg/dL (ref 0.61–1.24)
GFR, Estimated: >60 mL/min (ref >60)
Glucose, Bld: 80 mg/dL (ref 70–99)
Potassium: 4.3 mmol/L (ref 3.5–5.1)
Sodium: 138 mmol/L (ref 135–145)

## 2022-10-28 LAB — BRAIN NATRIURETIC PEPTIDE: B Natriuretic Peptide: 84.1 pg/mL (ref 0.0–100.0)

## 2022-10-28 MED ORDER — EPLERENONE 25 MG PO TABS: 25.0000 mg | ORAL_TABLET | Freq: Every day | ORAL | 3 refills | Status: DC

## 2022-10-28 NOTE — Patient Instructions (Addendum)
INCREASE  Eplerenone to 25 mg daily.  Labs done today, your results will be available in MyChart, we will contact you for abnormal readings.  Repeat blood work in 10 days.  Your physician has requested that you have an echocardiogram. Echocardiography is a painless test that uses sound waves to create images of your heart. It provides your doctor with information about the size and shape of your heart and how well your heart's chambers and valves are working. This procedure takes approximately one hour. There are no restrictions for this procedure. Please do NOT wear cologne, perfume, aftershave, or lotions (deodorant is allowed). Please arrive 15 minutes prior to your appointment time.  Please follow up with our heart failure pharmacist in 3 weeks  Your physician recommends that you schedule a follow-up appointment in: 3 months  If you have any questions or concerns before your next appointment please send [place] a message through [place] or call our office at [phone].    TO LEAVE A MESSAGE FOR THE NURSE SELECT OPTION 2, PLEASE LEAVE A MESSAGE INCLUDING: YOUR NAME DATE OF BIRTH CALL BACK NUMBER REASON FOR CALL**this is important as we prioritize the call backs  YOU WILL RECEIVE A CALL BACK THE SAME DAY AS LONG AS YOU CALL BEFORE 4:00 PM  At the [facility], you and your health needs are our priority. As part of our continuing mission to provide you with exceptional heart care, we have created designated Provider Care Teams. These Care Teams include your primary Cardiologist (physician) and Advanced Practice Providers (APPs- Physician Assistants and Nurse Practitioners) who all work together to provide you with the care you need, when you need it.   You may see any of the following providers on your designated Care Team at your next follow up: Dr Arvilla Meres Dr Marca Ancona Dr. Marcos Eke, NP Robbie Lis, [place] [facility] [place], [place] Brynda Peon, NP Karle Plumber, PharmD   Please be sure to bring in all your medications bottles to every appointment.    Thank you for choosing [facility]

## 2022-10-29 NOTE — Progress Notes (Signed)
[facility] Progress Note    PCP: Daisy Floro, MD Cardiology: Dr. Mayford Knife HF Cardiology: Dr. Shirlee Latch  77 y.o. with history of chronic systolic CHF and paroxysmal atrial fibrillation was referred by Dr. Mayford Knife for evaluation of CHF.  Patient has been short of breath for years, worse over the last 6 months.  He has had an extensive workup.  Echo in 3/21 showed EF 45-50% with mildly decreased RV function.  Cardiolite in 4/21 showed fixed apical defect.  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  PYP scan in 8/21 was not suggestive of TTR cardiac amyloidosis.   He had been in persistent atrial fibrillation for about 2 years.  He saw Dr. Lalla Brothers and was started on amiodarone.  DCCV was done in 10/21 back to NSR.  He was found to have M-spike on SPEP.  He saw Dr. Myna Hidalgo who thought this was MGUS.   LHC/RHC in 12/21 showed occluded small OM2 and 80-90% stenosis mid PLOM (small caliber), no interventional target; normal filling pressures and preserved cardiac output.   He was unable to tolerate Jardiance due to extreme dizziness. He is off both Coreg and Toprol XL due to lightheadedness/hypotension.   He developed recurrent atrial fibrillation and failed DCCV in 4/22.  He has been in persistent atrial fibrillation, Dr Lalla Brothers stopped his amiodarone due to futility.  In 7/22, he had AV nodal ablation with St Jude CRT-P device placement.   Echo in 5/22 showed EF 35-40%, mild LV enlargement, normal RV.  Echo in 5/23 showed EF 35-40%, diffuse hypokinesis. RV mildly reduced.   He has had issues recently tolerating GDMT due to low BP.   He presents today for followup of CHF.  Occasional lightheadedness but generally less of this than in the past.  No falls.  No syncope.  Wears compression stockings.  Weight stable.  Using CPAP.  He was off Repatha for a while, just restarted.  Rarely takes  Lasix.  No dyspnea walking on flat ground, mild dyspnea with hills/stairs.  Rare atypical chest pain.    ECG (personally reviewed): atrial fibrillation, BiV pacing  Labs (7/21): Urine immunofixation with monoclonal IgG protein, SPEP with M-spike.   Labs (10/21): TSH normal, K 4, creatinine 0.97, LFTs normal Labs (11/21): K 4.8, creatinine 1.07, hgb 14.7, LFTs normal, TSH normal Labs (12/21): LDL 66, HDL 45 Labs (1/22): K 3.9, creatinine 1.07 Labs (4/22): K 4.5, creatinine 1.14, TSH normal, hgb 13.8 Labs (7/22): K 4.5, creatinine 1.0 Labs (8/22): LDL 57 Labs (10/22): K 4.5, creatinine 1.07 Labs (3/23): K 3.8, creatinine 0.99 Labs (8/23): creatinine 0.77, LDL 65 Labs (2/24): K 3.9, creatinine 0.93, LFTs normal, LDL 86  St Jude device interrogation: 95% BiV paced, stable thoracic impedance.    PMH: 1. CVA: Occurred in 3/21, he was not on apixaban at the time.  2. GERD 3. OSA: Uses CPAP.  4. Hyperlipidemia 5. Atrial fibrillation: Permanent - DCCV to NSR in 10/21.  - Failed DCCV in 4/22, stopped amiodarone.  - AV nodal ablation with St Jude CRT-P in 7/22.  6. MGUS 7. Chronic systolic CHF: Echo (3/21) with EF 45-50%, mild LVH, mildly decreased RV systolic function. Primarily nonischemic cardiomyopathy.  - Cardiolite (4/21): Fixed apical defect.  - Cardiac MRI (9/21): Mild LV dilation with mild LV hypertrophy, EF 39%, prominent trabeculations concerning for noncompaction. Mild RV dilation with EF 28%, severe LAE.  No delayed enhancement.  - PYP scan (8/21): grade 1, H/CL 1-1.5.  Unlikely to have cardiac amyloidosis.  - LHC/RHC (12/21): small OM2 totally occluded, 80-90% mid small PLOM (no interventional target); mean RA 5, PA 25/9, mean PCWP 5, CI 2.77 Fick/3.29 Thermo.  - St Jude CRT-P  - Echo (5/23): EF 35-40%, diffuse hypokinesis, mildly decreased RV systolic function, IVC normal.  8. Orthostatic hypotension.  9. CAD: Coronary angiography 12/21 with small OM2 totally occluded, 80-90%  mid small PLOM (no interventional target) 10. Carotid ultrasound (8/22): mild BICA stenosis.  11. COVID-19 9/22 12. PVCs  Social History   Socioeconomic History   Marital status: Married    Spouse name: Not on file   Number of children: Not on file   Years of education: Not on file   Highest education level: Not on file  Occupational History   Not on file  Tobacco Use   Smoking status: Never   Smokeless tobacco: Never  Vaping Use   Vaping Use: Never used  Substance and Sexual Activity   Alcohol use: Yes    Alcohol/week: 1.0 standard drink of alcohol    Types: 1 Cans of beer per week    Comment: Rare   Drug use: No   Sexual activity: Not Currently  Other Topics Concern   Not on file  Social History Narrative   Not on file   Social Determinants of Health   Financial Resource Strain: Not on file  Food Insecurity: Not on file  Transportation Needs: Not on file  Physical Activity: Not on file  Stress: Not on file  Social Connections: Not on file  Intimate Partner Violence: Not on file   Family History  Problem Relation Age of Onset   Lung cancer Mother        smoker   ROS: All systems reviewed and negative except as per HPI.   Current Outpatient Medications  Medication Sig Dispense Refill   acetaminophen (TYLENOL) 500 MG tablet Take 1,000 mg by mouth every 6 (six) hours as needed (PAIN).     apixaban (ELIQUIS) 5 MG TABS tablet Take 1 tablet (5 mg total) by mouth 2 (two) times daily. 60 tablet 0   cetirizine (ZYRTEC) 10 MG tablet Take 10 mg by mouth at bedtime.     Cholecalciferol (VITAMIN D3) 50 MCG (2000 UT) TABS Take 2,000 Units by mouth daily.     Evolocumab (REPATHA SURECLICK) 140 MG/ML SOAJ Inject 140 mg into the skin every 14 (fourteen) days. 6 mL 3   ezetimibe (ZETIA) 10 MG tablet Take 1 tablet (10 mg total) by mouth daily. 90 tablet 3   ferrous sulfate 325 (65 FE) MG tablet Take 325 mg by mouth in the morning.     fluticasone (FLONASE) 50 MCG/ACT nasal  spray Place 2 sprays into both nostrils 2 (two) times daily.      furosemide (LASIX) 20 MG tablet Take 1 tablet (20 mg total) by mouth daily as needed for fluid or edema. 30 tablet 6   Glucosamine Sulfate 750 MG TABS Take 750 mg by mouth in the morning and at bedtime.     losartan (COZAAR) 25 MG tablet Take 0.5 tablets (12.5 mg total) by mouth at bedtime. 45 tablet 3   Melatonin 10 MG TABS Take 10 mg by mouth at bedtime.      Multiple Vitamin (MULTIVITAMIN WITH MINERALS) TABS tablet Take 1 tablet by mouth every evening.     rosuvastatin (CRESTOR) 5 MG tablet Take 5  mg by mouth every Monday, Wednesday, and Friday. IN THE EVENING     sodium chloride (OCEAN) 0.65 % SOLN nasal spray Place 1 spray into both nostrils as needed for congestion. 22.5 mL 0   tamsulosin (FLOMAX) 0.4 MG CAPS capsule Take 0.4 mg by mouth at bedtime.      Vericiguat (VERQUVO) 2.5 MG TABS Take 2.5 mg by mouth daily. 30 tablet 11   eplerenone (INSPRA) 25 MG tablet Take 1 tablet (25 mg total) by mouth daily. 90 tablet 3   No current facility-administered medications for this encounter.   BP (!) 90/50   Pulse 70   Wt 128.9 kg (284 lb 3.2 oz)   SpO2 94%   BMI 39.64 kg/m  Wt Readings from Last 3 Encounters:  10/28/22 128.9 kg (284 lb 3.2 oz)  07/28/22 128.5 kg (283 lb 6.4 oz)  07/07/22 127 kg (280 lb)   PHYSICAL EXAM: General: NAD Neck: No JVD, no thyromegaly or thyroid nodule.  Lungs: Clear to auscultation bilaterally with normal respiratory effort. CV: Nondisplaced PMI.  Heart regular S1/S2, no S3/S4, no murmur.  No peripheral edema.  No carotid bruit.  Normal pedal pulses.  Abdomen: Soft, nontender, no hepatosplenomegaly, no distention.  Skin: Intact without lesions or rashes.  Neurologic: Alert and oriented x 3.  Psych: Normal affect. Extremities: No clubbing or cyanosis.  HEENT: Normal.   Assessment/Plan: 1. Chronic systolic CHF: Cardiac MRI in 9/21 with EF 39% and evidence for noncompaction cardiomyopathy, no  delayed enhancement, significant RV dysfunction with RV EF 22%.  PYP scan was not suggestive of TTR amyloidosis and no symptoms or peripheral neuropathy.  12/21 LHC showed CAD but does not explain cardiomyopathy, no interventional target.  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  Echo 10/2021 showed  EF 35-40%, diffuse hypokinesis, mildly decreased RV systolic function, IVC normal. He has a history of orthostatic hypotension which has limited medication titration but this is doing better since AV nodal ablation with St Jude CRT-P.  Not volume overloaded by exam or Corvue.  95% BiV Pacing. Stable NYHA class II symptoms.  - Continue Lasix prn.  - He did not tolerate Jardiance or Farxiga due to dizziness.  - Increase eplerenone to 25 mg qhs.  BMET/BNP today and BMET 10 days.  - Continue losartan 12.5 mg at bed time due to hypotension. Suspect BP will not tolerate transition to Entresto.  - Unable to tolerate Coreg or Toprol XL due to lightheadedness/low BP.  - Continue Verquvo 2.5 mg daily, can increase in future.  - Not candidate for cardiac contractility modulation due to CRT.  - Arrange for repeat echo.  2. Atrial fibrillation: Permanent.  He has severe LAE. Not good candidate for atrial fibrillation ablation per EP.  He failed DCCV on amiodarone in 4/22, amiodarone stopped.  AV nodal ablation with St Jude CRT-P in 7/22. - Continue apixaban 5 mg bid.  3. OSA: Continue CPAP.  4. Orthostatic hypotension: Limits medications.   - Continue compression stockings. Encouraged slow positional changes.   5. CAD: Moderate disease on cath in 12/21, no interventional target.  No chest pain.  Extent of disease does not explain cardiomyopathy.   - He is on Crestor 5 mg qod and has just restarted Repatha.  Check lipids next visit when he has been on Repatha for some time.   - He is on apixaban with stable CAD, so no ASA.   F/u with HF pharmacist in 3 wks to try to titrate  Verquvo.  See  APP in 3 months.   Marca Ancona,  10/29/2022

## 2022-11-04 ENCOUNTER — Other Ambulatory Visit: Payer: Self-pay | Admitting: Cardiology

## 2022-11-04 DIAGNOSIS — I5022 Chronic systolic (congestive) heart failure: Secondary | ICD-10-CM | POA: Diagnosis not present

## 2022-11-04 DIAGNOSIS — E7800 Pure hypercholesterolemia, unspecified: Secondary | ICD-10-CM | POA: Diagnosis not present

## 2022-11-04 DIAGNOSIS — N401 Enlarged prostate with lower urinary tract symptoms: Secondary | ICD-10-CM | POA: Diagnosis not present

## 2022-11-04 DIAGNOSIS — D649 Anemia, unspecified: Secondary | ICD-10-CM | POA: Diagnosis not present

## 2022-11-04 DIAGNOSIS — I4891 Unspecified atrial fibrillation: Secondary | ICD-10-CM | POA: Diagnosis not present

## 2022-11-04 NOTE — Progress Notes (Signed)
PCP: Daisy Floro, MD Cardiology: Dr. Mayford Knife HF Cardiology: Dr. Shirlee Latch  HPI:  77 y.o. with history of chronic systolic CHF and paroxysmal atrial fibrillation was referred by Dr. Mayford Knife for evaluation of CHF.  Patient had been short of breath for years, worse over the last 6 months.  He has had an extensive workup.  Echo in 08/2019 showed EF 45-50% with mildly decreased RV function.  Cardiolite in 09/2019 showed fixed apical defect.  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  PYP scan in 01/2020 was not suggestive of TTR cardiac amyloidosis.   He had been in persistent atrial fibrillation for about 2 years.  He saw Dr. Lalla Brothers and was started on amiodarone.  DCCV was done in 03/2020 back to NSR. He was found to have M-spike on SPEP.  He saw Dr. Myna Hidalgo who thought this was MGUS.    LHC/RHC in 05/2020 showed occluded small OM2 and 80-90% stenosis mid PLOM (small caliber), no interventional target; normal filling pressures and preserved cardiac output.    He was unable to tolerate Jardiance due to extreme dizziness. He was taken off both carvedilol and metoprolol XL due to lightheadedness/hypotension.    He developed recurrent atrial fibrillation and failed DCCV in 09/2020.  He had been in persistent atrial fibrillation, Dr Lalla Brothers stopped his amiodarone due to futility.  In 12/2020, he had AV nodal ablation with St Jude CRT-P device placement.    Echo in 10/2020 showed EF 35-40%, mild LV enlargement, normal RV.  Echo in 10/2021 showed EF 35-40%, diffuse hypokinesis. RV mildly reduced.   He has had issues recently tolerating GDMT due to low BP.   Recently returned to HF clinic for follow up with Dr. Shirlee Latch on 10/28/22.  Noted occasional lightheadedness but generally less of this than in the past. No falls. No syncope. Wears compression stockings. Weight was stable. Using CPAP. He was off Repatha for a while, just  restarted. Reported he rarely takes Lasix. No dyspnea walking on flat ground, mild dyspnea with hills/stairs. Rare atypical chest pain.    Today he returns to HF clinic for pharmacist medication titration with his wife. At last visit with MD, eplerenone was increased to 25 mg daily. Overall patient is doing well today. Does note some dizziness, but these episodes tend to only last a few seconds (no longer than one minute) and he states the dizziness is the same even when his medication doses were lower. Does note that he goes through periods of time of less and more dizziness, which he attributes to his salt intake. No CP or palpitations. No dyspnea walking on flat ground, mild dyspnea with hills/stairs. Weight at home has been stable at 280 lbs. He will take PRN Lasix if he gains 1-2 lbs (usually related to excess salt intake). He takes PRN Lasix a couple times per month. No PND or orthopnea. Appetite is good. Taking and tolerating all medications. Does not check his BP at home as much as he used to, but when he does, SBP tends to run ~110-120s. He brought medication list and log of weights/BP to clinic for review.    HF Medications: Losartan 12.5 mg daily Eplerenone 25 mg daily Verquvo 2.5 mg daily Lasix 20 mg PRN  Has the patient been experiencing any side effects to the medications prescribed?  no  Does the patient have any problems obtaining medications due to transportation or finances?  Has [employer] Medicare. Has [employer] grant to help with cost of Verquvo.   Understanding of regimen: good Understanding of indications: good Potential of compliance: good Patient understands to avoid NSAIDs. Patient understands to avoid decongestants.    Pertinent Lab Values: 10/28/22: Serum creatinine 0.97, BUN 14, Potassium 4.3, Sodium 138  11/08/22: Serum creatinine 0.95, BUN 13, Potassium 4.1, Sodium 137   Vital Signs:  Weight: 284.0 lbs (last clinic weight: 284.2 lbs) Blood pressure:  108/64 Heart rate: 69   Assessment/Plan: 1. Chronic systolic CHF: Cardiac MRI in 02/2020 with EF 39% and evidence for noncompaction cardiomyopathy, no delayed enhancement, significant RV dysfunction with RV EF 22%.  PYP scan was not suggestive of TTR amyloidosis and no symptoms or peripheral neuropathy.  05/2020 LHC showed CAD but does not explain cardiomyopathy, no interventional target.  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  Echo 10/2021 showed  EF 35-40%, diffuse hypokinesis, mildly decreased RV systolic function, IVC normal. He has a history of orthostatic hypotension which has limited medication titration but this is doing better since AV nodal ablation with St Jude CRT-P.    - NYHA class II symptoms. Volume stable on exam.  - Continue Lasix 20 mg PRN.  - Continue losartan 12.5 mg at bed time due to hypotension. Suspect BP will not tolerate transition to Entresto.  - Continue eplerenone 25 mg daily.  - Increase Verquvo to 5 mg daily.   - Unable to tolerate carvedilol or metoprolol XL due to lightheadedness/low BP.  - He did not tolerate Jardiance or Farxiga due to dizziness.  - Not thought to be candidate for cardiac contractility modulation due to CRT.  2. Atrial fibrillation: Permanent.  He has severe LAE. Not good candidate for atrial fibrillation ablation per EP.  He failed DCCV on amiodarone in 09/2020, amiodarone stopped.  AV nodal ablation with St Jude CRT-P in 12/2020. - Continue apixaban 5 mg BID.  3. OSA: Continue CPAP.  4. Orthostatic hypotension: Limits medications.   - Continue compression stockings. Encouraged slow positional changes.   5. CAD: Moderate disease on cath in 05/2020, no interventional target.  No chest pain.  Extent of disease does not explain cardiomyopathy.   - He is on Crestor 5 mg MWF and has restarted Repatha.  Check lipids next visit when he has been on Repatha for some time.   - He is on apixaban with stable CAD, so no ASA.      Follow up 2 months with [facility] Clinic   Karle Plumber, PharmD, BCPS, BCCP, CPP Heart Failure Clinic Pharmacist [phone]

## 2022-11-08 ENCOUNTER — Ambulatory Visit (HOSPITAL_COMMUNITY)
Admission: RE | Admit: 2022-11-08 | Discharge: 2022-11-08 | Disposition: A | Discharge status: Home / Self Care | Payer: PPO | Source: Ambulatory Visit | Attending: Cardiology | Admitting: Cardiology

## 2022-11-08 DIAGNOSIS — I5042 Chronic combined systolic (congestive) and diastolic (congestive) heart failure: Secondary | ICD-10-CM | POA: Diagnosis not present

## 2022-11-08 LAB — BASIC METABOLIC PANEL
Anion gap: 7 (ref 5–15)
BUN: 13 mg/dL (ref 8–23)
CO2: 24 mmol/L (ref 22–32)
Calcium: 8.6 mg/dL — ABNORMAL LOW (ref 8.9–10.3)
Chloride: 106 mmol/L (ref 98–111)
Creatinine, Ser: 0.95 mg/dL (ref 0.61–1.24)
GFR, Estimated: >60 mL/min (ref >60)
Glucose, Bld: 102 mg/dL — ABNORMAL HIGH (ref 70–99)
Potassium: 4.1 mmol/L (ref 3.5–5.1)
Sodium: 137 mmol/L (ref 135–145)

## 2022-11-16 ENCOUNTER — Ambulatory Visit (HOSPITAL_COMMUNITY)
Admission: RE | Admit: 2022-11-16 | Discharge: 2022-11-16 | Disposition: A | Discharge status: Home / Self Care | Payer: PPO | Source: Ambulatory Visit | Attending: Internal Medicine | Admitting: Internal Medicine

## 2022-11-16 VITALS — BP 108/64 | HR 69 | Wt 284.0 lb

## 2022-11-16 DIAGNOSIS — Z79899 Other long term (current) drug therapy: Secondary | ICD-10-CM | POA: Insufficient documentation

## 2022-11-16 DIAGNOSIS — I428 Other cardiomyopathies: Secondary | ICD-10-CM | POA: Insufficient documentation

## 2022-11-16 DIAGNOSIS — G4733 Obstructive sleep apnea (adult) (pediatric): Secondary | ICD-10-CM | POA: Insufficient documentation

## 2022-11-16 DIAGNOSIS — I5022 Chronic systolic (congestive) heart failure: Secondary | ICD-10-CM | POA: Diagnosis not present

## 2022-11-16 DIAGNOSIS — I110 Hypertensive heart disease with heart failure: Secondary | ICD-10-CM | POA: Diagnosis not present

## 2022-11-16 DIAGNOSIS — I3481 Nonrheumatic mitral (valve) annulus calcification: Secondary | ICD-10-CM | POA: Insufficient documentation

## 2022-11-16 DIAGNOSIS — Z950 Presence of cardiac pacemaker: Secondary | ICD-10-CM | POA: Insufficient documentation

## 2022-11-16 DIAGNOSIS — I5042 Chronic combined systolic (congestive) and diastolic (congestive) heart failure: Secondary | ICD-10-CM | POA: Diagnosis present

## 2022-11-16 DIAGNOSIS — I951 Orthostatic hypotension: Secondary | ICD-10-CM | POA: Insufficient documentation

## 2022-11-16 DIAGNOSIS — I4821 Permanent atrial fibrillation: Secondary | ICD-10-CM | POA: Diagnosis not present

## 2022-11-16 DIAGNOSIS — I2510 Atherosclerotic heart disease of native coronary artery without angina pectoris: Secondary | ICD-10-CM | POA: Diagnosis not present

## 2022-11-16 MED ORDER — VERQUVO 5 MG PO TABS: 5.0000 mg | ORAL_TABLET | Freq: Every day | ORAL | 11 refills | Status: DC

## 2022-11-16 NOTE — Patient Instructions (Signed)
It was a pleasure seeing you today!  MEDICATIONS: -We are changing your medications today -Increase Verquvo to 5 mg (1 tablet) daily -Call if you have questions about your medications.    NEXT APPOINTMENT: Return to clinic in 2 months with APP Clinic.  In general, to take care of your heart failure: -Limit your fluid intake to 2 Liters (half-gallon) per day.   -Limit your salt intake to ideally 2-3 grams (2000-3000 mg) per day. -Weigh yourself daily and record, and bring that "weight diary" to your next appointment.  (Weight gain of 2-3 pounds in 1 day typically means fluid weight.) -The medications for your heart are to help your heart and help you live longer.   -Please contact us before stopping any of your heart medications.  Call the clinic at [phone] with questions or to reschedule future appointments.

## 2022-11-17 ENCOUNTER — Ambulatory Visit (HOSPITAL_BASED_OUTPATIENT_CLINIC_OR_DEPARTMENT_OTHER)
Admission: RE | Admit: 2022-11-17 | Discharge: 2022-11-17 | Disposition: A | Discharge status: Home / Self Care | Payer: PPO | Source: Ambulatory Visit

## 2022-11-17 DIAGNOSIS — I5042 Chronic combined systolic (congestive) and diastolic (congestive) heart failure: Secondary | ICD-10-CM

## 2022-11-17 DIAGNOSIS — I110 Hypertensive heart disease with heart failure: Secondary | ICD-10-CM | POA: Diagnosis not present

## 2022-11-17 LAB — ECHOCARDIOGRAM COMPLETE
AR max vel: 2.5 cm2
AV Area VTI: 2.53 cm2
AV Area mean vel: 2.41 cm2
AV Mean grad: 3 mmHg
AV Peak grad: 5.6 mmHg
Ao pk vel: 1.18 m/s
Area-P 1/2: 3.91 cm2
S' Lateral: 4.3 cm

## 2022-11-21 ENCOUNTER — Ambulatory Visit: Payer: PPO | Attending: Cardiology

## 2022-11-21 DIAGNOSIS — I5022 Chronic systolic (congestive) heart failure: Secondary | ICD-10-CM | POA: Diagnosis not present

## 2022-11-21 DIAGNOSIS — Z950 Presence of cardiac pacemaker: Secondary | ICD-10-CM

## 2022-11-22 ENCOUNTER — Telehealth (HOSPITAL_COMMUNITY): Payer: Self-pay

## 2022-11-22 NOTE — Telephone Encounter (Addendum)
  Pt aware, agreeable, and verbalized understanding  ----- Message from Laurey Morale, MD sent at 11/19/2022  8:02 AM EDT ----- Difficult study but EF low normal.

## 2022-11-25 NOTE — Progress Notes (Signed)
EPIC Encounter for ICM Monitoring  Patient Name: Joshua Moran. is a 77 y.o. male Date: 11/25/2022 Primary Care Physican: Daisy Floro, MD Primary Cardiologist: Shirlee Latch Electrophysiologist: Lalla Brothers BiV Pacing: 95% 01/12/2022 Office Weight: 285 lbs  04/15/2022 Office Weight: 283 lbs 07/06/2022 Weight: 276 lbs.  07/08/2022 Office Weight: 283 lbs 10/19/2022 Weight: 279 lbs (276 lbs) 11/25/2022 Weight: 281 lbs                                                            Spoke with patient and heart failure questions reviewed.  Transmission results reviewed.  Pt reports taking Lasix once in the last few weeks and feeling fine.       CorVue thoracic impedance suggesting normal fluid levels with exception of possible fluid accumulation from 6/5-6/11.   Prescribed:  Furosemide 20 mg take 1 tablet(s) (20 mg total) by mouth as directed (as needed).   Recommendations:  No changes and encouraged to call if experiencing any fluid symptoms.   Follow-up plan: ICM clinic phone appointment on 12/26/2022.   91 day device clinic remote transmission 12/12/2022.     EP/Cardiology Office Visits:   01/10/2023 with HF clinic.   Recall 03/24/2023 with Dr Lalla Brothers.    Copy of ICM check sent to Dr. Lalla Brothers.  3 month ICM trend: 11/21/2022.    12-14 Month ICM trend:     Karie Soda, RN 11/25/2022 4:15 PM

## 2022-12-12 ENCOUNTER — Ambulatory Visit: Payer: PPO

## 2022-12-12 DIAGNOSIS — I447 Left bundle-branch block, unspecified: Secondary | ICD-10-CM | POA: Diagnosis not present

## 2022-12-12 LAB — CUP PACEART REMOTE DEVICE CHECK
Battery Remaining Longevity: 80 mo
Battery Remaining Percentage: 79 %
Battery Voltage: 3.01 V
Date Time Interrogation Session: 20240708020010
Implantable Lead Connection Status: 753985
Implantable Lead Connection Status: 753985
Implantable Lead Implant Date: 20220708
Implantable Lead Implant Date: 20220708
Implantable Lead Location: 753858
Implantable Lead Location: 753860
Implantable Pulse Generator Implant Date: 20220708
Lead Channel Impedance Value: 1175 Ohm
Lead Channel Impedance Value: 590 Ohm
Lead Channel Pacing Threshold Amplitude: 0.5 V
Lead Channel Pacing Threshold Amplitude: 0.75 V
Lead Channel Pacing Threshold Pulse Width: 0.5 ms
Lead Channel Pacing Threshold Pulse Width: 0.5 ms
Lead Channel Sensing Intrinsic Amplitude: 6.8 mV
Lead Channel Setting Pacing Amplitude: 2.5 V
Lead Channel Setting Pacing Amplitude: 2.5 V
Lead Channel Setting Pacing Pulse Width: 0.5 ms
Lead Channel Setting Pacing Pulse Width: 0.5 ms
Lead Channel Setting Sensing Sensitivity: 4 mV
Pulse Gen Model: 3562
Pulse Gen Serial Number: 3913632

## 2022-12-26 ENCOUNTER — Ambulatory Visit: Payer: PPO | Attending: Cardiology

## 2022-12-26 DIAGNOSIS — I5022 Chronic systolic (congestive) heart failure: Secondary | ICD-10-CM

## 2022-12-26 DIAGNOSIS — Z950 Presence of cardiac pacemaker: Secondary | ICD-10-CM

## 2022-12-28 NOTE — Progress Notes (Signed)
EPIC Encounter for ICM Monitoring  Patient Name: Joshua Moran. is a 77 y.o. male Date: 12/28/2022 Primary Care Physican: Daisy Floro, MD Primary Cardiologist: Shirlee Latch Electrophysiologist: Lalla Brothers BiV Pacing: 95% 01/12/2022 Office Weight: 285 lbs  04/15/2022 Office Weight: 283 lbs 07/06/2022 Weight: 276 lbs.  07/08/2022 Office Weight: 283 lbs 10/19/2022 Weight: 279 lbs (276 lbs) 11/25/2022 Weight: 281 lbs 12/28/2022 Weight: 281 lbs                                                            Spoke with patient and heart failure questions reviewed.  Transmission results reviewed.  Pt asymptomatic for fluid accumulation.  Reports feeling well at this time and voices no complaints.   He is taking a cruise 8/8.   He took 1 lasix in the last month for weight gain during decreased impedance.    CorVue thoracic impedance suggesting normal fluid levels with exception of possible fluid accumulation from 6/30-7/7.   Prescribed:  Furosemide 20 mg take 1 tablet(s) (20 mg total) by mouth as directed (as needed).   Recommendations:  No changes and encouraged to call if experiencing any fluid symptoms.   Follow-up plan: ICM clinic phone appointment on 01/30/2023.   91 day device clinic remote transmission 03/13/2023.     EP/Cardiology Office Visits:   01/10/2023 with HF clinic.   Recall 03/24/2023 with Dr Lalla Brothers.    Copy of ICM check sent to Dr. Lalla Brothers.  3 month ICM trend: 12/26/2022.    12-14 Month ICM trend:     Karie Soda, RN 12/28/2022 7:22 AM

## 2022-12-28 NOTE — Progress Notes (Signed)
Remote pacemaker transmission.   

## 2023-01-09 NOTE — Progress Notes (Signed)
[facility] Progress Note    PCP: Daisy Floro, MD Cardiology: Dr. Mayford Knife HF Cardiology: Dr. Shirlee Latch  77 y.o. with history of chronic systolic CHF and paroxysmal atrial fibrillation was referred by Dr. Mayford Knife for evaluation of CHF.  Patient has been short of breath for years, worse over the last 6 months.  He has had an extensive workup.  Echo in 3/21 showed EF 45-50% with mildly decreased RV function.  Cardiolite in 4/21 showed fixed apical defect.  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  PYP scan in 8/21 was not suggestive of TTR cardiac amyloidosis.   He had been in persistent atrial fibrillation for about 2 years.  He saw Dr. Lalla Brothers and was started on amiodarone.  DCCV was done in 10/21 back to NSR.  He was found to have M-spike on SPEP.  He saw Dr. Myna Hidalgo who thought this was MGUS.   LHC/RHC in 12/21 showed occluded small OM2 and 80-90% stenosis mid PLOM (small caliber), no interventional target; normal filling pressures and preserved cardiac output.   He was unable to tolerate Jardiance due to extreme dizziness. He is off both Coreg and Toprol XL due to lightheadedness/hypotension.   He developed recurrent atrial fibrillation and failed DCCV in 4/22.  He has been in persistent atrial fibrillation, Dr Lalla Brothers stopped his amiodarone due to futility.  In 7/22, he had AV nodal ablation with St Jude CRT-P device placement.   Echo in 5/22 showed EF 35-40%, mild LV enlargement, normal RV.  Echo in 5/23 showed EF 35-40%, diffuse hypokinesis. RV mildly reduced.   He has had issues recently tolerating GDMT due to low BP.   He presents today for followup of CHF.  Occasional lightheadedness but generally less of this than in the past.  No falls.  No syncope.  Wears compression stockings.  Weight stable.  Using CPAP.  He was off Repatha for a while, just restarted.  Rarely takes  Lasix.  No dyspnea walking on flat ground, mild dyspnea with hills/stairs.  Rare atypical chest pain.    ECG (personally reviewed): atrial fibrillation, BiV pacing  Labs (7/21): Urine immunofixation with monoclonal IgG protein, SPEP with M-spike.   Labs (10/21): TSH normal, K 4, creatinine 0.97, LFTs normal Labs (11/21): K 4.8, creatinine 1.07, hgb 14.7, LFTs normal, TSH normal Labs (12/21): LDL 66, HDL 45 Labs (1/22): K 3.9, creatinine 1.07 Labs (4/22): K 4.5, creatinine 1.14, TSH normal, hgb 13.8 Labs (7/22): K 4.5, creatinine 1.0 Labs (8/22): LDL 57 Labs (10/22): K 4.5, creatinine 1.07 Labs (3/23): K 3.8, creatinine 0.99 Labs (8/23): creatinine 0.77, LDL 65 Labs (2/24): K 3.9, creatinine 0.93, LFTs normal, LDL 86  St Jude device interrogation: 95% BiV paced, stable thoracic impedance.    PMH: 1. CVA: Occurred in 3/21, he was not on apixaban at the time.  2. GERD 3. OSA: Uses CPAP.  4. Hyperlipidemia 5. Atrial fibrillation: Permanent - DCCV to NSR in 10/21.  - Failed DCCV in 4/22, stopped amiodarone.  - AV nodal ablation with St Jude CRT-P in 7/22.  6. MGUS 7. Chronic systolic CHF: Echo (3/21) with EF 45-50%, mild LVH, mildly decreased RV systolic function. Primarily nonischemic cardiomyopathy.  - Cardiolite (4/21): Fixed apical defect.  - Cardiac MRI (9/21): Mild LV dilation with mild LV hypertrophy, EF 39%, prominent trabeculations concerning for noncompaction. Mild RV dilation with EF 28%, severe LAE.  No delayed enhancement.  - PYP scan (8/21): grade 1, H/CL 1-1.5.  Unlikely to have cardiac amyloidosis.  - LHC/RHC (12/21): small OM2 totally occluded, 80-90% mid small PLOM (no interventional target); mean RA 5, PA 25/9, mean PCWP 5, CI 2.77 Fick/3.29 Thermo.  - St Jude CRT-P  - Echo (5/23): EF 35-40%, diffuse hypokinesis, mildly decreased RV systolic function, IVC normal.  8. Orthostatic hypotension.  9. CAD: Coronary angiography 12/21 with small OM2 totally occluded, 80-90%  mid small PLOM (no interventional target) 10. Carotid ultrasound (8/22): mild BICA stenosis.  11. COVID-19 9/22 12. PVCs  Social History   Socioeconomic History   Marital status: Married    Spouse name: Not on file   Number of children: Not on file   Years of education: Not on file   Highest education level: Not on file  Occupational History   Not on file  Tobacco Use   Smoking status: Never   Smokeless tobacco: Never  Vaping Use   Vaping status: Never Used  Substance and Sexual Activity   Alcohol use: Yes    Alcohol/week: 1.0 standard drink of alcohol    Types: 1 Cans of beer per week    Comment: Rare   Drug use: No   Sexual activity: Not Currently  Other Topics Concern   Not on file  Social History Narrative   Not on file   Social Determinants of Health   Financial Resource Strain: Not on file  Food Insecurity: Not on file  Transportation Needs: Not on file  Physical Activity: Not on file  Stress: Not on file  Social Connections: Not on file  Intimate Partner Violence: Not on file   Family History  Problem Relation Age of Onset   Lung cancer Mother        smoker   ROS: All systems reviewed and negative except as per HPI.   Current Outpatient Medications  Medication Sig Dispense Refill   acetaminophen (TYLENOL) 500 MG tablet Take 1,000 mg by mouth every 6 (six) hours as needed (PAIN).     apixaban (ELIQUIS) 5 MG TABS tablet Take 1 tablet (5 mg total) by mouth 2 (two) times daily. 60 tablet 0   cetirizine (ZYRTEC) 10 MG tablet Take 10 mg by mouth at bedtime.     Cholecalciferol (VITAMIN D3) 50 MCG (2000 UT) TABS Take 2,000 Units by mouth daily.     eplerenone (INSPRA) 25 MG tablet Take 1 tablet (25 mg total) by mouth daily. 90 tablet 3   Evolocumab (REPATHA SURECLICK) 140 MG/ML SOAJ Inject 140 mg into the skin every 14 (fourteen) days. 6 mL 3   ezetimibe (ZETIA) 10 MG tablet TAKE ONE TABLET BY MOUTH ONCE DAILY 90 tablet 0   ferrous sulfate 325 (65 FE) MG  tablet Take 325 mg by mouth in the morning.     fluticasone (FLONASE) 50 MCG/ACT nasal spray Place 2 sprays into both nostrils 2 (two) times daily.      furosemide (LASIX) 20 MG tablet Take 1 tablet (20 mg total) by mouth daily as needed for fluid or edema. 30 tablet 6   Glucosamine Sulfate 750 MG TABS Take 750 mg by mouth in the morning and at bedtime.     losartan (COZAAR) 25 MG tablet Take 0.5 tablets (12.5 mg total) by mouth at bedtime. 45 tablet 3   Melatonin 10 MG TABS Take 10 mg by mouth at bedtime.      Multiple Vitamin (MULTIVITAMIN WITH MINERALS) TABS tablet Take  1 tablet by mouth every evening.     rosuvastatin (CRESTOR) 5 MG tablet Take 5 mg by mouth every Monday, Wednesday, and Friday. IN THE EVENING     sodium chloride (OCEAN) 0.65 % SOLN nasal spray Place 1 spray into both nostrils as needed for congestion. 22.5 mL 0   tamsulosin (FLOMAX) 0.4 MG CAPS capsule Take 0.4 mg by mouth at bedtime.      Vericiguat (VERQUVO) 5 MG TABS Take 1 tablet (5 mg total) by mouth daily. 30 tablet 11   No current facility-administered medications for this visit.   There were no vitals taken for this visit. Wt Readings from Last 3 Encounters:  11/16/22 128.8 kg (284 lb)  10/28/22 128.9 kg (284 lb 3.2 oz)  07/28/22 128.5 kg (283 lb 6.4 oz)   PHYSICAL EXAM: General: NAD Neck: No JVD, no thyromegaly or thyroid nodule.  Lungs: Clear to auscultation bilaterally with normal respiratory effort. CV: Nondisplaced PMI.  Heart regular S1/S2, no S3/S4, no murmur.  No peripheral edema.  No carotid bruit.  Normal pedal pulses.  Abdomen: Soft, nontender, no hepatosplenomegaly, no distention.  Skin: Intact without lesions or rashes.  Neurologic: Alert and oriented x 3.  Psych: Normal affect. Extremities: No clubbing or cyanosis.  HEENT: Normal.   Assessment/Plan: 1. Chronic systolic CHF: Cardiac MRI in 9/21 with EF 39% and evidence for noncompaction cardiomyopathy, no delayed enhancement, significant RV  dysfunction with RV EF 22%.  PYP scan was not suggestive of TTR amyloidosis and no symptoms or peripheral neuropathy.  12/21 LHC showed CAD but does not explain cardiomyopathy, no interventional target.  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  Echo 10/2021 showed  EF 35-40%, diffuse hypokinesis, mildly decreased RV systolic function, IVC normal. He has a history of orthostatic hypotension which has limited medication titration but this is doing better since AV nodal ablation with St Jude CRT-P.  Not volume overloaded by exam or Corvue.  95% BiV Pacing. Stable NYHA class II symptoms.  - Continue Lasix prn.  - He did not tolerate Jardiance or Farxiga due to dizziness.  - Increase eplerenone to 25 mg qhs.  BMET/BNP today and BMET 10 days.  - Continue losartan 12.5 mg at bed time due to hypotension. Suspect BP will not tolerate transition to Entresto.  - Unable to tolerate Coreg or Toprol XL due to lightheadedness/low BP.  - Continue Verquvo 2.5 mg daily, can increase in future.  - Not candidate for cardiac contractility modulation due to CRT.  - Arrange for repeat echo.  2. Atrial fibrillation: Permanent.  He has severe LAE. Not good candidate for atrial fibrillation ablation per EP.  He failed DCCV on amiodarone in 4/22, amiodarone stopped.  AV nodal ablation with St Jude CRT-P in 7/22. - Continue apixaban 5 mg bid.  3. OSA: Continue CPAP.  4. Orthostatic hypotension: Limits medications.   - Continue compression stockings. Encouraged slow positional changes.   5. CAD: Moderate disease on cath in 12/21, no interventional target.  No chest pain.  Extent of disease does not explain cardiomyopathy.   - He is on Crestor 5 mg qod and has just restarted Repatha.  Check lipids next visit when he has been on Repatha for some time.   - He is on apixaban with stable CAD, so no ASA.   F/u with HF pharmacist in 3 wks to try to titrate Verquvo.  See APP in 3 months.   Anderson Malta  Carter,  01/09/2023

## 2023-01-10 ENCOUNTER — Encounter (HOSPITAL_COMMUNITY): Payer: Self-pay

## 2023-01-10 ENCOUNTER — Ambulatory Visit (HOSPITAL_COMMUNITY)
Admission: RE | Admit: 2023-01-10 | Discharge: 2023-01-10 | Disposition: A | Discharge status: Home / Self Care | Payer: PPO | Source: Ambulatory Visit | Attending: Family Medicine | Admitting: Family Medicine

## 2023-01-10 VITALS — BP 90/50 | HR 70 | Wt 285.4 lb

## 2023-01-10 DIAGNOSIS — I5022 Chronic systolic (congestive) heart failure: Secondary | ICD-10-CM | POA: Insufficient documentation

## 2023-01-10 DIAGNOSIS — K219 Gastro-esophageal reflux disease without esophagitis: Secondary | ICD-10-CM | POA: Diagnosis not present

## 2023-01-10 DIAGNOSIS — I951 Orthostatic hypotension: Secondary | ICD-10-CM | POA: Insufficient documentation

## 2023-01-10 DIAGNOSIS — I4821 Permanent atrial fibrillation: Secondary | ICD-10-CM | POA: Diagnosis not present

## 2023-01-10 DIAGNOSIS — G4733 Obstructive sleep apnea (adult) (pediatric): Secondary | ICD-10-CM | POA: Insufficient documentation

## 2023-01-10 DIAGNOSIS — I2510 Atherosclerotic heart disease of native coronary artery without angina pectoris: Secondary | ICD-10-CM | POA: Diagnosis not present

## 2023-01-10 DIAGNOSIS — R972 Elevated prostate specific antigen [PSA]: Secondary | ICD-10-CM | POA: Diagnosis not present

## 2023-01-10 DIAGNOSIS — Z7901 Long term (current) use of anticoagulants: Secondary | ICD-10-CM | POA: Insufficient documentation

## 2023-01-10 LAB — LIPID PANEL
Cholesterol: 91 mg/dL (ref 0–200)
HDL: 45 mg/dL (ref >40)
LDL Cholesterol: 34 mg/dL (ref 0–99)
Total CHOL/HDL Ratio: 2 RATIO
Triglycerides: 58 mg/dL (ref <150)
VLDL: 12 mg/dL (ref 0–40)

## 2023-01-10 LAB — COMPREHENSIVE METABOLIC PANEL
ALT: 18 U/L (ref 0–44)
AST: 22 U/L (ref 15–41)
Albumin: 3.2 g/dL — ABNORMAL LOW (ref 3.5–5.0)
Alkaline Phosphatase: 61 U/L (ref 38–126)
Anion gap: 7 (ref 5–15)
BUN: 10 mg/dL (ref 8–23)
CO2: 24 mmol/L (ref 22–32)
Calcium: 8.6 mg/dL — ABNORMAL LOW (ref 8.9–10.3)
Chloride: 107 mmol/L (ref 98–111)
Creatinine, Ser: 0.98 mg/dL (ref 0.61–1.24)
GFR, Estimated: >60 mL/min (ref >60)
Glucose, Bld: 92 mg/dL (ref 70–99)
Potassium: 4.2 mmol/L (ref 3.5–5.1)
Sodium: 138 mmol/L (ref 135–145)
Total Bilirubin: 0.7 mg/dL (ref 0.3–1.2)
Total Protein: 6.3 g/dL — ABNORMAL LOW (ref 6.5–8.1)

## 2023-01-10 LAB — BRAIN NATRIURETIC PEPTIDE: B Natriuretic Peptide: 99 pg/mL (ref 0.0–100.0)

## 2023-01-10 MED ORDER — PANTOPRAZOLE SODIUM 40 MG PO TBEC: 40.0000 mg | DELAYED_RELEASE_TABLET | Freq: Every day | ORAL | 0 refills | Status: DC

## 2023-01-10 NOTE — Patient Instructions (Signed)
START Prantoprazole 40 mg daily.  Labs done today, your results will be available in MyChart, we will contact you for abnormal readings.  Your physician recommends that you schedule a follow-up appointment in: 4 months (December) ** PLEASE CALL THE OFFICE IN OCTOBER TO ARRANGE YOUR FOLLOW UP APPOINTMENT. **  If you have any questions or concerns before your next appointment please send [place] a message through [place] or call our office at [phone].    TO LEAVE A MESSAGE FOR THE NURSE SELECT OPTION 2, PLEASE LEAVE A MESSAGE INCLUDING: YOUR NAME DATE OF BIRTH CALL BACK NUMBER REASON FOR CALL**this is important as we prioritize the call backs  YOU WILL RECEIVE A CALL BACK THE SAME DAY AS LONG AS YOU CALL BEFORE 4:00 PM  At the [facility], you and your health needs are our priority. As part of our continuing mission to provide you with exceptional heart care, we have created designated Provider Care Teams. These Care Teams include your primary Cardiologist (physician) and Advanced Practice Providers (APPs- Physician Assistants and Nurse Practitioners) who all work together to provide you with the care you need, when you need it.   You may see any of the following providers on your designated Care Team at your next follow up: Dr Arvilla Meres Dr Marca Ancona Dr. Marcos Eke, NP Robbie Lis, [place] [facility] [place], [place] Brynda Peon, NP Karle Plumber, PharmD   Please be sure to bring in all your medications bottles to every appointment.    Thank you for choosing [facility]

## 2023-01-11 NOTE — Addendum Note (Signed)
Encounter addended by: Jacklynn Ganong, FNP on: 01/11/2023 8:11 AM  Actions taken: Visit diagnoses modified, Clinical Note Signed

## 2023-01-17 DIAGNOSIS — U071 COVID-19: Secondary | ICD-10-CM | POA: Diagnosis not present

## 2023-01-18 ENCOUNTER — Encounter: Payer: Self-pay | Admitting: Cardiology

## 2023-01-18 ENCOUNTER — Other Ambulatory Visit (HOSPITAL_COMMUNITY): Payer: Self-pay | Admitting: *Deleted

## 2023-01-18 MED ORDER — PANTOPRAZOLE SODIUM 40 MG PO TBEC: 40.0000 mg | DELAYED_RELEASE_TABLET | Freq: Every day | ORAL | 3 refills | Status: DC

## 2023-01-25 DIAGNOSIS — Z08 Encounter for follow-up examination after completed treatment for malignant neoplasm: Secondary | ICD-10-CM | POA: Diagnosis not present

## 2023-01-25 DIAGNOSIS — D225 Melanocytic nevi of trunk: Secondary | ICD-10-CM | POA: Diagnosis not present

## 2023-01-25 DIAGNOSIS — Z85828 Personal history of other malignant neoplasm of skin: Secondary | ICD-10-CM | POA: Diagnosis not present

## 2023-01-25 DIAGNOSIS — Z09 Encounter for follow-up examination after completed treatment for conditions other than malignant neoplasm: Secondary | ICD-10-CM | POA: Diagnosis not present

## 2023-01-25 DIAGNOSIS — R053 Chronic cough: Secondary | ICD-10-CM | POA: Diagnosis not present

## 2023-01-25 DIAGNOSIS — L821 Other seborrheic keratosis: Secondary | ICD-10-CM | POA: Diagnosis not present

## 2023-01-25 DIAGNOSIS — L570 Actinic keratosis: Secondary | ICD-10-CM | POA: Diagnosis not present

## 2023-01-25 DIAGNOSIS — L814 Other melanin hyperpigmentation: Secondary | ICD-10-CM | POA: Diagnosis not present

## 2023-01-25 DIAGNOSIS — L84 Corns and callosities: Secondary | ICD-10-CM | POA: Diagnosis not present

## 2023-01-25 DIAGNOSIS — L218 Other seborrheic dermatitis: Secondary | ICD-10-CM | POA: Diagnosis not present

## 2023-01-30 ENCOUNTER — Ambulatory Visit: Payer: PPO | Attending: Cardiology

## 2023-01-30 DIAGNOSIS — I5022 Chronic systolic (congestive) heart failure: Secondary | ICD-10-CM | POA: Diagnosis not present

## 2023-01-30 DIAGNOSIS — I5042 Chronic combined systolic (congestive) and diastolic (congestive) heart failure: Secondary | ICD-10-CM

## 2023-02-02 ENCOUNTER — Telehealth: Payer: Self-pay

## 2023-02-02 NOTE — Telephone Encounter (Signed)
Remote ICM transmission received.  Attempted call to patient regarding ICM remote transmission and left detailed message per DPR.  Left ICM phone number and advised to return call for any fluid symptoms or questions. Next ICM remote transmission scheduled 03/13/2023.

## 2023-02-02 NOTE — Progress Notes (Signed)
EPIC Encounter for ICM Monitoring  Patient Name: Joshua Moran. is a 77 y.o. male Date: 02/02/2023 Primary Care Physican: Daisy Floro, MD Primary Cardiologist: Shirlee Latch Electrophysiologist: Lalla Brothers BiV Pacing: 95% 07/06/2022 Weight: 276 lbs.  07/08/2022 Office Weight: 283 lbs 10/19/2022 Weight: 279 lbs (276 lbs) 11/25/2022 Weight: 281 lbs 12/28/2022 Weight: 281 lbs                                                            Attempted call to patient and unable to reach.  Left detailed message per DPR regarding transmission. Transmission reviewed.      CorVue thoracic impedance suggesting normal fluid levels with exception of possible fluid accumulation from 8/12-8/18 (correlates with vacation).   Prescribed:  Furosemide 20 mg take 1 tablet(s) (20 mg total) by mouth as directed (as needed).   Recommendations:  Left voice mail with ICM number and encouraged to call if experiencing any fluid symptoms.   Follow-up plan: ICM clinic phone appointment on 03/14/2023/2024.   91 day device clinic remote transmission 03/13/2023.     EP/Cardiology Office Visits:  Recall 05/10/2023 with Dr Shirlee Latch.  03/24/2023 with Otilio Saber, PA.    Copy of ICM check sent to Dr. Lalla Brothers.  3 month ICM trend: 01/30/2023.    12-14 Month ICM trend:     Karie Soda, RN 02/02/2023 8:54 AM

## 2023-02-09 DIAGNOSIS — E7800 Pure hypercholesterolemia, unspecified: Secondary | ICD-10-CM | POA: Diagnosis not present

## 2023-02-09 DIAGNOSIS — D649 Anemia, unspecified: Secondary | ICD-10-CM | POA: Diagnosis not present

## 2023-02-09 DIAGNOSIS — Z79899 Other long term (current) drug therapy: Secondary | ICD-10-CM | POA: Diagnosis not present

## 2023-02-16 DIAGNOSIS — I4891 Unspecified atrial fibrillation: Secondary | ICD-10-CM | POA: Diagnosis not present

## 2023-02-16 DIAGNOSIS — Z0000 Encounter for general adult medical examination without abnormal findings: Secondary | ICD-10-CM | POA: Diagnosis not present

## 2023-02-16 DIAGNOSIS — D6869 Other thrombophilia: Secondary | ICD-10-CM | POA: Diagnosis not present

## 2023-02-16 DIAGNOSIS — J309 Allergic rhinitis, unspecified: Secondary | ICD-10-CM | POA: Diagnosis not present

## 2023-02-16 DIAGNOSIS — Z6841 Body Mass Index (BMI) 40.0 and over, adult: Secondary | ICD-10-CM | POA: Diagnosis not present

## 2023-02-16 DIAGNOSIS — K219 Gastro-esophageal reflux disease without esophagitis: Secondary | ICD-10-CM | POA: Diagnosis not present

## 2023-02-28 DIAGNOSIS — J343 Hypertrophy of nasal turbinates: Secondary | ICD-10-CM | POA: Diagnosis not present

## 2023-02-28 DIAGNOSIS — J342 Deviated nasal septum: Secondary | ICD-10-CM | POA: Diagnosis not present

## 2023-02-28 DIAGNOSIS — J310 Chronic rhinitis: Secondary | ICD-10-CM | POA: Diagnosis not present

## 2023-03-08 DIAGNOSIS — Z03818 Encounter for observation for suspected exposure to other biological agents ruled out: Secondary | ICD-10-CM | POA: Diagnosis not present

## 2023-03-08 DIAGNOSIS — N401 Enlarged prostate with lower urinary tract symptoms: Secondary | ICD-10-CM | POA: Diagnosis not present

## 2023-03-08 DIAGNOSIS — R051 Acute cough: Secondary | ICD-10-CM | POA: Diagnosis not present

## 2023-03-08 DIAGNOSIS — R350 Frequency of micturition: Secondary | ICD-10-CM | POA: Diagnosis not present

## 2023-03-08 DIAGNOSIS — R972 Elevated prostate specific antigen [PSA]: Secondary | ICD-10-CM | POA: Diagnosis not present

## 2023-03-08 DIAGNOSIS — R351 Nocturia: Secondary | ICD-10-CM | POA: Diagnosis not present

## 2023-03-08 DIAGNOSIS — R059 Cough, unspecified: Secondary | ICD-10-CM | POA: Diagnosis not present

## 2023-03-08 DIAGNOSIS — R0609 Other forms of dyspnea: Secondary | ICD-10-CM | POA: Diagnosis not present

## 2023-03-13 ENCOUNTER — Ambulatory Visit (INDEPENDENT_AMBULATORY_CARE_PROVIDER_SITE_OTHER): Payer: PPO

## 2023-03-13 DIAGNOSIS — I5022 Chronic systolic (congestive) heart failure: Secondary | ICD-10-CM | POA: Diagnosis not present

## 2023-03-13 DIAGNOSIS — I428 Other cardiomyopathies: Secondary | ICD-10-CM

## 2023-03-13 LAB — CUP PACEART REMOTE DEVICE CHECK
Battery Remaining Longevity: 76 mo
Battery Remaining Percentage: 77 %
Battery Voltage: 3.01 V
Date Time Interrogation Session: 20241007020010
Implantable Lead Connection Status: 753985
Implantable Lead Connection Status: 753985
Implantable Lead Implant Date: 20220708
Implantable Lead Implant Date: 20220708
Implantable Lead Location: 753858
Implantable Lead Location: 753860
Implantable Pulse Generator Implant Date: 20220708
Lead Channel Impedance Value: 1100 Ohm
Lead Channel Impedance Value: 580 Ohm
Lead Channel Pacing Threshold Amplitude: 0.5 V
Lead Channel Pacing Threshold Amplitude: 0.75 V
Lead Channel Pacing Threshold Pulse Width: 0.5 ms
Lead Channel Pacing Threshold Pulse Width: 0.5 ms
Lead Channel Sensing Intrinsic Amplitude: 6.8 mV
Lead Channel Setting Pacing Amplitude: 2.5 V
Lead Channel Setting Pacing Amplitude: 2.5 V
Lead Channel Setting Pacing Pulse Width: 0.5 ms
Lead Channel Setting Pacing Pulse Width: 0.5 ms
Lead Channel Setting Sensing Sensitivity: 4 mV
Pulse Gen Model: 3562
Pulse Gen Serial Number: 3913632

## 2023-03-14 ENCOUNTER — Ambulatory Visit: Payer: PPO | Attending: Cardiology

## 2023-03-14 DIAGNOSIS — I5022 Chronic systolic (congestive) heart failure: Secondary | ICD-10-CM

## 2023-03-14 DIAGNOSIS — I5042 Chronic combined systolic (congestive) and diastolic (congestive) heart failure: Secondary | ICD-10-CM

## 2023-03-15 NOTE — Progress Notes (Signed)
EPIC Encounter for ICM Monitoring  Patient Name: Joshua Moran. is a 77 y.o. male Date: 03/15/2023 Primary Care Physican: Daisy Floro, MD Primary Cardiologist: Shirlee Latch Electrophysiologist: Lalla Brothers BiV Pacing: 95% 10/19/2022 Weight: 279 lbs (276 lbs) 11/25/2022 Weight: 281 lbs 12/28/2022 Weight: 281 lbs 03/15/2023 Weight: 278 lbs                                                            Spoke with patient and heart failure questions reviewed.  Transmission results reviewed.  Pt asymptomatic for fluid accumulation.  Reports feeling well at this time and voices no complaints.  He took PRN Lasix during decreased impedance due to weight gain but is back at baseline.   CorVue thoracic impedance suggesting normal fluid levels with exception of possible fluid accumulation from 10/2-10/7.   Prescribed:  Furosemide 20 mg take 1 tablet(s) (20 mg total) by mouth as directed (as needed).   Recommendations:  Discussed diet and fluid intake.  Encouraged to continue PRN Lasix for fluid symptoms.    Follow-up plan: ICM clinic phone appointment on 04/17/2023.   91 day device clinic remote transmission 06/12/2023.     EP/Cardiology Office Visits:  Recall 05/10/2023 with Dr Shirlee Latch.  03/24/2023 with Otilio Saber, PA.    Copy of ICM check sent to Dr. Lalla Brothers.  3 month ICM trend: 03/14/2023.    12-14 Month ICM trend:     Karie Soda, RN 03/15/2023 12:58 PM

## 2023-03-16 DIAGNOSIS — Z23 Encounter for immunization: Secondary | ICD-10-CM | POA: Diagnosis not present

## 2023-03-24 ENCOUNTER — Ambulatory Visit: Payer: PPO | Attending: Student | Admitting: Student

## 2023-03-24 ENCOUNTER — Encounter: Payer: Self-pay | Admitting: Student

## 2023-03-24 VITALS — BP 96/60 | HR 70 | Ht 72.0 in | Wt 278.0 lb

## 2023-03-24 DIAGNOSIS — E66812 Obesity, class 2: Secondary | ICD-10-CM

## 2023-03-24 DIAGNOSIS — G4733 Obstructive sleep apnea (adult) (pediatric): Secondary | ICD-10-CM | POA: Diagnosis not present

## 2023-03-24 DIAGNOSIS — I4821 Permanent atrial fibrillation: Secondary | ICD-10-CM

## 2023-03-24 DIAGNOSIS — I5022 Chronic systolic (congestive) heart failure: Secondary | ICD-10-CM | POA: Diagnosis not present

## 2023-03-24 LAB — CUP PACEART INCLINIC DEVICE CHECK
Battery Remaining Longevity: 73 mo
Battery Voltage: 3.01 V
Brady Statistic RA Percent Paced: 0 %
Brady Statistic RV Percent Paced: 95 %
Date Time Interrogation Session: 20241018104844
Implantable Lead Connection Status: 753985
Implantable Lead Connection Status: 753985
Implantable Lead Implant Date: 20220708
Implantable Lead Implant Date: 20220708
Implantable Lead Location: 753858
Implantable Lead Location: 753860
Implantable Pulse Generator Implant Date: 20220708
Lead Channel Impedance Value: 1212.5 Ohm
Lead Channel Impedance Value: 612.5 Ohm
Lead Channel Pacing Threshold Amplitude: 0.75 V
Lead Channel Pacing Threshold Amplitude: 0.75 V
Lead Channel Pacing Threshold Amplitude: 1 V
Lead Channel Pacing Threshold Amplitude: 1 V
Lead Channel Pacing Threshold Pulse Width: 0.5 ms
Lead Channel Pacing Threshold Pulse Width: 0.5 ms
Lead Channel Pacing Threshold Pulse Width: 0.5 ms
Lead Channel Pacing Threshold Pulse Width: 0.5 ms
Lead Channel Sensing Intrinsic Amplitude: 6.6 mV
Lead Channel Setting Pacing Amplitude: 2.5 V
Lead Channel Setting Pacing Amplitude: 2.5 V
Lead Channel Setting Pacing Pulse Width: 0.5 ms
Lead Channel Setting Pacing Pulse Width: 0.5 ms
Lead Channel Setting Sensing Sensitivity: 4 mV
Pulse Gen Model: 3562
Pulse Gen Serial Number: 3913632

## 2023-03-24 NOTE — Progress Notes (Signed)
Electrophysiology Office Note:   ID:  Joshua Piech., DOB 12-Apr-1946, MRN [id number]  Primary Cardiologist: Armanda Magic, MD Electrophysiologist: Lanier Prude, MD      History of Present Illness:   Joshua Desautel. is a 77 y.o. male with h/o chronic systolic CHF, permanent AF, s/p AV nodal ablation and BIV PPM, CAD, GERD, and OSA on CPAP seen today for routine electrophysiology followup.   Since last being seen in our clinic the patient reports doing OK overall. Has mild lightheadedness, mostly in the morning when he is getting up out of bed. Takes all of his BP active meds at bedtime. Mild SOB with moderate exertion. He denies chest pain, palpitations, PND, orthopnea, nausea, vomiting, dizziness, syncope, edema, weight gain, or early satiety.   Review of systems complete and found to be negative unless listed in HPI.   EP Information / Studies Reviewed:    EKG is not ordered today. EKG from 10/28/2022 reviewed which showed V paced 71 bpm, underlying AF       PPM Interrogation-  reviewed in detail today,  See PACEART report.  Device History: St. Jude BiV PPM implanted 12/11/20 for NICM and permanent uncontrolled AF   Physical Exam:   VS:  There were no vitals taken for this visit.   Wt Readings from Last 3 Encounters:  01/10/23 285 lb 6.4 oz (129.5 kg)  11/16/22 284 lb (128.8 kg)  10/28/22 284 lb 3.2 oz (128.9 kg)     GEN: Well nourished, well developed in no acute distress NECK: No JVD; No carotid bruits CARDIAC: Regular rate and rhythm, no murmurs, rubs, gallops RESPIRATORY:  Clear to auscultation without rales, wheezing or rhonchi  ABDOMEN: Soft, non-tender, non-distended EXTREMITIES:  No edema; No deformity   ASSESSMENT AND PLAN:    NICM  s/p Abbott BiV PPM  Left bundle branch block Normal PPM function See Pace Art report No changes today  Permanent AF S/p AV nodal ablation 7/8/202 Continue eliquis 5 mg BID for [id number]  of at least 6  Obesity There is  no height or weight on file to calculate BMI.  Encouraged lifestyle modification   OSA  Encouraged nightly CPAP   Disposition:   Follow up with Dr. Lalla Brothers in 12 months  Signed, Graciella Freer, PA-C

## 2023-03-24 NOTE — Patient Instructions (Signed)
Medication Instructions:  Your physician recommends that you continue on your current medications as directed. Please refer to the Current Medication list given to you today.  *If you need a refill on your cardiac medications before your next appointment, please call your pharmacy*  Lab Work: None ordered If you have labs (blood work) drawn today and your tests are completely normal, you will receive your results only by: MyChart Message (if you have MyChart) OR A paper copy in the mail If you have any lab test that is abnormal or we need to change your treatment, we will call you to review the results.  Follow-Up: At [facility], you and your health needs are our priority.  As part of our continuing mission to provide you with exceptional heart care, we have created designated Provider Care Teams.  These Care Teams include your primary Cardiologist (physician) and Advanced Practice Providers (APPs -  Physician Assistants and Nurse Practitioners) who all work together to provide you with the care you need, when you need it.  Your next appointment:   1 year(s)  Provider:   Steffanie Dunn, MD or Doreatha Martin, PA-C

## 2023-03-27 NOTE — Progress Notes (Signed)
Remote pacemaker transmission.   

## 2023-03-28 ENCOUNTER — Telehealth (HOSPITAL_COMMUNITY): Payer: Self-pay | Admitting: Pharmacy Technician

## 2023-03-28 NOTE — Telephone Encounter (Addendum)
Advanced Heart Failure Patient Advocate Encounter  The patient was approved for a [employer] grant that will help cover the cost of Verquvo, Inspra and Losartan. Total amount awarded, $10,000. Eligibility, 04/13/23 - 04/11/24.  ID [id number]  BIN [id number]  PCN [id number]  Group [id number]  Sent information via mychart.  Archer Asa, CPhT

## 2023-04-03 ENCOUNTER — Encounter (HOSPITAL_COMMUNITY): Payer: Self-pay

## 2023-04-17 ENCOUNTER — Ambulatory Visit: Payer: PPO | Attending: Cardiology

## 2023-04-17 DIAGNOSIS — Z950 Presence of cardiac pacemaker: Secondary | ICD-10-CM

## 2023-04-17 DIAGNOSIS — I5022 Chronic systolic (congestive) heart failure: Secondary | ICD-10-CM | POA: Diagnosis not present

## 2023-04-21 NOTE — Progress Notes (Signed)
EPIC Encounter for ICM Monitoring  Patient Name: Joshua Moran. is a 77 y.o. male Date: 04/21/2023 Primary Care Physican: Daisy Floro, MD Primary Cardiologist: Shirlee Latch Electrophysiologist: Lalla Brothers BiV Pacing: 97% 10/19/2022 Weight: 279 lbs (276 lbs) 11/25/2022 Weight: 281 lbs 12/28/2022 Weight: 281 lbs 03/15/2023 Weight: 278 lbs 04/21/2023 Weight: 278 lbs (fluctuates a couple of pounds)                                                            Spoke with patient and heart failure questions reviewed.  Transmission results reviewed.  Pt asymptomatic for fluid accumulation.   He took Lasix twice within the last month.   CorVue thoracic impedance suggesting normal fluid levels with exception of possible fluid accumulation from 10/26-11/4.   Prescribed:  Furosemide 20 mg take 1 tablet(s) (20 mg total) by mouth as directed (as needed).     Recommendations:  No changes and encouraged to call if experiencing any fluid symptoms.   Follow-up plan: ICM clinic phone appointment on 06/05/2023.   91 day device clinic remote transmission 06/12/2023.     EP/Cardiology Office Visits:  05/10/2023 with HF clinic.  Recall 04/18/2024 with Dr Lalla Brothers or Otilio Saber, PA.    Copy of ICM check sent to Dr. Lalla Brothers.  3 month ICM trend: 04/17/2023.    12-14 Month ICM trend:     Karie Soda, RN 04/21/2023 9:19 AM

## 2023-04-27 DIAGNOSIS — Z961 Presence of intraocular lens: Secondary | ICD-10-CM | POA: Diagnosis not present

## 2023-04-27 DIAGNOSIS — H33033 Retinal detachment with giant retinal tear, bilateral: Secondary | ICD-10-CM | POA: Diagnosis not present

## 2023-04-27 DIAGNOSIS — H401114 Primary open-angle glaucoma, right eye, indeterminate stage: Secondary | ICD-10-CM | POA: Diagnosis not present

## 2023-05-09 NOTE — Progress Notes (Signed)
[facility] Progress Note    PCP: Daisy Floro, MD Cardiology: Dr. Mayford Knife HF Cardiology: Dr. Shirlee Latch  77 y.o. with history of chronic systolic CHF and paroxysmal atrial fibrillation was referred by Dr. Mayford Knife for evaluation of CHF.  Patient has been short of breath for years, worse over the last 6 months.  He has had an extensive workup.  Echo in 3/21 showed EF 45-50% with mildly decreased RV function.  Cardiolite in 4/21 showed fixed apical defect.  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  PYP scan in 8/21 was not suggestive of TTR cardiac amyloidosis.   He had been in persistent atrial fibrillation for about 2 years.  He saw Dr. Lalla Brothers and was started on amiodarone.  DCCV was done in 10/21 back to NSR.  He was found to have M-spike on SPEP.  He saw Dr. Myna Hidalgo who thought this was MGUS.   LHC/RHC in 12/21 showed occluded small OM2 and 80-90% stenosis mid PLOM (small caliber), no interventional target; normal filling pressures and preserved cardiac output.   He was unable to tolerate Jardiance due to extreme dizziness. He is off both Coreg and Toprol XL due to lightheadedness/hypotension.   He developed recurrent atrial fibrillation and failed DCCV in 4/22.  He has been in persistent atrial fibrillation, Dr Lalla Brothers stopped his amiodarone due to futility.  In 7/22, he had AV nodal ablation with St Jude CRT-P device placement.   Echo in 5/22 showed EF 35-40%, mild LV enlargement, normal RV.  Echo in 5/23 showed EF 35-40%, diffuse hypokinesis. RV mildly reduced.   He has had issues recently tolerating GDMT due to low BP.   Echo (6/24) showed EF 50-55%, normal RV (poor images).  Today he returns for HF follow up with his wife. Overall feeling fine. Has "good days and bad days", has SOB walking up inclines or steps but does OK walking on flat ground and with ADLs. Able to work in  his yard. He is chronically dizzy. Denies palpitations, CP, edema, or PND/Orthopnea. Appetite ok. No fever or chills. Weight at home 278 pounds. Taking all medications. Took lasix recently after Thanksgiving with good results. Wears CPAP ~3-4x/week.  St Jude device interrogation: 99% BiV paced, stable thoracic impedance, 2 hr/day activity.    ECG (personally reviewed): none ordered today.  Labs (7/21): Urine immunofixation with monoclonal IgG protein, SPEP with M-spike.   Labs (10/21): TSH normal, K 4, creatinine 0.97, LFTs normal Labs (11/21): K 4.8, creatinine 1.07, hgb 14.7, LFTs normal, TSH normal Labs (12/21): LDL 66, HDL 45 Labs (1/22): K 3.9, creatinine 1.07 Labs (4/22): K 4.5, creatinine 1.14, TSH normal, hgb 13.8 Labs (7/22): K 4.5, creatinine 1.0 Labs (8/22): LDL 57 Labs (10/22): K 4.5, creatinine 1.07 Labs (3/23): K 3.8, creatinine 0.99 Labs (8/23): creatinine 0.77, LDL 65 Labs (2/24): K 3.9, creatinine 0.93, LFTs normal, LDL 86 Labs (6/24): K 4.1, creatinine 0.95 Labs (8/24): K 4.2, creatinine 0.98, LDL 34  PMH: 1. CVA: Occurred in 3/21, he was not on apixaban at the time.  2. GERD 3. OSA: Uses CPAP.  4. Hyperlipidemia 5. Atrial fibrillation: Permanent - DCCV to NSR in 10/21.  - Failed DCCV in 4/22, stopped amiodarone.  - AV nodal ablation with St Jude CRT-P in 7/22.  6. MGUS 7. Chronic systolic CHF: Echo (3/21) with EF 45-50%, mild LVH, mildly decreased RV systolic function. Primarily nonischemic cardiomyopathy.  -  Cardiolite (4/21): Fixed apical defect.  - Cardiac MRI (9/21): Mild LV dilation with mild LV hypertrophy, EF 39%, prominent trabeculations concerning for noncompaction. Mild RV dilation with EF 28%, severe LAE.  No delayed enhancement.  - PYP scan (8/21): grade 1, H/CL 1-1.5.  Unlikely to have cardiac amyloidosis.  - LHC/RHC (12/21): small OM2 totally occluded, 80-90% mid small PLOM (no interventional target); mean RA 5, PA 25/9, mean PCWP 5, CI 2.77  Fick/3.29 Thermo.  - St Jude CRT-P  - Echo (5/23): EF 35-40%, diffuse hypokinesis, mildly decreased RV systolic function, IVC normal.  - Echo (6/24): EF 50-55%, normal RV (poor images). 8. Orthostatic hypotension.  9. CAD: Coronary angiography 12/21 with small OM2 totally occluded, 80-90% mid small PLOM (no interventional target) 10. Carotid ultrasound (8/22): mild BICA stenosis.  11. COVID-19 9/22 12. PVCs  Social History   Socioeconomic History   Marital status: Married    Spouse name: Not on file   Number of children: Not on file   Years of education: Not on file   Highest education level: Not on file  Occupational History   Not on file  Tobacco Use   Smoking status: Never   Smokeless tobacco: Never  Vaping Use   Vaping status: Never Used  Substance and Sexual Activity   Alcohol use: Yes    Alcohol/week: 1.0 standard drink of alcohol    Types: 1 Cans of beer per week    Comment: Rare   Drug use: No   Sexual activity: Not Currently  Other Topics Concern   Not on file  Social History Narrative   Not on file   Social Determinants of Health   Financial Resource Strain: Not on file  Food Insecurity: Not on file  Transportation Needs: Not on file  Physical Activity: Not on file  Stress: Not on file  Social Connections: Not on file  Intimate Partner Violence: Not on file   Family History  Problem Relation Age of Onset   Lung cancer Mother        smoker   ROS: All systems reviewed and negative except as per HPI.   Current Outpatient Medications  Medication Sig Dispense Refill   acetaminophen (TYLENOL) 500 MG tablet Take 1,000 mg by mouth every 6 (six) hours as needed (PAIN).     apixaban (ELIQUIS) 5 MG TABS tablet Take 1 tablet (5 mg total) by mouth 2 (two) times daily. 60 tablet 0   cetirizine (ZYRTEC) 10 MG tablet Take 10 mg by mouth at bedtime.     Cholecalciferol (VITAMIN D3) 50 MCG (2000 UT) TABS Take 2,000 Units by mouth daily.     clindamycin (CLEOCIN)  150 MG capsule SMARTSIG:4 Capsule(s) By Mouth Once     eplerenone (INSPRA) 25 MG tablet Take 1 tablet (25 mg total) by mouth daily. 90 tablet 3   Evolocumab (REPATHA SURECLICK) 140 MG/ML SOAJ Inject 140 mg into the skin every 14 (fourteen) days. 6 mL 3   ezetimibe (ZETIA) 10 MG tablet TAKE ONE TABLET BY MOUTH ONCE DAILY 90 tablet 0   ferrous sulfate 325 (65 FE) MG tablet Take 325 mg by mouth in the morning.     fluticasone (FLONASE) 50 MCG/ACT nasal spray Place 2 sprays into both nostrils 2 (two) times daily.      furosemide (LASIX) 20 MG tablet Take 1 tablet (20 mg total) by mouth daily as needed for fluid or edema. 30 tablet 6   Glucosamine Sulfate 750 MG TABS Take 750 mg  by mouth in the morning and at bedtime.     ipratropium (ATROVENT) 0.06 % nasal spray SMARTSIG:2 Spray(s) Both Nares Twice Daily PRN     losartan (COZAAR) 25 MG tablet Take 0.5 tablets (12.5 mg total) by mouth at bedtime. 45 tablet 3   Melatonin 10 MG TABS Take 10 mg by mouth at bedtime.      Multiple Vitamin (MULTIVITAMIN WITH MINERALS) TABS tablet Take 1 tablet by mouth every evening.     pantoprazole (PROTONIX) 40 MG tablet Take 1 tablet (40 mg total) by mouth daily. 90 tablet 3   rosuvastatin (CRESTOR) 5 MG tablet 1 tablet Orally MWF for 90 days     sodium chloride (OCEAN) 0.65 % SOLN nasal spray Place 1 spray into both nostrils as needed for congestion. 22.5 mL 0   tamsulosin (FLOMAX) 0.4 MG CAPS capsule Take 0.4 mg by mouth at bedtime.      Vericiguat (VERQUVO) 5 MG TABS Take 1 tablet (5 mg total) by mouth daily. 30 tablet 11   No current facility-administered medications for this encounter.   BP 111/69   Pulse 78   Wt 129.5 kg (285 lb 6.4 oz)   SpO2 98%   BMI 38.71 kg/m   Wt Readings from Last 3 Encounters:  05/10/23 129.5 kg (285 lb 6.4 oz)  03/24/23 126.1 kg (278 lb)  01/10/23 129.5 kg (285 lb 6.4 oz)   PHYSICAL EXAM: General:  NAD. No resp difficulty, walked into clinic HEENT: Normal Neck: Supple. No  JVD. Carotids 2+ bilat; no bruits. No lymphadenopathy or thryomegaly appreciated. Cor: PMI nondisplaced. Regular rate & rhythm. No rubs, gallops or murmurs. Lungs: Clear Abdomen: Soft, obese, nontender, nondistended. No hepatosplenomegaly. No bruits or masses. Good bowel sounds. Extremities: No cyanosis, clubbing, rash, edema Neuro: Alert & oriented x 3, cranial nerves grossly intact. Moves all 4 extremities w/o difficulty. Affect pleasant.  Assessment/Plan: 1. Chronic systolic CHF: Cardiac MRI in 9/21 with EF 39% and evidence for noncompaction cardiomyopathy, no delayed enhancement, significant RV dysfunction with RV EF 22%.  PYP scan was not suggestive of TTR amyloidosis and no symptoms or peripheral neuropathy.  12/21 LHC showed CAD but does not explain cardiomyopathy, no interventional target.  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  Echo 10/2021 showed  EF 35-40%, diffuse hypokinesis, mildly decreased RV systolic function, IVC normal. He has a history of orthostatic hypotension which has limited medication titration but this is doing better since AV nodal ablation with St Jude CRT-P.  Echo (6/24) showed EF 50-55%, normal RV. Stable NYHA class II-IIb symptoms. Not volume overloaded by exam or Corvue.  99% BiV Pacing on device interrogation today. - Continue Lasix 20 prn.  - Continue Verquvo 5 mg daily. If BP drops further, would decrease to 2.5 or stop.  - Continue eplerenone 25 mg qhs.  BMET today. - Continue losartan 12.5 mg at bed time due to hypotension. Suspect BP will not tolerate transition to Entresto.  - Unable to tolerate Coreg or Toprol XL due to lightheadedness/low BP.  - He did not tolerate Jardiance or Farxiga due to dizziness.  - Not candidate for cardiac contractility modulation due to CRT.  - Update echo at next visit. 2. Atrial fibrillation: Permanent.  He has severe LAE. Not good candidate for atrial fibrillation ablation per EP.  He failed DCCV on  amiodarone in 4/22, amiodarone stopped.  AV nodal ablation with St Jude CRT-P in 7/22. - Continue apixaban 5 mg bid. No bleeding issues.  CBC today. 3. OSA: Continue CPAP.  4. Orthostatic hypotension: Limits medications.   - Continue compression stockings.  - Encouraged slow positional changes.   5. CAD: Moderate disease on cath in 12/21, no interventional target.  No chest pain.  Extent of disease does not explain cardiomyopathy.   - He is on Crestor 5 mg qod and is now back on Repatha.  Good lipids 8/24. - He is on apixaban with stable CAD, so no ASA.   Doing well! Follow up in 6 months with Dr. Shirlee Latch + echo  Anderson [place], [place] 05/10/2023

## 2023-05-10 ENCOUNTER — Ambulatory Visit (HOSPITAL_COMMUNITY)
Admission: RE | Admit: 2023-05-10 | Discharge: 2023-05-10 | Disposition: A | Discharge status: Home / Self Care | Payer: PPO | Source: Ambulatory Visit | Attending: Family Medicine | Admitting: Family Medicine

## 2023-05-10 ENCOUNTER — Encounter (HOSPITAL_COMMUNITY): Payer: Self-pay

## 2023-05-10 VITALS — BP 111/69 | HR 78 | Wt 285.4 lb

## 2023-05-10 DIAGNOSIS — I2510 Atherosclerotic heart disease of native coronary artery without angina pectoris: Secondary | ICD-10-CM | POA: Diagnosis not present

## 2023-05-10 DIAGNOSIS — I4821 Permanent atrial fibrillation: Secondary | ICD-10-CM | POA: Diagnosis not present

## 2023-05-10 DIAGNOSIS — Z79899 Other long term (current) drug therapy: Secondary | ICD-10-CM | POA: Diagnosis not present

## 2023-05-10 DIAGNOSIS — G4733 Obstructive sleep apnea (adult) (pediatric): Secondary | ICD-10-CM | POA: Insufficient documentation

## 2023-05-10 DIAGNOSIS — I424 Endocardial fibroelastosis: Secondary | ICD-10-CM | POA: Insufficient documentation

## 2023-05-10 DIAGNOSIS — I951 Orthostatic hypotension: Secondary | ICD-10-CM | POA: Insufficient documentation

## 2023-05-10 DIAGNOSIS — I5022 Chronic systolic (congestive) heart failure: Secondary | ICD-10-CM | POA: Diagnosis not present

## 2023-05-10 DIAGNOSIS — Z7901 Long term (current) use of anticoagulants: Secondary | ICD-10-CM | POA: Diagnosis not present

## 2023-05-10 LAB — CBC
HCT: 41.3 % (ref 39.0–52.0)
Hemoglobin: 13.2 g/dL (ref 13.0–17.0)
MCH: 29.9 pg (ref 26.0–34.0)
MCHC: 32 g/dL (ref 30.0–36.0)
MCV: 93.4 fL (ref 80.0–100.0)
Platelets: 227 10*3/uL (ref 150–400)
RBC: 4.42 MIL/uL (ref 4.22–5.81)
RDW: 13.7 % (ref 11.5–15.5)
WBC: 5.8 10*3/uL (ref 4.0–10.5)
nRBC: 0 % (ref 0.0–0.2)

## 2023-05-10 LAB — BASIC METABOLIC PANEL
Anion gap: 7 (ref 5–15)
BUN: 11 mg/dL (ref 8–23)
CO2: 24 mmol/L (ref 22–32)
Calcium: 8.8 mg/dL — ABNORMAL LOW (ref 8.9–10.3)
Chloride: 107 mmol/L (ref 98–111)
Creatinine, Ser: 0.89 mg/dL (ref 0.61–1.24)
GFR, Estimated: >60 mL/min (ref >60)
Glucose, Bld: 97 mg/dL (ref 70–99)
Potassium: 4.3 mmol/L (ref 3.5–5.1)
Sodium: 138 mmol/L (ref 135–145)

## 2023-05-10 NOTE — Patient Instructions (Signed)
Medication Changes:  No Changes In Medications at this time.   Lab Work: Labs done today, your results will be available in MyChart, we will contact you for abnormal readings.  Follow-Up in: 6 MONTHS WITH DR. Shirlee Latch WITH AN ECHO PLEASE CALL OUR OFFICE AROUND APRIL 2025 TO GET SCHEDULED FOR YOUR APPOINTMENT. PHONE NUMBER IS [phone] OPTION 2    At the [facility], you and your health needs are our priority. We have a designated team specialized in the treatment of Heart Failure. This Care Team includes your primary Heart Failure Specialized Cardiologist (physician), Advanced Practice Providers (APPs- Physician Assistants and Nurse Practitioners), and Pharmacist who all work together to provide you with the care you need, when you need it.   You may see any of the following providers on your designated Care Team at your next follow up:  Dr. Arvilla Meres Dr. Marca Ancona Dr. Dorthula Nettles Dr. Theresia Bough Tonye Becket, NP Robbie Lis, [place] [facility] [place], [place] Brynda Peon, NP [place] Lee, NP Karle Plumber, PharmD   Please be sure to bring in all your medications bottles to every appointment.   Need to Contact us:  If you have any questions or concerns before your next appointment please send [place] a message through [place] or call our office at [phone].    TO LEAVE A MESSAGE FOR THE NURSE SELECT OPTION 2, PLEASE LEAVE A MESSAGE INCLUDING: YOUR NAME DATE OF BIRTH CALL BACK NUMBER REASON FOR CALL**this is important as we prioritize the call backs  YOU WILL RECEIVE A CALL BACK THE SAME DAY AS LONG AS YOU CALL BEFORE 4:00 PM

## 2023-05-21 ENCOUNTER — Other Ambulatory Visit: Payer: Self-pay | Admitting: Cardiology

## 2023-05-22 ENCOUNTER — Other Ambulatory Visit: Payer: Self-pay

## 2023-05-22 ENCOUNTER — Telehealth: Payer: Self-pay | Admitting: Cardiology

## 2023-05-22 MED ORDER — EZETIMIBE 10 MG PO TABS: 10.0000 mg | ORAL_TABLET | Freq: Every day | ORAL | 2 refills | Status: DC

## 2023-05-22 MED ORDER — EZETIMIBE 10 MG PO TABS: 10.0000 mg | ORAL_TABLET | Freq: Every day | ORAL | 0 refills | Status: DC

## 2023-05-22 NOTE — Addendum Note (Signed)
Addended by: Adriana Simas, Dalylah Ramey L on: 05/22/2023 01:32 PM   Modules accepted: Orders

## 2023-05-22 NOTE — Telephone Encounter (Signed)
*  STAT* If patient is at the pharmacy, call can be transferred to refill team.   1. Which medications need to be refilled? (please list name of each medication and dose if known)   ezetimibe (ZETIA) 10 MG tablet   2. Would you like to learn more about the convenience, safety, & potential cost savings by using the [facility]?   3. Are you open to using the [facility] (Type [facility]. ).  4. Which pharmacy/location (including street and city if local pharmacy) is medication to be sent to?  [id number] - [place], [place] - [address]   5. Do they need a 30 day or 90 day supply?   90 day  Patient is almost out of this medication.

## 2023-05-29 ENCOUNTER — Other Ambulatory Visit: Payer: Self-pay

## 2023-05-29 ENCOUNTER — Encounter: Payer: Self-pay | Admitting: Cardiology

## 2023-05-29 MED ORDER — FUROSEMIDE 20 MG PO TABS: 20.0000 mg | ORAL_TABLET | Freq: Every day | ORAL | 2 refills | Status: DC | PRN

## 2023-06-05 ENCOUNTER — Ambulatory Visit: Payer: PPO | Attending: Cardiology

## 2023-06-05 DIAGNOSIS — Z950 Presence of cardiac pacemaker: Secondary | ICD-10-CM

## 2023-06-05 DIAGNOSIS — I5022 Chronic systolic (congestive) heart failure: Secondary | ICD-10-CM

## 2023-06-12 ENCOUNTER — Ambulatory Visit (INDEPENDENT_AMBULATORY_CARE_PROVIDER_SITE_OTHER): Payer: PPO

## 2023-06-12 DIAGNOSIS — I428 Other cardiomyopathies: Secondary | ICD-10-CM

## 2023-06-12 DIAGNOSIS — I4821 Permanent atrial fibrillation: Secondary | ICD-10-CM

## 2023-06-13 LAB — CUP PACEART REMOTE DEVICE CHECK
Battery Remaining Longevity: 74 mo
Battery Remaining Percentage: 74 %
Battery Voltage: 3.01 V
Date Time Interrogation Session: 20250106020009
Implantable Lead Connection Status: 753985
Implantable Lead Connection Status: 753985
Implantable Lead Implant Date: 20220708
Implantable Lead Implant Date: 20220708
Implantable Lead Location: 753858
Implantable Lead Location: 753860
Implantable Pulse Generator Implant Date: 20220708
Lead Channel Impedance Value: 1200 Ohm
Lead Channel Impedance Value: 580 Ohm
Lead Channel Pacing Threshold Amplitude: 0.75 V
Lead Channel Pacing Threshold Amplitude: 1 V
Lead Channel Pacing Threshold Pulse Width: 0.5 ms
Lead Channel Pacing Threshold Pulse Width: 0.5 ms
Lead Channel Sensing Intrinsic Amplitude: 7.8 mV
Lead Channel Setting Pacing Amplitude: 2.5 V
Lead Channel Setting Pacing Amplitude: 2.5 V
Lead Channel Setting Pacing Pulse Width: 0.5 ms
Lead Channel Setting Pacing Pulse Width: 0.5 ms
Lead Channel Setting Sensing Sensitivity: 4 mV
Pulse Gen Model: 3562
Pulse Gen Serial Number: 3913632

## 2023-06-21 ENCOUNTER — Other Ambulatory Visit (HOSPITAL_COMMUNITY): Payer: Self-pay

## 2023-06-21 ENCOUNTER — Telehealth (HOSPITAL_COMMUNITY): Payer: Self-pay | Admitting: Pharmacy Technician

## 2023-06-21 NOTE — Telephone Encounter (Signed)
 Patient Advocate Encounter   Received notification from [facility] [employer] that prior authorization for Verquvo  is required.   PA submitted on CoverMyMeds Key [id number] Status is pending   Will continue to follow.

## 2023-06-22 NOTE — Telephone Encounter (Signed)
Advanced Heart Failure Patient Advocate Encounter  Prior Authorization for Aileen Pilot has been approved.    PA# [id number] Effective dates: 06/22/23 through 06/21/24  Archer Asa, CPhT

## 2023-07-10 ENCOUNTER — Ambulatory Visit: Payer: PPO | Attending: Cardiology

## 2023-07-10 DIAGNOSIS — Z950 Presence of cardiac pacemaker: Secondary | ICD-10-CM

## 2023-07-10 DIAGNOSIS — I5022 Chronic systolic (congestive) heart failure: Secondary | ICD-10-CM

## 2023-07-17 ENCOUNTER — Other Ambulatory Visit (HOSPITAL_COMMUNITY): Payer: Self-pay | Admitting: Cardiology

## 2023-07-17 MED ORDER — LOSARTAN POTASSIUM 25 MG PO TABS: 12.5000 mg | ORAL_TABLET | Freq: Every day | ORAL | 3 refills | Status: DC

## 2023-07-19 ENCOUNTER — Other Ambulatory Visit (HOSPITAL_COMMUNITY): Payer: Self-pay | Admitting: *Deleted

## 2023-07-19 DIAGNOSIS — I5022 Chronic systolic (congestive) heart failure: Secondary | ICD-10-CM

## 2023-07-19 MED ORDER — LOSARTAN POTASSIUM 25 MG PO TABS
12.5000 mg | ORAL_TABLET | Freq: Every day | ORAL | 3 refills | Status: DC
Start: 2023-07-19 — End: 2024-01-31

## 2023-07-20 NOTE — Addendum Note (Signed)
Addended by: Geralyn Flash D on: 07/20/2023 10:54 AM   Modules accepted: Orders

## 2023-07-20 NOTE — Progress Notes (Signed)
Remote pacemaker transmission.

## 2023-08-03 ENCOUNTER — Ambulatory Visit
Admission: RE | Admit: 2023-08-03 | Discharge: 2023-08-03 | Disposition: A | Discharge status: Home / Self Care | Payer: PPO | Source: Ambulatory Visit | Attending: Family Medicine | Admitting: Family Medicine

## 2023-08-03 ENCOUNTER — Other Ambulatory Visit: Payer: Self-pay | Admitting: Family Medicine

## 2023-08-03 DIAGNOSIS — R058 Other specified cough: Secondary | ICD-10-CM

## 2023-08-14 ENCOUNTER — Ambulatory Visit: Payer: PPO | Attending: Cardiology

## 2023-08-14 DIAGNOSIS — I5022 Chronic systolic (congestive) heart failure: Secondary | ICD-10-CM | POA: Diagnosis not present

## 2023-08-14 DIAGNOSIS — N401 Enlarged prostate with lower urinary tract symptoms: Secondary | ICD-10-CM | POA: Diagnosis not present

## 2023-08-14 DIAGNOSIS — R3912 Poor urinary stream: Secondary | ICD-10-CM | POA: Diagnosis not present

## 2023-08-14 DIAGNOSIS — R3911 Hesitancy of micturition: Secondary | ICD-10-CM | POA: Diagnosis not present

## 2023-08-14 DIAGNOSIS — R351 Nocturia: Secondary | ICD-10-CM | POA: Diagnosis not present

## 2023-08-14 DIAGNOSIS — Z950 Presence of cardiac pacemaker: Secondary | ICD-10-CM | POA: Diagnosis not present

## 2023-08-16 ENCOUNTER — Ambulatory Visit: Admitting: Podiatry

## 2023-08-16 DIAGNOSIS — L989 Disorder of the skin and subcutaneous tissue, unspecified: Secondary | ICD-10-CM | POA: Diagnosis not present

## 2023-08-16 NOTE — Progress Notes (Signed)
 Subjective:  Patient ID: Joshua Hercules., male    DOB: 06/18/1945,  MRN: [id number]  Chief Complaint  Patient presents with   Callouses    78 y.o. male presents with the above complaint.  Patient presents for right midfoot benign skin lesion/painful porokeratotic lesion pain to touch patient would like to discuss treatment options for this he has not seen MRIs prior to seeing me denies any other acute complaints hurts with ambulation hurts with pressure.  Pain scale 7 out of 10 dull aching nature   Review of Systems: Negative except as noted in the HPI. Denies N/V/F/Ch.  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  Cardiac MRI 2021 with EF 39% and moderate RV dysfunction and non-compaction   Dyspnea    with some activity   GERD (gastroesophageal reflux disease)    occasional   Glaucoma    Hyperlipidemia    statin intolerant at doses > Crestor 5mg  3 times weekly   OSA on CPAP    PAF (paroxysmal atrial fibrillation) (HCC)    CHADS2VASC score 2 on apixaban    Current Outpatient Medications:    acetaminophen (TYLENOL) 500 MG tablet, Take 1,000 mg by mouth every 6 (six) hours as needed (PAIN)., Disp: , Rfl:    apixaban (ELIQUIS) 5 MG TABS tablet, Take 1 tablet (5 mg total) by mouth 2 (two) times daily., Disp: 60 tablet, Rfl: 0   cetirizine (ZYRTEC) 10 MG tablet, Take 10 mg by mouth at bedtime., Disp: , Rfl:    Cholecalciferol (VITAMIN D3) 50 MCG (2000 UT) TABS, Take 2,000 Units by mouth daily., Disp: , Rfl:    clindamycin (CLEOCIN) 150 MG capsule, SMARTSIG:4 Capsule(s) By Mouth Once, Disp: , Rfl:    eplerenone (INSPRA)  25 MG tablet, Take 1 tablet (25 mg total) by mouth daily., Disp: 90 tablet, Rfl: 3   Evolocumab (REPATHA SURECLICK) 140 MG/ML SOAJ, Inject 140 mg into the skin every 14 (fourteen) days., Disp: 6 mL, Rfl: 3   ezetimibe (ZETIA) 10 MG tablet, Take 1 tablet (10 mg total) by mouth daily., Disp: 90 tablet, Rfl: 0   ferrous sulfate 325 (65 FE) MG tablet, Take 325 mg by mouth in the morning., Disp: , Rfl:    fluticasone (FLONASE) 50 MCG/ACT nasal spray, Place 2 sprays into both nostrils 2 (two) times daily. , Disp: , Rfl:    furosemide (LASIX) 20 MG tablet, Take 1 tablet (20 mg total) by mouth daily as needed for fluid or edema., Disp: 90 tablet, Rfl: 2   Glucosamine Sulfate 750 MG TABS, Take 750 mg by mouth in the morning and at bedtime., Disp: , Rfl:    ipratropium (ATROVENT) 0.06 % nasal spray, SMARTSIG:2 Spray(s) Both Nares Twice Daily PRN, Disp: , Rfl:    losartan (COZAAR) 25 MG tablet, Take 0.5 tablets (12.5 mg total) by mouth at bedtime., Disp: 45 tablet, Rfl: 3   Melatonin 10 MG TABS, Take 10 mg by mouth at bedtime. , Disp: , Rfl:    Multiple Vitamin (MULTIVITAMIN WITH MINERALS) TABS tablet, Take 1  tablet by mouth every evening., Disp: , Rfl:    pantoprazole (PROTONIX) 40 MG tablet, Take 1 tablet (40 mg total) by mouth daily., Disp: 90 tablet, Rfl: 3   rosuvastatin (CRESTOR) 5 MG tablet, 1 tablet Orally MWF for 90 days, Disp: , Rfl:    sodium chloride (OCEAN) 0.65 % SOLN nasal spray, Place 1 spray into both nostrils as needed for congestion., Disp: 22.5 mL, Rfl: 0   tamsulosin (FLOMAX) 0.4 MG CAPS capsule, Take 0.4 mg by mouth at bedtime. , Disp: , Rfl:    Vericiguat (VERQUVO) 5 MG TABS, Take 1 tablet (5 mg total) by mouth daily., Disp: 30 tablet, Rfl: 11  Social History   Tobacco Use  Smoking Status Never  Smokeless Tobacco Never    Allergies  Allergen Reactions   Xarelto [Rivaroxaban] Rash    Dizziness    Empagliflozin Other (See Comments)    (JARDIANCE) dizziness   Spironolactone  Other (See Comments)    gynecomastia   Penicillins Other (See Comments)    Very flushed  Has patient had a PCN reaction causing immediate rash, facial/tongue/throat swelling, SOB or lightheadedness with hypotension: No Has patient had a PCN reaction causing severe rash involving mucus membranes or skin necrosis: No Has patient had a PCN reaction that required hospitalization No Has patient had a PCN reaction occurring within the last 10 years: No If all of the above answers are "NO", then may proceed with Cephalosporin use.    Statins Other (See Comments)    Pt c/o muscle aches with use of statins.   Vancomycin Rash   Objective:  There were no vitals filed for this visit. There is no height or weight on file to calculate BMI. Constitutional Well developed. Well nourished.  Vascular Dorsalis pedis pulses palpable bilaterally. Posterior tibial pulses palpable bilaterally. Capillary refill normal to all digits.  No cyanosis or clubbing noted. Pedal hair growth normal.  Neurologic Normal speech. Oriented to person, place, and time. Epicritic sensation to light touch grossly present bilaterally.  Dermatologic Right plantar midfoot porokeratotic lesion/benign skin lesion painful to touch.  No pinpoint bleeding noted upon debridement  Orthopedic: Normal joint ROM without pain or crepitus bilaterally. No visible deformities. No bony tenderness.   Radiographs: None Assessment:   1. Benign skin lesion    Plan:  Patient was evaluated and treated and all questions answered.  Right plantar midfoot benign skin lesion --Lesion was debrided today without complications. Hemostasis was achieved and the area was cleaned. Cantharone was applied followed by an occlusive bandage. Post procedure complications were discussed. Monitor for signs or symptoms of infection and directed to call the office mainly should any occur.   No follow-ups on file.

## 2023-08-16 NOTE — Progress Notes (Signed)
 EPIC Encounter for ICM Monitoring  Patient Name: Joshua Moran. is a 78 y.o. male Date: 08/16/2023 Primary Care Physican: Daisy Floro, MD Primary Cardiologist: Shirlee Latch Electrophysiologist: Lalla Brothers BiV Pacing: >99% 10/19/2022 Weight: 279 lbs (276 lbs) 11/25/2022 Weight: 281 lbs 12/28/2022 Weight: 281 lbs 03/15/2023 Weight: 278 lbs 04/21/2023 Weight: 278 lbs (fluctuates a couple of pounds)     07/13/2023 Weight: 282 lbs     08/16/2023 Weight: 284 lbs                                                   Spoke with patient and heart failure questions reviewed.  Transmission results reviewed.  Pt had weight gain during decreased impedance and took PRN Lasix.   CorVue thoracic impedance suggesting normal fluid levels with the exception of possible fluid accumulation from 2/28-3/6.   Prescribed:  Furosemide 20 mg take 1 tablet(s) (20 mg total) by mouth as directed (as needed).     Recommendations:  No changes and encouraged to call if experiencing any fluid symptoms.   Follow-up plan: ICM clinic phone appointment on 09/18/2023.   91 day device clinic remote transmission 09/11/2023.     EP/Cardiology Office Visits:  Recall 11/06/2023 with Dr Shirlee Latch.  Recall 04/18/2024 with Dr Lalla Brothers or Otilio Saber, PA.    Copy of ICM check sent to Dr. Lalla Brothers.  3 month ICM trend: 08/14/2023.    12-14 Month ICM trend:     Karie Soda, RN 08/16/2023 7:44 AM

## 2023-08-17 DIAGNOSIS — G4733 Obstructive sleep apnea (adult) (pediatric): Secondary | ICD-10-CM | POA: Diagnosis not present

## 2023-08-21 ENCOUNTER — Telehealth: Payer: Self-pay

## 2023-08-21 ENCOUNTER — Other Ambulatory Visit (HOSPITAL_COMMUNITY): Payer: Self-pay

## 2023-08-21 NOTE — Telephone Encounter (Signed)
 Patient Advocate Encounter   The patient was approved for a [employer] grant that will help cover the cost of REPATHA Total amount awarded, $2,500.  Effective: 09/04/23 - 09/02/24   [id number]   Patient informed via Dorcas Carrow, CPhT  Pharmacy Patient Advocate Specialist  Direct Number: [phone] Fax: [phone]

## 2023-08-25 ENCOUNTER — Ambulatory Visit (HOSPITAL_COMMUNITY)
Admission: RE | Admit: 2023-08-25 | Discharge: 2023-08-25 | Disposition: A | Discharge status: Home / Self Care | Source: Ambulatory Visit | Attending: Urology | Admitting: Urology

## 2023-08-25 ENCOUNTER — Other Ambulatory Visit (HOSPITAL_COMMUNITY): Payer: Self-pay | Admitting: Urology

## 2023-08-25 DIAGNOSIS — R319 Hematuria, unspecified: Secondary | ICD-10-CM | POA: Insufficient documentation

## 2023-08-25 DIAGNOSIS — M545 Low back pain, unspecified: Secondary | ICD-10-CM | POA: Insufficient documentation

## 2023-08-25 DIAGNOSIS — M47816 Spondylosis without myelopathy or radiculopathy, lumbar region: Secondary | ICD-10-CM | POA: Diagnosis not present

## 2023-08-25 DIAGNOSIS — G8929 Other chronic pain: Secondary | ICD-10-CM | POA: Diagnosis not present

## 2023-08-25 DIAGNOSIS — M4316 Spondylolisthesis, lumbar region: Secondary | ICD-10-CM | POA: Diagnosis not present

## 2023-08-26 ENCOUNTER — Other Ambulatory Visit: Payer: Self-pay | Admitting: Cardiology

## 2023-08-28 ENCOUNTER — Telehealth (INDEPENDENT_AMBULATORY_CARE_PROVIDER_SITE_OTHER): Payer: Self-pay | Admitting: Otolaryngology

## 2023-08-28 NOTE — Telephone Encounter (Signed)
 Reminder Call:  Date: 08/29/2023 Status: Sch  Time: 1:50 PM Confirmed time and location-[id number] [place], [place] [address]

## 2023-08-29 ENCOUNTER — Ambulatory Visit (INDEPENDENT_AMBULATORY_CARE_PROVIDER_SITE_OTHER): Payer: PPO | Admitting: Otolaryngology

## 2023-08-29 ENCOUNTER — Encounter (INDEPENDENT_AMBULATORY_CARE_PROVIDER_SITE_OTHER): Payer: Self-pay

## 2023-08-29 VITALS — BP 121/80 | HR 95 | Ht 71.0 in | Wt 284.0 lb

## 2023-08-29 DIAGNOSIS — J343 Hypertrophy of nasal turbinates: Secondary | ICD-10-CM | POA: Diagnosis not present

## 2023-08-29 DIAGNOSIS — J342 Deviated nasal septum: Secondary | ICD-10-CM

## 2023-08-29 DIAGNOSIS — R0982 Postnasal drip: Secondary | ICD-10-CM

## 2023-08-29 DIAGNOSIS — R0981 Nasal congestion: Secondary | ICD-10-CM | POA: Diagnosis not present

## 2023-08-29 DIAGNOSIS — J310 Chronic rhinitis: Secondary | ICD-10-CM | POA: Diagnosis not present

## 2023-08-29 NOTE — Progress Notes (Unsigned)
 Recurrent sinusitis chronic nasal patient ID: Joshua Moran., male   DOB: July 28, 1945, 78 y.o.   MRN: [id number]  Follow-up: Recurrent sinusitis, chronic nasal congestion  HPI: The patient is a 78 year old male who returns today for his follow-up evaluation.  He was previously seen for recurrent sinusitis and chronic nasal obstruction.  At his last visit 6 months ago, he was noted to have nasal septal deviation, postnasal drainage, and bilateral inferior turbinate hypertrophy.  He was treated with Flonase, Atrovent, and nasal saline irrigation.  The patient returns today complaining of an acute sinusitis 2 months ago.  He was treated with antibiotic and steroid. Currently he denies any facial pain, fever, or visual change.  Exam: General: Communicates without difficulty, well nourished, no acute distress. Head: Normocephalic, no evidence injury, no tenderness, facial buttresses intact without stepoff. Face/sinus: No tenderness to palpation and percussion. Facial movement is normal and symmetric. Eyes: PERRL, EOMI. No scleral icterus, conjunctivae clear. Neuro: CN II exam reveals vision grossly intact.  No nystagmus at any point of gaze. Ears: Auricles well formed without lesions.  Ear canals are intact without mass or lesion.  No erythema or edema is appreciated.  The TMs are intact without fluid. Nose: External evaluation reveals normal support and skin without lesions.  Dorsum is intact.  Anterior rhinoscopy reveals congested mucosa over anterior aspect of inferior turbinates and intact septum.  No purulence noted. Oral:  Oral cavity and oropharynx are intact, symmetric, without erythema or edema.  Mucosa is moist without lesions. Neck: Full range of motion without pain.  There is no significant lymphadenopathy.  No masses palpable.  Thyroid bed within normal limits to palpation.  Parotid glands and submandibular glands equal bilaterally without mass.  Trachea is midline. Neuro:  CN 2-12 grossly intact.    Assessment: 1.  Chronic rhinitis with nasal mucosal congestion, nasal septal deviation, and bilateral inferior turbinate hypertrophy. 2.  Chronic postnasal drainage. 3.  No sinusitis is noted today.  Plan: 1.  The nasal endoscopy findings are reviewed with the patient. 2.  Continue with Flonase nasal spray 2 sprays each nostril daily. 3.  Atrovent nasal spray to treat his chronic postnasal drainage. 4.  The patient will return for reevaluation in 6 months.

## 2023-08-30 ENCOUNTER — Ambulatory Visit: Admitting: Podiatry

## 2023-08-30 DIAGNOSIS — R058 Other specified cough: Secondary | ICD-10-CM | POA: Insufficient documentation

## 2023-08-30 DIAGNOSIS — L989 Disorder of the skin and subcutaneous tissue, unspecified: Secondary | ICD-10-CM

## 2023-08-30 DIAGNOSIS — R0982 Postnasal drip: Secondary | ICD-10-CM | POA: Insufficient documentation

## 2023-08-30 DIAGNOSIS — J343 Hypertrophy of nasal turbinates: Secondary | ICD-10-CM | POA: Insufficient documentation

## 2023-08-30 DIAGNOSIS — J310 Chronic rhinitis: Secondary | ICD-10-CM | POA: Insufficient documentation

## 2023-08-30 DIAGNOSIS — J342 Deviated nasal septum: Secondary | ICD-10-CM | POA: Insufficient documentation

## 2023-08-30 NOTE — Progress Notes (Signed)
 Subjective:  Patient ID: Joshua Hercules., male    DOB: 1945/10/19,  MRN: [id number]  Chief Complaint  Patient presents with   Plantar Warts    78 y.o. male presents with the above complaint.  Patient presents with right midfoot benign skin lesion he states is doing well.  Ankle therapy helped.  He would like to do another application.   Review of Systems: Negative except as noted in the HPI. Denies N/V/F/Ch.  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  Cardiac MRI 2021 with EF 39% and moderate RV dysfunction and non-compaction   Dyspnea    with some activity   GERD (gastroesophageal reflux disease)    occasional   Glaucoma    Hyperlipidemia    statin intolerant at doses > Crestor 5mg  3 times weekly   OSA on CPAP    PAF (paroxysmal atrial fibrillation) (HCC)    CHADS2VASC score 2 on apixaban    Current Outpatient Medications:    acetaminophen (TYLENOL) 500 MG tablet, Take 1,000 mg by mouth every 6 (six) hours as needed (PAIN)., Disp: , Rfl:    apixaban (ELIQUIS) 5 MG TABS tablet, Take 1 tablet (5 mg total) by mouth 2 (two) times daily., Disp: 60 tablet, Rfl: 0   cetirizine (ZYRTEC) 10 MG tablet, Take 10 mg by mouth at bedtime., Disp: , Rfl:    Cholecalciferol (VITAMIN D3) 50 MCG (2000 UT) TABS, Take 2,000 Units by mouth daily., Disp: , Rfl:    clindamycin (CLEOCIN) 150 MG capsule, SMARTSIG:4 Capsule(s) By Mouth Once, Disp: , Rfl:    eplerenone (INSPRA) 25 MG tablet, Take 1 tablet (25 mg total) by mouth daily., Disp: 90 tablet, Rfl: 3   Evolocumab (REPATHA SURECLICK) 140 MG/ML SOAJ, INJECT 140MG  INTO THE SKIN EVERY  2 WEEKS, Disp: 6 mL, Rfl: 3   ezetimibe (ZETIA) 10 MG tablet, Take 1 tablet (10 mg total) by mouth daily., Disp: 90 tablet, Rfl: 0   ferrous sulfate 325 (65 FE) MG tablet, Take 325 mg by mouth in the morning., Disp: , Rfl:    fluticasone (FLONASE) 50 MCG/ACT nasal spray, Place 2 sprays into both nostrils 2 (two) times daily. , Disp: , Rfl:    furosemide (LASIX) 20 MG tablet, Take 1 tablet (20 mg total) by mouth daily as needed for fluid or edema., Disp: 90 tablet, Rfl: 2   Glucosamine Sulfate 750 MG TABS, Take 750 mg by mouth in the morning and at bedtime., Disp: , Rfl:    ipratropium (ATROVENT) 0.06 % nasal spray, SMARTSIG:2 Spray(s) Both Nares Twice Daily PRN, Disp: , Rfl:    losartan (COZAAR) 25 MG tablet, Take 0.5 tablets (12.5 mg total) by mouth at bedtime., Disp: 45 tablet, Rfl: 3   Melatonin 10 MG TABS, Take 10 mg by mouth at bedtime. , Disp: , Rfl:    Multiple Vitamin (MULTIVITAMIN WITH MINERALS) TABS tablet, Take 1 tablet by mouth every evening., Disp: , Rfl:    pantoprazole (PROTONIX) 40 MG tablet, Take 1 tablet (40 mg total) by mouth daily., Disp: 90 tablet,  Rfl: 3   rosuvastatin (CRESTOR) 5 MG tablet, 1 tablet Orally MWF for 90 days, Disp: , Rfl:    sodium chloride (OCEAN) 0.65 % SOLN nasal spray, Place 1 spray into both nostrils as needed for congestion., Disp: 22.5 mL, Rfl: 0   tamsulosin (FLOMAX) 0.4 MG CAPS capsule, Take 0.4 mg by mouth at bedtime. , Disp: , Rfl:    Vericiguat (VERQUVO) 5 MG TABS, Take 1 tablet (5 mg total) by mouth daily., Disp: 30 tablet, Rfl: 11  Social History   Tobacco Use  Smoking Status Never  Smokeless Tobacco Never    Allergies  Allergen Reactions   Xarelto [Rivaroxaban] Rash    Dizziness    Empagliflozin Other (See Comments)    (JARDIANCE) dizziness   Spironolactone Other (See Comments)    gynecomastia   Penicillins Other (See Comments)    Very flushed  Has patient had a PCN reaction causing immediate rash, facial/tongue/throat swelling, SOB  or lightheadedness with hypotension: No Has patient had a PCN reaction causing severe rash involving mucus membranes or skin necrosis: No Has patient had a PCN reaction that required hospitalization No Has patient had a PCN reaction occurring within the last 10 years: No If all of the above answers are "NO", then may proceed with Cephalosporin use.    Statins Other (See Comments)    Pt c/o muscle aches with use of statins.   Vancomycin Rash   Objective:  There were no vitals filed for this visit. There is no height or weight on file to calculate BMI. Constitutional Well developed. Well nourished.  Vascular Dorsalis pedis pulses palpable bilaterally. Posterior tibial pulses palpable bilaterally. Capillary refill normal to all digits.  No cyanosis or clubbing noted. Pedal hair growth normal.  Neurologic Normal speech. Oriented to person, place, and time. Epicritic sensation to light touch grossly present bilaterally.  Dermatologic Right plantar midfoot porokeratotic lesion/benign skin lesion painful to touch.  Improving no pinpoint bleeding noted upon debridement  Orthopedic: Normal joint ROM without pain or crepitus bilaterally. No visible deformities. No bony tenderness.   Radiographs: None Assessment:   No diagnosis found.  Plan:  Patient was evaluated and treated and all questions answered.  Right plantar midfoot benign skin lesion~second application --Lesion was debrided today without complications. Hemostasis was achieved and the area was cleaned. Cantharone was applied followed by an occlusive bandage. Post procedure complications were discussed. Monitor for signs or symptoms of infection and directed to call the office mainly should any occur.   No follow-ups on file.

## 2023-09-11 ENCOUNTER — Ambulatory Visit (INDEPENDENT_AMBULATORY_CARE_PROVIDER_SITE_OTHER): Payer: PPO

## 2023-09-11 DIAGNOSIS — I447 Left bundle-branch block, unspecified: Secondary | ICD-10-CM

## 2023-09-11 DIAGNOSIS — I5022 Chronic systolic (congestive) heart failure: Secondary | ICD-10-CM

## 2023-09-11 LAB — CUP PACEART REMOTE DEVICE CHECK
Battery Remaining Longevity: 71 mo
Battery Remaining Percentage: 72 %
Battery Voltage: 3.01 V
Date Time Interrogation Session: 20250407033358
Implantable Lead Connection Status: 753985
Implantable Lead Connection Status: 753985
Implantable Lead Implant Date: 20220708
Implantable Lead Implant Date: 20220708
Implantable Lead Location: 753858
Implantable Lead Location: 753860
Implantable Pulse Generator Implant Date: 20220708
Lead Channel Impedance Value: 1225 Ohm
Lead Channel Impedance Value: 550 Ohm
Lead Channel Pacing Threshold Amplitude: 0.75 V
Lead Channel Pacing Threshold Amplitude: 1 V
Lead Channel Pacing Threshold Pulse Width: 0.5 ms
Lead Channel Pacing Threshold Pulse Width: 0.5 ms
Lead Channel Sensing Intrinsic Amplitude: 7.9 mV
Lead Channel Setting Pacing Amplitude: 2.5 V
Lead Channel Setting Pacing Amplitude: 2.5 V
Lead Channel Setting Pacing Pulse Width: 0.5 ms
Lead Channel Setting Pacing Pulse Width: 0.5 ms
Lead Channel Setting Sensing Sensitivity: 4 mV
Pulse Gen Model: 3562
Pulse Gen Serial Number: 3913632

## 2023-09-16 ENCOUNTER — Encounter: Payer: Self-pay | Admitting: Cardiology

## 2023-09-18 ENCOUNTER — Ambulatory Visit: Attending: Cardiology

## 2023-09-18 DIAGNOSIS — Z950 Presence of cardiac pacemaker: Secondary | ICD-10-CM

## 2023-09-18 DIAGNOSIS — I5022 Chronic systolic (congestive) heart failure: Secondary | ICD-10-CM | POA: Diagnosis not present

## 2023-09-20 NOTE — Progress Notes (Signed)
 EPIC Encounter for ICM Monitoring  Patient Name: Joshua Moran. is a 78 y.o. male Date: 09/20/2023 Primary Care Physican: Jimmey Mould, MD Primary Cardiologist: Mitzie Anda Electrophysiologist: Marven Slimmer BiV Pacing: >99% 10/19/2022 Weight: 279 lbs (276 lbs) 11/25/2022 Weight: 281 lbs 12/28/2022 Weight: 281 lbs 03/15/2023 Weight: 278 lbs 04/21/2023 Weight: 278 lbs (fluctuates a couple of pounds)     07/13/2023 Weight: 282 lbs     08/16/2023 Weight: 284 lbs   283 lbs                                                 Spoke with patient and heart failure questions reviewed.  Transmission results reviewed.  Pt is feeling well.  He took PRN Lasix twice in the past month due to weight gain.   CorVue thoracic impedance suggesting normal fluid levels with the exception of possible fluid accumulation from 3/17-3/24.   Prescribed:  Furosemide 20 mg take 1 tablet(s) (20 mg total) by mouth as directed (as needed).     Recommendations:  No changes and encouraged to call if experiencing any fluid symptoms.   Follow-up plan: ICM clinic phone appointment on 10/23/2023.   91 day device clinic remote transmission 12/11/2023.     EP/Cardiology Office Visits:  11/13/2023 with Dr Mitzie Anda with echo.  Recall 04/18/2024 with Dr Marven Slimmer or Michaelle Adolphus, PA.    Copy of ICM check sent to Dr. Marven Slimmer.  3 month ICM trend: 09/18/2023.    12-14 Month ICM trend:     Almyra Jain, RN 09/20/2023 2:21 PM

## 2023-10-16 DIAGNOSIS — R059 Cough, unspecified: Secondary | ICD-10-CM | POA: Diagnosis not present

## 2023-10-16 DIAGNOSIS — Z6841 Body Mass Index (BMI) 40.0 and over, adult: Secondary | ICD-10-CM | POA: Diagnosis not present

## 2023-10-23 ENCOUNTER — Ambulatory Visit: Attending: Cardiology

## 2023-10-23 DIAGNOSIS — I5022 Chronic systolic (congestive) heart failure: Secondary | ICD-10-CM | POA: Diagnosis not present

## 2023-10-23 DIAGNOSIS — Z950 Presence of cardiac pacemaker: Secondary | ICD-10-CM

## 2023-10-24 ENCOUNTER — Telehealth: Payer: Self-pay

## 2023-10-24 NOTE — Progress Notes (Signed)
 Remote pacemaker transmission.

## 2023-10-24 NOTE — Progress Notes (Signed)
 EPIC Encounter for ICM Monitoring  Patient Name: Joshua Moran. is a 78 y.o. male Date: 10/24/2023 Primary Care Physican: Jimmey Mould, MD Primary Cardiologist: Mitzie Anda Electrophysiologist: Marven Slimmer BiV Pacing: >99% 10/19/2022 Weight: 279 lbs (276 lbs) 11/25/2022 Weight: 281 lbs 12/28/2022 Weight: 281 lbs 03/15/2023 Weight: 278 lbs 04/21/2023 Weight: 278 lbs (fluctuates a couple of pounds)     07/13/2023 Weight: 282 lbs     08/16/2023 Weight: 284 lbs   09/18/2023 Weight: 283 lbs                                                 Attempted call to patient and unable to reach.  Left detailed message per DPR regarding transmission.  Transmission results reviewed.    CorVue thoracic impedance suggesting normal fluid levels within the last month.   Prescribed:  Furosemide  20 mg take 1 tablet(s) (20 mg total) by mouth as directed (as needed).     Recommendations:  Left voice mail with ICM number and encouraged to call if experiencing any fluid symptoms.   Follow-up plan: ICM clinic phone appointment on 12/25/2023.   91 day device clinic remote transmission 12/11/2023.     EP/Cardiology Office Visits:  11/13/2023 with Dr Mitzie Anda with echo.  Recall 04/18/2024 with Dr Marven Slimmer or Michaelle Adolphus, PA.    Copy of ICM check sent to Dr. Marven Slimmer.  3 month ICM trend: 10/23/2023.    12-14 Month ICM trend:     Almyra Jain, RN 10/24/2023 4:31 PM

## 2023-10-24 NOTE — Telephone Encounter (Signed)
 Remote ICM transmission received.  Attempted call to patient regarding ICM remote transmission and left detailed message per DPR.  Left ICM phone number and advised to return call for any fluid symptoms or questions. Next ICM remote transmission scheduled 12/25/2023.

## 2023-10-26 DIAGNOSIS — G8929 Other chronic pain: Secondary | ICD-10-CM | POA: Diagnosis not present

## 2023-10-26 DIAGNOSIS — M5459 Other low back pain: Secondary | ICD-10-CM | POA: Diagnosis not present

## 2023-10-26 DIAGNOSIS — M47816 Spondylosis without myelopathy or radiculopathy, lumbar region: Secondary | ICD-10-CM | POA: Diagnosis not present

## 2023-10-26 DIAGNOSIS — M542 Cervicalgia: Secondary | ICD-10-CM | POA: Diagnosis not present

## 2023-11-06 DIAGNOSIS — Z961 Presence of intraocular lens: Secondary | ICD-10-CM | POA: Diagnosis not present

## 2023-11-06 DIAGNOSIS — H401113 Primary open-angle glaucoma, right eye, severe stage: Secondary | ICD-10-CM | POA: Diagnosis not present

## 2023-11-13 ENCOUNTER — Ambulatory Visit (HOSPITAL_COMMUNITY)
Admission: RE | Admit: 2023-11-13 | Discharge: 2023-11-13 | Disposition: A | Discharge status: Home / Self Care | Source: Ambulatory Visit | Attending: Family Medicine | Admitting: Family Medicine

## 2023-11-13 ENCOUNTER — Other Ambulatory Visit (HOSPITAL_COMMUNITY): Payer: Self-pay

## 2023-11-13 ENCOUNTER — Ambulatory Visit (HOSPITAL_COMMUNITY): Payer: Self-pay | Admitting: Cardiology

## 2023-11-13 ENCOUNTER — Ambulatory Visit (HOSPITAL_BASED_OUTPATIENT_CLINIC_OR_DEPARTMENT_OTHER)
Admission: RE | Admit: 2023-11-13 | Discharge: 2023-11-13 | Disposition: A | Discharge status: Home / Self Care | Source: Ambulatory Visit | Attending: Cardiology | Admitting: Cardiology

## 2023-11-13 ENCOUNTER — Telehealth (HOSPITAL_COMMUNITY): Payer: Self-pay

## 2023-11-13 VITALS — BP 114/78 | HR 97 | Wt 290.4 lb

## 2023-11-13 DIAGNOSIS — I4821 Permanent atrial fibrillation: Secondary | ICD-10-CM | POA: Insufficient documentation

## 2023-11-13 DIAGNOSIS — I5022 Chronic systolic (congestive) heart failure: Secondary | ICD-10-CM

## 2023-11-13 DIAGNOSIS — Z4502 Encounter for adjustment and management of automatic implantable cardiac defibrillator: Secondary | ICD-10-CM | POA: Insufficient documentation

## 2023-11-13 DIAGNOSIS — I2510 Atherosclerotic heart disease of native coronary artery without angina pectoris: Secondary | ICD-10-CM | POA: Diagnosis not present

## 2023-11-13 DIAGNOSIS — I429 Cardiomyopathy, unspecified: Secondary | ICD-10-CM | POA: Insufficient documentation

## 2023-11-13 DIAGNOSIS — E785 Hyperlipidemia, unspecified: Secondary | ICD-10-CM | POA: Diagnosis not present

## 2023-11-13 DIAGNOSIS — Z79899 Other long term (current) drug therapy: Secondary | ICD-10-CM | POA: Insufficient documentation

## 2023-11-13 DIAGNOSIS — Z7901 Long term (current) use of anticoagulants: Secondary | ICD-10-CM | POA: Diagnosis not present

## 2023-11-13 DIAGNOSIS — I358 Other nonrheumatic aortic valve disorders: Secondary | ICD-10-CM | POA: Insufficient documentation

## 2023-11-13 DIAGNOSIS — I480 Paroxysmal atrial fibrillation: Secondary | ICD-10-CM | POA: Insufficient documentation

## 2023-11-13 DIAGNOSIS — G4733 Obstructive sleep apnea (adult) (pediatric): Secondary | ICD-10-CM | POA: Insufficient documentation

## 2023-11-13 DIAGNOSIS — I951 Orthostatic hypotension: Secondary | ICD-10-CM | POA: Diagnosis not present

## 2023-11-13 LAB — LIPID PANEL
Cholesterol: 97 mg/dL (ref 0–200)
HDL: 44 mg/dL (ref >40)
LDL Cholesterol: 40 mg/dL (ref 0–99)
Total CHOL/HDL Ratio: 2.2 ratio
Triglycerides: 66 mg/dL (ref <150)
VLDL: 13 mg/dL (ref 0–40)

## 2023-11-13 LAB — BASIC METABOLIC PANEL WITH GFR
Anion gap: 6 (ref 5–15)
BUN: 14 mg/dL (ref 8–23)
CO2: 24 mmol/L (ref 22–32)
Calcium: 8.6 mg/dL — ABNORMAL LOW (ref 8.9–10.3)
Chloride: 108 mmol/L (ref 98–111)
Creatinine, Ser: 1.05 mg/dL (ref 0.61–1.24)
GFR, Estimated: >60 mL/min (ref >60)
Glucose, Bld: 100 mg/dL — ABNORMAL HIGH (ref 70–99)
Potassium: 4.6 mmol/L (ref 3.5–5.1)
Sodium: 138 mmol/L (ref 135–145)

## 2023-11-13 LAB — BRAIN NATRIURETIC PEPTIDE: B Natriuretic Peptide: 128.2 pg/mL — ABNORMAL HIGH (ref 0.0–100.0)

## 2023-11-13 MED ORDER — EMPAGLIFLOZIN 10 MG PO TABS: 10.0000 mg | ORAL_TABLET | Freq: Every day | ORAL | 1 refills | Status: DC

## 2023-11-13 MED ORDER — EPLERENONE 50 MG PO TABS: 50.0000 mg | ORAL_TABLET | Freq: Every day | ORAL | 1 refills | Status: DC

## 2023-11-13 NOTE — Progress Notes (Signed)
 [facility] Progress Note    PCP: Jimmey Mould, MD Cardiology: Dr. Micael Adas HF Cardiology: Dr. Mitzie Anda  Chief complaint: CHF  78 y.o. with history of chronic systolic CHF and paroxysmal atrial fibrillation was referred by Dr. Micael Adas for evaluation of CHF.  Patient has been short of breath for years, worse over the last 6 months.  He has had an extensive workup.  Echo in 3/21 showed EF 45-50% with mildly decreased RV function.  Cardiolite  in 4/21 showed fixed apical defect.  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  PYP scan in 8/21 was not suggestive of TTR cardiac amyloidosis.   He had been in persistent atrial fibrillation for about 2 years.  He saw Dr. Marven Slimmer and was started on amiodarone .  DCCV was done in 10/21 back to NSR.  He was found to have M-spike on SPEP.  He saw Dr. Maria Shiner who thought this was MGUS.   LHC/RHC in 12/21 showed occluded small OM2 and 80-90% stenosis mid PLOM (small caliber), no interventional target; normal filling pressures and preserved cardiac output.   He was unable to tolerate Jardiance  due to extreme dizziness. He is off both Coreg  and Toprol  XL due to lightheadedness/hypotension.   He developed recurrent atrial fibrillation and failed DCCV in 4/22.  He has been in persistent atrial fibrillation, Dr Marven Slimmer stopped his amiodarone  due to futility.  In 7/22, he had AV nodal ablation with St Jude CRT-P device placement.   Echo in 5/22 showed EF 35-40%, mild LV enlargement, normal RV.  Echo in 5/23 showed EF 35-40%, diffuse hypokinesis. RV mildly reduced.   He has had issues recently tolerating GDMT due to low BP.   Echo (6/24) showed EF 50-55%, normal RV (poor images, may not be accurate EF).  Echo was done today and reviewed, EF 35-40% with diffuse hypokinesis, mild RV dilation/mild RV dysfunction, mild MR, severe biatrial enlargement.   Today  he returns for HF follow up with his wife. Weight up about 5 lbs.  He rarely uses Lasix .  He is short of breath walking more than about 100 feet or lifting heavy objects.  No chest pain.  No orthopnea/PND.  Generally does ok with ADLs around the house.   St Jude device interrogation: 99% BiV paced, thoracic impedance recently decreased, now trending up.     ECG (personally reviewed): AF with BiV pacing.   Labs (2/24): K 3.9, creatinine 0.93, LFTs normal, LDL 86 Labs (6/24): K 4.1, creatinine 0.95 Labs (8/24): K 4.2, creatinine 0.98, LDL 34 Labs (12/24): K 4.3, creatinine 0.89  PMH: 1. CVA: Occurred in 3/21, he was not on apixaban  at the time.  2. GERD 3. OSA: Uses CPAP.  4. Hyperlipidemia 5. Atrial fibrillation: Permanent - DCCV to NSR in 10/21.  - Failed DCCV in 4/22, stopped amiodarone .  - AV nodal ablation with St Jude CRT-P in 7/22.  6. MGUS 7. Chronic systolic CHF: Echo (3/21) with EF 45-50%, mild LVH, mildly decreased RV systolic function. Primarily nonischemic cardiomyopathy.  - Cardiolite  (4/21): Fixed apical defect.  - Cardiac MRI (9/21): Mild LV dilation with mild LV hypertrophy, EF 39%, prominent trabeculations concerning for noncompaction. Mild RV dilation with EF 28%, severe LAE.  No delayed enhancement.  - PYP scan (8/21): grade 1, H/CL 1-1.5.  Unlikely to have cardiac amyloidosis.  - LHC/RHC (12/21): small OM2 totally occluded, 80-90% mid small PLOM (no interventional  target); mean RA 5, PA 25/9, mean PCWP 5, CI 2.77 Fick/3.29 Thermo.  - St Jude CRT-P  - Echo (5/23): EF 35-40%, diffuse hypokinesis, mildly decreased RV systolic function, IVC normal.  - Echo (6/24): EF 50-55%, normal RV (poor images, ?accuracy). - Echo (6/25): EF 35-40% with diffuse hypokinesis, mild RV dilation/mild RV dysfunction, mild MR, severe biatrial enlargement.  8. Orthostatic hypotension.  9. CAD: Coronary angiography 12/21 with small OM2 totally occluded, 80-90% mid small PLOM (no interventional  target) 10. Carotid ultrasound (8/22): mild BICA stenosis.  11. COVID-19 9/22 12. PVCs  Social History   Socioeconomic History   Marital status: Married    Spouse name: Not on file   Number of children: Not on file   Years of education: Not on file   Highest education level: Not on file  Occupational History   Not on file  Tobacco Use   Smoking status: Never   Smokeless tobacco: Never  Vaping Use   Vaping status: Never Used  Substance and Sexual Activity   Alcohol use: Yes    Alcohol/week: 1.0 standard drink of alcohol    Types: 1 Cans of beer per week    Comment: Rare   Drug use: No   Sexual activity: Not Currently  Other Topics Concern   Not on file  Social History Narrative   Not on file   Social Drivers of Health   Financial Resource Strain: Not on file  Food Insecurity: Not on file  Transportation Needs: Not on file  Physical Activity: Not on file  Stress: Not on file  Social Connections: Not on file  Intimate Partner Violence: Not on file   Family History  Problem Relation Age of Onset   Lung cancer Mother        smoker   ROS: All systems reviewed and negative except as per HPI.   Current Outpatient Medications  Medication Sig Dispense Refill   acetaminophen  (TYLENOL ) 500 MG tablet 1,000 mg. Patient takes 2 tablets by mouth in the morning and night     apixaban  (ELIQUIS ) 5 MG TABS tablet Take 1 tablet (5 mg total) by mouth 2 (two) times daily. 60 tablet 0   cetirizine (ZYRTEC) 10 MG tablet Take 10 mg by mouth at bedtime.     Cholecalciferol (VITAMIN D3) 50 MCG (2000 UT) TABS Take 2,000 Units by mouth daily.     clindamycin  (CLEOCIN ) 150 MG capsule SMARTSIG:4 Capsule(s) By Mouth Once     empagliflozin  (JARDIANCE ) 10 MG TABS tablet Take 1 tablet (10 mg total) by mouth daily before breakfast. 90 tablet 1   Evolocumab  (REPATHA  SURECLICK) 140 MG/ML SOAJ INJECT 140MG  INTO THE SKIN EVERY 2 WEEKS 6 mL 3   ezetimibe  (ZETIA ) 10 MG tablet Take 1 tablet (10 mg  total) by mouth daily. 90 tablet 0   ferrous sulfate  325 (65 FE) MG tablet Take 325 mg by mouth in the morning.     fluticasone  (FLONASE ) 50 MCG/ACT nasal spray Place 2 sprays into both nostrils 2 (two) times daily.      Glucosamine Sulfate 750 MG TABS Take 750 mg by mouth in the morning and at bedtime.     ipratropium (ATROVENT) 0.06 % nasal spray SMARTSIG:2 Spray(s) Both Nares Twice Daily PRN     losartan  (COZAAR ) 25 MG tablet Take 0.5 tablets (12.5 mg total) by mouth at bedtime. 45 tablet 3   Melatonin 10 MG TABS Take 10 mg by mouth at bedtime.      Multiple  Vitamin (MULTIVITAMIN WITH MINERALS) TABS tablet Take 1 tablet by mouth every evening.     pantoprazole  (PROTONIX ) 40 MG tablet Take 1 tablet (40 mg total) by mouth daily. 90 tablet 3   rosuvastatin  (CRESTOR ) 5 MG tablet 1 tablet Orally MWF for 90 days     sodium chloride  (OCEAN) 0.65 % SOLN nasal spray Place 1 spray into both nostrils as needed for congestion. 22.5 mL 0   tamsulosin  (FLOMAX ) 0.4 MG CAPS capsule Take 0.4 mg by mouth at bedtime.      Vericiguat  (VERQUVO ) 5 MG TABS Take 1 tablet (5 mg total) by mouth daily. 30 tablet 11   eplerenone  (INSPRA ) 50 MG tablet Take 1 tablet (50 mg total) by mouth at bedtime. 90 tablet 1   furosemide  (LASIX ) 20 MG tablet Take 1 tablet (20 mg total) by mouth daily as needed for fluid or edema. 90 tablet 2   No current facility-administered medications for this encounter.   BP 114/78   Pulse 97   Wt 131.7 kg (290 lb 6.4 oz)   SpO2 96%   BMI 40.50 kg/m   Wt Readings from Last 3 Encounters:  11/13/23 131.7 kg (290 lb 6.4 oz)  08/29/23 128.8 kg (284 lb)  05/10/23 129.5 kg (285 lb 6.4 oz)   PHYSICAL EXAM: General: NAD Neck: JPV 7-8 cm, no thyromegaly or thyroid  nodule.  Lungs: Clear to auscultation bilaterally with normal respiratory effort. CV: Nondisplaced PMI.  Heart regular S1/S2, no S3/S4, no murmur.  Trace ankle edema with venous varicosities.  No carotid bruit.  Normal pedal pulses.   Abdomen: Soft, nontender, no hepatosplenomegaly, no distention.  Skin: Intact without lesions or rashes.  Neurologic: Alert and oriented x 3.  Psych: Normal affect. Extremities: No clubbing or cyanosis.  HEENT: Normal.   Assessment/Plan: 1. Chronic systolic CHF: Cardiac MRI in 9/21 with EF 39% and evidence for noncompaction cardiomyopathy, no delayed enhancement, significant RV dysfunction with RV EF 22%.  PYP scan was not suggestive of TTR amyloidosis and no symptoms or peripheral neuropathy.  12/21 LHC showed CAD but does not explain cardiomyopathy, no interventional target.  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  Echo 10/2021 showed  EF 35-40%, diffuse hypokinesis, mildly decreased RV systolic function, IVC normal. He has a history of orthostatic hypotension which has limited medication titration but this is doing better since AV nodal ablation with St Jude CRT-P.  Echo (6/24) showed EF 50-55%, normal RV. However, this was a difficult study, and echo today showing EF 35-40% with diffuse hypokinesis, mild RV dilation/mild RV dysfunction is more in line with prior echoes.  NYHA class III symptoms, not particularly volume overloaded on exam but weight is up and Corvue has showed decreased thoracic impedance (though starting to trend up).  - He is willing to try SGLT2 inhibitor again.  Rather than starting standing Lasix , I will have him start Jardiance  10 mg daily.  - Continue Lasix  20 prn.  - Continue Verquvo  5 mg daily.  - Increase eplerenone  to 50 mg qhs.  BMET/BNP today and BMET in 10 days. - Continue losartan  12.5 mg at bed time due to h/o orthostasis. Suspect BP will not tolerate transition to Entresto.  - Unable to tolerate Coreg  or Toprol  XL due to lightheadedness/low BP.  - Not candidate for cardiac contractility modulation due to CRT.  2. Atrial fibrillation: Permanent.  He has severe LAE. Not good candidate for atrial fibrillation ablation per EP.  He failed  DCCV on amiodarone   in 4/22, amiodarone  stopped.  AV nodal ablation with St Jude CRT-P in 7/22. - Continue apixaban  5 mg bid. . 3. OSA: Continue CPAP.  4. Orthostatic hypotension: Limits medications.   - Continue compression stockings.  - Encouraged slow positional changes.   5. CAD: Moderate disease on cath in 12/21, no interventional target.  No chest pain.  Extent of disease does not explain cardiomyopathy.   - He is on Crestor  5 mg qod and is now back on Repatha .  Check lipids today.  - He is on apixaban  with stable CAD, so no ASA.   Followup in 1 month with APP.   I spent 31 minutes reviewing records, interviewing/examining patient, and managing orders.   Peder Bourdon,  11/13/2023

## 2023-11-13 NOTE — Telephone Encounter (Signed)
 Advanced Heart Failure Patient Advocate Encounter  Test billing for this patients current coverage shows a copay of $0 for 30 or 90 day supply of Jardiance . Assistance not needed for this medication at this time.  Kennis Peacock, CPhT Rx Patient Advocate Phone: [phone]

## 2023-11-13 NOTE — Patient Instructions (Signed)
 Good to see you today!  INCREASE Eplerenone  to 50 mg at bedtime  TAKE losartan  at bedtime nightly  START Jardiance  10 mg daily  Labs done today, your results will be available in MyChart, we will contact you for abnormal readings.  Repeat lab work in 10 days  Your physician recommends that you schedule a follow-up appointment in: as scheduled  If you have any questions or concerns before your next appointment please send us  a message through [place] or call our office at [phone].    TO LEAVE A MESSAGE FOR THE NURSE SELECT OPTION 2, PLEASE LEAVE A MESSAGE INCLUDING: YOUR NAME DATE OF BIRTH CALL BACK NUMBER REASON FOR CALL**this is important as we prioritize the call backs  YOU WILL RECEIVE A CALL BACK THE SAME DAY AS LONG AS YOU CALL BEFORE 4:00 PM At the [facility], you and your health needs are our priority. As part of our continuing mission to provide you with exceptional heart care, we have created designated Provider Care Teams. These Care Teams include your primary Cardiologist (physician) and Advanced Practice Providers (APPs- Physician Assistants and Nurse Practitioners) who all work together to provide you with the care you need, when you need it.   You may see any of the following providers on your designated Care Team at your next follow up: Dr Jules Oar Dr Peder Bourdon Dr. Alwin Baars Dr. Arta Lark Amy Marijane Shoulders, NP Ruddy Corral, [place] [facility] [place], [place] Dennise Fitz, NP [place] Lee, NP Shawnee Dellen, NP Luster Salters, PharmD Bevely Brush, PharmD   Please be sure to bring in all your medications bottles to every appointment.    Thank you for choosing [facility]

## 2023-11-14 LAB — ECHOCARDIOGRAM COMPLETE
Calc EF: 36.8 %
S' Lateral: 4.6 cm
Single Plane A2C EF: 38.9 %
Single Plane A4C EF: 36.5 %

## 2023-11-15 ENCOUNTER — Ambulatory Visit (HOSPITAL_COMMUNITY): Payer: Self-pay | Admitting: Family Medicine

## 2023-11-15 ENCOUNTER — Other Ambulatory Visit: Payer: Self-pay | Admitting: Cardiology

## 2023-11-16 DIAGNOSIS — M47816 Spondylosis without myelopathy or radiculopathy, lumbar region: Secondary | ICD-10-CM | POA: Diagnosis not present

## 2023-11-17 ENCOUNTER — Other Ambulatory Visit (HOSPITAL_COMMUNITY): Payer: Self-pay | Admitting: Cardiology

## 2023-11-22 ENCOUNTER — Telehealth (HOSPITAL_COMMUNITY): Payer: Self-pay

## 2023-11-22 ENCOUNTER — Ambulatory Visit: Attending: Cardiology | Admitting: Cardiology

## 2023-11-22 ENCOUNTER — Telehealth: Payer: Self-pay | Admitting: *Deleted

## 2023-11-22 VITALS — BP 84/58 | HR 73 | Ht 71.0 in | Wt 287.0 lb

## 2023-11-22 DIAGNOSIS — I951 Orthostatic hypotension: Secondary | ICD-10-CM | POA: Diagnosis not present

## 2023-11-22 DIAGNOSIS — G4733 Obstructive sleep apnea (adult) (pediatric): Secondary | ICD-10-CM | POA: Diagnosis not present

## 2023-11-22 DIAGNOSIS — I5022 Chronic systolic (congestive) heart failure: Secondary | ICD-10-CM

## 2023-11-22 MED ORDER — EPLERENONE 50 MG PO TABS: 25.0000 mg | ORAL_TABLET | Freq: Every day | ORAL | Status: DC

## 2023-11-22 MED ORDER — EPLERENONE 25 MG PO TABS: 25.0000 mg | ORAL_TABLET | Freq: Every day | ORAL | 3 refills | Status: DC

## 2023-11-22 NOTE — Patient Instructions (Signed)
 Medication Instructions:  Please DECREASE your eplerenone  to 25 mg daily.   *If you need a refill on your cardiac medications before your next appointment, please call your pharmacy*  Lab Work: None.  If you have labs (blood work) drawn today and your tests are completely normal, you will receive your results only by: MyChart Message (if you have MyChart) OR A paper copy in the mail If you have any lab test that is abnormal or we need to change your treatment, we will call you to review the results.  Testing/Procedures: None.  Follow-Up: At [facility], you and your health needs are our priority.  As part of our continuing mission to provide you with exceptional heart care, our providers are all part of one team.  This team includes your primary Cardiologist (physician) and Advanced Practice Providers or APPs (Physician Assistants and Nurse Practitioners) who all work together to provide you with the care you need, when you need it.  Your next appointment:   1 year(s)  Provider:   Gaylyn Keas, MD

## 2023-11-22 NOTE — Telephone Encounter (Signed)
 Spoke to patient ,as per Dr. Mitzie Anda  decreasing Eplerenone  to 25 mg daily

## 2023-11-22 NOTE — Telephone Encounter (Signed)
 Per Dr Micael Adas, order nasal pillow mask with chin strap   Upon patient request DME selection is [facility]. Patient understands he will be contacted by [facility] to set up his cpap. Patient understands to call if [facility] does not contact him with new setup in a timely manner. Patient understands they will be called once confirmation has been received from [facility]/ that they have received their new machine to schedule 10 week follow up appointment.   [facility] notified of new cpap order  Please add to airview Patient was grateful for the call and thanked me.

## 2023-11-22 NOTE — Progress Notes (Signed)
 Date:  11/22/2023   ID:  Joshua Moran., DOB [birth year], MRN [id number] The patient was identified using 2 identifiers.  PCP:  Jimmey Mould, MD   [facility] Providers Advanced Heart Failure: Peder Bourdon, MD  Electrophysiologist:  Boyce Byes, MD    Sleep Medicine:  Gaylyn Keas, MD  Evaluation Performed:  Follow-Up Visit  Chief Complaint:  OSA  History of Present Illness:    Joshua Moran. is a 78 y.o. male with a hx of coronary calcifications of the LM and RCA, PAF, HLD, GERD and retired Engineer, water for [employer].  He has never smoked but has been followed by Dr. Katheryne Pane in the past for DOE felt related to poor exercise tolerance from inactivity.  Stress myoview  in 2016 was normal and 2D echo in 2019 showed normal LVF with G1DD and mild pulmonary HTN.  He saw Humphrey Magnuson in 07/2018 and was noted to be in afib by EKG but not started on DOAC as CHADS2VASC score he felt was 1.  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  He is followed by EP for his permanent atrial fibrillation and CRT-P.  A sleep study was ordered which showed moderate OSA with an AHI of 26/hr and was started on auto CPAP from 4-20cm H2O.   He is doing well with his PAP device and thinks that he has gotten used to it.  He tolerates the full face mask and feels the pressure is adequate.  He does not wake up gasping for breath or snoring. He does not feel rested when he gets up in the am but has never noticed a difference.  He gets up 3-4 times nightly to urinate and then gets up to let the dogs out.  He only uses his CPAP half the night. He denies any significant mouth or nasal dryness or nasal congestion.  He does not think that he snores.      Past Medical History:  Diagnosis Date   Abnormal screening CT of chest    coronary calcium  in left main and RCA   Anemia    Arthritis    CHF (congestive heart failure) (HCC)    Complication of anesthesia    Pt reports difficult urinating    Coronary artery calcification seen on CAT scan    no ischemic on nuclear stress test 09/2019   DCM (dilated cardiomyopathy) (HCC)    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  Cardiac MRI 2021 with EF 39% and moderate RV dysfunction and non-compaction   Dyspnea    with some activity   GERD (gastroesophageal reflux disease)    occasional   Glaucoma    Hyperlipidemia    statin intolerant at doses > Crestor  5mg  3 times weekly   OSA on CPAP    PAF (paroxysmal atrial fibrillation) (HCC)    CHADS2VASC score 2 on apixaban    Past Surgical History:  Procedure Laterality Date   AV NODE ABLATION N/A 12/11/2020   Procedure: AV NODE ABLATION;  Surgeon: Boyce Byes, MD;  Location: [facility] INVASIVE CV LAB;  Service: Cardiovascular;  Laterality: N/A;   BIV PACEMAKER INSERTION CRT-P N/A 12/11/2020   Procedure: BIV PACEMAKER INSERTION CRT-P;  Surgeon: Boyce Byes, MD;  Location: [facility] INVASIVE CV LAB;  Service: Cardiovascular;  Laterality: N/A;   CARDIOVERSION N/A 04/01/2020   Procedure: CARDIOVERSION;  Surgeon: Maudine Sos, MD;  Location: [facility] ENDOSCOPY;  Service: Cardiovascular;  Laterality: N/A;  CARDIOVERSION N/A 09/08/2020   Procedure: CARDIOVERSION;  Surgeon: Hazle Lites, MD;  Location: [facility] ENDOSCOPY;  Service: Cardiovascular;  Laterality: N/A;   EYE SURGERY     bilateral cataracts   KNEE ARTHROSCOPY W/ DEBRIDEMENT     left    RETINAL DETACHMENT SURGERY Bilateral    RIGHT/LEFT HEART CATH AND CORONARY ANGIOGRAPHY N/A 05/06/2020   Procedure: RIGHT/LEFT HEART CATH AND CORONARY ANGIOGRAPHY;  Surgeon: Darlis Eisenmenger, MD;  Location: [facility] INVASIVE CV LAB;  Service: Cardiovascular;  Laterality: N/A;   TOTAL KNEE ARTHROPLASTY Left 01/01/2018   Procedure: LEFT TOTAL KNEE ARTHROPLASTY;  Surgeon: Liliane Rei, MD;  Location: [facility] ORS;  Service: Orthopedics;  Laterality: Left;  50 mins   TOTAL KNEE ARTHROPLASTY Right 06/25/2018   Procedure: TOTAL KNEE ARTHROPLASTY;  Surgeon: Liliane Rei, MD;  Location:  [facility] ORS;  Service: Orthopedics;  Laterality: Right;    VASECTOMY       No outpatient medications have been marked as taking for the 11/22/23 encounter (Office Visit) with Jacqueline Matsu, MD.     Allergies:   Xarelto  [rivaroxaban ], Empagliflozin , Spironolactone , Penicillins, Statins, and Vancomycin    Social History   Tobacco Use   Smoking status: Never   Smokeless tobacco: Never  Vaping Use   Vaping status: Never Used  Substance Use Topics   Alcohol use: Yes    Alcohol/week: 1.0 standard drink of alcohol    Types: 1 Cans of beer per week    Comment: Rare   Drug use: No     Family Hx: The patient's family history includes Lung cancer in his mother.  ROS:   Please see the history of present illness.     All other systems reviewed and are negative.   Prior Sleep studies:   The following studies were reviewed today:  PAP  compliance download  Labs/Other Tests and Data Reviewed:     Recent Labs: 01/10/2023: ALT 18 05/10/2023: Hemoglobin 13.2; Platelets 227 11/13/2023: B Natriuretic Peptide 128.2; BUN 14; Creatinine, Ser 1.05; Potassium 4.6; Sodium 138   Wt Readings from Last 3 Encounters:  11/22/23 287 lb (130.2 kg)  11/13/23 290 lb 6.4 oz (131.7 kg)  08/29/23 284 lb (128.8 kg)     Objective:    Vital Signs:  BP (!) 84/58 (BP Location: Right Arm)   Pulse 73   Ht 5' 11 (1.803 m)   Wt 287 lb (130.2 kg)   SpO2 95%   BMI 40.03 kg/m   GEN: Well nourished, well developed in no acute distress HEENT: Normal NECK: No JVD; No carotid bruits LYMPHATICS: No lymphadenopathy CARDIAC:RRR, no murmurs, rubs, gallops RESPIRATORY:  Clear to auscultation without rales, wheezing or rhonchi  ABDOMEN: Soft, non-tender, non-distended MUSCULOSKELETAL:  No edema; No deformity  SKIN: Warm and dry NEUROLOGIC:  Alert and oriented x 3 PSYCHIATRIC:  Normal affect  ASSESSMENT & PLAN:    OSA - The patient is tolerating PAP therapy well without any problems. The PAP download  performed by his DME was personally reviewed and interpreted by me today and showed an AHI of 1.4/hr on auto CPAP from 4 to 20 cm H2O with 23% compliance in using more than 4 hours nightly.  The patient has been using and benefiting from PAP use and will continue to benefit from therapy.  - I encouraged him to be more compliant with his device -he cannot use the FFM all night because it presses against his teeth. He is a mouth breather so have not tried a  nasal pillow mask.  -I have recommended trying a nasal pillow mask with chin strap to see if he could tolerate it all night  Orthostatic hypotension - Systolic BP low today in the mid 80s. - Recently started on Jardiance  10 mg daily by advanced heart failure clinic as well as an increase in eplerenone  to 50 mg nightly which is likely resulted in hypotension - Discussed medications with Dr. Mitzie Anda with advanced heart failure and will decrease Eplerenone  back to 25mg  daily starting tomrrow. -patient instructed to check BP twice daily at home and if SBP<45mmHg he needs to let Dr. Mitzie Anda know   Medication Adjustments/Labs and Tests Ordered: Current medicines are reviewed at length with the patient today.  Concerns regarding medicines are outlined above.   Tests Ordered: No orders of the defined types were placed in this encounter.   Medication Changes: No orders of the defined types were placed in this encounter.   Follow Up:  In Person in 1 year(s)  Signed, Gaylyn Keas, MD  11/22/2023 2:23 PM    [facility]

## 2023-11-22 NOTE — Addendum Note (Signed)
 Addended by: Cherylyn Cos on: 11/22/2023 02:50 PM   Modules accepted: Orders

## 2023-11-23 ENCOUNTER — Ambulatory Visit (HOSPITAL_COMMUNITY)
Admission: RE | Admit: 2023-11-23 | Discharge: 2023-11-23 | Disposition: A | Discharge status: Home / Self Care | Source: Ambulatory Visit | Attending: Cardiology | Admitting: Cardiology

## 2023-11-23 DIAGNOSIS — I5022 Chronic systolic (congestive) heart failure: Secondary | ICD-10-CM | POA: Insufficient documentation

## 2023-11-23 LAB — BASIC METABOLIC PANEL WITH GFR
Anion gap: 9 (ref 5–15)
BUN: 15 mg/dL (ref 8–23)
CO2: 26 mmol/L (ref 22–32)
Calcium: 9.1 mg/dL (ref 8.9–10.3)
Chloride: 107 mmol/L (ref 98–111)
Creatinine, Ser: 1.13 mg/dL (ref 0.61–1.24)
GFR, Estimated: >60 mL/min (ref >60)
Glucose, Bld: 92 mg/dL (ref 70–99)
Potassium: 4.8 mmol/L (ref 3.5–5.1)
Sodium: 142 mmol/L (ref 135–145)

## 2023-11-28 DIAGNOSIS — G4733 Obstructive sleep apnea (adult) (pediatric): Secondary | ICD-10-CM | POA: Diagnosis not present

## 2023-12-01 DIAGNOSIS — M25551 Pain in right hip: Secondary | ICD-10-CM | POA: Diagnosis not present

## 2023-12-11 ENCOUNTER — Ambulatory Visit: Payer: PPO

## 2023-12-11 DIAGNOSIS — I5022 Chronic systolic (congestive) heart failure: Secondary | ICD-10-CM

## 2023-12-12 ENCOUNTER — Telehealth: Payer: Self-pay

## 2023-12-12 DIAGNOSIS — Z86018 Personal history of other benign neoplasm: Secondary | ICD-10-CM | POA: Diagnosis not present

## 2023-12-12 DIAGNOSIS — Z950 Presence of cardiac pacemaker: Secondary | ICD-10-CM | POA: Diagnosis not present

## 2023-12-12 DIAGNOSIS — I4891 Unspecified atrial fibrillation: Secondary | ICD-10-CM | POA: Diagnosis not present

## 2023-12-12 DIAGNOSIS — Z7901 Long term (current) use of anticoagulants: Secondary | ICD-10-CM | POA: Diagnosis not present

## 2023-12-12 LAB — CUP PACEART REMOTE DEVICE CHECK
Battery Remaining Longevity: 69 mo
Battery Remaining Percentage: 69 %
Battery Voltage: 3.01 V
Date Time Interrogation Session: 20250707021311
Implantable Lead Connection Status: 753985
Implantable Lead Connection Status: 753985
Implantable Lead Implant Date: 20220708
Implantable Lead Implant Date: 20220708
Implantable Lead Location: 753858
Implantable Lead Location: 753860
Implantable Pulse Generator Implant Date: 20220708
Lead Channel Impedance Value: 1175 Ohm
Lead Channel Impedance Value: 580 Ohm
Lead Channel Pacing Threshold Amplitude: 0.75 V
Lead Channel Pacing Threshold Amplitude: 1 V
Lead Channel Pacing Threshold Pulse Width: 0.5 ms
Lead Channel Pacing Threshold Pulse Width: 0.5 ms
Lead Channel Sensing Intrinsic Amplitude: 10 mV
Lead Channel Setting Pacing Amplitude: 2.5 V
Lead Channel Setting Pacing Amplitude: 2.5 V
Lead Channel Setting Pacing Pulse Width: 0.5 ms
Lead Channel Setting Pacing Pulse Width: 0.5 ms
Lead Channel Setting Sensing Sensitivity: 4 mV
Pulse Gen Model: 3562
Pulse Gen Serial Number: 3913632

## 2023-12-12 NOTE — Telephone Encounter (Signed)
   Name: Joshua Moran.  DOB: 01/02/1946  MRN: [id number]  Primary Cardiologist: Wilbert Bihari, MD  Chart reviewed as part of pre-operative protocol coverage. The patient has an upcoming visit scheduled with advanced heart failure clinic on 12/13/2023 at which time clearance can be addressed in case there are any issues that would impact surgical recommendations.  Colonoscopy is not scheduled until 01/16/2024 as below. I added preop FYI to appointment note so that provider is aware to address at time of outpatient visit.  Per office protocol the cardiology provider should forward their finalized clearance decision and recommendations regarding antiplatelet therapy to the requesting party below.    This message will also be routed to pharmacy pool for input on holding Eliquis  as requested below so that this information is available to the clearing provider at time of patient's appointment.   I will route this message as FYI to requesting party and remove this message from the preop box as separate preop APP input not needed at this time.   Please call with any questions.  Damien JAYSON Braver, NP  12/12/2023, 4:39 PM

## 2023-12-12 NOTE — Telephone Encounter (Signed)
   Pre-operative Risk Assessment    Patient Name: Joshua Moran.  DOB: 09/01/1945 MRN: [id number]   Date of last office visit: 11/22/23 Date of next office visit:    Request for Surgical Clearance    Procedure:  Colonoscopy   Date of Surgery:  Clearance 01/16/24                                 Surgeon:  Dr. Elsie Cree  Surgeon's Group or Practice Name:  [facility]  Phone number:  [phone] Fax number:  [phone]   Type of Clearance Requested:   - Medical  - Pharmacy:  Hold Apixaban  (Eliquis ) 2 days prior to procedure    Type of Anesthesia:  Propofol     Additional requests/questions:    Bonney Rebeca Blight   12/12/2023, 3:32 PM

## 2023-12-13 ENCOUNTER — Other Ambulatory Visit (HOSPITAL_COMMUNITY): Payer: Self-pay | Admitting: Family Medicine

## 2023-12-13 ENCOUNTER — Encounter (HOSPITAL_COMMUNITY): Payer: Self-pay

## 2023-12-13 ENCOUNTER — Ambulatory Visit (HOSPITAL_COMMUNITY)
Admission: RE | Admit: 2023-12-13 | Discharge: 2023-12-13 | Disposition: A | Discharge status: Home / Self Care | Source: Ambulatory Visit | Attending: Family Medicine | Admitting: Family Medicine

## 2023-12-13 ENCOUNTER — Ambulatory Visit (HOSPITAL_COMMUNITY): Payer: Self-pay | Admitting: Family Medicine

## 2023-12-13 VITALS — BP 138/78 | HR 70 | Ht 71.0 in | Wt 297.0 lb

## 2023-12-13 DIAGNOSIS — I424 Endocardial fibroelastosis: Secondary | ICD-10-CM | POA: Insufficient documentation

## 2023-12-13 DIAGNOSIS — Z7984 Long term (current) use of oral hypoglycemic drugs: Secondary | ICD-10-CM | POA: Diagnosis not present

## 2023-12-13 DIAGNOSIS — I951 Orthostatic hypotension: Secondary | ICD-10-CM

## 2023-12-13 DIAGNOSIS — Z860100 Personal history of colon polyps, unspecified: Secondary | ICD-10-CM | POA: Insufficient documentation

## 2023-12-13 DIAGNOSIS — I517 Cardiomegaly: Secondary | ICD-10-CM | POA: Diagnosis not present

## 2023-12-13 DIAGNOSIS — N529 Male erectile dysfunction, unspecified: Secondary | ICD-10-CM | POA: Insufficient documentation

## 2023-12-13 DIAGNOSIS — I5022 Chronic systolic (congestive) heart failure: Secondary | ICD-10-CM | POA: Insufficient documentation

## 2023-12-13 DIAGNOSIS — Z7901 Long term (current) use of anticoagulants: Secondary | ICD-10-CM | POA: Diagnosis not present

## 2023-12-13 DIAGNOSIS — Z79899 Other long term (current) drug therapy: Secondary | ICD-10-CM | POA: Diagnosis not present

## 2023-12-13 DIAGNOSIS — I4821 Permanent atrial fibrillation: Secondary | ICD-10-CM | POA: Diagnosis not present

## 2023-12-13 DIAGNOSIS — N401 Enlarged prostate with lower urinary tract symptoms: Secondary | ICD-10-CM | POA: Insufficient documentation

## 2023-12-13 DIAGNOSIS — G4733 Obstructive sleep apnea (adult) (pediatric): Secondary | ICD-10-CM | POA: Insufficient documentation

## 2023-12-13 DIAGNOSIS — D649 Anemia, unspecified: Secondary | ICD-10-CM | POA: Insufficient documentation

## 2023-12-13 DIAGNOSIS — I2510 Atherosclerotic heart disease of native coronary artery without angina pectoris: Secondary | ICD-10-CM | POA: Insufficient documentation

## 2023-12-13 DIAGNOSIS — Z01818 Encounter for other preprocedural examination: Secondary | ICD-10-CM | POA: Diagnosis not present

## 2023-12-13 LAB — BRAIN NATRIURETIC PEPTIDE: B Natriuretic Peptide: 124.2 pg/mL — ABNORMAL HIGH (ref 0.0–100.0)

## 2023-12-13 LAB — BASIC METABOLIC PANEL WITH GFR
Anion gap: 7 (ref 5–15)
BUN: 14 mg/dL (ref 8–23)
CO2: 23 mmol/L (ref 22–32)
Calcium: 8.8 mg/dL — ABNORMAL LOW (ref 8.9–10.3)
Chloride: 108 mmol/L (ref 98–111)
Creatinine, Ser: 0.92 mg/dL (ref 0.61–1.24)
GFR, Estimated: >60 mL/min (ref >60)
Glucose, Bld: 102 mg/dL — ABNORMAL HIGH (ref 70–99)
Potassium: 4.1 mmol/L (ref 3.5–5.1)
Sodium: 138 mmol/L (ref 135–145)

## 2023-12-13 MED ORDER — FUROSEMIDE 40 MG PO TABS: 40.0000 mg | ORAL_TABLET | Freq: Every day | ORAL | 6 refills | Status: DC

## 2023-12-13 MED ORDER — POTASSIUM CHLORIDE CRYS ER 20 MEQ PO TBCR: 20.0000 meq | EXTENDED_RELEASE_TABLET | Freq: Every day | ORAL | 3 refills | Status: DC

## 2023-12-13 NOTE — Patient Instructions (Signed)
 Medication Changes:  CHANGE Furosemide  to 40 mg Daily  START Potassium 20 meq Daily  Lab Work:  Labs done today, your results will be available in MyChart, we will contact you for abnormal readings.  Your physician recommends that you return for lab work in: 1-2 weeks   Special Instructions // Education:  Do the following things EVERYDAY: Weigh yourself in the morning before breakfast. Write it down and keep it in a log. Take your medicines as prescribed Eat low salt foods--Limit salt (sodium) to 2000 mg per day.  Stay as active as you can everyday Limit all fluids for the day to less than 2 liters   Please wear your compression hose daily, place them on as soon as you get up in the morning and remove before you go to bed at night.   Follow-Up in: 3 months with Dr Rolan (October), **PLEASE CALL OUR OFFICE IN SEPTEMBER TO SCHEDULE THIS APPOINTMENT   At the Advanced Heart Failure Clinic, you and your health needs are our priority. We have a designated team specialized in the treatment of Heart Failure. This Care Team includes your primary Heart Failure Specialized Cardiologist (physician), Advanced Practice Providers (APPs- Physician Assistants and Nurse Practitioners), and Pharmacist who all work together to provide you with the care you need, when you need it.   You may see any of the following providers on your designated Care Team at your next follow up:  Dr. Toribio Fuel Dr. Ezra Rolan Dr. Ria Commander Dr. Odis Brownie Greig Mosses, NP Caffie Shed, [place] [facility] [place], [place] Beckey Coe, NP [place] Lee, NP Tinnie Redman, PharmD   Please be sure to bring in all your medications bottles to every appointment.   Need to Contact Us :  If you have any questions or concerns before your next appointment please send us  a message through [place] or call our office at [phone].    TO LEAVE A MESSAGE FOR THE NURSE SELECT OPTION 2, PLEASE LEAVE A  MESSAGE INCLUDING: YOUR NAME DATE OF BIRTH CALL BACK NUMBER REASON FOR CALL**this is important as we prioritize the call backs  YOU WILL RECEIVE A CALL BACK THE SAME DAY AS LONG AS YOU CALL BEFORE 4:00 PM

## 2023-12-13 NOTE — Progress Notes (Signed)
 Advanced Heart Failure Clinic Progress Note    PCP: Okey Carlin Redbird, MD Cardiology: Dr. Shlomo HF Cardiology: Dr. Rolan  78 y.o. with history of chronic systolic CHF and paroxysmal atrial fibrillation was referred by Dr. Shlomo for evaluation of CHF.  Patient has been short of breath for years, worse over the last 6 months.  He has had an extensive workup.  Echo in 3/21 showed EF 45-50% with mildly decreased RV function.  Cardiolite  in 4/21 showed fixed apical defect.  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  PYP scan in 8/21 was not suggestive of TTR cardiac amyloidosis.   He had been in persistent atrial fibrillation for about 2 years.  He saw Dr. Cindie and was started on amiodarone .  DCCV was done in 10/21 back to NSR.  He was found to have M-spike on SPEP.  He saw Dr. Timmy who thought this was MGUS.   LHC/RHC in 12/21 showed occluded small OM2 and 80-90% stenosis mid PLOM (small caliber), no interventional target; normal filling pressures and preserved cardiac output.   He was unable to tolerate Jardiance  due to extreme dizziness. He is off both Coreg  and Toprol  XL due to lightheadedness/hypotension.   He developed recurrent atrial fibrillation and failed DCCV in 4/22.  He has been in persistent atrial fibrillation, Dr Cindie stopped his amiodarone  due to futility.  In 7/22, he had AV nodal ablation with St Jude CRT-P device placement.   Echo in 5/22 showed EF 35-40%, mild LV enlargement, normal RV.  Echo in 5/23 showed EF 35-40%, diffuse hypokinesis. RV mildly reduced.   He has had issues recently tolerating GDMT due to low BP.   Echo (6/24) showed EF 50-55%, normal RV (poor images, may not be accurate EF).  Echo 6/25 EF 35-40% with diffuse hypokinesis, mild RV dilation/mild RV dysfunction, mild MR, severe biatrial enlargement.   Today he returns for HF follow up with his wife.  Overall feeling fine.  He is SOB walking further distances on flat ground. Just got back from beach. Took Lasix  every day while he was at the beach, then took 2 tablets for several days afterward as he had dietary indiscretion. Denies palpitations, abnormal bleeding, CP, dizziness, or PND/Orthopnea. Appetite ok. Weight at home 291 pounds. Taking all medications. Wears CPAP. BP at home averaging 90-120s/60-70s  St Jude device interrogation: 99% BiV paced, thoracic impedance recently decreased suggesting volume up  ECG (personally reviewed): AF with BiV pacing.   Labs (2/24): K 3.9, creatinine 0.93, LFTs normal, LDL 86 Labs (6/24): K 4.1, creatinine 0.95 Labs (8/24): K 4.2, creatinine 0.98, LDL 34 Labs (12/24): K 4.3, creatinine 0.89 Labs (6/25): K 4.8, creatinine 1.13, LDL 40  PMH: 1. CVA: Occurred in 3/21, he was not on apixaban  at the time.  2. GERD 3. OSA: Uses CPAP.  4. Hyperlipidemia 5. Atrial fibrillation: Permanent - DCCV to NSR in 10/21.  - Failed DCCV in 4/22, stopped amiodarone .  - AV nodal ablation with St Jude CRT-P in 7/22.  6. MGUS 7. Chronic systolic CHF: Echo (3/21) with EF 45-50%, mild LVH, mildly decreased RV systolic function. Primarily nonischemic cardiomyopathy.  - Cardiolite  (4/21): Fixed apical defect.  - Cardiac MRI (9/21): Mild LV dilation with mild LV hypertrophy, EF 39%, prominent trabeculations concerning for noncompaction. Mild RV dilation with EF 28%, severe LAE.  No delayed enhancement.  - PYP scan (8/21): grade 1, H/CL 1-1.5.  Unlikely to have cardiac amyloidosis.  - LHC/RHC (12/21): small OM2 totally occluded, 80-90% mid small PLOM (no interventional target); mean RA 5, PA 25/9, mean PCWP 5, CI 2.77 Fick/3.29 Thermo.  - St Jude CRT-P  - Echo (5/23): EF 35-40%, diffuse hypokinesis, mildly decreased RV systolic function, IVC normal.  - Echo (6/24): EF 50-55%, normal RV (poor images, ?accuracy). - Echo (6/25): EF 35-40% with diffuse hypokinesis, mild RV  dilation/mild RV dysfunction, mild MR, severe biatrial enlargement.  8. Orthostatic hypotension.  9. CAD: Coronary angiography 12/21 with small OM2 totally occluded, 80-90% mid small PLOM (no interventional target) 10. Carotid ultrasound (8/22): mild BICA stenosis.  11. COVID-19 9/22 12. PVCs  Social History   Socioeconomic History   Marital status: Married    Spouse name: Not on file   Number of children: Not on file   Years of education: Not on file   Highest education level: Not on file  Occupational History   Not on file  Tobacco Use   Smoking status: Never   Smokeless tobacco: Never  Vaping Use   Vaping status: Never Used  Substance and Sexual Activity   Alcohol use: Yes    Alcohol/week: 1.0 standard drink of alcohol    Types: 1 Cans of beer per week    Comment: Rare   Drug use: No   Sexual activity: Not Currently  Other Topics Concern   Not on file  Social History Narrative   Not on file   Social Drivers of Health   Financial Resource Strain: Not on file  Food Insecurity: Not on file  Transportation Needs: Not on file  Physical Activity: Not on file  Stress: Not on file  Social Connections: Not on file  Intimate Partner Violence: Not on file   Family History  Problem Relation Age of Onset   Lung cancer Mother        smoker   ROS: All systems reviewed and negative except as per HPI.   Current Outpatient Medications  Medication Sig Dispense Refill   acetaminophen  (TYLENOL ) 500 MG tablet 1,000 mg. Patient takes 2 tablets by mouth in the morning and night     apixaban  (ELIQUIS ) 5 MG TABS tablet Take 1 tablet (5 mg total) by mouth 2 (two) times daily. 60 tablet 0   cetirizine (ZYRTEC) 10 MG tablet Take 10 mg by mouth at bedtime.     Cholecalciferol (VITAMIN D3) 50 MCG (2000 UT) TABS Take 2,000 Units by mouth daily.     empagliflozin  (JARDIANCE ) 10 MG TABS tablet Take 1 tablet (10 mg total) by mouth daily before breakfast. 90 tablet 1   eplerenone  (INSPRA ) 50  MG tablet Take 0.5 tablets (25 mg total) by mouth at bedtime.     Evolocumab  (REPATHA  SURECLICK) 140 MG/ML SOAJ INJECT 140MG  INTO THE SKIN EVERY 2 WEEKS 6 mL 3   ezetimibe  (ZETIA ) 10 MG tablet TAKE 1 TABLET BY MOUTH EVERY DAY 90 tablet 2   ferrous sulfate  325 (65 FE) MG tablet Take 325 mg by mouth in the morning.     fluticasone  (FLONASE ) 50 MCG/ACT nasal spray Place 2 sprays into both nostrils 2 (two) times daily.      furosemide  (LASIX ) 20 MG tablet Take 1 tablet (20 mg total) by mouth daily as needed for fluid or edema. 90 tablet 2   Glucosamine Sulfate 750 MG TABS Take 750 mg by mouth in the morning and at bedtime.     ipratropium (ATROVENT) 0.06 % nasal spray SMARTSIG:2  Spray(s) Both Nares Twice Daily PRN     losartan  (COZAAR ) 25 MG tablet Take 0.5 tablets (12.5 mg total) by mouth at bedtime. 45 tablet 3   Melatonin 10 MG TABS Take 10 mg by mouth at bedtime.      Multiple Vitamin (MULTIVITAMIN WITH MINERALS) TABS tablet Take 1 tablet by mouth every evening.     pantoprazole  (PROTONIX ) 40 MG tablet Take 1 tablet (40 mg total) by mouth daily. 90 tablet 3   rosuvastatin  (CRESTOR ) 5 MG tablet 1 tablet Orally MWF for 90 days     sodium chloride  (OCEAN) 0.65 % SOLN nasal spray Place 1 spray into both nostrils as needed for congestion. 22.5 mL 0   tamsulosin  (FLOMAX ) 0.4 MG CAPS capsule Take 0.4 mg by mouth at bedtime.      Vericiguat  (VERQUVO ) 5 MG TABS TAKE 1 TABLET BY MOUTH EVERY DAY 90 tablet 3   No current facility-administered medications for this encounter.   BP 138/78   Pulse 70   Ht 5' 11 (1.803 m)   Wt 134.7 kg (297 lb)   SpO2 94%   BMI 41.42 kg/m   Wt Readings from Last 3 Encounters:  12/13/23 134.7 kg (297 lb)  11/22/23 130.2 kg (287 lb)  11/13/23 131.7 kg (290 lb 6.4 oz)   PHYSICAL EXAM: General:  NAD. No resp difficulty, walked into clinic HEENT: Normal Neck: Supple. No JVD. Cor: Regular rate & rhythm. No rubs, gallops or murmurs. Lungs: Clear Abdomen: Soft, obese,  nontender, nondistended.  Extremities: No cyanosis, clubbing, rash, 1+ BLE edema Neuro: Alert & oriented x 3, moves all 4 extremities w/o difficulty. Affect pleasant.  Assessment/Plan: 1. Chronic systolic CHF: Cardiac MRI in 9/21 with EF 39% and evidence for noncompaction cardiomyopathy, no delayed enhancement, significant RV dysfunction with RV EF 22%.  PYP scan was not suggestive of TTR amyloidosis and no symptoms or peripheral neuropathy.  12/21 LHC showed CAD but does not explain cardiomyopathy, no interventional target.  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  Echo 10/2021 showed  EF 35-40%, diffuse hypokinesis, mildly decreased RV systolic function, IVC normal. He has a history of orthostatic hypotension which has limited medication titration but this is doing better since AV nodal ablation with St Jude CRT-P.  Echo (6/24) showed EF 50-55%, normal RV. However, this was a difficult study, and echo 6/25 showing EF 35-40% with diffuse hypokinesis, mild RV dilation/mild RV dysfunction is more in line with prior echoes.  NYHA class IIb-III symptoms, he is mildly volume overloaded by exam and CorVue, likely due to dietary indiscretion during recent vacation. - Encouraged compression hose. - Increase Lasix  to 40 mg daily, add 20 KCL daily. BMET/BNP today, repeat BMET in 10-14 days. - Continue Jardiance  10 mg daily. No GU symptoms. - Continue Verquvo  5 mg daily.  - Continue eplerenone  25 mg qhs.   - Continue losartan  12.5 mg at bed time due to h/o orthostasis. Suspect BP will not tolerate transition to Entresto.  - Unable to tolerate Coreg  or Toprol  XL due to lightheadedness/low BP.  - Not candidate for cardiac contractility modulation due to CRT.  2. Atrial fibrillation: Permanent.  He has severe LAE. Not good candidate for atrial fibrillation ablation per EP.  He failed DCCV on amiodarone  in 4/22, amiodarone  stopped.  AV nodal ablation with St Jude CRT-P in 7/22. -  Continue apixaban  5 mg bid. No bleeding issues 3. OSA: Continue CPAP.  4. Orthostatic hypotension: Limits medications.   - Continue  compression stockings.  - Encouraged slow positional changes.   5. CAD: Moderate disease on cath in 12/21, no interventional target.  No chest pain.  Extent of disease does not explain cardiomyopathy.   - Continue Crestor  5 mg qod and Repatha .  - He is on apixaban  with stable CAD, so no ASA.  6. Pre-Procedure CV Risk stratification: he is scheduled for colonoscopy 01/16/24. Would like volume optimized prior to giving OK. Will have device RN send transmission in 1 week to follow volume, if improved, he is at acceptable risk for colonoscopy. Discussed this with patient today and he is in agreement. OK to hold Eliquis  2 days before procedure, resume as soon as safe to do so post procedure. - RCRI score = 1 point (6% risk of major cardiac event)  Follow up in 3 months with Dr. Rolan Harlene CHRISTELLA Glena, FNP-BC 12/13/2023

## 2023-12-13 NOTE — Telephone Encounter (Signed)
   Patient Name: Joshua Moran.  DOB: 1946/05/30 MRN: [id number]  Primary Cardiologist: Wilbert Bihari, MD  Chart reviewed as part of pre-operative protocol coverage. Given past medical history and time since last visit, based on ACC/AHA guidelines, Joshua Moran. is at acceptable risk for the planned procedure without further cardiovascular testing.   Per [name], NP 12/13/2023  Pre-Procedure CV Risk stratification: he is scheduled for colonoscopy 01/16/24. Would like volume optimized prior to giving OK. Will have device RN send transmission in 1 week to follow volume, if improved, he is at acceptable risk for colonoscopy. Discussed this with patient today and he is in agreement. OK to hold Eliquis  2 days before procedure, resume as soon as safe to do so post procedure. - RCRI score = 1 point (6% risk of major cardiac event)  The patient was advised that if he develops new symptoms prior to surgery to contact our office to arrange for a follow-up visit, and he verbalized understanding.  I will route this recommendation to the requesting party via Epic fax function and remove from pre-op  pool.  Please call with questions.  Lamarr Satterfield, NP 12/13/2023, 2:23 PM

## 2023-12-13 NOTE — Telephone Encounter (Signed)
 Patient with diagnosis of A Fib on Eliquis  for anticoagulation.    Procedure: colonoscopy Date of procedure: 01/16/24   CHA2DS2-VASc Score = 6  This indicates a 9.7% annual risk of stroke. The patient's score is based upon: CHF History: 1 HTN History: 0 Diabetes History: 0 Stroke History: 2 Vascular Disease History: 1 Age Score: 2 Gender Score: 0    CrCl 91 ml/min using adj body weight Platelet count 227K   Per office protocol, patient can hold Eliquis  for 2 days prior to procedure.    **This guidance is not considered finalized until pre-operative APP has relayed final recommendations.**

## 2023-12-14 DIAGNOSIS — M47816 Spondylosis without myelopathy or radiculopathy, lumbar region: Secondary | ICD-10-CM | POA: Diagnosis not present

## 2023-12-16 ENCOUNTER — Ambulatory Visit: Payer: Self-pay | Admitting: Cardiology

## 2023-12-18 ENCOUNTER — Ambulatory Visit: Attending: Cardiology

## 2023-12-18 ENCOUNTER — Telehealth: Payer: Self-pay

## 2023-12-18 DIAGNOSIS — I5022 Chronic systolic (congestive) heart failure: Secondary | ICD-10-CM

## 2023-12-18 DIAGNOSIS — Z950 Presence of cardiac pacemaker: Secondary | ICD-10-CM

## 2023-12-18 NOTE — Progress Notes (Signed)
 [place], Harlene HERO, FNP  Keimon Basaldua, Mitzie RAMAN, RN No change to plan, thanks!

## 2023-12-18 NOTE — Telephone Encounter (Signed)
 ICM call to patient and assisted with sending remote transmission.   See ICM Note for results.

## 2023-12-18 NOTE — Progress Notes (Signed)
 EPIC Encounter for ICM Monitoring  Patient Name: Joshua Moran. is a 78 y.o. male Date: 12/18/2023 Primary Care Physican: Okey Carlin Redbird, MD Primary Cardiologist: Rolan Electrophysiologist: Cindie BiV Pacing: >99% 10/19/2022 Weight: 279 lbs (276 lbs) 07/13/2023 Weight: 282 lbs     08/16/2023 Weight: 284 lbs   09/18/2023 Weight: 283 lbs   12/18/2023 Weight: 282.7 lbs  (was 295.2 lbs)                                           ICM Call to patient to recheck fluid levels per request of HF clinic following 7/9 OV.   Pt is feeling well.  He has been taking Lasix  40 mg daily as directed by [name], NP since 7/9 OV.  Home Weight decreased approximately 13 lbs since 7/9 OV (was on vacation prior to 7/9 OV).      CorVue thoracic impedance suggesting fluid levels returned to normal after Lasix  increased to 40 mg daily on 7/9.   Prescribed:  Furosemide  20 mg take 2 tablet(s) (40 mg total) by mouth daily.   Potassium 20 mEq take 1 tablet (s) (20 mEq Total) by mouth daily    Labs: 12/27/2023 BMET scheduled at HF clinic 12/13/2023 Creatinine 0.92, BUN 14, Potassium 4.1, Sodium 138, GFR >60, BNP 124.2 11/23/2023 Creatinine 1.13, BUN 15. Potassium 4.8, Sodium 142, GFR >60 11/13/2023 BNP 128.2 A complete set of results can be found in Results Review.  Recommendations:  Copy sent to [name], NP as requested for review.  Advised to continue with Lasix  40 mg daily and will call back if any recommendations.     Follow-up plan: ICM clinic phone appointment on 12/25/2023.   91 day device clinic remote transmission 03/11/2024.     EP/Cardiology Office Visits:  11/13/2023 with Dr Rolan with echo.  Recall 04/18/2024 with Dr Cindie or Jodie Passey, PA.    Copy of ICM check sent to Dr. Cindie.  3 month ICM trend: 12/18/2023.    12-14 Month ICM trend:     [name], RN 12/18/2023 2:42 PM

## 2023-12-25 ENCOUNTER — Ambulatory Visit: Attending: Cardiology

## 2023-12-25 DIAGNOSIS — Z950 Presence of cardiac pacemaker: Secondary | ICD-10-CM | POA: Diagnosis not present

## 2023-12-25 DIAGNOSIS — I5022 Chronic systolic (congestive) heart failure: Secondary | ICD-10-CM | POA: Diagnosis not present

## 2023-12-26 ENCOUNTER — Telehealth: Payer: Self-pay | Admitting: Internal Medicine

## 2023-12-26 ENCOUNTER — Ambulatory Visit (HOSPITAL_COMMUNITY)
Admission: RE | Admit: 2023-12-26 | Discharge: 2023-12-26 | Disposition: A | Discharge status: Home / Self Care | Source: Ambulatory Visit | Attending: Cardiology | Admitting: Cardiology

## 2023-12-26 DIAGNOSIS — I5022 Chronic systolic (congestive) heart failure: Secondary | ICD-10-CM | POA: Diagnosis not present

## 2023-12-26 LAB — BASIC METABOLIC PANEL WITH GFR
Anion gap: 12 (ref 5–15)
BUN: 16 mg/dL (ref 8–23)
CO2: 23 mmol/L (ref 22–32)
Calcium: 8.9 mg/dL (ref 8.9–10.3)
Chloride: 104 mmol/L (ref 98–111)
Creatinine, Ser: 1.05 mg/dL (ref 0.61–1.24)
GFR, Estimated: >60 mL/min (ref >60)
Glucose, Bld: 132 mg/dL — ABNORMAL HIGH (ref 70–99)
Potassium: 4.6 mmol/L (ref 3.5–5.1)
Sodium: 139 mmol/L (ref 135–145)

## 2023-12-26 NOTE — Telephone Encounter (Signed)
 Copied from CRM [id number]. Topic: Appointments - Appointment Scheduling >> Dec 26, 2023 12:59 PM Rilla B wrote: Patient is schedule in [place] with Dr Darlean on 8/26. Patient received a message that an appt was available sooner and he would like to take that appt. Unable to schedule as I do not see an appt sooner. Patient prefers [place] but willing to keep [place].  Please call patient.  Spoke with patient to inform him the open slot for Dr. Darlean had been filled.  He voiced his understanding

## 2023-12-27 ENCOUNTER — Other Ambulatory Visit (HOSPITAL_COMMUNITY)

## 2023-12-27 MED ORDER — FUROSEMIDE 40 MG PO TABS: ORAL_TABLET | ORAL | 2 refills | Status: DC

## 2023-12-27 NOTE — Progress Notes (Signed)
  Received: Today [place], Harlene HERO, FNP  Nadira Single, Mitzie RAMAN, RN Lets drop Lasix  to 20 mg on MWF, other days keep at 40 mg (T/Th/Sat/Sun)

## 2023-12-27 NOTE — Progress Notes (Signed)
 EPIC Encounter for ICM Monitoring  Patient Name: Joshua Moran. is a 78 y.o. male Date: 12/27/2023 Primary Care Physican: Okey Carlin Redbird, MD Primary Cardiologist: Rolan Electrophysiologist: Cindie BiV Pacing: >99% 10/19/2022 Weight: 279 lbs (276 lbs) 07/13/2023 Weight: 282 lbs     08/16/2023 Weight: 284 lbs   09/18/2023 Weight: 283 lbs   12/18/2023 Weight: 282.7 lbs  (was 295.2 lbs)    12/27/2023 Weight: 279.7 lbs                                        Spoke with patient and heart failure questions reviewed.  Transmission results reviewed.  Pt asymptomatic for fluid accumulation.  He reports being consistent with fluid intake and no changes since last week.   He reports chronic morning dizziness but does feel it is a little worse since lasix  was increased to 40 mg at 7/9 OV.        CorVue thoracic impedance suggesting possible dryness starting 7/14 (Lasix  increased to 40 mg daily on 7/9).   Prescribed:  Furosemide  20 mg take 2 tablet(s) (40 mg total) by mouth daily (increased 12/13/23).   Potassium 20 mEq take 1 tablet (s) (20 mEq Total) by mouth daily    Labs: 12/26/2023 Creatinine 1.05, BUN 16, Potassium 4.6, Sodium 139, GFR >60 12/13/2023 Creatinine 0.92, BUN 14, Potassium 4.1, Sodium 138, GFR >60, BNP 124.2 11/23/2023 Creatinine 1.13, BUN 15. Potassium 4.8, Sodium 142, GFR >60 11/13/2023 BNP 128.2 A complete set of results can be found in Results Review.   Recommendations:  Copy sent to [name], NP for review and recommendations if needed.  Advised to continue with Lasix  40 mg daily and will call back if any recommendations.     Follow-up plan: ICM clinic phone appointment on 01/01/2024 to recheck fluid levels.   91 day device clinic remote transmission 03/11/2024.     EP/Cardiology Office Visits:  Recall 03/12/2024 with HF clinic.  Recall 03/18/2024 with Dr Cindie or Jodie Passey, PA.    Copy of ICM check sent to Dr. Cindie.  3 month ICM trend:  12/25/2023.    12-14 Month ICM trend:     [name], RN 12/27/2023 1:41 PM

## 2023-12-27 NOTE — Progress Notes (Signed)
 Spoke with patient.  Advised [name], NP at [facility] clinic recommended to change Furosemide  20 mg 1 tablet every Mon, Wed and Friday.  Take 2 tablets (40 mg total) every Tues, Thurs, Sat and Sun. Does not need refill at this time.  Epic script updated.    Pt verbalized understanding and agreed with plan.   Advised to call back for any questions.

## 2024-01-01 ENCOUNTER — Ambulatory Visit: Attending: Cardiology

## 2024-01-01 DIAGNOSIS — I5022 Chronic systolic (congestive) heart failure: Secondary | ICD-10-CM

## 2024-01-01 DIAGNOSIS — Z950 Presence of cardiac pacemaker: Secondary | ICD-10-CM

## 2024-01-04 NOTE — Progress Notes (Signed)
 EPIC Encounter for ICM Monitoring  Patient Name: Joshua Moran. is a 78 y.o. male Date: 01/04/2024 Primary Care Physican: Okey Carlin Redbird, MD Primary Cardiologist: Rolan Electrophysiologist: Cindie BiV Pacing: >99% 10/19/2022 Weight: 279 lbs (276 lbs) 07/13/2023 Weight: 282 lbs     08/16/2023 Weight: 284 lbs   09/18/2023 Weight: 283 lbs   12/18/2023 Weight: 282.7 lbs  (was 295.2 lbs)    12/27/2023 Weight: 279.7 lbs    01/04/2024 Weight: 279 lbs                                     Spoke with patient and heart failure questions reviewed.  Transmission results reviewed.  Pt asymptomatic for fluid accumulation. Weight fluctuates 2-3 lbs depending on what he eats.    Diet:  He eats at restaurants several times a week and aware the foods are higher in salt.    He reports chronic dizziness most days but BP is usually not very low.     CorVue thoracic impedance suggesting fluid levels returned to normal on new Lasix  dosage of different dosages on certain days but fluid accumulation starting to return on 7/27.   Prescribed:  Furosemide  20 mg take 2 tablet(s) (40 mg total) by mouth every Tues, Thurs, Sat and Sun.  Take 1 tablet (20 mg total) every Mon, Wed and Friday.   Dose change 7/23. Potassium 20 mEq take 1 tablet (s) (20 mEq Total) by mouth daily    Labs: 12/26/2023 Creatinine 1.05, BUN 16, Potassium 4.6, Sodium 139, GFR >60 12/13/2023 Creatinine 0.92, BUN 14, Potassium 4.1, Sodium 138, GFR >60, BNP 124.2 11/23/2023 Creatinine 1.13, BUN 15. Potassium 4.8, Sodium 142, GFR >60 11/13/2023 BNP 128.2 A complete set of results can be found in Results Review.   Recommendations:  Advised to limit salt if possible.  Will recheck fluid levels on the current Lasix  dosage.       Follow-up plan: ICM clinic phone appointment on 01/08/2024 to recheck fluid levels.   91 day device clinic remote transmission 03/11/2024.     EP/Cardiology Office Visits:  Recall 03/12/2024 with HF clinic.  Recall  03/18/2024 with Dr Cindie or Jodie Passey, PA.    Copy of ICM check sent to Dr. Cindie.  3 month ICM trend: 01/01/2024.    12-14 Month ICM trend:     [name], RN 01/04/2024 5:06 PM

## 2024-01-08 ENCOUNTER — Ambulatory Visit: Attending: Cardiology

## 2024-01-08 DIAGNOSIS — Z950 Presence of cardiac pacemaker: Secondary | ICD-10-CM

## 2024-01-08 DIAGNOSIS — I5022 Chronic systolic (congestive) heart failure: Secondary | ICD-10-CM

## 2024-01-09 ENCOUNTER — Encounter (HOSPITAL_COMMUNITY): Payer: Self-pay

## 2024-01-09 NOTE — Progress Notes (Signed)
 EPIC Encounter for ICM Monitoring  Patient Name: Joshua Moran. is a 78 y.o. male Date: 01/09/2024 Primary Care Physican: Okey Carlin Redbird, MD Primary Cardiologist: Rolan Electrophysiologist: Cindie BiV Pacing: >99% 10/19/2022 Weight: 279 lbs (276 lbs) 07/13/2023 Weight: 282 lbs     08/16/2023 Weight: 284 lbs   09/18/2023 Weight: 283 lbs   12/18/2023 Weight: 282.7 lbs  (was 295.2 lbs)    12/27/2023 Weight: 279.7 lbs    01/04/2024 Weight: 279 lbs                                     Spoke with patient and heart failure questions reviewed.  Transmission results reviewed.  Pt asymptomatic for fluid accumulation. He reports dizziness has increased over the last couple of weeks and BP is a little lower than unsusal.   Diet:  He eats at restaurants several times a week and aware the foods are higher in salt.     CorVue thoracic impedance suggesting fluid levels continue to be normal since 7/24.   Prescribed:  Furosemide  20 mg take 2 tablet(s) (40 mg total) by mouth every Tues, Thurs, Sat and Sun.  Take 1 tablet (20 mg total) every Mon, Wed and Friday.   Dose change 7/23. Potassium 20 mEq take 1 tablet (s) (20 mEq Total) by mouth daily    Labs: 12/26/2023 Creatinine 1.05, BUN 16, Potassium 4.6, Sodium 139, GFR >60 12/13/2023 Creatinine 0.92, BUN 14, Potassium 4.1, Sodium 138, GFR >60, BNP 124.2 11/23/2023 Creatinine 1.13, BUN 15. Potassium 4.8, Sodium 142, GFR >60 11/13/2023 BNP 128.2 A complete set of results can be found in Results Review.   Recommendations:   Advised to contact HF clinic regarding increase in dizziness and low BP for recommendations.      Follow-up plan: ICM clinic phone appointment on 01/29/2024.   91 day device clinic remote transmission 03/11/2024.     EP/Cardiology Office Visits:  Recall 03/12/2024 with HF clinic.  Recall 03/18/2024 with Dr Cindie or Jodie Passey, PA.    Copy of ICM check sent to Dr. Cindie.  3 month ICM trend: 01/08/2024.    12-14 Month ICM  trend:     [name], RN 01/09/2024 2:24 PM

## 2024-01-12 ENCOUNTER — Encounter: Payer: Self-pay | Admitting: Cardiology

## 2024-01-16 DIAGNOSIS — Z860101 Personal history of adenomatous and serrated colon polyps: Secondary | ICD-10-CM | POA: Diagnosis not present

## 2024-01-16 DIAGNOSIS — D123 Benign neoplasm of transverse colon: Secondary | ICD-10-CM | POA: Diagnosis not present

## 2024-01-16 DIAGNOSIS — Z09 Encounter for follow-up examination after completed treatment for conditions other than malignant neoplasm: Secondary | ICD-10-CM | POA: Diagnosis not present

## 2024-01-16 DIAGNOSIS — K648 Other hemorrhoids: Secondary | ICD-10-CM | POA: Diagnosis not present

## 2024-01-16 DIAGNOSIS — K5730 Diverticulosis of large intestine without perforation or abscess without bleeding: Secondary | ICD-10-CM | POA: Diagnosis not present

## 2024-01-18 DIAGNOSIS — D123 Benign neoplasm of transverse colon: Secondary | ICD-10-CM | POA: Diagnosis not present

## 2024-01-25 DIAGNOSIS — M47816 Spondylosis without myelopathy or radiculopathy, lumbar region: Secondary | ICD-10-CM | POA: Diagnosis not present

## 2024-01-26 DIAGNOSIS — Z09 Encounter for follow-up examination after completed treatment for conditions other than malignant neoplasm: Secondary | ICD-10-CM | POA: Diagnosis not present

## 2024-01-26 DIAGNOSIS — L218 Other seborrheic dermatitis: Secondary | ICD-10-CM | POA: Diagnosis not present

## 2024-01-26 DIAGNOSIS — L814 Other melanin hyperpigmentation: Secondary | ICD-10-CM | POA: Diagnosis not present

## 2024-01-26 DIAGNOSIS — D225 Melanocytic nevi of trunk: Secondary | ICD-10-CM | POA: Diagnosis not present

## 2024-01-26 DIAGNOSIS — L570 Actinic keratosis: Secondary | ICD-10-CM | POA: Diagnosis not present

## 2024-01-26 DIAGNOSIS — L821 Other seborrheic keratosis: Secondary | ICD-10-CM | POA: Diagnosis not present

## 2024-01-26 DIAGNOSIS — Z85828 Personal history of other malignant neoplasm of skin: Secondary | ICD-10-CM | POA: Diagnosis not present

## 2024-01-26 DIAGNOSIS — L728 Other follicular cysts of the skin and subcutaneous tissue: Secondary | ICD-10-CM | POA: Diagnosis not present

## 2024-01-26 DIAGNOSIS — Z08 Encounter for follow-up examination after completed treatment for malignant neoplasm: Secondary | ICD-10-CM | POA: Diagnosis not present

## 2024-01-28 NOTE — Progress Notes (Unsigned)
 Joshua ONEIDA Lawernce Mickey., male    DOB: Oct 17, 1945    MRN: [id number]   Brief patient profile:  62  yowm never smoker  with onset around  2015 > eval by ENT= newman nasal polyps did not require surgeyr then [facility] rec nasal sprays including flonase / atrovent some better pnds but referred to pulmonary clinic in [place]  01/30/2024 by Dale Gull  for flare of cough p covid 2021 then worse since Winter of 2025 > abx ? Which one seemed to help the most.  Seen last by PCCM 2019 with Echo c/w LVH/ diastolic dysfunction / pfts nl   PFT 06/18/20  2022 nl x for erv 59% at wt 296   ECHO   11/23/23  mod reduced LV fnc with ef 35-40%  with LA severe / RA mod dilated    History of Present Illness  01/30/2024  Pulmonary - Re-establish / 1st office eval/ Nastacia Raybuck / [facility]  Chief Complaint  Patient presents with   Cough    PCP says pt has Mycobacterium avium complex and referred him here.  Chronic cough w white mucus  Dyspnea:  sedentary / no longer regular walking of any kind, prefers to let his wife do the shopping Cough: not present on awakening  though there is some nasal congestion and some sporadic during the day assoc with urge to clear to clear throat x 5-10  years >>> min  white mucus  Sleep: bed is flat/ 1 pillow immediately on lying down / mints seem to help transiently  SABA use: none  02: none     No obvious day to day or daytime pattern/variability or assoc  purulent sputum or mucus plugs or hemoptysis or cp or chest tightness, subjective wheeze or overt sinus or hb symptoms.    Also denies any obvious fluctuation of symptoms with weather or environmental changes or other aggravating or alleviating factors except as outlined above   No unusual exposure hx or h/o childhood pna/ asthma or knowledge of premature birth.  Current Allergies, Complete Past Medical History, Past Surgical History, Family History, and Social History were reviewed in [employer]  record.  ROS  The following are not active complaints unless bolded Hoarseness, sore throat, dysphagia, dental problems, itching, sneezing,  nasal congestion or discharge of excess mucus or purulent secretions, ear ache,   fever, chills, sweats, unintended wt loss or wt gain, classically pleuritic or exertional cp,  orthopnea pnd or arm/hand swelling  or leg swelling, presyncope, palpitations, abdominal pain, anorexia, nausea, vomiting, diarrhea  or change in bowel habits or change in bladder habits, change in stools or change in urine, dysuria, hematuria,  rash, arthralgias, visual complaints, headache, numbness, weakness or ataxia or problems with walking or coordination,  change in mood or  memory.            Outpatient Medications Prior to Visit  Medication Sig Dispense Refill   acetaminophen  (TYLENOL ) 500 MG tablet 1,000 mg. Patient takes 2 tablets by mouth in the morning and night     apixaban  (ELIQUIS ) 5 MG TABS tablet Take 1 tablet (5 mg total) by mouth 2 (two) times daily. 60 tablet 0   cetirizine (ZYRTEC) 10 MG tablet Take 10 mg by mouth at bedtime.     Cholecalciferol (VITAMIN D3) 50 MCG (2000 UT) TABS Take 2,000 Units by mouth daily.     empagliflozin  (JARDIANCE ) 10 MG TABS tablet Take 1 tablet (10 mg total) by mouth daily before breakfast.  90 tablet 1   eplerenone  (INSPRA ) 50 MG tablet Take 0.5 tablets (25 mg total) by mouth at bedtime.     Evolocumab  (REPATHA  SURECLICK) 140 MG/ML SOAJ INJECT 140MG  INTO THE SKIN EVERY 2 WEEKS 6 mL 3   ezetimibe  (ZETIA ) 10 MG tablet TAKE 1 TABLET BY MOUTH EVERY DAY 90 tablet 2   ferrous sulfate  325 (65 FE) MG tablet Take 325 mg by mouth in the morning.     fluticasone  (FLONASE ) 50 MCG/ACT nasal spray Place 2 sprays into both nostrils 2 (two) times daily.      furosemide  (LASIX ) 40 MG tablet Take 1 tablet (20 mg total) every Mon, Wed and Friday.   Take 2 tablets (40 mg total) every Tues, Thurs, Sat and Sun. 132 tablet 2   Glucosamine Sulfate 750 MG  TABS Take 750 mg by mouth in the morning and at bedtime.     ipratropium (ATROVENT) 0.06 % nasal spray SMARTSIG:2 Spray(s) Both Nares Twice Daily PRN     losartan  (COZAAR ) 25 MG tablet Take 0.5 tablets (12.5 mg total) by mouth at bedtime. 45 tablet 3   Melatonin 10 MG TABS Take 10 mg by mouth at bedtime.      Multiple Vitamin (MULTIVITAMIN WITH MINERALS) TABS tablet Take 1 tablet by mouth every evening.     pantoprazole  (PROTONIX ) 40 MG tablet Take 1 tablet (40 mg total) by mouth daily. 90 tablet 3   potassium chloride  SA (KLOR-CON  M) 20 MEQ tablet Take 1 tablet (20 mEq total) by mouth daily. 30 tablet 3   rosuvastatin  (CRESTOR ) 5 MG tablet 1 tablet Orally MWF for 90 days     sodium chloride  (OCEAN) 0.65 % SOLN nasal spray Place 1 spray into both nostrils as needed for congestion. 22.5 mL 0   tamsulosin  (FLOMAX ) 0.4 MG CAPS capsule Take 0.4 mg by mouth at bedtime.      Vericiguat  (VERQUVO ) 5 MG TABS TAKE 1 TABLET BY MOUTH EVERY DAY (Patient not taking: Reported on 01/30/2024) 90 tablet 3   No facility-administered medications prior to visit.    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  Cardiac MRI 2021 with EF 39% and moderate RV dysfunction and non-compaction   Dyspnea    with some activity   GERD (gastroesophageal reflux disease)    occasional   Glaucoma    Hyperlipidemia    statin intolerant at doses > Crestor  5mg  3 times weekly   OSA on CPAP    PAF (paroxysmal atrial fibrillation) (HCC)    CHADS2VASC score 2 on apixaban       Objective:     BP (!) 78/52   Pulse 87   Ht 5' 11 (1.803 m)   Wt 283 lb 3.2 oz  (128.5 kg)   SpO2 93% Comment: ra  BMI 39.50 kg/m   SpO2: 93 % (ra) somber amb mod obese (by BMI) wm nad   HEENT : Oropharynx  min erythema/ no excess pnd or cobblestoning      Nasal turbinates mild non-speciff edema    NECK :  without  apparent JVD/ palpable Nodes/TM    LUNGS: no acc muscle use,  Nl contour chest which is clear to A and P bilaterally without cough on insp or exp maneuvers   CV:  RRR  no s3 or murmur or increase in P2, and 2+ pitting knees down bilaterally despite elastic hose   ABD:  obese soft and nontender   MS:  Gait nl   ext warm without deformities Or obvious joint restrictions  calf tenderness, cyanosis or clubbing    SKIN: warm and dry without lesions    NEURO:  alert, approp, nl sensorium with  no motor or cerebellar deficits apparent.   CXR PA and Lateral:   01/30/2024 :    I personally reviewed images and agree with radiology impression as follows:    Min kyphosis/ no acute findings       Assessment   Assessment & Plan Upper airway cough syndrome Never smoker/ onset around 2015 in setting of chronic sinus dz dating back to around 2010 Allergy screen 01/30/2024 >  Eos 0. /  IgE  pending  - cyclical cough protocol 01/30/2024 using max gerd rx/ hs 1st gen H1 blockers per guidelines  / delsym plus tessalon  and pred x 6d taper   Very strongly favor here a dx of Upper airway cough syndrome (previously labeled PNDS),  is so named because it's frequently impossible to sort out how much is  CR/sinusitis with freq throat clearing (which can be related to primary GERD)   vs  causing  secondary ( extra esophageal)  GERD from wide swings in gastric pressure that occur with throat clearing, often  promoting self use of mint and menthol  lozenges that reduce the lower esophageal sphincter tone and exacerbate the problem further in a cyclical fashion.   These are the same pts (now being labeled as having irritable larynx syndrome by some cough centers) who not  infrequently have a history of having failed to tolerate ace inhibitors,  dry powder inhalers or biphosphonates or report having atypical/extraesophageal reflux symptoms(globus from LPR)  that don't respond to standard doses of PPI  and are easily confused as having aecopd or asthma flares by even experienced allergists/ pulmonologists (myself included).     Of the three most common causes of  Sub-acute / recurrent or chronic cough, only one (GERD)  can actually contribute to/ trigger  the other two (asthma and post nasal drip syndrome)  and perpetuate the cylce of cough.  While not intuitively obvious, many patients with chronic low grade reflux do not cough until there is a primary insult that disturbs the protective epithelial barrier and exposes sensitive nerve endings.   This is typically viral but can due to PNDS and  either may apply here.   >>>  The point is that once this occurs, it is difficult to eliminate the cycle  using anything but a maximally effective acid suppression regimen at least in the short run, accompanied by an appropriate diet to address non acid GERD and control / eliminate the cough itself for at least 3 days with delsym/ tessalon  >>> also added 6 day taper off  Prednisone  starting at 40 mg per day in case of component of Th-2 driven upper or lower airways inflammation (if cough responds short term only to relapse before return while will on full rx for uacs (as above), then that would point to allergic rhinitis/ asthma or eos bronchitis as alternative dx)    Discussed in detail all  the  indications, usual  risks and alternatives  relative to the benefits with patient who agrees to proceed with Rx as outlined.      see avs for instructions unique to this ov      DOE (dyspnea on exertion) Dyspnea onset 2015 -PFT 06/18/20  2022 nl x for erv 59% at wt 296  - ECHO   11/23/23  mod reduced LV fnc with ef 35-40%  with LA severe / RA mod dilated - 01/30/2024   Walked on RA  x   450  lap(s) =  approx 450  ft  @ mod  pace, stopped due to end of study  with lowest 02 sats 8*% and sob and leg pain toward end of  last lap - 01/30/2024 DOE labs pending   - cxr clear and he's on DOAC so most likely desats are from obesity related dependent atx and mild chronic chf though can't rule out occult ILD  - will ask him to start monitoring 02 sats at peak ex and regroup in 4 weeks.  Each maintenance medication was reviewed in detail including emphasizing most importantly the difference between maintenance and prns and under what circumstances the prns are to be triggered using an action plan format where appropriate.  Total time for H and P, chart review, counseling,  directly observing portions of ambulatory 02 saturation study/ and generating customized AVS unique to this office visit / same day charting = 60 min  new pt  eval for multiple  refractory respiratory  symptoms of uncertain etiology                  AVS  Patient Instructions  Take delsym two tsp every 12 hours and supplement if needed with tessalon  200  mg up to 1 every 4 hours to suppress the urge to cough. Swallowing water  and/or using ice chips/non mint and menthol  containing candies (such as lifesavers or sugarless jolly ranchers) are also effective.  You should rest your voice and avoid activities that you know make you cough.  Once you have eliminated the cough for 3 straight days try reducing tessalon  200 first,  then the delsym as tolerated.     Pantoprazole  (protonix ) 40 mg   Take  30-60 min before first meal of the day and Pepcid  (famotidine )  20 mg after supper until return to office - this is the best way to tell whether stomach acid is contributing to your problem.    Prednisone  10 mg take  4 each am x 2 days,   2 each am x 2 days,  1 each am x 2 days and stop   GERD (REFLUX)  is an extremely common cause of respiratory symptoms just like yours , many times with no obvious heartburn at all.    It can  be treated with medication, but also with lifestyle changes including elevation of the head of your bed (ideally with 6 -8inch blocks under the headboard of your bed),  Smoking cessation, avoidance of late meals, excessive alcohol, and avoid fatty foods, chocolate, peppermint, colas, red wine, and acidic juices such as orange juice.  NO MINT OR MENTHOL  PRODUCTS SO NO COUGH DROPS  USE SUGARLESS CANDY INSTEAD (Jolley ranchers or Stover's or Life Savers) or even ice chips will also do - the key is to swallow to prevent all throat clearing. NO OIL BASED VITAMINS - use powdered substitutes.  Avoid fish oil when coughing.    For drainage / throat tickle  try take CHLORPHENIRAMINE  4 mg(over the counter)    may cause drowsiness so start with just a dose or two an hour before bedtime and see how you tolerate it before trying in daytime.   Please remember to go to the lab department   for your tests - we will call you with the results when they are available.      Please remember to go to the  x-ray department  @  [facility] for your tests - we will call you with the results when they are available      Please schedule a follow up office visit in 4 weeks, sooner if needed          Ozell America, MD 01/30/2024

## 2024-01-29 ENCOUNTER — Ambulatory Visit: Attending: Cardiology

## 2024-01-29 DIAGNOSIS — I5022 Chronic systolic (congestive) heart failure: Secondary | ICD-10-CM

## 2024-01-29 DIAGNOSIS — Z950 Presence of cardiac pacemaker: Secondary | ICD-10-CM | POA: Diagnosis not present

## 2024-01-30 ENCOUNTER — Encounter: Payer: Self-pay | Admitting: Internal Medicine

## 2024-01-30 ENCOUNTER — Ambulatory Visit: Admitting: Internal Medicine

## 2024-01-30 ENCOUNTER — Ambulatory Visit (HOSPITAL_COMMUNITY)
Admission: RE | Admit: 2024-01-30 | Discharge: 2024-01-30 | Disposition: A | Discharge status: Home / Self Care | Source: Ambulatory Visit | Attending: Internal Medicine | Admitting: Internal Medicine

## 2024-01-30 VITALS — BP 78/52 | HR 87 | Ht 71.0 in | Wt 283.2 lb

## 2024-01-30 DIAGNOSIS — R0609 Other forms of dyspnea: Secondary | ICD-10-CM | POA: Diagnosis not present

## 2024-01-30 DIAGNOSIS — R058 Other specified cough: Secondary | ICD-10-CM

## 2024-01-30 DIAGNOSIS — Z950 Presence of cardiac pacemaker: Secondary | ICD-10-CM | POA: Diagnosis not present

## 2024-01-30 DIAGNOSIS — R0602 Shortness of breath: Secondary | ICD-10-CM | POA: Diagnosis not present

## 2024-01-30 MED ORDER — PREDNISONE 10 MG PO TABS: ORAL_TABLET | ORAL | 0 refills | Status: DC

## 2024-01-30 MED ORDER — BENZONATATE 200 MG PO CAPS: 200.0000 mg | ORAL_CAPSULE | Freq: Three times a day (TID) | ORAL | 1 refills | Status: DC | PRN

## 2024-01-30 MED ORDER — FAMOTIDINE 20 MG PO TABS: ORAL_TABLET | ORAL | 11 refills | Status: DC

## 2024-01-30 NOTE — Assessment & Plan Note (Addendum)
 Never smoker/ onset around 2015 in setting of chronic sinus dz dating back to around 2010 Allergy screen 01/30/2024 >  Eos 0. /  IgE  pending  - cyclical cough protocol 01/30/2024 using max gerd rx/ hs 1st gen H1 blockers per guidelines  / delsym plus tessalon  and pred x 6d taper   Very strongly favor here a dx of Upper airway cough syndrome (previously labeled PNDS),  is so named because it's frequently impossible to sort out how much is  CR/sinusitis with freq throat clearing (which can be related to primary GERD)   vs  causing  secondary ( extra esophageal)  GERD from wide swings in gastric pressure that occur with throat clearing, often  promoting self use of mint and menthol  lozenges that reduce the lower esophageal sphincter tone and exacerbate the problem further in a cyclical fashion.   These are the same pts (now being labeled as having irritable larynx syndrome by some cough centers) who not infrequently have a history of having failed to tolerate ace inhibitors,  dry powder inhalers or biphosphonates or report having atypical/extraesophageal reflux symptoms(globus from LPR)  that don't respond to standard doses of PPI  and are easily confused as having aecopd or asthma flares by even experienced allergists/ pulmonologists (myself included).     Of the three most common causes of  Sub-acute / recurrent or chronic cough, only one (GERD)  can actually contribute to/ trigger  the other two (asthma and post nasal drip syndrome)  and perpetuate the cylce of cough.  While not intuitively obvious, many patients with chronic low grade reflux do not cough until there is a primary insult that disturbs the protective epithelial barrier and exposes sensitive nerve endings.   This is typically viral but can due to PNDS and  either may apply here.   >>>  The point is that once this occurs, it is difficult to eliminate the cycle  using anything but a maximally effective acid suppression regimen at least in  the short run, accompanied by an appropriate diet to address non acid GERD and control / eliminate the cough itself for at least 3 days with delsym/ tessalon  >>> also added 6 day taper off  Prednisone  starting at 40 mg per day in case of component of Th-2 driven upper or lower airways inflammation (if cough responds short term only to relapse before return while will on full rx for uacs (as above), then that would point to allergic rhinitis/ asthma or eos bronchitis as alternative dx)    Discussed in detail all the  indications, usual  risks and alternatives  relative to the benefits with patient who agrees to proceed with Rx as outlined.      see avs for instructions unique to this ov

## 2024-01-30 NOTE — Patient Instructions (Addendum)
 Take delsym two tsp every 12 hours and supplement if needed with tessalon  200  mg up to 1 every 4 hours to suppress the urge to cough. Swallowing water  and/or using ice chips/non mint and menthol  containing candies (such as lifesavers or sugarless jolly ranchers) are also effective.  You should rest your voice and avoid activities that you know make you cough.  Once you have eliminated the cough for 3 straight days try reducing tessalon  200 first,  then the delsym as tolerated.     Pantoprazole  (protonix ) 40 mg   Take  30-60 min before first meal of the day and Pepcid  (famotidine )  20 mg after supper until return to office - this is the best way to tell whether stomach acid is contributing to your problem.    Prednisone  10 mg take  4 each am x 2 days,   2 each am x 2 days,  1 each am x 2 days and stop   GERD (REFLUX)  is an extremely common cause of respiratory symptoms just like yours , many times with no obvious heartburn at all.    It can be treated with medication, but also with lifestyle changes including elevation of the head of your bed (ideally with 6 -8inch blocks under the headboard of your bed),  Smoking cessation, avoidance of late meals, excessive alcohol, and avoid fatty foods, chocolate, peppermint, colas, red wine, and acidic juices such as orange juice.  NO MINT OR MENTHOL  PRODUCTS SO NO COUGH DROPS  USE SUGARLESS CANDY INSTEAD (Jolley ranchers or Stover's or Life Savers) or even ice chips will also do - the key is to swallow to prevent all throat clearing. NO OIL BASED VITAMINS - use powdered substitutes.  Avoid fish oil when coughing.    For drainage / throat tickle try take CHLORPHENIRAMINE  4 mg(over the counter)    may cause drowsiness so start with just a dose or two an hour before bedtime and see how you tolerate it before trying in daytime.   Please remember to go to the lab department   for your tests - we will call you with the results when they are available.       Please remember to go to the  x-ray department  @  [facility] for your tests - we will call you with the results when they are available      Please schedule a follow up office visit in 4 weeks, sooner if needed

## 2024-01-30 NOTE — Assessment & Plan Note (Addendum)
 Dyspnea onset 2015 -PFT 06/18/20  2022 nl x for erv 59% at wt 296  - ECHO   11/23/23  mod reduced LV fnc with ef 35-40%  with LA severe / RA mod dilated - 01/30/2024   Walked on RA  x  450  lap(s) =  approx 450  ft  @ mod  pace, stopped due to end of study  with lowest 02 sats 8*% and sob and leg pain toward end of  last lap - 01/30/2024 DOE labs pending   - cxr clear and he's on DOAC so most likely desats are from obesity related dependent atx and mild chronic chf though can't rule out occult ILD  - will ask him to start monitoring 02 sats at peak ex and regroup in 4 weeks.  Each maintenance medication was reviewed in detail including emphasizing most importantly the difference between maintenance and prns and under what circumstances the prns are to be triggered using an action plan format where appropriate.  Total time for H and P, chart review, counseling,  directly observing portions of ambulatory 02 saturation study/ and generating customized AVS unique to this office visit / same day charting = 60 min  new pt  eval for multiple  refractory respiratory  symptoms of uncertain etiology

## 2024-01-31 ENCOUNTER — Ambulatory Visit (HOSPITAL_COMMUNITY)
Admission: RE | Admit: 2024-01-31 | Discharge: 2024-01-31 | Disposition: A | Discharge status: Home / Self Care | Source: Ambulatory Visit | Attending: Internal Medicine | Admitting: Internal Medicine

## 2024-01-31 ENCOUNTER — Ambulatory Visit (HOSPITAL_COMMUNITY): Payer: Self-pay | Admitting: Internal Medicine

## 2024-01-31 ENCOUNTER — Encounter (HOSPITAL_COMMUNITY): Payer: Self-pay

## 2024-01-31 VITALS — BP 94/68 | HR 70 | Ht 71.0 in | Wt 283.4 lb

## 2024-01-31 DIAGNOSIS — I2510 Atherosclerotic heart disease of native coronary artery without angina pectoris: Secondary | ICD-10-CM | POA: Diagnosis not present

## 2024-01-31 DIAGNOSIS — I951 Orthostatic hypotension: Secondary | ICD-10-CM | POA: Diagnosis not present

## 2024-01-31 DIAGNOSIS — I5022 Chronic systolic (congestive) heart failure: Secondary | ICD-10-CM | POA: Diagnosis not present

## 2024-01-31 DIAGNOSIS — G4733 Obstructive sleep apnea (adult) (pediatric): Secondary | ICD-10-CM | POA: Diagnosis not present

## 2024-01-31 DIAGNOSIS — I429 Cardiomyopathy, unspecified: Secondary | ICD-10-CM | POA: Diagnosis not present

## 2024-01-31 DIAGNOSIS — Z7984 Long term (current) use of oral hypoglycemic drugs: Secondary | ICD-10-CM | POA: Insufficient documentation

## 2024-01-31 DIAGNOSIS — I4821 Permanent atrial fibrillation: Secondary | ICD-10-CM | POA: Diagnosis not present

## 2024-01-31 DIAGNOSIS — Z7901 Long term (current) use of anticoagulants: Secondary | ICD-10-CM | POA: Insufficient documentation

## 2024-01-31 DIAGNOSIS — I517 Cardiomegaly: Secondary | ICD-10-CM | POA: Diagnosis not present

## 2024-01-31 DIAGNOSIS — R5383 Other fatigue: Secondary | ICD-10-CM | POA: Diagnosis not present

## 2024-01-31 DIAGNOSIS — Z79899 Other long term (current) drug therapy: Secondary | ICD-10-CM | POA: Insufficient documentation

## 2024-01-31 LAB — BASIC METABOLIC PANEL WITH GFR
Anion gap: 10 (ref 5–15)
BUN: 14 mg/dL (ref 8–23)
CO2: 23 mmol/L (ref 22–32)
Calcium: 8.8 mg/dL — ABNORMAL LOW (ref 8.9–10.3)
Chloride: 105 mmol/L (ref 98–111)
Creatinine, Ser: 1.02 mg/dL (ref 0.61–1.24)
GFR, Estimated: >60 mL/min (ref >60)
Glucose, Bld: 106 mg/dL — ABNORMAL HIGH (ref 70–99)
Potassium: 4.2 mmol/L (ref 3.5–5.1)
Sodium: 138 mmol/L (ref 135–145)

## 2024-01-31 LAB — IRON AND TIBC
Iron: 102 ug/dL (ref 45–182)
Saturation Ratios: 39 % (ref 17.9–39.5)
TIBC: 260 ug/dL (ref 250–450)
UIBC: 158 ug/dL

## 2024-01-31 LAB — CBC
HCT: 42.7 % (ref 39.0–52.0)
Hemoglobin: 14.4 g/dL (ref 13.0–17.0)
MCH: 31.5 pg (ref 26.0–34.0)
MCHC: 33.7 g/dL (ref 30.0–36.0)
MCV: 93.4 fL (ref 80.0–100.0)
Platelets: 221 K/uL (ref 150–400)
RBC: 4.57 MIL/uL (ref 4.22–5.81)
RDW: 13.4 % (ref 11.5–15.5)
WBC: 5.9 K/uL (ref 4.0–10.5)
nRBC: 0 % (ref 0.0–0.2)

## 2024-01-31 LAB — BRAIN NATRIURETIC PEPTIDE: B Natriuretic Peptide: 93.1 pg/mL (ref 0.0–100.0)

## 2024-01-31 LAB — FERRITIN: Ferritin: 309 ng/mL (ref 24–336)

## 2024-01-31 MED ORDER — FUROSEMIDE 20 MG PO TABS: 20.0000 mg | ORAL_TABLET | ORAL | Status: AC

## 2024-01-31 NOTE — Patient Instructions (Signed)
 Medication Changes:  STOP LOSARTAN    TAKE LASIX  (FUROSEMIDE ) 20MG  ONCE DAILY, MAY TAKE AN EXTRA TABLET ONCE DAILY IF NEEDED FOR SWELLING OR WEIGHT GAIN OF 3 POUNDS OVERNIGHT OR 5 POUNDS IN 1 WEEK   Lab Work:  Labs done today, your results will be available in MyChart, we will contact you for abnormal readings.  Follow-Up in: AS SCHEDULED WITH DR. ROLAN IN OCTOBER   At the [facility], you and your health needs are our priority. We have a designated team specialized in the treatment of Heart Failure. This Care Team includes your primary Heart Failure Specialized Cardiologist (physician), Advanced Practice Providers (APPs- Physician Assistants and Nurse Practitioners), and Pharmacist who all work together to provide you with the care you need, when you need it.   You may see any of the following providers on your designated Care Team at your next follow up:  Dr. Toribio Fuel Dr. Ezra ROLAN Dr. Ria Commander Dr. Odis Brownie Greig Mosses, NP Caffie Shed, [place] [facility] [place], [place] Beckey Coe, NP [place] Lee, NP Tinnie Redman, PharmD   Please be sure to bring in all your medications bottles to every appointment.   Need to Contact Us :  If you have any questions or concerns before your next appointment please send us  a message through [place] or call our office at [phone].    TO LEAVE A MESSAGE FOR THE NURSE SELECT OPTION 2, PLEASE LEAVE A MESSAGE INCLUDING: YOUR NAME DATE OF BIRTH CALL BACK NUMBER REASON FOR CALL**this is important as we prioritize the call backs  YOU WILL RECEIVE A CALL BACK THE SAME DAY AS LONG AS YOU CALL BEFORE 4:00 PM

## 2024-01-31 NOTE — Progress Notes (Signed)
 [facility] Progress Note    PCP: Okey Carlin Redbird, MD Cardiology: Dr. Shlomo HF Cardiology: Dr. Rolan  78 y.o. with history of chronic systolic CHF and paroxysmal atrial fibrillation was referred by Dr. Shlomo for evaluation of CHF.  Patient has been short of breath for years, worse over the last 6 months.  He has had an extensive workup.  Echo in 3/21 showed EF 45-50% with mildly decreased RV function.  Cardiolite  in 4/21 showed fixed apical defect.  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  PYP scan in 8/21 was not suggestive of TTR cardiac amyloidosis.   He had been in persistent atrial fibrillation for about 2 years.  He saw Dr. Cindie and was started on amiodarone .  DCCV was done in 10/21 back to NSR.  He was found to have M-spike on SPEP.  He saw Dr. Timmy who thought this was MGUS.   LHC/RHC in 12/21 showed occluded small OM2 and 80-90% stenosis mid PLOM (small caliber), no interventional target; normal filling pressures and preserved cardiac output.   He was unable to tolerate Jardiance  due to extreme dizziness. He is off both Coreg  and Toprol  XL due to lightheadedness/hypotension.   He developed recurrent atrial fibrillation and failed DCCV in 4/22.  He has been in persistent atrial fibrillation, Dr Cindie stopped his amiodarone  due to futility.  In 7/22, he had AV nodal ablation with St Jude CRT-P device placement.   Echo in 5/22 showed EF 35-40%, mild LV enlargement, normal RV.  Echo in 5/23 showed EF 35-40%, diffuse hypokinesis. RV mildly reduced.   He has had issues recently tolerating GDMT due to low BP.   Echo (6/24) showed EF 50-55%, normal RV (poor images, may not be accurate EF).  Echo 6/25 EF 35-40% with diffuse hypokinesis, mild RV dilation/mild RV dysfunction, mild MR, severe biatrial enlargement.   Today he returns for AHF sick visit with ongoing  dizziness, low BP and fatigue. Denies palpitations, CP,  or PND/Orthopnea. Has edema in BLE at baseline, has not noticed increased swelling recently. He experiences dizziness with activity and when he gets up first in the morning.  SOB with activity. Appetite ok, tried to avoid salt but hard to control when he eats out. Eats out 2-3 x a week. Drinks around 68 oz. Drinks a beer very seldomly.  No fever or chills. Weight at home 276-278 pounds. Taking all medications. Denies ETOH or drug use.   St Jude device interrogation: 99% BiV paced, thoracic impedance stable and corvue stable. (Personally reviewed)    ECG (personally reviewed from 7/25): AF with BiV pacing.   PMH: 1. CVA: Occurred in 3/21, he was not on apixaban  at the time.  2. GERD 3. OSA: Uses CPAP.  4. Hyperlipidemia 5. Atrial fibrillation: Permanent - DCCV to NSR in 10/21.  - Failed DCCV in 4/22, stopped amiodarone .  - AV nodal ablation with St Jude CRT-P in 7/22.  6. MGUS 7. Chronic systolic CHF: Echo (3/21) with EF 45-50%, mild LVH, mildly decreased RV systolic function. Primarily nonischemic cardiomyopathy.  - Cardiolite  (4/21): Fixed apical defect.  - Cardiac MRI (9/21): Mild LV dilation with mild LV hypertrophy, EF 39%, prominent trabeculations concerning for noncompaction. Mild RV dilation with EF 28%, severe LAE.  No delayed enhancement.  - PYP scan (8/21): grade 1, H/CL 1-1.5.  Unlikely to have cardiac amyloidosis.  - LHC/RHC (12/21): small OM2  totally occluded, 80-90% mid small PLOM (no interventional target); mean RA 5, PA 25/9, mean PCWP 5, CI 2.77 Fick/3.29 Thermo.  - St Jude CRT-P  - Echo (5/23): EF 35-40%, diffuse hypokinesis, mildly decreased RV systolic function, IVC normal.  - Echo (6/24): EF 50-55%, normal RV (poor images, ?accuracy). - Echo (6/25): EF 35-40% with diffuse hypokinesis, mild RV dilation/mild RV dysfunction, mild MR, severe biatrial enlargement.  8. Orthostatic hypotension.  9. CAD: Coronary  angiography 12/21 with small OM2 totally occluded, 80-90% mid small PLOM (no interventional target) 10. Carotid ultrasound (8/22): mild BICA stenosis.  11. COVID-19 9/22 12. PVCs  Social History   Socioeconomic History   Marital status: Married    Spouse name: Not on file   Number of children: Not on file   Years of education: Not on file   Highest education level: Not on file  Occupational History   Not on file  Tobacco Use   Smoking status: Never   Smokeless tobacco: Never  Vaping Use   Vaping status: Never Used  Substance and Sexual Activity   Alcohol use: Yes    Alcohol/week: 1.0 standard drink of alcohol    Types: 1 Cans of beer per week    Comment: Rare   Drug use: No   Sexual activity: Not Currently  Other Topics Concern   Not on file  Social History Narrative   Not on file   Social Drivers of Health   Financial Resource Strain: Not on file  Food Insecurity: Not on file  Transportation Needs: Not on file  Physical Activity: Not on file  Stress: Not on file  Social Connections: Not on file  Intimate Partner Violence: Not on file   Family History  Problem Relation Age of Onset   Lung cancer Mother        smoker   ROS: All systems reviewed and negative except as per HPI.   Current Outpatient Medications  Medication Sig Dispense Refill   acetaminophen  (TYLENOL ) 500 MG tablet 1,000 mg. Patient takes 2 tablets by mouth in the morning and night     apixaban  (ELIQUIS ) 5 MG TABS tablet Take 1 tablet (5 mg total) by mouth 2 (two) times daily. 60 tablet 0   benzonatate  (TESSALON ) 200 MG capsule Take 1 capsule (200 mg total) by mouth 3 (three) times daily as needed for cough. 30 capsule 1   cetirizine (ZYRTEC) 10 MG tablet Take 10 mg by mouth at bedtime.     Cholecalciferol (VITAMIN D3) 50 MCG (2000 UT) TABS Take 2,000 Units by mouth daily.     empagliflozin  (JARDIANCE ) 10 MG TABS tablet Take 1 tablet (10 mg total) by mouth daily before breakfast. 90 tablet 1    eplerenone  (INSPRA ) 50 MG tablet Take 0.5 tablets (25 mg total) by mouth at bedtime.     Evolocumab  (REPATHA  SURECLICK) 140 MG/ML SOAJ INJECT 140MG  INTO THE SKIN EVERY 2 WEEKS 6 mL 3   ezetimibe  (ZETIA ) 10 MG tablet TAKE 1 TABLET BY MOUTH EVERY DAY 90 tablet 2   famotidine  (PEPCID ) 20 MG tablet One after supper 30 tablet 11   ferrous sulfate  325 (65 FE) MG tablet Take 325 mg by mouth in the morning.     fluticasone  (FLONASE ) 50 MCG/ACT nasal spray Place 2 sprays into both nostrils 2 (two) times daily.      furosemide  (LASIX ) 40 MG tablet Take 1 tablet (20 mg total) every Mon, Wed and Friday.   Take 2 tablets (40 mg total)  every Earlean Everts, Sat and Sun. 132 tablet 2   Glucosamine Sulfate 750 MG TABS Take 750 mg by mouth in the morning and at bedtime.     ipratropium (ATROVENT) 0.06 % nasal spray SMARTSIG:2 Spray(s) Both Nares Twice Daily PRN     losartan  (COZAAR ) 25 MG tablet Take 0.5 tablets (12.5 mg total) by mouth at bedtime. 45 tablet 3   Melatonin 10 MG TABS Take 10 mg by mouth at bedtime.      Multiple Vitamin (MULTIVITAMIN WITH MINERALS) TABS tablet Take 1 tablet by mouth every evening.     pantoprazole  (PROTONIX ) 40 MG tablet Take 1 tablet (40 mg total) by mouth daily. 90 tablet 3   potassium chloride  SA (KLOR-CON  M) 20 MEQ tablet Take 1 tablet (20 mEq total) by mouth daily. 30 tablet 3   predniSONE  (DELTASONE ) 10 MG tablet Take  4 each am x 2 days,   2 each am x 2 days,  1 each am x 2 days and stop 14 tablet 0   rosuvastatin  (CRESTOR ) 5 MG tablet 1 tablet Orally MWF for 90 days     sodium chloride  (OCEAN) 0.65 % SOLN nasal spray Place 1 spray into both nostrils as needed for congestion. 22.5 mL 0   tamsulosin  (FLOMAX ) 0.4 MG CAPS capsule Take 0.4 mg by mouth at bedtime.      Vericiguat  (VERQUVO ) 5 MG TABS TAKE 1 TABLET BY MOUTH EVERY DAY (Patient not taking: Reported on 01/31/2024) 90 tablet 3   No current facility-administered medications for this encounter.   BP 94/68   Pulse 70    Ht 5' 11 (1.803 m)   Wt 128.5 kg (283 lb 6.4 oz)   SpO2 95%   BMI 39.53 kg/m   Wt Readings from Last 3 Encounters:  01/31/24 128.5 kg (283 lb 6.4 oz)  01/30/24 128.5 kg (283 lb 3.2 oz)  12/13/23 134.7 kg (297 lb)   PHYSICAL EXAM: General:  elderly appearing.  No respiratory difficulty. Walked into clinic Neck: JVD flat.  Cor: Regular rate & irregular rhythm. No murmurs. Lungs: clear Extremities: non-pitting BLE edema, wearing compression socks Neuro: alert & oriented x 3. Affect pleasant.   Assessment/Plan: 1. Chronic systolic CHF: Cardiac MRI in 9/21 with EF 39% and evidence for noncompaction cardiomyopathy, no delayed enhancement, significant RV dysfunction with RV EF 22%.  PYP scan was not suggestive of TTR amyloidosis and no symptoms or peripheral neuropathy.  12/21 LHC showed CAD but does not explain cardiomyopathy, no interventional target.  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  Echo 10/2021 showed  EF 35-40%, diffuse hypokinesis, mildly decreased RV systolic function, IVC normal. He has a history of orthostatic hypotension which has limited medication titration but this is doing better since AV nodal ablation with St Jude CRT-P.  Echo (6/24) showed EF 50-55%, normal RV. However, this was a difficult study, and echo 6/25 showed EF 35-40% with diffuse HK, mild RV dilation/mild RV dysfunction, more in line with prior echoes.   - NYHA class II-IIIa symptoms.  - Appears euvolemic on exam and stable on CorVue.  - Decrease lasix  to 20 mg daily + PRN for weight gain (>3 lbs overnight and >5lbs in 1 week). He keep track of his weight daily.  - Continue Jardiance  10 mg daily. No GU symptoms. - Continue eplerenone  25 mg qhs.   - Stop Losartan , did not tolerate despite switch to QHS - Unable to tolerate Coreg  or Toprol  XL due to lightheadedness/low BP.  -  Not candidate for cardiac contractility modulation due to CRT.  - Now off Verquvo   - Continue compression  hose. - BMET/BNP today.   2. Atrial fibrillation: Permanent.  He has severe LAE. Not good candidate for atrial fibrillation ablation per EP.  He failed DCCV on amiodarone  in 4/22, amiodarone  stopped.  AV nodal ablation with St Jude CRT-P in 7/22. - Continue apixaban  5 mg bid. No bleeding issues. CBC today  3. OSA: Continue CPAP.   4. Orthostatic hypotension: Limits medications.   - Continue compression stockings.  - Encouraged slow positional changes. - Stopping losartan  as above.    5. CAD: Moderate disease on cath in 12/21, no interventional target.  No chest pain.  Extent of disease does not explain cardiomyopathy.   - Continue Crestor  5 mg qod and Repatha .  - He is on apixaban  with stable CAD, so no ASA.   6. Fatigue - Check anemia panel  Follow up as scheduled with Dr. Rolan in October.   Beckey LITTIE Coe, AGACNP-BC  01/31/2024

## 2024-02-01 NOTE — Progress Notes (Addendum)
 EPIC Encounter for ICM Monitoring  Patient Name: Joshua Moran. is a 78 y.o. male Date: 02/01/2024 Primary Care Physican: Okey Carlin Redbird, MD Primary Cardiologist: Rolan Electrophysiologist: Cindie BiV Pacing: >99% 10/19/2022 Weight: 279 lbs (276 lbs) 07/13/2023 Weight: 282 lbs     08/16/2023 Weight: 284 lbs   09/18/2023 Weight: 283 lbs   12/18/2023 Weight: 282.7 lbs  (was 295.2 lbs)    12/27/2023 Weight: 279.7 lbs    01/04/2024 Weight: 279 lbs                                     Spoke with patient and heart failure questions reviewed.  Transmission results reviewed.  Pt asymptomatic for fluid accumulation.   Lasix  was decreased at 8/27 HF Clinic OV due to dizziness and he thinks this will help.    Diet:  He eats at restaurants several times a week and aware the foods are higher in salt.     CorVue thoracic impedance suggesting normal fluid levels with the exception of possible fluid accumulation from 8/13-8/16.  Suggesting possible dryness from 8/17-8/23.   Prescribed:  Furosemide  20 mg take 1 tablet(s) (20 mg total) by mouth daily + may take an extra tablet daily if needed for swelling or weight gain of 3 pounds overnight or 5 pounds in a week. Potassium 20 mEq take 1 tablet (s) (20 mEq Total) by mouth daily    Labs: 01/31/2024 Creatinine 1.02, BUN 14, Potassium 4.2, Sodium 138, GFR >60 12/26/2023 Creatinine 1.05, BUN 16, Potassium 4.6, Sodium 139, GFR >60 12/13/2023 Creatinine 0.92, BUN 14, Potassium 4.1, Sodium 138, GFR >60, BNP 124.2 11/23/2023 Creatinine 1.13, BUN 15. Potassium 4.8, Sodium 142, GFR >60 11/13/2023 BNP 128.2 A complete set of results can be found in Results Review.   Recommendations:   No changes and encouraged to call if experiencing any fluid symptoms.     Follow-up plan: ICM clinic phone appointment on 03/04/2024.   91 day device clinic remote transmission 03/11/2024.     EP/Cardiology Office Visits:  03/11/2024 with Dr Rolan.  Recall 03/18/2024 with Dr  Cindie or Jodie Passey, PA.    Copy of ICM check sent to Dr. Cindie.  3 month ICM trend: 01/29/2024.    12-14 Month ICM trend:     [name], RN 02/01/2024 7:47 AM

## 2024-02-02 ENCOUNTER — Ambulatory Visit: Payer: Self-pay | Admitting: Internal Medicine

## 2024-02-02 LAB — CBC WITH DIFFERENTIAL/PLATELET
Basophils Absolute: 0 x10E3/uL (ref 0.0–0.2)
Basos: 1 %
EOS (ABSOLUTE): 0.3 x10E3/uL (ref 0.0–0.4)
Eos: 5 %
Hematocrit: 47.9 % (ref 37.5–51.0)
Hemoglobin: 15.4 g/dL (ref 13.0–17.7)
Immature Grans (Abs): 0 x10E3/uL (ref 0.0–0.1)
Immature Granulocytes: 0 %
Lymphocytes Absolute: 1.6 x10E3/uL (ref 0.7–3.1)
Lymphs: 26 %
MCH: 30.9 pg (ref 26.6–33.0)
MCHC: 32.2 g/dL (ref 31.5–35.7)
MCV: 96 fL (ref 79–97)
Monocytes Absolute: 0.6 x10E3/uL (ref 0.1–0.9)
Monocytes: 10 %
Neutrophils Absolute: 3.6 x10E3/uL (ref 1.4–7.0)
Neutrophils: 58 %
Platelets: 255 x10E3/uL (ref 150–450)
RBC: 4.98 x10E6/uL (ref 4.14–5.80)
RDW: 12 % (ref 11.6–15.4)
WBC: 6.2 x10E3/uL (ref 3.4–10.8)

## 2024-02-02 LAB — IGE: IgE (Immunoglobulin E), Serum: 14 [IU]/mL (ref 6–495)

## 2024-02-02 LAB — QUANTIFERON-TB GOLD PLUS
QuantiFERON Mitogen Value: >10 [IU]/mL
QuantiFERON Nil Value: 0.07 [IU]/mL
QuantiFERON TB1 Ag Value: 0.05 [IU]/mL
QuantiFERON TB2 Ag Value: 0.11 [IU]/mL
QuantiFERON-TB Gold Plus: NEGATIVE

## 2024-02-02 LAB — SEDIMENTATION RATE: Sed Rate: 4 mm/h (ref 0–30)

## 2024-02-02 NOTE — Progress Notes (Signed)
 Called am spoke with pt to relayed information. Confirmed understanding nfn.

## 2024-02-07 ENCOUNTER — Encounter: Payer: Self-pay | Admitting: Internal Medicine

## 2024-02-08 ENCOUNTER — Encounter: Payer: Self-pay | Admitting: Physician Assistant

## 2024-02-12 DIAGNOSIS — M47816 Spondylosis without myelopathy or radiculopathy, lumbar region: Secondary | ICD-10-CM | POA: Diagnosis not present

## 2024-02-12 NOTE — Progress Notes (Signed)
 Spoke with pt regarding his cxr - confirmed understanding. nfn

## 2024-02-19 DIAGNOSIS — M47816 Spondylosis without myelopathy or radiculopathy, lumbar region: Secondary | ICD-10-CM | POA: Diagnosis not present

## 2024-02-19 DIAGNOSIS — E7800 Pure hypercholesterolemia, unspecified: Secondary | ICD-10-CM | POA: Diagnosis not present

## 2024-02-19 DIAGNOSIS — D649 Anemia, unspecified: Secondary | ICD-10-CM | POA: Diagnosis not present

## 2024-02-19 DIAGNOSIS — Z79899 Other long term (current) drug therapy: Secondary | ICD-10-CM | POA: Diagnosis not present

## 2024-02-23 DIAGNOSIS — Z6841 Body Mass Index (BMI) 40.0 and over, adult: Secondary | ICD-10-CM | POA: Diagnosis not present

## 2024-02-23 DIAGNOSIS — K219 Gastro-esophageal reflux disease without esophagitis: Secondary | ICD-10-CM | POA: Diagnosis not present

## 2024-02-23 DIAGNOSIS — Z23 Encounter for immunization: Secondary | ICD-10-CM | POA: Diagnosis not present

## 2024-02-23 DIAGNOSIS — R42 Dizziness and giddiness: Secondary | ICD-10-CM | POA: Diagnosis not present

## 2024-02-23 DIAGNOSIS — Z0000 Encounter for general adult medical examination without abnormal findings: Secondary | ICD-10-CM | POA: Diagnosis not present

## 2024-02-23 DIAGNOSIS — D649 Anemia, unspecified: Secondary | ICD-10-CM | POA: Diagnosis not present

## 2024-02-26 DIAGNOSIS — M47816 Spondylosis without myelopathy or radiculopathy, lumbar region: Secondary | ICD-10-CM | POA: Diagnosis not present

## 2024-03-01 ENCOUNTER — Ambulatory Visit (INDEPENDENT_AMBULATORY_CARE_PROVIDER_SITE_OTHER): Admitting: Otolaryngology

## 2024-03-04 ENCOUNTER — Ambulatory Visit: Attending: Cardiology

## 2024-03-04 DIAGNOSIS — I5022 Chronic systolic (congestive) heart failure: Secondary | ICD-10-CM

## 2024-03-04 DIAGNOSIS — Z950 Presence of cardiac pacemaker: Secondary | ICD-10-CM | POA: Diagnosis not present

## 2024-03-04 DIAGNOSIS — M47816 Spondylosis without myelopathy or radiculopathy, lumbar region: Secondary | ICD-10-CM | POA: Diagnosis not present

## 2024-03-04 NOTE — Progress Notes (Unsigned)
 Joshua ONEIDA Lawernce Mickey., male    DOB: 1946/04/20    MRN: [id number]   Brief patient profile:  13  yowm never smoker  with onset around  2015 > eval by ENT= newman nasal polyps did not require surgeyr then [place] rec nasal sprays including flonase / atrovent some better pnds but referred to pulmonary clinic in [place]  01/30/2024 by Dale Gull  for flare of cough p covid 2021 then worse since Winter of 2025 > abx ? Which one seemed to help the most.  Seen last by PCCM 2019 with Echo c/w LVH/ diastolic dysfunction / pfts nl   PFT 06/18/20  2022 nl x for erv 59% at wt 296   ECHO   11/23/23  mod reduced LV fnc with ef 35-40%  with LA severe / RA mod dilated    History of Present Illness  01/30/2024  Pulmonary - Re-establish / 1st office eval/ Joshua Moran / [facility]  Chief Complaint  Patient presents with   Cough    PCP says pt has Mycobacterium avium complex and referred him here.  Chronic cough w white mucus  Dyspnea:  sedentary / no longer regular walking of any kind, prefers to let his wife do the shopping Cough: not present on awakening  though there is some nasal congestion and some sporadic during the day assoc with urge to clear to clear throat x 5-10  years >>> min  white mucus  Sleep: bed is flat/ 1 pillow immediately on lying down / mints seem to help transiently  SABA use: none  02: none  Rec    03/05/2024  f/u ov/Kyan Yurkovich re: ***   maint on ***  No chief complaint on file.   Dyspnea:  *** Cough: *** Sleeping: *** resp cc  SABA use: *** 02: ***  Lung cancer screening :  ***    No obvious day to day or daytime variability or assoc excess/ purulent sputum or mucus plugs or hemoptysis or cp or chest tightness, subjective wheeze or overt sinus or hb symptoms.    Also denies any obvious fluctuation of symptoms with weather or environmental changes or other aggravating or alleviating factors except as outlined above   No unusual exposure hx or h/o childhood pna/ asthma or knowledge  of premature birth.  Current Allergies, Complete Past Medical History, Past Surgical History, Family History, and Social History were reviewed in [employer] record.  ROS  The following are not active complaints unless bolded Hoarseness, sore throat, dysphagia, dental problems, itching, sneezing,  nasal congestion or discharge of excess mucus or purulent secretions, ear ache,   fever, chills, sweats, unintended wt loss or wt gain, classically pleuritic or exertional cp,  orthopnea pnd or arm/hand swelling  or leg swelling, presyncope, palpitations, abdominal pain, anorexia, nausea, vomiting, diarrhea  or change in bowel habits or change in bladder habits, change in stools or change in urine, dysuria, hematuria,  rash, arthralgias, visual complaints, headache, numbness, weakness or ataxia or problems with walking or coordination,  change in mood or  memory.        No outpatient medications have been marked as taking for the 03/05/24 encounter (Appointment) with Darlean Ozell NOVAK, MD.          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  Cardiac MRI 2021 with EF 39% and moderate RV dysfunction and non-compaction   Dyspnea    with some activity   GERD (gastroesophageal reflux disease)    occasional   Glaucoma    Hyperlipidemia    statin intolerant at doses > Crestor  5mg  3 times weekly   OSA on CPAP    PAF (paroxysmal atrial fibrillation) (HCC)    CHADS2VASC score 2 on apixaban       Objective:    Wt Readings from Last 3 Encounters:  01/31/24 283 lb 6.4 oz (128.5 kg)  01/30/24 283 lb 3.2 oz (128.5 kg)   12/13/23 297 lb (134.7 kg)      Vital signs reviewed  03/05/2024  - Note at rest 02 sats  ***% on ***   General appearance:    ***     2+ pitting knees down bilaterally despite elastic hose ***        Assessment

## 2024-03-05 ENCOUNTER — Encounter: Payer: Self-pay | Admitting: Internal Medicine

## 2024-03-05 ENCOUNTER — Ambulatory Visit (INDEPENDENT_AMBULATORY_CARE_PROVIDER_SITE_OTHER): Admitting: Internal Medicine

## 2024-03-05 VITALS — BP 114/66 | HR 70 | Ht 71.0 in | Wt 283.0 lb

## 2024-03-05 DIAGNOSIS — R058 Other specified cough: Secondary | ICD-10-CM

## 2024-03-05 DIAGNOSIS — R0609 Other forms of dyspnea: Secondary | ICD-10-CM

## 2024-03-05 MED ORDER — PANTOPRAZOLE SODIUM 40 MG PO TBEC: 40.0000 mg | DELAYED_RELEASE_TABLET | Freq: Every day | ORAL | 3 refills | Status: AC

## 2024-03-05 NOTE — Assessment & Plan Note (Addendum)
 Never smoker/ onset around 2015 in setting of chronic sinus dz dating back to around 2010 Allergy screen 01/30/2024 >  Eos 0. 3/  IgE  14 - cyclical cough protocol 01/30/2024 using max gerd rx/ hs 1st gen H1 blockers per guidelines  / delsym plus tessalon  and pred x 6d taper > did not get PPi or 1st gen H1 blockers per guidelines    The standardized cough guidelines published in Chest by Charlie Coder in 2006 are still the best available and consist of a multiple step process (up to 12!) , not a single office visit,  and are intended  to address this problem logically,  with an alogrithm dependent on response to empiric treatment at  each progressive step  to determine a specific diagnosis with  minimal addtional testing needed. Therefore if adherence is an issue or can't be accurately verified,  it's very unlikely the standard evaluation and treatment will be successful here.    Furthermore, response to therapy (other than acute cough suppression, which should only be used short term with avoidance of narcotic containing cough syrups if possible), can be a gradual process for which the patient is not likely to  perceive immediate benefit.  Unlike going to an eye doctor where the best perscription is almost always the first one and is immediately effective, this is almost never the case in the management of chronic cough syndromes. Therefore the patient needs to commit up front to consistently adhere to recommendations  for up to 6 weeks of therapy directed at the likely underlying problem(s) before the response can be reasonably evaluated.   Rec Rechallenge with max gerd rx/ 1st gen H1 blockers per guidelines  and f/u prn with all meds in hand using a trust but verify approach to confirm accurate Medication  Reconciliation The principal here is that until we are certain that the  patients are doing what we've asked, it makes no sense to ask them to do more.   Alternatively can do consult for Dr Brien at  [facility] voice center/ advised  Discussed in detail all the  indications, usual  risks and alternatives  relative to the benefits with patient who agrees to proceed with Rx as outlined.             Each maintenance medication was reviewed in detail including emphasizing most importantly the difference between maintenance and prns and under what circumstances the prns are to be triggered using an action plan format where appropriate.  Total time for H and P, chart review, counseling,  and generating customized AVS unique to this office visit / same day charting = 30 min

## 2024-03-05 NOTE — Patient Instructions (Addendum)
 Take delsym two tsp every 12 hours and supplement if needed with tessalon  200  mg up to 1 every 4 hours to suppress the urge to cough. Swallowing water  and/or using ice chips/non mint and menthol  containing candies (such as lifesavers or sugarless jolly ranchers) are also effective.  You should rest your voice and avoid activities that you know make you cough.  Once you have eliminated the cough for 3 straight days try reducing tessalon  200 first,  then the delsym as tolerated.     Pantoprazole  (protonix ) 40 mg   Take  30-60 min before first meal of the day and Pepcid  (famotidine )  20 mg after supper until return to office - this is the best way to tell whether stomach acid is contributing to your problem.     For drainage / throat tickle try take CHLORPHENIRAMINE  4 mg(over the counter)    may cause drowsiness so start with just a dose or two an hour before bedtime and see how you tolerate it before trying in daytime.   Return if needed with all active medications including over the counters or call me to let me refer you to DR GORMAN Silvan [facility] voice center.

## 2024-03-06 ENCOUNTER — Other Ambulatory Visit (HOSPITAL_COMMUNITY): Payer: Self-pay | Admitting: Cardiology

## 2024-03-06 MED ORDER — POTASSIUM CHLORIDE CRYS ER 20 MEQ PO TBCR: 20.0000 meq | EXTENDED_RELEASE_TABLET | Freq: Every day | ORAL | 3 refills | Status: DC

## 2024-03-06 NOTE — Progress Notes (Signed)
 EPIC Encounter for ICM Monitoring  Patient Name: Joshua Moran. is a 78 y.o. male Date: 03/06/2024 Primary Care Physican: Okey Carlin Redbird, MD Primary Cardiologist: Rolan Electrophysiologist: Cindie BiV Pacing: >99% 10/19/2022 Weight: 279 lbs (276 lbs) 07/13/2023 Weight: 282 lbs     08/16/2023 Weight: 284 lbs   09/18/2023 Weight: 283 lbs   12/18/2023 Weight: 282.7 lbs  (was 295.2 lbs)    12/27/2023 Weight: 279.7 lbs    01/04/2024 Weight: 279 lbs     03/06/2024 Weight: 276.8 lbs                                 Spoke with patient and heart failure questions reviewed.  Transmission results reviewed.  Pt asymptomatic for fluid accumulation.      Diet:  He eats at restaurants several times a week and aware the foods are higher in salt.     Since 01/29/2024 ICM Remote Transmission: CorVue thoracic impedance suggesting normal fluid levels with the exception of possible fluid accumulation from 02/02/2024-02/07/2024.     Prescribed:  Furosemide  20 mg take 1 tablet(s) (20 mg total) by mouth daily + may take an extra tablet daily if needed for swelling or weight gain of 3 pounds overnight or 5 pounds in a week. Potassium 20 mEq take 1 tablet (s) (20 mEq Total) by mouth daily    Labs: 01/31/2024 Creatinine 1.02, BUN 14, Potassium 4.2, Sodium 138, GFR >60 12/26/2023 Creatinine 1.05, BUN 16, Potassium 4.6, Sodium 139, GFR >60 12/13/2023 Creatinine 0.92, BUN 14, Potassium 4.1, Sodium 138, GFR >60, BNP 124.2 11/23/2023 Creatinine 1.13, BUN 15. Potassium 4.8, Sodium 142, GFR >60 11/13/2023 BNP 128.2 A complete set of results can be found in Results Review.   Recommendations:   No changes and encouraged to call if experiencing any fluid symptoms.     Follow-up plan: ICM clinic phone appointment on 04/08/2024.   91 day device clinic remote transmission 03/11/2024.     EP/Cardiology Office Visits:  03/11/2024 with Dr Rolan.  Provided EP scheduler number to make office appointment.  Recall 03/18/2024 with  Dr Cindie or Jodie Passey, PA.    Copy of ICM check sent to Dr. Cindie.    Remote monitoring is medically necessary for Heart Failure Management.    90 day Daily Thoracic Impedance ICM trend: 12/05/2023 through 03/04/2024.    12-14 Month Thoracic Impedance ICM trend:     [name], RN 03/06/2024 2:56 PM

## 2024-03-07 ENCOUNTER — Other Ambulatory Visit: Payer: Self-pay | Admitting: Internal Medicine

## 2024-03-07 DIAGNOSIS — R058 Other specified cough: Secondary | ICD-10-CM

## 2024-03-08 ENCOUNTER — Telehealth (HOSPITAL_COMMUNITY): Payer: Self-pay | Admitting: Cardiology

## 2024-03-08 NOTE — Telephone Encounter (Signed)
 Called to confirm/remind patient of their appointment at the [facility] on 03/08/24.   Appointment:   [x] Confirmed  [] Left mess   [] No answer/No voice mail  [] VM Full/unable to leave message  [] Phone not in service  Patient reminded to bring all medications and/or complete list.  Confirmed patient has transportation. Gave directions, instructed to utilize valet parking.

## 2024-03-11 ENCOUNTER — Ambulatory Visit: Payer: PPO

## 2024-03-11 ENCOUNTER — Ambulatory Visit (HOSPITAL_COMMUNITY)
Admission: RE | Admit: 2024-03-11 | Discharge: 2024-03-11 | Disposition: A | Discharge status: Home / Self Care | Source: Ambulatory Visit | Attending: Cardiology | Admitting: Cardiology

## 2024-03-11 ENCOUNTER — Ambulatory Visit (HOSPITAL_COMMUNITY): Payer: Self-pay | Admitting: Cardiology

## 2024-03-11 ENCOUNTER — Encounter (HOSPITAL_COMMUNITY): Payer: Self-pay | Admitting: Cardiology

## 2024-03-11 VITALS — BP 104/70 | HR 69 | Wt 286.4 lb

## 2024-03-11 DIAGNOSIS — I2510 Atherosclerotic heart disease of native coronary artery without angina pectoris: Secondary | ICD-10-CM | POA: Diagnosis not present

## 2024-03-11 DIAGNOSIS — Z950 Presence of cardiac pacemaker: Secondary | ICD-10-CM | POA: Insufficient documentation

## 2024-03-11 DIAGNOSIS — I5022 Chronic systolic (congestive) heart failure: Secondary | ICD-10-CM

## 2024-03-11 DIAGNOSIS — I480 Paroxysmal atrial fibrillation: Secondary | ICD-10-CM | POA: Insufficient documentation

## 2024-03-11 DIAGNOSIS — Z7901 Long term (current) use of anticoagulants: Secondary | ICD-10-CM | POA: Insufficient documentation

## 2024-03-11 DIAGNOSIS — I951 Orthostatic hypotension: Secondary | ICD-10-CM | POA: Insufficient documentation

## 2024-03-11 DIAGNOSIS — Z79899 Other long term (current) drug therapy: Secondary | ICD-10-CM | POA: Diagnosis not present

## 2024-03-11 DIAGNOSIS — G4733 Obstructive sleep apnea (adult) (pediatric): Secondary | ICD-10-CM | POA: Insufficient documentation

## 2024-03-11 DIAGNOSIS — I429 Cardiomyopathy, unspecified: Secondary | ICD-10-CM | POA: Insufficient documentation

## 2024-03-11 DIAGNOSIS — I4821 Permanent atrial fibrillation: Secondary | ICD-10-CM | POA: Insufficient documentation

## 2024-03-11 DIAGNOSIS — Z8616 Personal history of COVID-19: Secondary | ICD-10-CM | POA: Insufficient documentation

## 2024-03-11 DIAGNOSIS — M47816 Spondylosis without myelopathy or radiculopathy, lumbar region: Secondary | ICD-10-CM | POA: Diagnosis not present

## 2024-03-11 LAB — BASIC METABOLIC PANEL WITH GFR
Anion gap: 9 (ref 5–15)
BUN: 12 mg/dL (ref 8–23)
CO2: 23 mmol/L (ref 22–32)
Calcium: 8.3 mg/dL — ABNORMAL LOW (ref 8.9–10.3)
Chloride: 107 mmol/L (ref 98–111)
Creatinine, Ser: 1.09 mg/dL (ref 0.61–1.24)
GFR, Estimated: >60 mL/min (ref >60)
Glucose, Bld: 88 mg/dL (ref 70–99)
Potassium: 4.2 mmol/L (ref 3.5–5.1)
Sodium: 139 mmol/L (ref 135–145)

## 2024-03-11 LAB — BRAIN NATRIURETIC PEPTIDE: B Natriuretic Peptide: 88.1 pg/mL (ref 0.0–100.0)

## 2024-03-11 NOTE — Patient Instructions (Signed)
 There has been no changes to your medications.  Labs done today, your results will be available in MyChart, we will contact you for abnormal readings.  Your physician recommends that you schedule a follow-up appointment in: 3 months.  If you have any questions or concerns before your next appointment please send us  a message through [place] or call our office at [phone].    TO LEAVE A MESSAGE FOR THE NURSE SELECT OPTION 2, PLEASE LEAVE A MESSAGE INCLUDING: YOUR NAME DATE OF BIRTH CALL BACK NUMBER REASON FOR CALL**this is important as we prioritize the call backs  YOU WILL RECEIVE A CALL BACK THE SAME DAY AS LONG AS YOU CALL BEFORE 4:00 PM  At the [facility], you and your health needs are our priority. As part of our continuing mission to provide you with exceptional heart care, we have created designated Provider Care Teams. These Care Teams include your primary Cardiologist (physician) and Advanced Practice Providers (APPs- Physician Assistants and Nurse Practitioners) who all work together to provide you with the care you need, when you need it.   You may see any of the following providers on your designated Care Team at your next follow up: Dr Jules Oar Dr Peder Bourdon Dr. Alwin Baars Dr. Arta Lark Amy Marijane Shoulders, NP Ruddy Corral, [place] [facility] [place], [place] Dennise Fitz, NP [place] Lee, NP Shawnee Dellen, NP Luster Salters, PharmD Bevely Brush, PharmD   Please be sure to bring in all your medications bottles to every appointment.    Thank you for choosing [facility]

## 2024-03-12 NOTE — Progress Notes (Signed)
 [facility] Progress Note    PCP: Okey Carlin Redbird, MD Cardiology: Dr. Shlomo HF Cardiology: Dr. Rolan  Chief complaint: CHF  78 y.o. with history of chronic systolic CHF and paroxysmal atrial fibrillation was referred by Dr. Shlomo for evaluation of CHF.  Patient has been short of breath for years, worse over the last 6 months.  He has had an extensive workup.  Echo in 3/21 showed EF 45-50% with mildly decreased RV function.  Cardiolite  in 4/21 showed fixed apical defect.  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  PYP scan in 8/21 was not suggestive of TTR cardiac amyloidosis.   He had been in persistent atrial fibrillation for about 2 years.  He saw Dr. Cindie and was started on amiodarone .  DCCV was done in 10/21 back to NSR.  He was found to have M-spike on SPEP.  He saw Dr. Timmy who thought this was MGUS.   LHC/RHC in 12/21 showed occluded small OM2 and 80-90% stenosis mid PLOM (small caliber), no interventional target; normal filling pressures and preserved cardiac output.   He was unable to tolerate Jardiance  due to extreme dizziness. He is off both Coreg  and Toprol  XL due to lightheadedness/hypotension.   He developed recurrent atrial fibrillation and failed DCCV in 4/22.  He has been in persistent atrial fibrillation, Dr Cindie stopped his amiodarone  due to futility.  In 7/22, he had AV nodal ablation with St Jude CRT-P device placement.   Echo in 5/22 showed EF 35-40%, mild LV enlargement, normal RV.  Echo in 5/23 showed EF 35-40%, diffuse hypokinesis. RV mildly reduced.   He has had issues recently tolerating GDMT due to low BP.   Echo (6/24) showed EF 50-55%, normal RV (poor images, may not be accurate EF).  Echo 6/25 EF 35-40% with diffuse hypokinesis, mild RV dilation/mild RV dysfunction, mild MR, severe biatrial enlargement.   Patient returns for followup  of CHF.  Weight up 3 lbs.  Losartan  was stopped due to orthostatic symptoms.  He is doing much better now from this standpoint, rare lightheadedness with standing and no falls. No chest pain.  Mild dyspnea with a long walk.  He chronically gets short of breath with dressing.  Wearing compression stockings.  No orthopnea/PND.    St Jude device interrogation: >99% BiV paced, thoracic impedance stable (personally reviewed).     ECG (personally reviewed): Atrial fibrillation with BiV pacing.   Labs (6/25): LDL 41 Labs (8/25): BNP 93, K 4.2, creatinine 1.02, hgb 14.4  PMH: 1. CVA: Occurred in 3/21, he was not on apixaban  at the time.  2. GERD 3. OSA: Uses CPAP.  4. Hyperlipidemia 5. Atrial fibrillation: Permanent - DCCV to NSR in 10/21.  - Failed DCCV in 4/22, stopped amiodarone .  - AV nodal ablation with St Jude CRT-P in 7/22.  6. MGUS 7. Chronic systolic CHF: Echo (3/21) with EF 45-50%, mild LVH, mildly decreased RV systolic function. Primarily nonischemic cardiomyopathy.  - Cardiolite  (4/21): Fixed apical defect.  - Cardiac MRI (9/21): Mild LV dilation with mild LV hypertrophy, EF 39%, prominent trabeculations concerning for noncompaction. Mild RV dilation with EF 28%, severe LAE.  No delayed enhancement.  - PYP scan (8/21): grade 1, H/CL 1-1.5.  Unlikely to have cardiac amyloidosis.  - LHC/RHC (12/21): small OM2 totally occluded, 80-90% mid small PLOM (no interventional target); mean RA 5, PA 25/9, mean PCWP 5, CI  2.77 Fick/3.29 Thermo.  - St Jude CRT-P with AV nodal ablation.  - Echo (5/23): EF 35-40%, diffuse hypokinesis, mildly decreased RV systolic function, IVC normal.  - Echo (6/24): EF 50-55%, normal RV (poor images, ?accuracy). - Echo (6/25): EF 35-40% with diffuse hypokinesis, mild RV dilation/mild RV dysfunction, mild MR, severe biatrial enlargement.  8. Orthostatic hypotension.  9. CAD: Coronary angiography 12/21 with small OM2 totally occluded, 80-90% mid small PLOM (no  interventional target) 10. Carotid ultrasound (8/22): mild BICA stenosis.  11. COVID-19 9/22 12. PVCs  Social History   Socioeconomic History   Marital status: Married    Spouse name: Not on file   Number of children: Not on file   Years of education: Not on file   Highest education level: Not on file  Occupational History   Not on file  Tobacco Use   Smoking status: Never   Smokeless tobacco: Never  Vaping Use   Vaping status: Never Used  Substance and Sexual Activity   Alcohol use: Yes    Alcohol/week: 1.0 standard drink of alcohol    Types: 1 Cans of beer per week    Comment: Rare   Drug use: No   Sexual activity: Not Currently  Other Topics Concern   Not on file  Social History Narrative   Not on file   Social Drivers of Health   Financial Resource Strain: Not on file  Food Insecurity: Not on file  Transportation Needs: Not on file  Physical Activity: Not on file  Stress: Not on file  Social Connections: Not on file  Intimate Partner Violence: Not on file   Family History  Problem Relation Age of Onset   Lung cancer Mother        smoker   ROS: All systems reviewed and negative except as per HPI.   Current Outpatient Medications  Medication Sig Dispense Refill   acetaminophen  (TYLENOL ) 500 MG tablet Take 1,000 mg by mouth as needed.     apixaban  (ELIQUIS ) 5 MG TABS tablet Take 1 tablet (5 mg total) by mouth 2 (two) times daily. 60 tablet 0   benzonatate  (TESSALON ) 200 MG capsule TAKE 1 CAPSULE (200 MG TOTAL) BY MOUTH 3 (THREE) TIMES DAILY AS NEEDED FOR COUGH. 30 capsule 1   cetirizine (ZYRTEC) 10 MG tablet Take 10 mg by mouth at bedtime.     chlorpheniramine (CHLOR-TRIMETON) 4 MG tablet Take 4 mg by mouth 2 (two) times daily.     Cholecalciferol (VITAMIN D3) 50 MCG (2000 UT) TABS Take 2,000 Units by mouth daily.     empagliflozin  (JARDIANCE ) 10 MG TABS tablet Take 1 tablet (10 mg total) by mouth daily before breakfast. 90 tablet 1   Evolocumab  (REPATHA   SURECLICK) 140 MG/ML SOAJ INJECT 140MG  INTO THE SKIN EVERY 2 WEEKS 6 mL 3   ezetimibe  (ZETIA ) 10 MG tablet TAKE 1 TABLET BY MOUTH EVERY DAY 90 tablet 2   famotidine  (PEPCID ) 20 MG tablet Take 20 mg by mouth daily.     ferrous sulfate  325 (65 FE) MG tablet Take 325 mg by mouth in the morning.     fluticasone  (FLONASE ) 50 MCG/ACT nasal spray Place 2 sprays into both nostrils 2 (two) times daily.      furosemide  (LASIX ) 20 MG tablet Take 1 tablet (20 mg total) by mouth as directed. TAKE 1 TABLETS ONCE DAILY, MAY TAKE AN EXTRA TABLET DAILY IF NEEDED FOR SWELLING OR WEIGHT GAIN OF 3 POUNDS OVERNIGHT OR 5 POUNDS IN 1 WEEK  Glucosamine 750 MG TABS Take 750 mg by mouth every evening.     Glucosamine Sulfate 750 MG TABS Take 750 mg by mouth in the morning and at bedtime.     ipratropium (ATROVENT) 0.06 % nasal spray SMARTSIG:2 Spray(s) Both Nares Twice Daily PRN     Melatonin 10 MG TABS Take 10 mg by mouth at bedtime.      Multiple Vitamin (MULTIVITAMIN WITH MINERALS) TABS tablet Take 1 tablet by mouth every evening.     pantoprazole  (PROTONIX ) 40 MG tablet Take 1 tablet (40 mg total) by mouth daily. Take 30-60 min before first meal of the day 90 tablet 3   potassium chloride  SA (KLOR-CON  M) 20 MEQ tablet Take 1 tablet (20 mEq total) by mouth daily. 30 tablet 3   rosuvastatin  (CRESTOR ) 5 MG tablet 1 tablet Orally MWF for 90 days     sodium chloride  (OCEAN) 0.65 % SOLN nasal spray Place 1 spray into both nostrils as needed for congestion. 22.5 mL 0   tamsulosin  (FLOMAX ) 0.4 MG CAPS capsule Take 0.4 mg by mouth at bedtime.      eplerenone  (INSPRA ) 50 MG tablet Take 0.5 tablets (25 mg total) by mouth at bedtime. (Patient not taking: Reported on 03/11/2024)     Vericiguat  (VERQUVO ) 5 MG TABS TAKE 1 TABLET BY MOUTH EVERY DAY (Patient not taking: Reported on 03/11/2024) 90 tablet 3   No current facility-administered medications for this encounter.   BP 104/70   Pulse 69   Wt 129.9 kg (286 lb 6.4 oz)   SpO2  93%   BMI 39.94 kg/m   Wt Readings from Last 3 Encounters:  03/11/24 129.9 kg (286 lb 6.4 oz)  03/05/24 128.4 kg (283 lb)  01/31/24 128.5 kg (283 lb 6.4 oz)   PHYSICAL EXAM: General: NAD Neck: No JVD, no thyromegaly or thyroid  nodule.  Lungs: Clear to auscultation bilaterally with normal respiratory effort. CV: Nondisplaced PMI.  Heart regular S1/S2, no S3/S4, no murmur.  No peripheral edema.  No carotid bruit.  Normal pedal pulses.  Abdomen: Soft, nontender, no hepatosplenomegaly, no distention.  Skin: Intact without lesions or rashes.  Neurologic: Alert and oriented x 3.  Psych: Normal affect. Extremities: No clubbing or cyanosis.  HEENT: Normal.   Assessment/Plan: 1. Chronic systolic CHF: Cardiac MRI in 9/21 with EF 39% and evidence for noncompaction cardiomyopathy, no delayed enhancement, significant RV dysfunction with RV EF 22%.  PYP scan was not suggestive of TTR amyloidosis and no symptoms or peripheral neuropathy.  12/21 LHC showed CAD but does not explain cardiomyopathy, no interventional target.  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  Echo 10/2021 showed  EF 35-40%, diffuse hypokinesis, mildly decreased RV systolic function, IVC normal. He has a history of orthostatic hypotension which has limited medication titration but this is doing better since AV nodal ablation with St Jude CRT-P.  Echo (6/24) showed EF 50-55%, normal RV. However, this was a difficult study, and echo 6/25 showed EF 35-40% with diffuse HK, mild RV dilation/mild RV dysfunction, more in line with prior echoes.  NYHA class II-III chronically.  Not volume overloaded by exam or Corvue.  - Continue Lasix  20 mg daily.  BMET/BNP today.  - Continue Jardiance  10 mg daily.  - Continue eplerenone  50 mg qhs.   - Losartan  stopped due to lightheadedness/orthostasis.  - Unable to tolerate Coreg  or Toprol  XL due to lightheadedness/low BP.  - Not candidate for cardiac contractility modulation  due to CRT.  -  Now off Verquvo  due to lightheadedness.  - Continue compression hose. 2. Atrial fibrillation: Permanent.  He has severe LAE. Not good candidate for atrial fibrillation ablation per EP.  He failed DCCV on amiodarone  in 4/22, amiodarone  stopped.  AV nodal ablation with St Jude CRT-P in 7/22. - Continue apixaban  5 mg bid.  3. OSA: Continue CPAP.  4. Orthostatic hypotension: Limits medications.   - Continue compression stockings.  - Encouraged slow positional changes. 5. CAD: Moderate disease on cath in 12/21, no interventional target.  No chest pain.  Extent of disease does not explain cardiomyopathy.   - Continue Crestor  5 mg qod and Repatha . Good lipids in 6/25.  - He is on apixaban  with stable CAD, so no ASA.   Follow up 3 months with APP.   I spent 31 minutes reviewing records, interviewing/examining patient, and managing orders.   Ezra Shuck,  03/12/2024

## 2024-03-13 ENCOUNTER — Encounter: Payer: Self-pay | Admitting: Cardiology

## 2024-03-13 LAB — CUP PACEART REMOTE DEVICE CHECK
Battery Remaining Longevity: 67 mo
Battery Remaining Percentage: 67 %
Battery Voltage: 3.01 V
Date Time Interrogation Session: 20251006020010
Implantable Lead Connection Status: 753985
Implantable Lead Connection Status: 753985
Implantable Lead Implant Date: 20220708
Implantable Lead Implant Date: 20220708
Implantable Lead Location: 753858
Implantable Lead Location: 753860
Implantable Pulse Generator Implant Date: 20220708
Lead Channel Impedance Value: 1175 Ohm
Lead Channel Impedance Value: 590 Ohm
Lead Channel Pacing Threshold Amplitude: 0.75 V
Lead Channel Pacing Threshold Amplitude: 1 V
Lead Channel Pacing Threshold Pulse Width: 0.5 ms
Lead Channel Pacing Threshold Pulse Width: 0.5 ms
Lead Channel Sensing Intrinsic Amplitude: 8.6 mV
Lead Channel Setting Pacing Amplitude: 2.5 V
Lead Channel Setting Pacing Amplitude: 2.5 V
Lead Channel Setting Pacing Pulse Width: 0.5 ms
Lead Channel Setting Pacing Pulse Width: 0.5 ms
Lead Channel Setting Sensing Sensitivity: 4 mV
Pulse Gen Model: 3562
Pulse Gen Serial Number: 3913632

## 2024-03-13 NOTE — Progress Notes (Signed)
 Remote PPM Transmission

## 2024-03-18 ENCOUNTER — Ambulatory Visit: Payer: Self-pay | Admitting: Cardiology

## 2024-03-18 NOTE — Progress Notes (Signed)
 Remote PPM Transmission

## 2024-03-18 NOTE — Progress Notes (Unsigned)
  Electrophysiology Office Follow up Visit Note:    Date:  03/19/2024   ID:  Joshua Moran., DOB 21-Nov-1945, MRN [id number]  PCP:  Joshua Carlin Redbird, MD  [facility] Cardiologist:  Joshua Bihari, MD  [facility] Electrophysiologist:  Joshua ONEIDA HOLTS, MD    Interval History:     Joshua Moran. is a 78 y.o. male who presents for a follow up visit.    I last saw the patient March 29, 2022.  He has a history of CRT with an AV nodal ablation in 2022.  He takes Eliquis  for stroke prophylaxis.  He is with his wife today in clinic.  He is doing well.  He thinks that his dizziness is 90% better.       Past medical, surgical, social and family history were reviewed.  ROS:   Please see the history of present illness.    All other systems reviewed and are negative.  EKGs/Labs/Other Studies Reviewed:    The following studies were reviewed today:  November 14, 2023 echo EF 35-40 RV mildly reduced Severely dilated left atrium  March 19, 2024 in-clinic device interrogation personally reviewed Battery and lead parameter stable No programming changes made today        Physical Exam:    VS:  BP 100/62 (BP Location: Left Arm, Patient Position: Sitting, Cuff Size: Large)   Pulse 70   Ht 5' 11 (1.803 m)   Wt 280 lb (127 kg)   SpO2 98%   BMI 39.05 kg/m     Wt Readings from Last 3 Encounters:  03/19/24 280 lb (127 kg)  03/11/24 286 lb 6.4 oz (129.9 kg)  03/05/24 283 lb (128.4 kg)     GEN: no distress CARD: RRR, No MRG.  Device pocket well-healed RESP: No IWOB. CTAB.      ASSESSMENT:    1. Chronic systolic heart failure (HCC)   2. Cardiac resynchronization therapy pacemaker (CRT-P) in place   3. Permanent atrial fibrillation (HCC)    PLAN:    In order of problems listed above:  #Permanent atrial fibrillation #CRT-D in situ #Post AV nodal ablation His device is functioning appropriately.  Continue remote monitoring. Continue Eliquis   #Chronic  systolic heart failure EF 35-40 on June 2025 echo.  NYHA class III symptoms.  Follows with heart failure clinic. Continue Lasix , eplerenone , Jardiance   I discussed my upcoming departure from [facility] today.  He will continue to follow-up with Dr. Almetta moving forward.  Follow-up 1 year with the EP MD.    Signed, Joshua HOLTS, MD, [facility], [facility] 03/19/2024 3:04 PM    Electrophysiology [facility]

## 2024-03-19 ENCOUNTER — Ambulatory Visit: Attending: Cardiology | Admitting: Cardiology

## 2024-03-19 ENCOUNTER — Encounter: Payer: Self-pay | Admitting: Cardiology

## 2024-03-19 VITALS — BP 100/62 | HR 70 | Ht 71.0 in | Wt 280.0 lb

## 2024-03-19 DIAGNOSIS — Z950 Presence of cardiac pacemaker: Secondary | ICD-10-CM

## 2024-03-19 DIAGNOSIS — I5022 Chronic systolic (congestive) heart failure: Secondary | ICD-10-CM | POA: Diagnosis not present

## 2024-03-19 DIAGNOSIS — I4821 Permanent atrial fibrillation: Secondary | ICD-10-CM

## 2024-03-19 LAB — CUP PACEART INCLINIC DEVICE CHECK
Battery Remaining Longevity: 67 mo
Battery Voltage: 3.01 V
Brady Statistic RA Percent Paced: 0 %
Brady Statistic RV Percent Paced: 99.22 %
Date Time Interrogation Session: 20251014164147
Implantable Lead Connection Status: 753985
Implantable Lead Connection Status: 753985
Implantable Lead Implant Date: 20220708
Implantable Lead Implant Date: 20220708
Implantable Lead Location: 753858
Implantable Lead Location: 753860
Implantable Pulse Generator Implant Date: 20220708
Lead Channel Impedance Value: 1262.5 Ohm
Lead Channel Impedance Value: 612.5 Ohm
Lead Channel Pacing Threshold Amplitude: 0.5 V
Lead Channel Pacing Threshold Amplitude: 1 V
Lead Channel Pacing Threshold Pulse Width: 0.5 ms
Lead Channel Pacing Threshold Pulse Width: 0.5 ms
Lead Channel Sensing Intrinsic Amplitude: 7.2 mV
Lead Channel Setting Pacing Amplitude: 2 V
Lead Channel Setting Pacing Amplitude: 2 V
Lead Channel Setting Pacing Pulse Width: 0.5 ms
Lead Channel Setting Pacing Pulse Width: 0.5 ms
Lead Channel Setting Sensing Sensitivity: 4 mV
Pulse Gen Model: 3562
Pulse Gen Serial Number: 3913632

## 2024-03-19 NOTE — Patient Instructions (Signed)
 Medication Instructions:  Your physician recommends that you continue on your current medications as directed. Please refer to the Current Medication list given to you today.  *If you need a refill on your cardiac medications before your next appointment, please call your pharmacy*  Follow-Up: At [facility], you and your health needs are our priority.  As part of our continuing mission to provide you with exceptional heart care, our providers are all part of one team.  This team includes your primary Cardiologist (physician) and Advanced Practice Providers or APPs (Physician Assistants and Nurse Practitioners) who all work together to provide you with the care you need, when you need it.  Your next appointment:   1 year  Provider:   Donnice Primus, MD

## 2024-03-20 ENCOUNTER — Ambulatory Visit: Payer: Self-pay | Admitting: Cardiology

## 2024-03-25 DIAGNOSIS — R972 Elevated prostate specific antigen [PSA]: Secondary | ICD-10-CM | POA: Diagnosis not present

## 2024-03-29 DIAGNOSIS — G4733 Obstructive sleep apnea (adult) (pediatric): Secondary | ICD-10-CM | POA: Diagnosis not present

## 2024-04-01 DIAGNOSIS — R972 Elevated prostate specific antigen [PSA]: Secondary | ICD-10-CM | POA: Diagnosis not present

## 2024-04-01 DIAGNOSIS — R3915 Urgency of urination: Secondary | ICD-10-CM | POA: Diagnosis not present

## 2024-04-01 DIAGNOSIS — R351 Nocturia: Secondary | ICD-10-CM | POA: Diagnosis not present

## 2024-04-01 DIAGNOSIS — N401 Enlarged prostate with lower urinary tract symptoms: Secondary | ICD-10-CM | POA: Diagnosis not present

## 2024-04-03 NOTE — Telephone Encounter (Signed)
 It was confirmed with Joshua Moran that pt has received his cpap supplies on 04/01/24. Nasal cushion mask, 6 cushions, headgear.

## 2024-04-04 DIAGNOSIS — Z23 Encounter for immunization: Secondary | ICD-10-CM | POA: Diagnosis not present

## 2024-04-08 ENCOUNTER — Ambulatory Visit: Attending: Cardiology

## 2024-04-08 DIAGNOSIS — Z950 Presence of cardiac pacemaker: Secondary | ICD-10-CM

## 2024-04-08 DIAGNOSIS — I5022 Chronic systolic (congestive) heart failure: Secondary | ICD-10-CM | POA: Diagnosis not present

## 2024-04-12 ENCOUNTER — Telehealth: Payer: Self-pay

## 2024-04-12 NOTE — Telephone Encounter (Signed)
 Remote ICM transmission received.  Attempted call to patient regarding ICM remote transmission.  Left detailed message per DPR with ICM phone number to return call for any questions, concerns or fluid symptoms.

## 2024-04-12 NOTE — Progress Notes (Signed)
 EPIC Encounter for ICM Monitoring  Patient Name: Joshua Moran. is a 78 y.o. male Date: 04/12/2024 Primary Care Physican: Okey Carlin Redbird, MD Primary Cardiologist: Rolan Electrophysiologist: Cindie BiV Pacing: >99% 10/19/2022 Weight: 279 lbs (276 lbs) 07/13/2023 Weight: 282 lbs     08/16/2023 Weight: 284 lbs   09/18/2023 Weight: 283 lbs   12/18/2023 Weight: 282.7 lbs  (was 295.2 lbs)    12/27/2023 Weight: 279.7 lbs    01/04/2024 Weight: 279 lbs     03/06/2024 Weight: 276.8 lbs                                 Attempted call to patient and unable to reach.  Left detailed message per DPR regarding transmission.  Transmission results reviewed.       Diet:  He eats at restaurants several times a week and aware the foods are higher in salt.     Since 03/04/2024 ICM Remote Transmission: CorVue thoracic impedance suggesting normal fluid levels.     Prescribed:  Furosemide  20 mg take 1 tablet(s) (20 mg total) by mouth daily + may take an extra tablet daily if needed for swelling or weight gain of 3 pounds overnight or 5 pounds in a week. Potassium 20 mEq take 1 tablet (s) (20 mEq Total) by mouth daily    Labs: 03/11/2024 Creatinine 1.09, BUN 12, Potassium 4.2, Sodium 139, GFR >60 01/31/2024 Creatinine 1.02, BUN 14, Potassium 4.2, Sodium 138, GFR >60 12/26/2023 Creatinine 1.05, BUN 16, Potassium 4.6, Sodium 139, GFR >60 12/13/2023 Creatinine 0.92, BUN 14, Potassium 4.1, Sodium 138, GFR >60, BNP 124.2 11/23/2023 Creatinine 1.13, BUN 15. Potassium 4.8, Sodium 142, GFR >60 11/13/2023 BNP 128.2 A complete set of results can be found in Results Review.   Recommendations:   Left voice mail with ICM number and encouraged to call if experiencing any fluid symptoms.   Follow-up plan: ICM clinic phone appointment on 05/20/2024.   91 day device clinic remote transmission 06/10/2024.     EP/Cardiology Office Visits:  06/11/2024 with Dr Rolan.  Provided EP scheduler number to make office appointment.   Recall 03/14/2025 with Dr Almetta or EPP APP.    Copy of ICM check sent to Dr. Cindie.      Remote monitoring is medically necessary for Heart Failure Management.    Daily Thoracic Impedance Direct trend: 01/14/2024 through 04/07/2024.    [name], RN 04/12/2024 9:54 AM

## 2024-04-16 ENCOUNTER — Other Ambulatory Visit: Payer: Self-pay | Admitting: Internal Medicine

## 2024-04-16 DIAGNOSIS — R058 Other specified cough: Secondary | ICD-10-CM

## 2024-04-26 ENCOUNTER — Other Ambulatory Visit (HOSPITAL_COMMUNITY): Payer: Self-pay | Admitting: Cardiology

## 2024-05-11 ENCOUNTER — Other Ambulatory Visit (HOSPITAL_COMMUNITY): Payer: Self-pay | Admitting: Cardiology

## 2024-05-20 ENCOUNTER — Telehealth: Payer: Self-pay

## 2024-05-20 ENCOUNTER — Ambulatory Visit

## 2024-05-20 DIAGNOSIS — I5022 Chronic systolic (congestive) heart failure: Secondary | ICD-10-CM

## 2024-05-20 DIAGNOSIS — Z950 Presence of cardiac pacemaker: Secondary | ICD-10-CM

## 2024-05-20 NOTE — Telephone Encounter (Signed)
 Remote ICM transmission received.  Attempted call to patient regarding ICM remote transmission.  Left detailed message per DPR with ICM phone number to return call for any questions, concerns or fluid symptoms.

## 2024-05-20 NOTE — Progress Notes (Cosign Needed)
 EPIC Encounter for ICM Monitoring  Patient Name: Joshua Moran. is a 78 y.o. male Date: 05/20/2024 Primary Care Physican: Okey Carlin Redbird, MD Primary Cardiologist: Rolan Electrophysiologist: Kennyth BiV Pacing: >99% 10/19/2022 Weight: 279 lbs (276 lbs) 07/13/2023 Weight: 282 lbs     08/16/2023 Weight: 284 lbs   09/18/2023 Weight: 283 lbs   12/18/2023 Weight: 282.7 lbs  (was 295.2 lbs)    12/27/2023 Weight: 279.7 lbs    01/04/2024 Weight: 279 lbs     03/06/2024 Weight: 276.8 lbs                                 Attempted call to patient and unable to reach.  Left detailed message per DPR regarding transmission.  Transmission results reviewed.       Diet:  He eats at restaurants several times a week and aware the foods are higher in salt.     Since 04/08/2024 ICM Remote Transmission: CorVue thoracic impedance suggesting possible fluid accumulation starting 05/11/2024 and returned to baseline 05/20/2024.     Prescribed:  Furosemide  20 mg take 1 tablet(s) (20 mg total) by mouth daily + may take an extra tablet daily if needed for swelling or weight gain of 3 pounds overnight or 5 pounds in a week. Potassium 20 mEq take 1 tablet (s) (20 mEq Total) by mouth daily    Labs: 03/11/2024 Creatinine 1.09, BUN 12, Potassium 4.2, Sodium 139, GFR >60 01/31/2024 Creatinine 1.02, BUN 14, Potassium 4.2, Sodium 138, GFR >60 12/26/2023 Creatinine 1.05, BUN 16, Potassium 4.6, Sodium 139, GFR >60 12/13/2023 Creatinine 0.92, BUN 14, Potassium 4.1, Sodium 138, GFR >60, BNP 124.2 11/23/2023 Creatinine 1.13, BUN 15. Potassium 4.8, Sodium 142, GFR >60 11/13/2023 BNP 128.2 A complete set of results can be found in Results Review.   Recommendations:   Left voice mail with ICM number and encouraged to call if experiencing any fluid symptoms.   Follow-up plan: ICM clinic phone appointment on 06/24/2024.   91 day device clinic remote transmission 06/10/2024.     EP/Cardiology Office Visits:  06/11/2024 with Dr Rolan.   Provided EP scheduler number to make office appointment.  Recall 03/14/2025 with Dr Almetta or EPP APP.    Copy of ICM check sent to Dr. Kennyth.     Remote monitoring is medically necessary for Heart Failure Management.    Daily Thoracic Impedance ICM trend: 02/20/2024 through 05/20/2024.    12-14 Month Thoracic Impedance ICM trend:     [name], RN 05/20/2024 4:48 PM

## 2024-06-10 ENCOUNTER — Telehealth (HOSPITAL_COMMUNITY): Payer: Self-pay

## 2024-06-10 ENCOUNTER — Ambulatory Visit: Payer: PPO

## 2024-06-10 DIAGNOSIS — I5022 Chronic systolic (congestive) heart failure: Secondary | ICD-10-CM

## 2024-06-10 NOTE — Progress Notes (Addendum)
 "   Advanced Heart Failure Clinic Note    PCP: Okey Carlin Redbird, MD Cardiology: Dr. Shlomo HF Cardiology: Dr. Rolan  79 y.o. with history of chronic systolic CHF and paroxysmal atrial fibrillation was referred by Dr. Shlomo for evaluation of CHF.  Patient has been short of breath for years, worse over the last 6 months.  He has had an extensive workup.  Echo in 3/21 showed EF 45-50% with mildly decreased RV function.  Cardiolite  in 4/21 showed fixed apical defect.  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  PYP scan in 8/21 was not suggestive of TTR cardiac amyloidosis.   He had been in persistent atrial fibrillation for about 2 years.  He saw Dr. Cindie and was started on amiodarone .  DCCV was done in 10/21 back to NSR.  He was found to have M-spike on SPEP.  He saw Dr. Timmy who thought this was MGUS.   LHC/RHC in 12/21 showed occluded small OM2 and 80-90% stenosis mid PLOM (small caliber), no interventional target; normal filling pressures and preserved cardiac output.   He was unable to tolerate Jardiance  due to extreme dizziness. He is off both Coreg  and Toprol  XL due to lightheadedness/hypotension.   He developed recurrent atrial fibrillation and failed DCCV in 4/22.  He has been in persistent atrial fibrillation, Dr Cindie stopped his amiodarone  due to futility.  In 7/22, he had AV nodal ablation with St Jude CRT-P device placement.   Echo in 5/22 showed EF 35-40%, mild LV enlargement, normal RV.  Echo in 5/23 showed EF 35-40%, diffuse hypokinesis. RV mildly reduced.   He has had issues recently tolerating GDMT due to low BP.   Echo (6/24) showed EF 50-55%, normal RV (poor images, may not be accurate EF).  Echo 6/25 EF 35-40% with diffuse hypokinesis, mild RV dilation/mild RV dysfunction, mild MR, severe biatrial enlargement.   Today she returns for HF follow up with his wife. Overall  feeling fine. Has SOB walking up steps or up inclines, starting back working out on TM. Notices upper thigh soreness, asking if it could be related to his statin and if he can stop it. Denies palpitations, abnormal bleeding, CP, dizziness, edema, or PND/Orthopnea. Appetite ok. Weight at home 279 pounds. Taking all medications. Does not wear CPAP. Asking to stop his KCL supplement.  St Jude device interrogation (personally reviewed): >99% BiV paced, stable thoracic impedance.  ECG (personally reviewed): none ordered today.  Labs (6/25): LDL 41 Labs (8/25): BNP 93, K 4.2, creatinine 1.02, hgb 14.4 Labs (10/25): K 4.2, creatinine 1.09  PMH: 1. CVA: Occurred in 3/21, he was not on apixaban  at the time.  2. GERD 3. OSA: unable to tolerate CPAP.  4. Hyperlipidemia 5. Atrial fibrillation: Permanent - DCCV to NSR in 10/21.  - Failed DCCV in 4/22, stopped amiodarone .  - AV nodal ablation with St Jude CRT-P in 7/22.  6. MGUS 7. Chronic systolic CHF: Echo (3/21) with EF 45-50%, mild LVH, mildly decreased RV systolic function. Primarily nonischemic cardiomyopathy.  - Cardiolite  (4/21): Fixed apical defect.  - Cardiac MRI (9/21): Mild LV dilation with mild LV hypertrophy, EF 39%, prominent trabeculations concerning for noncompaction. Mild RV dilation with EF 28%, severe LAE.  No delayed enhancement.  - PYP scan (8/21): grade 1, H/CL 1-1.5.  Unlikely to have cardiac amyloidosis.  - LHC/RHC (12/21): small OM2 totally occluded, 80-90% mid small PLOM (no interventional target);  mean RA 5, PA 25/9, mean PCWP 5, CI 2.77 Fick/3.29 Thermo.  - St Jude CRT-P with AV nodal ablation.  - Echo (5/23): EF 35-40%, diffuse hypokinesis, mildly decreased RV systolic function, IVC normal.  - Echo (6/24): EF 50-55%, normal RV (poor images, ?accuracy). - Echo (6/25): EF 35-40% with diffuse hypokinesis, mild RV dilation/mild RV dysfunction, mild MR, severe biatrial enlargement.  8. Orthostatic hypotension.  9. CAD:  Coronary angiography 12/21 with small OM2 totally occluded, 80-90% mid small PLOM (no interventional target) 10. Carotid ultrasound (8/22): mild BICA stenosis.  11. COVID-19 9/22 12. PVCs  Social History   Socioeconomic History   Marital status: Married    Spouse name: Not on file   Number of children: Not on file   Years of education: Not on file   Highest education level: Not on file  Occupational History   Not on file  Tobacco Use   Smoking status: Never   Smokeless tobacco: Never  Vaping Use   Vaping status: Never Used  Substance and Sexual Activity   Alcohol use: Yes    Alcohol/week: 1.0 standard drink of alcohol    Types: 1 Cans of beer per week    Comment: Rare   Drug use: No   Sexual activity: Not Currently  Other Topics Concern   Not on file  Social History Narrative   Not on file   Social Drivers of Health   Tobacco Use: Low Risk (03/19/2024)   Patient History    Smoking Tobacco Use: Never    Smokeless Tobacco Use: Never    Passive Exposure: Not on file  Financial Resource Strain: Not on file  Food Insecurity: Not on file  Transportation Needs: Not on file  Physical Activity: Not on file  Stress: Not on file  Social Connections: Not on file  Intimate Partner Violence: Not on file  Depression (EYV7-0): Not on file  Alcohol Screen: Not on file  Housing: Not on file  Utilities: Not on file  Health Literacy: Not on file   Family History  Problem Relation Age of Onset   Lung cancer Mother        smoker   ROS: All systems reviewed and negative except as per HPI.   Current Outpatient Medications  Medication Sig Dispense Refill   acetaminophen  (TYLENOL ) 500 MG tablet Take 1,000 mg by mouth as needed.     apixaban  (ELIQUIS ) 5 MG TABS tablet Take 1 tablet (5 mg total) by mouth 2 (two) times daily. 60 tablet 0   benzonatate  (TESSALON ) 200 MG capsule TAKE 1 CAPSULE (200 MG TOTAL) BY MOUTH 3 (THREE) TIMES DAILY AS NEEDED FOR COUGH. 30 capsule 1    cetirizine (ZYRTEC) 10 MG tablet Take 10 mg by mouth at bedtime.     chlorpheniramine (CHLOR-TRIMETON) 4 MG tablet Take 4 mg by mouth 2 (two) times daily.     Cholecalciferol (VITAMIN D3) 50 MCG (2000 UT) TABS Take 2,000 Units by mouth daily.     eplerenone  (INSPRA ) 50 MG tablet TAKE 1 TABLET BY MOUTH EVERYDAY AT BEDTIME 90 tablet 1   Evolocumab  (REPATHA  SURECLICK) 140 MG/ML SOAJ INJECT 140MG  INTO THE SKIN EVERY 2 WEEKS 6 mL 3   ezetimibe  (ZETIA ) 10 MG tablet TAKE 1 TABLET BY MOUTH EVERY DAY 90 tablet 2   famotidine  (PEPCID ) 20 MG tablet Take 20 mg by mouth daily.     ferrous sulfate  325 (65 FE) MG tablet Take 325 mg by mouth in the morning.  fluticasone  (FLONASE ) 50 MCG/ACT nasal spray Place 2 sprays into both nostrils 2 (two) times daily.      furosemide  (LASIX ) 20 MG tablet Take 1 tablet (20 mg total) by mouth as directed. TAKE 1 TABLETS ONCE DAILY, MAY TAKE AN EXTRA TABLET DAILY IF NEEDED FOR SWELLING OR WEIGHT GAIN OF 3 POUNDS OVERNIGHT OR 5 POUNDS IN 1 WEEK     Glucosamine 750 MG TABS Take 750 mg by mouth every evening.     Glucosamine Sulfate 750 MG TABS Take 750 mg by mouth in the morning and at bedtime.     ipratropium (ATROVENT) 0.06 % nasal spray SMARTSIG:2 Spray(s) Both Nares Twice Daily PRN     JARDIANCE  10 MG TABS tablet TAKE 1 TABLET BY MOUTH DAILY BEFORE BREAKFAST. 90 tablet 1   Melatonin 10 MG TABS Take 10 mg by mouth at bedtime.      Multiple Vitamin (MULTIVITAMIN WITH MINERALS) TABS tablet Take 1 tablet by mouth every evening.     pantoprazole  (PROTONIX ) 40 MG tablet Take 1 tablet (40 mg total) by mouth daily. Take 30-60 min before first meal of the day 90 tablet 3   potassium chloride  SA (KLOR-CON  M) 20 MEQ tablet Take 1 tablet (20 mEq total) by mouth daily. 30 tablet 3   rosuvastatin  (CRESTOR ) 5 MG tablet 1 tablet Orally MWF for 90 days     sodium chloride  (OCEAN) 0.65 % SOLN nasal spray Place 1 spray into both nostrils as needed for congestion. 22.5 mL 0   tamsulosin   (FLOMAX ) 0.4 MG CAPS capsule Take 0.4 mg by mouth at bedtime.      Vericiguat  (VERQUVO ) 5 MG TABS TAKE 1 TABLET BY MOUTH EVERY DAY (Patient not taking: Reported on 03/19/2024) 90 tablet 3   No current facility-administered medications for this visit.   Wt Readings from Last 3 Encounters:  03/19/24 127 kg (280 lb)  03/11/24 129.9 kg (286 lb 6.4 oz)  03/05/24 128.4 kg (283 lb)   BP 112/82   Pulse 70   Ht 5' 11 (1.803 m)   Wt 130.3 kg (287 lb 3.2 oz)   SpO2 94%   BMI 40.06 kg/m   PHYSICAL EXAM: General:  NAD. No resp difficulty, walked into clinic HEENT: Normal Neck: Supple. No JVD. Cor: Regular rate & rhythm. No rubs, gallops or murmurs. Lungs: Clear Abdomen: Soft, obese, nontender, nondistended.  Extremities: No cyanosis, clubbing, rash, edema Neuro: Alert & oriented x 3, moves all 4 extremities w/o difficulty. Affect pleasant.  Assessment/Plan: 1. Chronic systolic CHF: Cardiac MRI in 9/21 with EF 39% and evidence for noncompaction cardiomyopathy, no delayed enhancement, significant RV dysfunction with RV EF 22%.  PYP scan was not suggestive of TTR amyloidosis and no symptoms or peripheral neuropathy.  12/21 LHC showed CAD but does not explain cardiomyopathy, no interventional target.  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  Echo 10/2021 showed  EF 35-40%, diffuse hypokinesis, mildly decreased RV systolic function, IVC normal. He has a history of orthostatic hypotension which has limited medication titration but this is doing better since AV nodal ablation with St Jude CRT-P.  Echo (6/24) showed EF 50-55%, normal RV. However, this was a difficult study, and echo 6/25 showed EF 35-40% with diffuse HK, mild RV dilation/mild RV dysfunction, more in line with prior echoes.  NYHA class II-III chronically. He is not volume overloaded by exam or Corvue.  - Continue Lasix  20 mg daily, can stop KCL. BMET today, repeat BMET in 7-10 days.  If he requires KCL supp,  change tablets to 10 mEq coated tablets. - Continue Jardiance  10 mg daily.  - Continue eplerenone  50 mg qhs.   - Losartan  stopped due to lightheadedness/orthostasis.  - Unable to tolerate Coreg  or Toprol  XL due to lightheadedness/low BP.  - Not candidate for cardiac contractility modulation due to CRT.  - Now off Verquvo  due to lightheadedness.  - Continue compression hose. - update echo next visit. 2. Atrial fibrillation: Permanent.  He has severe LAE. Not good candidate for atrial fibrillation ablation per EP.  He failed DCCV on amiodarone  in 4/22, amiodarone  stopped.  AV nodal ablation with St Jude CRT-P in 7/22. - Continue apixaban  5 mg bid. No bleeding issues. 3. OSA: Unable to tolerate CPAP.  - Follows with Dr Shlomo, ? [facility] but BMI may be prohibitive. 4. Orthostatic hypotension: Limits medications.   - Continue compression stockings.  - Encouraged slow positional changes. 5. CAD: Moderate disease on cath in 12/21, no interventional target.  No chest pain.  Extent of disease does not explain cardiomyopathy.   - Continue Crestor  5 mg qod and Repatha . Good lipids in 6/25.  - He is on apixaban  with stable CAD, so no ASA.  - With upper thigh soreness, check LFTs and CK, though he has tolerated low dose Crestor  x years, doubt statin-induced myalgia  Follow up in 4 months with Dr. Rolan + echo  Harlene CHRISTELLA Gainer,  06/10/2024 "

## 2024-06-10 NOTE — Telephone Encounter (Signed)
 Called to confirm/remind patient of their appointment at the [facility] Failure Clinic on 06/11/24.   Appointment:   [] Confirmed  [x] Left mess   [] No answer/No voice mail  [] VM Full/unable to leave message  [] Phone not in service  And to bring in all medications and/or complete list.

## 2024-06-11 ENCOUNTER — Ambulatory Visit (HOSPITAL_COMMUNITY): Payer: Self-pay | Admitting: Family Medicine

## 2024-06-11 ENCOUNTER — Ambulatory Visit (HOSPITAL_COMMUNITY)
Admission: RE | Admit: 2024-06-11 | Discharge: 2024-06-11 | Disposition: A | Discharge status: Home / Self Care | Source: Ambulatory Visit | Attending: Family Medicine | Admitting: Family Medicine

## 2024-06-11 ENCOUNTER — Encounter (HOSPITAL_COMMUNITY): Payer: Self-pay

## 2024-06-11 VITALS — BP 112/82 | HR 70 | Ht 71.0 in | Wt 287.2 lb

## 2024-06-11 DIAGNOSIS — G4733 Obstructive sleep apnea (adult) (pediatric): Secondary | ICD-10-CM | POA: Insufficient documentation

## 2024-06-11 DIAGNOSIS — Z7901 Long term (current) use of anticoagulants: Secondary | ICD-10-CM | POA: Insufficient documentation

## 2024-06-11 DIAGNOSIS — I951 Orthostatic hypotension: Secondary | ICD-10-CM | POA: Diagnosis not present

## 2024-06-11 DIAGNOSIS — I2510 Atherosclerotic heart disease of native coronary artery without angina pectoris: Secondary | ICD-10-CM | POA: Diagnosis not present

## 2024-06-11 DIAGNOSIS — I480 Paroxysmal atrial fibrillation: Secondary | ICD-10-CM | POA: Diagnosis present

## 2024-06-11 DIAGNOSIS — I4821 Permanent atrial fibrillation: Secondary | ICD-10-CM | POA: Diagnosis not present

## 2024-06-11 DIAGNOSIS — I424 Endocardial fibroelastosis: Secondary | ICD-10-CM | POA: Insufficient documentation

## 2024-06-11 DIAGNOSIS — Z95810 Presence of automatic (implantable) cardiac defibrillator: Secondary | ICD-10-CM | POA: Diagnosis not present

## 2024-06-11 DIAGNOSIS — Z7984 Long term (current) use of oral hypoglycemic drugs: Secondary | ICD-10-CM | POA: Insufficient documentation

## 2024-06-11 DIAGNOSIS — Z79899 Other long term (current) drug therapy: Secondary | ICD-10-CM | POA: Insufficient documentation

## 2024-06-11 DIAGNOSIS — I5022 Chronic systolic (congestive) heart failure: Secondary | ICD-10-CM | POA: Insufficient documentation

## 2024-06-11 LAB — CUP PACEART REMOTE DEVICE CHECK
Battery Remaining Longevity: 71 mo
Battery Remaining Percentage: 64 %
Battery Voltage: 3.01 V
Date Time Interrogation Session: 20260105020009
Implantable Lead Connection Status: 753985
Implantable Lead Connection Status: 753985
Implantable Lead Implant Date: 20220708
Implantable Lead Implant Date: 20220708
Implantable Lead Location: 753858
Implantable Lead Location: 753860
Implantable Pulse Generator Implant Date: 20220708
Lead Channel Impedance Value: 1175 Ohm
Lead Channel Impedance Value: 610 Ohm
Lead Channel Pacing Threshold Amplitude: 0.5 V
Lead Channel Pacing Threshold Amplitude: 1.125 V
Lead Channel Pacing Threshold Pulse Width: 0.5 ms
Lead Channel Pacing Threshold Pulse Width: 0.5 ms
Lead Channel Sensing Intrinsic Amplitude: 7.2 mV
Lead Channel Setting Pacing Amplitude: 2 V
Lead Channel Setting Pacing Amplitude: 2.125
Lead Channel Setting Pacing Pulse Width: 0.5 ms
Lead Channel Setting Pacing Pulse Width: 0.5 ms
Lead Channel Setting Sensing Sensitivity: 4 mV
Pulse Gen Model: 3562
Pulse Gen Serial Number: 3913632

## 2024-06-11 LAB — COMPREHENSIVE METABOLIC PANEL WITH GFR
ALT: 18 U/L (ref 0–44)
AST: 27 U/L (ref 15–41)
Albumin: 3.7 g/dL (ref 3.5–5.0)
Alkaline Phosphatase: 76 U/L (ref 38–126)
Anion gap: 9 (ref 5–15)
BUN: 13 mg/dL (ref 8–23)
CO2: 24 mmol/L (ref 22–32)
Calcium: 8.9 mg/dL (ref 8.9–10.3)
Chloride: 106 mmol/L (ref 98–111)
Creatinine, Ser: 0.91 mg/dL (ref 0.61–1.24)
GFR, Estimated: >60 mL/min
Glucose, Bld: 99 mg/dL (ref 70–99)
Potassium: 4.4 mmol/L (ref 3.5–5.1)
Sodium: 139 mmol/L (ref 135–145)
Total Bilirubin: 0.5 mg/dL (ref 0.0–1.2)
Total Protein: 6.8 g/dL (ref 6.5–8.1)

## 2024-06-11 LAB — CBC
HCT: 46.2 % (ref 39.0–52.0)
Hemoglobin: 15.4 g/dL (ref 13.0–17.0)
MCH: 31.4 pg (ref 26.0–34.0)
MCHC: 33.3 g/dL (ref 30.0–36.0)
MCV: 94.1 fL (ref 80.0–100.0)
Platelets: 213 K/uL (ref 150–400)
RBC: 4.91 MIL/uL (ref 4.22–5.81)
RDW: 13.9 % (ref 11.5–15.5)
WBC: 5.9 K/uL (ref 4.0–10.5)
nRBC: 0 % (ref 0.0–0.2)

## 2024-06-11 NOTE — Patient Instructions (Signed)
 There has been no changes to your medications.  Labs done today, your results will be available in MyChart, we will contact you for abnormal readings.   REPEAT blood work as scheduled.  Your physician has requested that you have an echocardiogram. Echocardiography is a painless test that uses sound waves to create images of your heart. It provides your doctor with information about the size and shape of your heart and how well your hearts chambers and valves are working. This procedure takes approximately one hour. There are no restrictions for this procedure. Please do NOT wear cologne, perfume, aftershave, or lotions (deodorant is allowed). Please arrive 15 minutes prior to your appointment time.  Please note: We ask at that you not bring children with you during ultrasound (echo/ vascular) testing. Due to room size and safety concerns, children are not allowed in the ultrasound rooms during exams. Our front office staff cannot provide observation of children in our lobby area while testing is being conducted. An adult accompanying a patient to their appointment will only be allowed in the ultrasound room at the discretion of the ultrasound technician under special circumstances. We apologize for any inconvenience.  Your physician recommends that you schedule a follow-up appointment in: 4 months ( May 2026) ** PLEASE CALL THE OFFICE IN MARCH TO ARRANGE YOUR FOLLOW UP APPOINTMENT.**  If you have any questions or concerns before your next appointment please send us  a message through [facility] or call our office at [phone].    TO LEAVE A MESSAGE FOR THE NURSE SELECT OPTION 2, PLEASE LEAVE A MESSAGE INCLUDING: YOUR NAME DATE OF BIRTH CALL BACK NUMBER REASON FOR CALL**this is important as we prioritize the call backs  YOU WILL RECEIVE A CALL BACK THE SAME DAY AS LONG AS YOU CALL BEFORE 4:00 PM  At the [facility], you and your health needs are our priority. As part of our  continuing mission to provide you with exceptional heart care, we have created designated Provider Care Teams. These Care Teams include your primary Cardiologist (physician) and Advanced Practice Providers (APPs- Physician Assistants and Nurse Practitioners) who all work together to provide you with the care you need, when you need it.   You may see any of the following providers on your designated Care Team at your next follow up: Dr Toribio Fuel Dr Ezra Shuck Dr. Morene Brownie Greig Mosses, NP Caffie Shed, [place] [facility], [place] Beckey Coe, NP Jordan Lee, NP Ellouise Class, NP Tinnie Redman, PharmD Jaun Bash, PharmD   Please be sure to bring in all your medications bottles to every appointment.    Thank you for choosing [facility]

## 2024-06-12 LAB — CK TOTAL AND CKMB (NOT AT ARMC)
CK, MB: 1.7 ng/mL (ref 0.5–5.0)
Total CK: 48 U/L — ABNORMAL LOW (ref 49–397)
Total CK: 49 U/L (ref 49–397)

## 2024-06-13 NOTE — Progress Notes (Signed)
 Remote PPM Transmission

## 2024-06-15 ENCOUNTER — Ambulatory Visit: Payer: Self-pay | Admitting: Cardiology

## 2024-06-21 ENCOUNTER — Ambulatory Visit (HOSPITAL_COMMUNITY)
Admission: RE | Admit: 2024-06-21 | Discharge: 2024-06-21 | Disposition: A | Discharge status: Home / Self Care | Source: Ambulatory Visit | Attending: Cardiology | Admitting: Cardiology

## 2024-06-21 DIAGNOSIS — I5022 Chronic systolic (congestive) heart failure: Secondary | ICD-10-CM | POA: Diagnosis present

## 2024-06-21 LAB — BASIC METABOLIC PANEL WITH GFR
Anion gap: 8 (ref 5–15)
BUN: 14 mg/dL (ref 8–23)
CO2: 25 mmol/L (ref 22–32)
Calcium: 8.7 mg/dL — ABNORMAL LOW (ref 8.9–10.3)
Chloride: 106 mmol/L (ref 98–111)
Creatinine, Ser: 1.02 mg/dL (ref 0.61–1.24)
GFR, Estimated: >60 mL/min
Glucose, Bld: 105 mg/dL — ABNORMAL HIGH (ref 70–99)
Potassium: 4.2 mmol/L (ref 3.5–5.1)
Sodium: 140 mmol/L (ref 135–145)

## 2024-06-24 ENCOUNTER — Telehealth: Payer: Self-pay

## 2024-06-24 ENCOUNTER — Ambulatory Visit: Attending: Cardiology

## 2024-06-24 DIAGNOSIS — I5022 Chronic systolic (congestive) heart failure: Secondary | ICD-10-CM | POA: Diagnosis not present

## 2024-06-24 DIAGNOSIS — Z950 Presence of cardiac pacemaker: Secondary | ICD-10-CM

## 2024-06-24 NOTE — Progress Notes (Signed)
 EPIC Encounter for ICM Monitoring  Patient Name: Joshua Moran. is a 79 y.o. male Date: 06/24/2024 Primary Care Physican: Okey Carlin Redbird, MD Primary Cardiologist: Rolan Electrophysiologist: Kennyth BiV Pacing: >99% 10/19/2022 Weight: 279 lbs (276 lbs) 07/13/2023 Weight: 282 lbs     08/16/2023 Weight: 284 lbs   09/18/2023 Weight: 283 lbs   12/18/2023 Weight: 282.7 lbs  (was 295.2 lbs)    12/27/2023 Weight: 279.7 lbs    01/04/2024 Weight: 279 lbs     03/06/2024 Weight: 276.8 lbs          06/11/2024 Office Weight: 287 lbs                        Attempted call to patient and unable to reach.  Left detailed message per DPR regarding transmission.  Transmission results reviewed.       Diet:  He eats at restaurants several times a week and aware the foods are higher in salt.     Since 05/20/2024 ICM Remote Transmission: [facility] thoracic impedance suggesting normal fluid levels.     Prescribed:  Furosemide  20 mg take 1 tablet(s) (20 mg total) by mouth daily + may take an extra tablet daily if needed for swelling or weight gain of 3 pounds overnight or 5 pounds in a week.   Labs: 06/21/2024 Creatinine 1.02, BUN 14, Potassium 4.2, Sodium 140, GFR >60 06/11/2024 Creatinine 0.91, BUN 13, Potassium 4.4, Sodium 139, GFR >60 03/11/2024 Creatinine 1.09, BUN 12, Potassium 4.2, Sodium 139, GFR >60 01/31/2024 Creatinine 1.02, BUN 14, Potassium 4.2, Sodium 138, GFR >60 12/26/2023 Creatinine 1.05, BUN 16, Potassium 4.6, Sodium 139, GFR >60 12/13/2023 Creatinine 0.92, BUN 14, Potassium 4.1, Sodium 138, GFR >60, BNP 124.2 11/23/2023 Creatinine 1.13, BUN 15. Potassium 4.8, Sodium 142, GFR >60 11/13/2023 BNP 128.2 A complete set of results can be found in Results Review.   Recommendations:   Left voice mail with ICM number and encouraged to call if experiencing any fluid symptoms.   Follow-up plan: ICM clinic phone appointment on 07/25/2024.   91 day device clinic remote transmission 09/09/2024.      EP/Cardiology Office Visits:    None scheduled   Copy of ICM check sent to Dr. Kennyth.    Remote monitoring is medically necessary for Heart Failure Management.    Daily Thoracic Impedance ICM trend: 03/26/2024 through 06/24/2024.    12-14 Month Thoracic Impedance ICM trend:     [name], RN 06/24/2024 2:23 PM

## 2024-06-24 NOTE — Telephone Encounter (Signed)
 Remote ICM transmission received.  Attempted call to patient regarding ICM remote transmission.  Left detailed message per DPR with ICM phone number to return call for any questions, concerns or fluid symptoms.

## 2024-06-25 ENCOUNTER — Encounter: Payer: Self-pay | Admitting: Internal Medicine

## 2024-07-04 NOTE — Progress Notes (Signed)
 31 day ICM Remote transmission canceled due to [facility] clinic is on hold until further notice.  91 day remote monitoring will continue per protocol.

## 2024-07-05 ENCOUNTER — Other Ambulatory Visit (HOSPITAL_COMMUNITY): Payer: Self-pay | Admitting: Cardiology

## 2024-07-25 ENCOUNTER — Ambulatory Visit

## 2024-09-09 ENCOUNTER — Encounter

## 2024-12-09 ENCOUNTER — Encounter

## 2025-03-10 ENCOUNTER — Encounter

## 2025-06-09 ENCOUNTER — Encounter

## 2025-09-08 ENCOUNTER — Encounter

## 2025-12-08 ENCOUNTER — Encounter
